# Patient Record
Sex: Male | Born: 1960
Health system: Southern US, Community
[De-identification: ages and names within clinical notes are randomized; demographics above are authoritative.]

## PROBLEM LIST (undated history)

## (undated) DIAGNOSIS — L0291 Cutaneous abscess, unspecified: Secondary | ICD-10-CM

## (undated) DIAGNOSIS — J45909 Unspecified asthma, uncomplicated: Secondary | ICD-10-CM

## (undated) DIAGNOSIS — I639 Cerebral infarction, unspecified: Secondary | ICD-10-CM

## (undated) DIAGNOSIS — E785 Hyperlipidemia, unspecified: Secondary | ICD-10-CM

## (undated) DIAGNOSIS — F419 Anxiety disorder, unspecified: Secondary | ICD-10-CM

## (undated) DIAGNOSIS — F329 Major depressive disorder, single episode, unspecified: Secondary | ICD-10-CM

## (undated) DIAGNOSIS — F32A Depression, unspecified: Secondary | ICD-10-CM

## (undated) DIAGNOSIS — J449 Chronic obstructive pulmonary disease, unspecified: Secondary | ICD-10-CM

## (undated) DIAGNOSIS — G500 Trigeminal neuralgia: Secondary | ICD-10-CM

## (undated) DIAGNOSIS — R42 Dizziness and giddiness: Secondary | ICD-10-CM

## (undated) DIAGNOSIS — M1990 Unspecified osteoarthritis, unspecified site: Secondary | ICD-10-CM

## (undated) DIAGNOSIS — G8929 Other chronic pain: Secondary | ICD-10-CM

## (undated) DIAGNOSIS — K120 Recurrent oral aphthae: Secondary | ICD-10-CM

## (undated) DIAGNOSIS — I10 Essential (primary) hypertension: Secondary | ICD-10-CM

## (undated) HISTORY — DX: Essential (primary) hypertension: I10

## (undated) HISTORY — DX: Recurrent oral aphthae: K12.0

## (undated) HISTORY — DX: Dizziness and giddiness: R42

## (undated) HISTORY — DX: Major depressive disorder, single episode, unspecified: F32.9

## (undated) HISTORY — DX: Anxiety disorder, unspecified: F41.9

## (undated) HISTORY — PX: TRIGEMINAL NERVE DECOMPRESSION: SHX2579

## (undated) HISTORY — PX: HAND RECONSTRUCTION: SHX1730

## (undated) HISTORY — DX: Cutaneous abscess, unspecified: L02.91

## (undated) HISTORY — DX: Cerebral infarction, unspecified: I63.9

## (undated) HISTORY — PX: CHOLECYSTECTOMY: SHX55

## (undated) HISTORY — DX: Depression, unspecified: F32.A

## (undated) HISTORY — DX: Other chronic pain: G89.29

## (undated) HISTORY — DX: Trigeminal neuralgia: G50.0

## (undated) HISTORY — DX: Hyperlipidemia, unspecified: E78.5

---

## 2010-07-21 ENCOUNTER — Encounter: Payer: Self-pay | Admitting: Physician Assistant

## 2011-05-08 DIAGNOSIS — J45909 Unspecified asthma, uncomplicated: Secondary | ICD-10-CM | POA: Diagnosis not present

## 2011-05-08 DIAGNOSIS — J019 Acute sinusitis, unspecified: Secondary | ICD-10-CM | POA: Diagnosis not present

## 2011-08-07 DIAGNOSIS — I10 Essential (primary) hypertension: Secondary | ICD-10-CM | POA: Diagnosis not present

## 2011-08-07 DIAGNOSIS — R5381 Other malaise: Secondary | ICD-10-CM | POA: Diagnosis not present

## 2011-08-07 DIAGNOSIS — R5383 Other fatigue: Secondary | ICD-10-CM | POA: Diagnosis not present

## 2011-09-18 DIAGNOSIS — J209 Acute bronchitis, unspecified: Secondary | ICD-10-CM | POA: Diagnosis not present

## 2011-11-03 DIAGNOSIS — Z23 Encounter for immunization: Secondary | ICD-10-CM | POA: Diagnosis not present

## 2011-11-08 DIAGNOSIS — E039 Hypothyroidism, unspecified: Secondary | ICD-10-CM | POA: Diagnosis not present

## 2011-11-08 DIAGNOSIS — E559 Vitamin D deficiency, unspecified: Secondary | ICD-10-CM | POA: Diagnosis not present

## 2011-11-08 DIAGNOSIS — M542 Cervicalgia: Secondary | ICD-10-CM | POA: Diagnosis not present

## 2011-11-08 DIAGNOSIS — R5381 Other malaise: Secondary | ICD-10-CM | POA: Diagnosis not present

## 2011-11-08 DIAGNOSIS — R5383 Other fatigue: Secondary | ICD-10-CM | POA: Diagnosis not present

## 2011-11-08 DIAGNOSIS — Z125 Encounter for screening for malignant neoplasm of prostate: Secondary | ICD-10-CM | POA: Diagnosis not present

## 2011-11-08 DIAGNOSIS — E785 Hyperlipidemia, unspecified: Secondary | ICD-10-CM | POA: Diagnosis not present

## 2011-11-08 DIAGNOSIS — I10 Essential (primary) hypertension: Secondary | ICD-10-CM | POA: Diagnosis not present

## 2011-12-26 DIAGNOSIS — R609 Edema, unspecified: Secondary | ICD-10-CM | POA: Diagnosis not present

## 2012-02-08 DIAGNOSIS — M545 Low back pain: Secondary | ICD-10-CM | POA: Diagnosis not present

## 2012-02-08 DIAGNOSIS — F411 Generalized anxiety disorder: Secondary | ICD-10-CM | POA: Diagnosis not present

## 2012-04-15 DIAGNOSIS — J019 Acute sinusitis, unspecified: Secondary | ICD-10-CM | POA: Diagnosis not present

## 2012-05-10 ENCOUNTER — Ambulatory Visit (INDEPENDENT_AMBULATORY_CARE_PROVIDER_SITE_OTHER): Payer: Medicare Other | Admitting: Physician Assistant

## 2012-05-10 ENCOUNTER — Encounter: Payer: Self-pay | Admitting: Physician Assistant

## 2012-05-10 VITALS — BP 138/85 | HR 54 | Temp 97.9°F | Ht 69.5 in | Wt 249.0 lb

## 2012-05-10 DIAGNOSIS — J45901 Unspecified asthma with (acute) exacerbation: Secondary | ICD-10-CM

## 2012-05-10 DIAGNOSIS — G500 Trigeminal neuralgia: Secondary | ICD-10-CM | POA: Diagnosis not present

## 2012-05-10 DIAGNOSIS — I10 Essential (primary) hypertension: Secondary | ICD-10-CM

## 2012-05-10 DIAGNOSIS — J309 Allergic rhinitis, unspecified: Secondary | ICD-10-CM | POA: Diagnosis not present

## 2012-05-10 DIAGNOSIS — F4323 Adjustment disorder with mixed anxiety and depressed mood: Secondary | ICD-10-CM

## 2012-05-10 DIAGNOSIS — S0431XS Injury of trigeminal nerve, right side, sequela: Secondary | ICD-10-CM

## 2012-05-10 MED ORDER — METHYLPREDNISOLONE ACETATE 80 MG/ML IJ SUSP
80.0000 mg | Freq: Once | INTRAMUSCULAR | Status: AC
  Administered 2012-05-10: 80 mg via INTRAMUSCULAR

## 2012-05-10 MED ORDER — HYDROCODONE-ACETAMINOPHEN 5-300 MG PO TABS: 5.0000 mg | ORAL_TABLET | Freq: Two times a day (BID) | ORAL | Status: DC | PRN

## 2012-05-10 MED ORDER — ALBUTEROL SULFATE HFA 108 (90 BASE) MCG/ACT IN AERS: 2.0000 | INHALATION_SPRAY | RESPIRATORY_TRACT | Status: DC | PRN

## 2012-05-10 NOTE — Progress Notes (Signed)
Tolerated well

## 2012-05-11 DIAGNOSIS — F4323 Adjustment disorder with mixed anxiety and depressed mood: Secondary | ICD-10-CM | POA: Insufficient documentation

## 2012-05-11 DIAGNOSIS — I10 Essential (primary) hypertension: Secondary | ICD-10-CM | POA: Insufficient documentation

## 2012-05-11 DIAGNOSIS — G500 Trigeminal neuralgia: Secondary | ICD-10-CM | POA: Insufficient documentation

## 2012-05-11 NOTE — Progress Notes (Signed)
  Subjective:    Patient ID: Matthew [name], male    DOB: [birth year], 52 y.o.   MRN: [id number]  HPI 3 month follow up on anxiety, trigeminal neuralgia pain,  Head congestion x3days, cough, shortness of breath    Review of Systems  Constitutional: Positive for fatigue.  HENT: Positive for ear pain, congestion, facial swelling, rhinorrhea, postnasal drip and sinus pressure. Negative for sneezing.   Eyes: Positive for itching.  Respiratory: Positive for cough and shortness of breath.   All other systems reviewed and are negative.       Objective:   Physical Exam  Vitals reviewed. Constitutional: He is oriented to person, place, and time. He appears well-developed and well-nourished.  HENT:  Head: Normocephalic and atraumatic.  Right Ear: External ear normal.  Left Ear: External ear normal.  Mouth/Throat: Oropharynx is clear and moist.  Right sided head neurological pain R>L TM retraction R>L nasal hypertrophy  Eyes: Conjunctivae are normal. Pupils are equal, round, and reactive to light.  Neck: Normal range of motion. Neck supple.  Cardiovascular: Normal rate, regular rhythm and normal heart sounds.   Pulmonary/Chest: Effort normal. No respiratory distress. He has wheezes. He has no rales.  Abdominal: Soft. Bowel sounds are normal.  Musculoskeletal: Normal range of motion.  Neurological: He is oriented to person, place, and time.  Skin: Skin is warm and dry.  Psychiatric: He has a normal mood and affect. His behavior is normal. Judgment and thought content normal.          Assessment & Plan:  Asthma with acute exacerbation - Plan: albuterol (PROAIR HFA) 108 (90 BASE) MCG/ACT inhaler  Trigeminal (5th) nerve injury, right, sequela - Plan: Hydrocodone-Acetaminophen 5-300 MG TABS  Allergic rhinitis - Plan: methylPREDNISolone acetate (DEPO-MEDROL) injection 80 mg  Adjustment disorder with mixed anxiety and depressed mood  Trigeminal neuralgia  HTN (hypertension)

## 2012-06-12 ENCOUNTER — Other Ambulatory Visit: Payer: Self-pay | Admitting: Nurse Practitioner

## 2012-06-17 ENCOUNTER — Ambulatory Visit (INDEPENDENT_AMBULATORY_CARE_PROVIDER_SITE_OTHER): Payer: Medicare Other | Admitting: General Practice

## 2012-06-17 VITALS — BP 148/84 | HR 52 | Temp 97.5°F

## 2012-06-17 DIAGNOSIS — S0431XS Injury of trigeminal nerve, right side, sequela: Secondary | ICD-10-CM

## 2012-06-17 DIAGNOSIS — S049XXS Injury of unspecified cranial nerve, sequela: Secondary | ICD-10-CM

## 2012-06-17 MED ORDER — HYDROCODONE-ACETAMINOPHEN 5-300 MG PO TABS: 5.0000 mg | ORAL_TABLET | Freq: Two times a day (BID) | ORAL | Status: DC | PRN

## 2012-06-17 NOTE — Patient Instructions (Addendum)
Trigeminal Neuralgia Trigeminal neuralgia is a nerve disorder that causes sudden attacks of severe facial pain. It is caused by damage to the trigeminal nerve, a major nerve in the face. It is more common in women and in the elderly, although it can also happen in younger patients. Attacks last from a few seconds to several minutes and can occur from a couple of times per year to several times per day. Trigeminal neuralgia can be a very distressing and disabling condition. Surgery may be needed in very severe cases if medical treatment does not give relief. HOME CARE INSTRUCTIONS   If your caregiver prescribed medication to help prevent attacks, take as directed.  To help prevent attacks:  Chew on the unaffected side of the mouth.  Avoid touching your face.  Avoid blasts of hot or cold air.  Men may wish to grow a beard to avoid having to shave. SEEK IMMEDIATE MEDICAL CARE IF:  Pain is unbearable and your medicine does not help.  You develop new, unexplained symptoms (problems).  You have problems that may be related to a medication you are taking. Document Released: 01/28/2000 Document Revised: 04/24/2011 Document Reviewed: 11/27/2008 [employer] Patient Information 2013 [employer].  

## 2012-06-17 NOTE — Progress Notes (Signed)
  Subjective:    Patient ID: Matthew [name], male    DOB: Sep 08, 1960, 52 y.o.   MRN: [id number]  HPI Reports today for medication refill. Hydrocodone/Acetaminophen 5-325 1 tablet every 12-24 hours as needed. Reports pain in legs and trigeminal neuralgia. Reports pain medications relieves pain completely. Reports taking medications as prescribed.     Review of Systems  Constitutional: Negative for fever and chills.  HENT:       Right facial pain  Respiratory: Negative for chest tightness and shortness of breath.   Cardiovascular: Negative for chest pain.  Genitourinary: Negative for difficulty urinating.  Musculoskeletal:       Bilateral leg pain  Neurological: Positive for headaches. Negative for dizziness.       From trigeminal neuralgia        Objective:   Physical Exam  Constitutional: He appears well-developed and well-nourished.  Cardiovascular: Normal rate, regular rhythm and normal heart sounds.   No murmur heard. Pulmonary/Chest: Effort normal and breath sounds normal. No respiratory distress. He exhibits no tenderness.  Neurological: He is alert.  Skin: Skin is warm and dry.  Psychiatric: He has a normal mood and affect.          Assessment & Plan:  Take medications as prescribed Schedule 3 month follow up  Continue current medications Patient verbalized understanding Raymon Mutton, FNP-C

## 2012-07-15 ENCOUNTER — Ambulatory Visit: Payer: Medicare Other | Admitting: General Practice

## 2012-07-16 ENCOUNTER — Other Ambulatory Visit: Payer: Self-pay | Admitting: Family Medicine

## 2012-07-18 NOTE — Telephone Encounter (Signed)
LAST OV 06/17/12. LAST RF 06/17/12. PLEASE PRINT CALL PT WHEN READY

## 2012-07-19 NOTE — Telephone Encounter (Signed)
Pt concerned about his pain rx  was due yesterday 6/5  [facility] on [facility]/16/12 Please advise

## 2012-07-23 NOTE — Telephone Encounter (Signed)
Done yesterday.

## 2012-08-13 ENCOUNTER — Other Ambulatory Visit: Payer: Self-pay | Admitting: Nurse Practitioner

## 2012-08-13 ENCOUNTER — Ambulatory Visit (INDEPENDENT_AMBULATORY_CARE_PROVIDER_SITE_OTHER): Payer: Medicare Other | Admitting: Family Medicine

## 2012-08-13 VITALS — BP 202/120 | HR 80 | Temp 97.4°F | Ht 69.5 in | Wt 250.0 lb

## 2012-08-13 DIAGNOSIS — I160 Hypertensive urgency: Secondary | ICD-10-CM

## 2012-08-13 DIAGNOSIS — I10 Essential (primary) hypertension: Secondary | ICD-10-CM

## 2012-08-13 DIAGNOSIS — R6889 Other general symptoms and signs: Secondary | ICD-10-CM | POA: Diagnosis not present

## 2012-08-13 DIAGNOSIS — Z79899 Other long term (current) drug therapy: Secondary | ICD-10-CM | POA: Diagnosis not present

## 2012-08-13 DIAGNOSIS — M7989 Other specified soft tissue disorders: Secondary | ICD-10-CM | POA: Diagnosis not present

## 2012-08-13 LAB — POCT URINALYSIS DIPSTICK
Blood, UA: NEGATIVE
Glucose, UA: NEGATIVE
Ketones, UA: NEGATIVE
Protein, UA: NEGATIVE
Spec Grav, UA: <=1.005
Urobilinogen, UA: NEGATIVE

## 2012-08-13 NOTE — Progress Notes (Signed)
  Subjective:    Patient ID: Matthew [name], male    DOB: [birth year], 52 y.o.   MRN: [id number]  HPI Pt presents with LE edema over past 2 weeks  Pt presents today with BP of 200/100 in bilateral arms on recheck.  NO HA, CP, SOB.  Has had some weakness Had dx of HTN many years ago.  Currently not on medication.  Has been taking NSAIDs.  Has been under stress.  No recent infections or trauma.  No ETOH abuse 1 PPD smoker.    Review of Systems  All other systems reviewed and are negative.       Objective:   Physical Exam  Constitutional: No distress.  Obese    HENT:  Head: Normocephalic and atraumatic.  Eyes: Conjunctivae are normal. Pupils are equal, round, and reactive to light.  Neck: Normal range of motion.  Cardiovascular: Normal rate.   Pulmonary/Chest: Effort normal and breath sounds normal.  Abdominal:  Obese abdomen, non tender    Musculoskeletal:  2-3 + pitting edema in LEs bilaterally    Neurological: He is alert.  Skin: Skin is warm.   EKG: RBBB, no acute TW abnormalities      Assessment & Plan:  Some concern for hypertensive emergency given BP and LE swelling (? HTN induced renal renal failure).  EKG w/ RBBB. Unclear if this is old vs. New.  Will give full dose ASA in the interim.  Will send pt to ER via EMS for further evaluation.

## 2012-08-15 ENCOUNTER — Other Ambulatory Visit: Payer: Self-pay | Admitting: *Deleted

## 2012-08-15 MED ORDER — PREGABALIN 200 MG PO CAPS: 200.0000 mg | ORAL_CAPSULE | Freq: Every day | ORAL | Status: DC

## 2012-08-15 MED ORDER — PREGABALIN 100 MG PO CAPS: 100.0000 mg | ORAL_CAPSULE | Freq: Three times a day (TID) | ORAL | Status: DC

## 2012-08-15 MED ORDER — HYDROCODONE-ACETAMINOPHEN 5-300 MG PO TABS: ORAL_TABLET | ORAL | Status: DC

## 2012-08-15 MED ORDER — TRAMADOL HCL 50 MG PO TABS: 50.0000 mg | ORAL_TABLET | Freq: Three times a day (TID) | ORAL | Status: DC | PRN

## 2012-08-15 NOTE — Telephone Encounter (Signed)
Last seen 08/13/12 by you, last filled 12/13 with 5 refills. If approved route to your nurse to call in at [facility]

## 2012-08-15 NOTE — Telephone Encounter (Addendum)
Patient last seen in office on 06-17-12. Rx for hydrocodone last filled on 6-9 for #60. Rx for lyrica 100 and 200 last filled on 07-16-12 for #90. Please advise. If approved please print and have pt pick up

## 2012-08-19 ENCOUNTER — Ambulatory Visit (INDEPENDENT_AMBULATORY_CARE_PROVIDER_SITE_OTHER): Payer: Medicare Other | Admitting: Family Medicine

## 2012-08-19 ENCOUNTER — Other Ambulatory Visit: Payer: Self-pay | Admitting: Nurse Practitioner

## 2012-08-19 ENCOUNTER — Encounter: Payer: Self-pay | Admitting: Family Medicine

## 2012-08-19 VITALS — BP 130/98 | HR 70 | Temp 97.7°F | Ht 69.5 in | Wt 254.0 lb

## 2012-08-19 DIAGNOSIS — Z79899 Other long term (current) drug therapy: Secondary | ICD-10-CM | POA: Diagnosis not present

## 2012-08-19 DIAGNOSIS — G894 Chronic pain syndrome: Secondary | ICD-10-CM | POA: Insufficient documentation

## 2012-08-19 DIAGNOSIS — J441 Chronic obstructive pulmonary disease with (acute) exacerbation: Secondary | ICD-10-CM | POA: Diagnosis not present

## 2012-08-19 DIAGNOSIS — G500 Trigeminal neuralgia: Secondary | ICD-10-CM | POA: Diagnosis not present

## 2012-08-19 DIAGNOSIS — M129 Arthropathy, unspecified: Secondary | ICD-10-CM

## 2012-08-19 DIAGNOSIS — I10 Essential (primary) hypertension: Secondary | ICD-10-CM | POA: Diagnosis not present

## 2012-08-19 DIAGNOSIS — M1990 Unspecified osteoarthritis, unspecified site: Secondary | ICD-10-CM

## 2012-08-19 LAB — POCT CBC
Granulocyte percent: 75.9 %G (ref 37–80)
HCT, POC: 47.2 % (ref 43.5–53.7)
Hemoglobin: 16.5 g/dL (ref 14.1–18.1)
Lymph, poc: 2.4 (ref 0.6–3.4)
MCH, POC: 31.1 pg (ref 27–31.2)
MCHC: 35 g/dL (ref 31.8–35.4)
MCV: 88.8 fL (ref 80–97)
MPV: 8.7 fL (ref 0–99.8)
POC Granulocyte: 8.4 — AB (ref 2–6.9)
POC LYMPH PERCENT: 21.5 %L (ref 10–50)
Platelet Count, POC: 173 10*3/uL (ref 142–424)
RBC: 5.3 M/uL (ref 4.69–6.13)
RDW, POC: 12.7 %
WBC: 11.1 10*3/uL — AB (ref 4.6–10.2)

## 2012-08-19 MED ORDER — PREGABALIN 200 MG PO CAPS: 200.0000 mg | ORAL_CAPSULE | Freq: Every day | ORAL | Status: DC

## 2012-08-19 MED ORDER — TRIAMTERENE-HCTZ 37.5-25 MG PO CAPS: 1.0000 | ORAL_CAPSULE | ORAL | Status: DC

## 2012-08-19 MED ORDER — HYDROCODONE-ACETAMINOPHEN 5-300 MG PO TABS: ORAL_TABLET | ORAL | Status: DC

## 2012-08-19 MED ORDER — CARBAMAZEPINE 200 MG PO TABS: 200.0000 mg | ORAL_TABLET | Freq: Four times a day (QID) | ORAL | Status: DC

## 2012-08-19 MED ORDER — ALBUTEROL SULFATE HFA 108 (90 BASE) MCG/ACT IN AERS: 2.0000 | INHALATION_SPRAY | RESPIRATORY_TRACT | Status: DC | PRN

## 2012-08-19 NOTE — Addendum Note (Signed)
Addended by: Prescott Gum on: 08/19/2012 04:39 PM   Modules accepted: Orders

## 2012-08-19 NOTE — Patient Instructions (Addendum)

## 2012-08-19 NOTE — Addendum Note (Signed)
Addended by: Orma Render F on: 08/19/2012 03:02 PM   Modules accepted: Orders

## 2012-08-19 NOTE — Progress Notes (Signed)
  Subjective:    Patient ID: Matthew [name], male    DOB: [birth year], 52 y.o.   MRN: [id number]  HPI  This 52 y.o. male presents for evaluation of hypertension.  He was seen last week for hypertensive emergency and he was seen in the ED at [facility] ED and he was rx'd xanax which he states didn't help and he resumed his tranxene which is helping.  He states he was having anxiety and panic attack and was not taking the tranxene.  He is needing refills on his medications. He has hx of trigeminal neuralgia has seen neurology in the past but is not currently seeing, he has hx of chronic pain, arthritis, HTN, and obesity.  Review of Systems No chest pain, SOB, HA, dizziness, vision change, N/V, diarrhea, constipation, dysuria, urinary urgency or frequency, myalgias, arthralgias or rash.     Objective:   Physical Exam Vital signs noted  Well developed well nourished male.  HEENT - Head atraumatic Normocephalic                Eyes - PERRLA, Conjuctiva - clear Sclera- Clear EOMI                Ears - EAC's Wnl TM's Wnl Gross Hearing WNL                Nose - Nares patent                 Throat - oropharanx wnl Respiratory - Lungs CTA bilateral Cardiac - RRR S1 and S2 without murmur GI - Abdomen soft Nontender and bowel sounds active x 4 Extremities - No edema. Neuro - Grossly intact.       Assessment & Plan:  Chronic pain syndrome - Plan: pregabalin (LYRICA) 200 MG capsule, Hydrocodone-Acetaminophen 5-300 MG TABS, Drug Screen, Urine.  He will follow up with clinical pharmacist for pain contract and management etc.  Explained that he will be rx'd the lortab and needs to follow up for refill.  Explained he will need to submit UDS for chronic narcotic pan medications.  He   Arthritis - Explained he can take aleve otc.  Essential hypertension, benign - Plan: triamterene-hydrochlorothiazide (DYAZIDE) 37.5-25 MG per capsule, POCT CBC, BASIC METABOLIC PANEL WITH GFR.  BP under better control. Follow  up in one month and prn.  Trigeminal neuralgia - Plan: carbamazepine (TEGRETOL) 200 MG tablet, Carbamazepine level, free, POCT CBC, BASIC METABOLIC PANEL WITH GFR  COPD exacerbation - Plan: albuterol (PROAIR HFA) 108 (90 BASE) MCG/ACT inhaler, discussed need to quit smoking.

## 2012-08-19 NOTE — Telephone Encounter (Signed)
This was refilled today at his visit.

## 2012-08-20 LAB — PRESCRIPTION ABUSE MONITORING 17P, URINE
Barbiturate Screen, Urine: NEGATIVE ng/mL
Cocaine Metabolites: NEGATIVE ng/mL
Fentanyl, Ur: NEGATIVE ng/mL
MDMA URINE: NEGATIVE ng/mL
Meperidine, Ur: NEGATIVE ng/mL
Tapentadol, urine: NEGATIVE ng/mL
Tramadol Scrn, Ur: NEGATIVE ng/mL
Zolpidem, Urine: NEGATIVE ng/mL

## 2012-08-20 LAB — BASIC METABOLIC PANEL WITH GFR
BUN: 10 mg/dL (ref 6–23)
CO2: 28 mEq/L (ref 19–32)
Calcium: 9.7 mg/dL (ref 8.4–10.5)
Chloride: 96 mEq/L (ref 96–112)
Creat: 0.78 mg/dL (ref 0.50–1.35)
GFR, Est African American: >89 mL/min
GFR, Est Non African American: >89 mL/min
Glucose, Bld: 99 mg/dL (ref 70–99)
Potassium: 4.5 mEq/L (ref 3.5–5.3)
Sodium: 132 mEq/L — ABNORMAL LOW (ref 135–145)

## 2012-08-21 ENCOUNTER — Inpatient Hospital Stay (HOSPITAL_COMMUNITY)
Admission: EM | Admit: 2012-08-21 | Discharge: 2012-08-23 | DRG: 153 | Disposition: A | Discharge status: Home / Self Care | Payer: Medicare Other | Attending: Internal Medicine | Admitting: Internal Medicine

## 2012-08-21 ENCOUNTER — Encounter (HOSPITAL_COMMUNITY): Payer: Self-pay | Admitting: *Deleted

## 2012-08-21 ENCOUNTER — Observation Stay (HOSPITAL_COMMUNITY): Payer: Medicare Other

## 2012-08-21 ENCOUNTER — Emergency Department (HOSPITAL_COMMUNITY): Payer: Medicare Other

## 2012-08-21 DIAGNOSIS — F329 Major depressive disorder, single episode, unspecified: Secondary | ICD-10-CM | POA: Diagnosis present

## 2012-08-21 DIAGNOSIS — S0990XA Unspecified injury of head, initial encounter: Secondary | ICD-10-CM | POA: Diagnosis not present

## 2012-08-21 DIAGNOSIS — K2970 Gastritis, unspecified, without bleeding: Secondary | ICD-10-CM | POA: Diagnosis not present

## 2012-08-21 DIAGNOSIS — G500 Trigeminal neuralgia: Secondary | ICD-10-CM | POA: Diagnosis not present

## 2012-08-21 DIAGNOSIS — F411 Generalized anxiety disorder: Secondary | ICD-10-CM | POA: Diagnosis present

## 2012-08-21 DIAGNOSIS — J020 Streptococcal pharyngitis: Secondary | ICD-10-CM | POA: Diagnosis not present

## 2012-08-21 DIAGNOSIS — R531 Weakness: Secondary | ICD-10-CM

## 2012-08-21 DIAGNOSIS — G40909 Epilepsy, unspecified, not intractable, without status epilepticus: Secondary | ICD-10-CM | POA: Diagnosis present

## 2012-08-21 DIAGNOSIS — F4323 Adjustment disorder with mixed anxiety and depressed mood: Secondary | ICD-10-CM

## 2012-08-21 DIAGNOSIS — R6889 Other general symptoms and signs: Secondary | ICD-10-CM | POA: Diagnosis not present

## 2012-08-21 DIAGNOSIS — D72829 Elevated white blood cell count, unspecified: Secondary | ICD-10-CM | POA: Diagnosis present

## 2012-08-21 DIAGNOSIS — E871 Hypo-osmolality and hyponatremia: Secondary | ICD-10-CM

## 2012-08-21 DIAGNOSIS — J449 Chronic obstructive pulmonary disease, unspecified: Secondary | ICD-10-CM | POA: Diagnosis present

## 2012-08-21 DIAGNOSIS — M25569 Pain in unspecified knee: Secondary | ICD-10-CM | POA: Diagnosis not present

## 2012-08-21 DIAGNOSIS — E785 Hyperlipidemia, unspecified: Secondary | ICD-10-CM | POA: Diagnosis present

## 2012-08-21 DIAGNOSIS — F17200 Nicotine dependence, unspecified, uncomplicated: Secondary | ICD-10-CM | POA: Diagnosis present

## 2012-08-21 DIAGNOSIS — W010XXA Fall on same level from slipping, tripping and stumbling without subsequent striking against object, initial encounter: Secondary | ICD-10-CM | POA: Diagnosis present

## 2012-08-21 DIAGNOSIS — R1110 Vomiting, unspecified: Secondary | ICD-10-CM | POA: Diagnosis not present

## 2012-08-21 DIAGNOSIS — J4489 Other specified chronic obstructive pulmonary disease: Secondary | ICD-10-CM | POA: Diagnosis present

## 2012-08-21 DIAGNOSIS — I10 Essential (primary) hypertension: Secondary | ICD-10-CM | POA: Diagnosis not present

## 2012-08-21 DIAGNOSIS — F3289 Other specified depressive episodes: Secondary | ICD-10-CM | POA: Diagnosis present

## 2012-08-21 DIAGNOSIS — J438 Other emphysema: Secondary | ICD-10-CM | POA: Diagnosis not present

## 2012-08-21 DIAGNOSIS — S8990XA Unspecified injury of unspecified lower leg, initial encounter: Secondary | ICD-10-CM | POA: Diagnosis not present

## 2012-08-21 DIAGNOSIS — T1490XA Injury, unspecified, initial encounter: Secondary | ICD-10-CM | POA: Diagnosis present

## 2012-08-21 DIAGNOSIS — G894 Chronic pain syndrome: Secondary | ICD-10-CM

## 2012-08-21 HISTORY — DX: Unspecified osteoarthritis, unspecified site: M19.90

## 2012-08-21 HISTORY — DX: Chronic obstructive pulmonary disease, unspecified: J44.9

## 2012-08-21 HISTORY — DX: Unspecified asthma, uncomplicated: J45.909

## 2012-08-21 LAB — CBC WITH DIFFERENTIAL/PLATELET
Basophils Absolute: 0 10*3/uL (ref 0.0–0.1)
Basophils Relative: 0 % (ref 0–1)
Eosinophils Absolute: 0 10*3/uL (ref 0.0–0.7)
Eosinophils Relative: 0 % (ref 0–5)
Lymphs Abs: 0.7 10*3/uL (ref 0.7–4.0)
MCH: 31.7 pg (ref 26.0–34.0)
MCV: 87.4 fL (ref 78.0–100.0)
Neutrophils Relative %: 90 % — ABNORMAL HIGH (ref 43–77)
Platelets: 141 10*3/uL — ABNORMAL LOW (ref 150–400)
RBC: 4.86 MIL/uL (ref 4.22–5.81)
RDW: 12.7 % (ref 11.5–15.5)

## 2012-08-21 LAB — COMPREHENSIVE METABOLIC PANEL
ALT: 20 U/L (ref 0–53)
AST: 13 U/L (ref 0–37)
Albumin: 3.5 g/dL (ref 3.5–5.2)
Alkaline Phosphatase: 111 U/L (ref 39–117)
Calcium: 8.7 mg/dL (ref 8.4–10.5)
GFR calc Af Amer: >90 mL/min (ref >90)
Potassium: 3.7 mEq/L (ref 3.5–5.1)
Sodium: 127 mEq/L — ABNORMAL LOW (ref 135–145)
Total Protein: 7.5 g/dL (ref 6.0–8.3)

## 2012-08-21 LAB — URINALYSIS, ROUTINE W REFLEX MICROSCOPIC
Hgb urine dipstick: NEGATIVE
Ketones, ur: NEGATIVE mg/dL
Specific Gravity, Urine: 1.02 (ref 1.005–1.030)
Urobilinogen, UA: 0.2 mg/dL (ref 0.0–1.0)

## 2012-08-21 LAB — RAPID URINE DRUG SCREEN, HOSP PERFORMED
Amphetamines: NOT DETECTED
Barbiturates: NOT DETECTED
Benzodiazepines: POSITIVE — AB
Cocaine: NOT DETECTED
Opiates: NOT DETECTED
Tetrahydrocannabinol: NOT DETECTED

## 2012-08-21 LAB — OPIATES/OPIOIDS (LC/MS-MS)
Codeine Urine: NEGATIVE ng/mL
Hydromorphone: NEGATIVE ng/mL
Morphine Urine: NEGATIVE ng/mL
Norhydrocodone, Ur: 393 ng/mL
Oxycodone, ur: NEGATIVE ng/mL

## 2012-08-21 LAB — BENZODIAZEPINES (GC/LC/MS), URINE
Alprazolam metabolite (GC/LC/MS), ur confirm: NEGATIVE ng/mL
Diazepam (GC/LC/MS), ur confirm: NEGATIVE ng/mL
Estazolam (GC/LC/MS), ur confirm: NEGATIVE ng/mL
Midazolam (GC/LC/MS), ur confirm: NEGATIVE ng/mL
Oxazepam (GC/LC/MS), ur confirm: 2056 ng/mL
Triazolam metabolite (GC/LC/MS), ur confirm: NEGATIVE ng/mL

## 2012-08-21 LAB — URINE MICROSCOPIC-ADD ON

## 2012-08-21 LAB — CBC
HCT: 41.7 % (ref 39.0–52.0)
MCH: 30.6 pg (ref 26.0–34.0)
MCV: 89.1 fL (ref 78.0–100.0)
Platelets: 146 10*3/uL — ABNORMAL LOW (ref 150–400)
RDW: 13 % (ref 11.5–15.5)

## 2012-08-21 MED ORDER — PREGABALIN 50 MG PO CAPS
100.0000 mg | ORAL_CAPSULE | Freq: Three times a day (TID) | ORAL | Status: DC
  Administered 2012-08-21 – 2012-08-23 (×5): 100 mg via ORAL
  Filled 2012-08-21 (×7): qty 2

## 2012-08-21 MED ORDER — PNEUMOCOCCAL VAC POLYVALENT 25 MCG/0.5ML IJ INJ
0.5000 mL | INJECTION | INTRAMUSCULAR | Status: AC
  Administered 2012-08-22: 0.5 mL via INTRAMUSCULAR
  Filled 2012-08-21: qty 0.5

## 2012-08-21 MED ORDER — ENOXAPARIN SODIUM 40 MG/0.4ML ~~LOC~~ SOLN
40.0000 mg | SUBCUTANEOUS | Status: DC
  Administered 2012-08-21 – 2012-08-22 (×2): 40 mg via SUBCUTANEOUS
  Filled 2012-08-21 (×4): qty 0.4

## 2012-08-21 MED ORDER — ALUM & MAG HYDROXIDE-SIMETH 200-200-20 MG/5ML PO SUSP
30.0000 mL | Freq: Four times a day (QID) | ORAL | Status: DC | PRN
  Administered 2012-08-21: 30 mL via ORAL
  Filled 2012-08-21: qty 30

## 2012-08-21 MED ORDER — CARBAMAZEPINE 200 MG PO TABS
200.0000 mg | ORAL_TABLET | Freq: Four times a day (QID) | ORAL | Status: DC
  Administered 2012-08-21 – 2012-08-23 (×7): 200 mg via ORAL
  Filled 2012-08-21 (×11): qty 1

## 2012-08-21 MED ORDER — ALBUTEROL SULFATE HFA 108 (90 BASE) MCG/ACT IN AERS: 2.0000 | INHALATION_SPRAY | RESPIRATORY_TRACT | Status: DC | PRN

## 2012-08-21 MED ORDER — ONDANSETRON HCL 4 MG/2ML IJ SOLN
4.0000 mg | Freq: Once | INTRAMUSCULAR | Status: AC
  Administered 2012-08-21: 4 mg via INTRAVENOUS
  Filled 2012-08-21: qty 2

## 2012-08-21 MED ORDER — AMLODIPINE BESYLATE 5 MG PO TABS
5.0000 mg | ORAL_TABLET | Freq: Every day | ORAL | Status: DC
  Administered 2012-08-21 – 2012-08-23 (×3): 5 mg via ORAL
  Filled 2012-08-21 (×3): qty 1

## 2012-08-21 MED ORDER — PREGABALIN 75 MG PO CAPS
200.0000 mg | ORAL_CAPSULE | Freq: Every day | ORAL | Status: DC
  Administered 2012-08-21 – 2012-08-22 (×2): 200 mg via ORAL
  Filled 2012-08-21: qty 4

## 2012-08-21 MED ORDER — SODIUM CHLORIDE 0.9 % IV SOLN
INTRAVENOUS | Status: DC
  Administered 2012-08-21 – 2012-08-23 (×3): via INTRAVENOUS

## 2012-08-21 MED ORDER — SODIUM CHLORIDE 0.9 % IV BOLUS (SEPSIS)
1000.0000 mL | Freq: Once | INTRAVENOUS | Status: AC
  Administered 2012-08-21: 1000 mL via INTRAVENOUS

## 2012-08-21 NOTE — ED Notes (Signed)
Per EMS- pt had a fall (denies LOC or hitting head) and 1 episode of vomiting today. Pt also took Vicodin this morning. Pt started new BP medication recently as well and reports not feeling well since taking this. cbg 185. 4mg  zofran en route.

## 2012-08-21 NOTE — H&P (Signed)
[facility] History and Physical  KEATYN JAWAD WUJ:[id number] DOB: November 10, 1960 DOA: 08/21/2012  Referring physician: Alroy Dust PCP: Rudi Heap, MD    Chief Complaint: generalized weakness, fall today.   HPI: Matthew [name] is a 52 y.o. male with long h/o trigeminal neuralgia, recently diagnosed hypertension, was started on dyazide , since then, pt reports feeling dizzy, fatigued to the point of a fall today with abrasions over the right knee. He denies chest pain or sob, or palpitations. No orthopnea or PND. No focal weakness, he reported pedal edema which has resolved witht he new anti hypertensive. On arrival to ED, he was found to be hyponatremic and dehydrated. His WBC count was 21,000. He denies any diarrhea. He reports 2 episodes of vomiting and slight dizziness. He is being admitted to medical service for further evaluation.   Review of Systems: The patient denies anorexia, fever, weight loss,, vision loss, decreased hearing, hoarseness, chest pain, syncope, dyspnea on exertion, peripheral edema, balance deficits, hemoptysis, abdominal pain, melena, hematochezia, severe indigestion/heartburn, hematuria, incontinence, genital sores, muscle weakness, suspicious skin lesions, transient blindness, difficulty walking, depression, unusual weight change, abnormal bleeding,, angioedema, and breast masses.    Past Medical History  Diagnosis Date  . Anxiety   . Chronic pain   . Abscess   . Aphthous ulcer   . Seizure disorder   . Chronic bronchitis   . Depression   . Hypertension   . Vertigo   . Trigeminal neuralgia   . Seizures   . Hyperlipidemia   . COPD (chronic obstructive pulmonary disease)    Past Surgical History  Procedure Laterality Date  . Hand surgery Right    Social History:  reports that he has been smoking Cigarettes.  He has been smoking about 1.00 pack per day. He does not have any smokeless tobacco history on file. He reports that he does not drink alcohol or use  illicit drugs. where does patient live--home,   Allergies  Allergen Reactions  . Penicillins     Family History  Problem Relation Age of Onset  . Hypertension Mother   . Diabetes Mother   . Hypertension Father   . Diabetes Father     Prior to Admission medications   Medication Sig Start Date End Date Taking? Authorizing Provider  albuterol (PROVENTIL HFA;VENTOLIN HFA) 108 (90 BASE) MCG/ACT inhaler Inhale 2 puffs into the lungs every 4 (four) hours as needed for wheezing or shortness of breath. 08/19/12  Yes Deatra Canter, FNP  carbamazepine (TEGRETOL) 200 MG tablet Take 200 mg by mouth 4 (four) times daily. 08/19/12  Yes Deatra Canter, FNP  clorazepate (TRANXENE) 7.5 MG tablet Take 7.5 mg by mouth every 6 (six) hours as needed for anxiety.    Yes Historical Provider, MD  Hydrocodone-Acetaminophen 5-300 MG TABS Take 1 tablet by mouth every 6 (six) hours as needed (Pain).    Yes Historical Provider, MD  pregabalin (LYRICA) 100 MG capsule Take 1 capsule (100 mg total) by mouth 3 (three) times daily. 08/15/12  Yes Mae Shelda Altes, FNP  pregabalin (LYRICA) 200 MG capsule Take 1 capsule (200 mg total) by mouth at bedtime. 08/19/12  Yes Deatra Canter, FNP  traMADol (ULTRAM) 50 MG tablet Take 50 mg by mouth every 8 (eight) hours as needed for pain. 08/15/12  Yes Mae Shelda Altes, FNP  triamterene-hydrochlorothiazide (DYAZIDE) 37.5-25 MG per capsule Take 1 each (1 capsule total) by mouth every morning. 08/19/12  Yes Deatra Canter, FNP  Physical Exam: Filed Vitals:   08/21/12 1506 08/21/12 1512 08/21/12 1515 08/21/12 1713  BP: 146/114  126/70 143/81  Pulse: 68 69 67 65  Temp:    97.8 F (36.6 C)  TempSrc:    Oral  Resp:  14 18 17   SpO2: 94% 98% 95% 99%    Constitutional: Vital signs reviewed.  Patient is a well-developed and well-nourished  in no acute distress and cooperative with exam. Alert and oriented x3.  Head: Normocephalic and atraumatic Mouth: no erythema or exudates,  MMM Eyes: PERRL, EOMI, conjunctivae normal, No scleral icterus.  Neck: Supple, Trachea midline normal ROM, No JVD, mass, thyromegaly, or carotid bruit present.  Cardiovascular: RRR, S1 normal, S2 normal, no MRG, pulses symmetric and intact bilaterally Pulmonary/Chest: normal respiratory effort, CTAB, no wheezes, rales, or rhonchi Abdominal: Soft. Non-tender, non-distended, bowel sounds are normal, no masses, organomegaly, or guarding present.  Musculoskeletal: skin bruising over the rightknee.  Neurological: A&O x3, Strength is normal and symmetric bilaterally, cranial nerve II-XII are grossly intact, no focal motor deficit, sensory intact to light touch bilaterally.      Labs on Admission:  Basic Metabolic Panel:  Recent Labs Lab 08/19/12 1500 08/21/12 1205  NA 132* 127*  K 4.5 3.7  CL 96 90*  CO2 28 26  GLUCOSE 99 161*  BUN 10 10  CREATININE 0.78 0.72  CALCIUM 9.7 8.7   Liver Function Tests:  Recent Labs Lab 08/21/12 1205  AST 13  ALT 20  ALKPHOS 111  BILITOT 0.5  PROT 7.5  ALBUMIN 3.5    Recent Labs Lab 08/21/12 1205  LIPASE 12   No results found for this basename: AMMONIA,  in the last 168 hours CBC:  Recent Labs Lab 08/19/12 1519 08/21/12 1205  WBC 11.1* 21.6*  NEUTROABS  --  19.5*  HGB 16.5 15.4  HCT 47.2 42.5  MCV 88.8 87.4  PLT  --  141*   Cardiac Enzymes: No results found for this basename: CKTOTAL, CKMB, CKMBINDEX, TROPONINI,  in the last 168 hours  BNP (last 3 results)  Recent Labs  08/21/12 1439  PROBNP 137.4*   CBG: No results found for this basename: GLUCAP,  in the last 168 hours  Radiological Exams on Admission: Dg Chest 2 View  08/21/2012   *RADIOLOGY REPORT*  Clinical Data: Chest pain and shortness of breath.  CHEST - 2 VIEW  Comparison: Two-view chest 10/14/2009.  Findings: The heart size is normal.  Mild pulmonary vascular congestion is evident.  Emphysematous changes are again noted. Minimal bibasilar atelectasis is  evident.  No significant airspace consolidation is present.  IMPRESSION:  1.  Mild pulmonary vascular congestion. 2.  No other acute abnormality. 3.  Stable emphysema and chronic interstitial coarsening.   Original Report Authenticated By: Marin Roberts, M.D.   Dg Knee Complete 4 Views Right  08/21/2012   *RADIOLOGY REPORT*  Clinical Data: Anterior knee pain after fall.  RIGHT KNEE - COMPLETE 4+ VIEW  Comparison: None.  Findings: No fracture or dislocation.  The minimal irregularity of the fibular head on one-view is probably related to projection rather than fracture.  Question the patient is tender here.  No findings of joint effusion.  IMPRESSION: No fracture or dislocation.  The minimal irregularity of the fibular head on one-view is probably related to projection rather than fracture.  Question the patient is tender here.  No findings of joint effusion   Original Report Authenticated By: Lacy Duverney, M.D.    EKG: RBBB, OLD  from one week ago.   Assessment/Plan Active Problems:  1.  Generalized weakness, dehydration: possibly secondary to hyponatremia from the thiazide diuretic. Will stop diazide , hydrate the patient and recheck the sodium in am.   2. Hypertension: start low dose amlodipine.   3. Leukocytosis: unclear etiology, he is afebrile, cxr, does not show pneumonia. UA does not show infection and he is asymptomatic. No diarrhea. Will check his throat.   4. Trigeminal neuralgia: resume home medications.   5. COPD; stable not wheezing currently.   Code Status: full code Family Communication: none at bedside Disposition Plan: pending.   Time spent: 65 min  Paighton Godette [facility] Pager [phone]  If 7PM-7AM, please contact night-coverage [email or web address] Password [facility] 08/21/2012, 5:37 PM

## 2012-08-21 NOTE — ED Provider Notes (Signed)
History    CSN: [id number] Arrival date & time 08/21/12  1049  First MD Initiated Contact with Patient 08/21/12 1104     Chief Complaint  Patient presents with  . Emesis   (Consider location/radiation/quality/duration/timing/severity/associated sxs/prior Treatment) Patient is a 52 y.o. male presenting with vomiting. The history is provided by the patient.  Emesis  patient here complaining of sun onset of dizziness and lightheadedness after taken Vicodin on an empty stomach. The gas and abdominal cramping with vomiting x2. Feels better at this time. Denies any other drug ingestions. No chest pain chest pressure. No shortness of breath. Denies any head or neck trauma or pain at this time. EMS was called and CBG was 95. Patient given Zofran 4 mg and transported here. Patient also recently started on a new blood pressure medication Past Medical History  Diagnosis Date  . Anxiety   . Chronic pain   . Abscess   . Aphthous ulcer   . Seizure disorder   . Chronic bronchitis   . Depression   . Hypertension   . Vertigo   . Trigeminal neuralgia   . Seizures   . Hyperlipidemia   . COPD (chronic obstructive pulmonary disease)    Past Surgical History  Procedure Laterality Date  . Hand surgery Right    Family History  Problem Relation Age of Onset  . Hypertension Mother   . Diabetes Mother   . Hypertension Father   . Diabetes Father    History  Substance Use Topics  . Smoking status: Current Every Day Smoker -- 1.00 packs/day    Types: Cigarettes  . Smokeless tobacco: Not on file  . Alcohol Use: No    Review of Systems  Gastrointestinal: Positive for vomiting.  All other systems reviewed and are negative.    Allergies  Penicillins  Home Medications   Current Outpatient Rx  Name  Route  Sig  Dispense  Refill  . Hydrocodone-Acetaminophen 5-300 MG TABS   Oral   Take 1 tablet by mouth 2 (two) times daily as needed (Pain).         Marland Kitchen albuterol (PROAIR HFA) 108 (90  BASE) MCG/ACT inhaler   Inhalation   Inhale 2 puffs into the lungs every 4 (four) hours as needed for wheezing.   8.5 each   3   . carbamazepine (TEGRETOL) 200 MG tablet   Oral   Take 1 tablet (200 mg total) by mouth 4 (four) times daily. Take two tablets by mouth   240 tablet   3   . clorazepate (TRANXENE) 7.5 MG tablet   Oral   Take 7.5 mg by mouth. Take one tablet every 6-8 hours as needed         . pregabalin (LYRICA) 100 MG capsule   Oral   Take 1 capsule (100 mg total) by mouth 3 (three) times daily.   90 capsule   0   . pregabalin (LYRICA) 200 MG capsule   Oral   Take 1 capsule (200 mg total) by mouth at bedtime.   30 capsule   3   . traMADol (ULTRAM) 50 MG tablet   Oral   Take 1 tablet (50 mg total) by mouth every 8 (eight) hours as needed.   30 tablet   0   . triamterene-hydrochlorothiazide (DYAZIDE) 37.5-25 MG per capsule   Oral   Take 1 each (1 capsule total) by mouth every morning.   30 capsule   3    BP 124/84  Pulse 65  Temp(Src) 97.7 F (36.5 C) (Oral)  Resp 14  SpO2 91% Physical Exam  Nursing note and vitals reviewed. Constitutional: He is oriented to person, place, and time. He appears well-developed and well-nourished.  Non-toxic appearance. No distress.  HENT:  Head: Normocephalic and atraumatic.  Eyes: Conjunctivae, EOM and lids are normal. Pupils are equal, round, and reactive to light.  Neck: Normal range of motion. Neck supple. No tracheal deviation present. No mass present.  Cardiovascular: Normal rate, regular rhythm and normal heart sounds.  Exam reveals no gallop.   No murmur heard. Pulmonary/Chest: Effort normal and breath sounds normal. No stridor. No respiratory distress. He has no decreased breath sounds. He has no wheezes. He has no rhonchi. He has no rales.  Abdominal: Soft. Normal appearance and bowel sounds are normal. He exhibits no distension. There is no tenderness. There is no rebound and no CVA tenderness.   Musculoskeletal: Normal range of motion. He exhibits no edema and no tenderness.  Neurological: He is alert and oriented to person, place, and time. He has normal strength. No cranial nerve deficit or sensory deficit. GCS eye subscore is 4. GCS verbal subscore is 5. GCS motor subscore is 6.  Skin: Skin is warm and dry. No abrasion and no rash noted.  Psychiatric: He has a normal mood and affect. His speech is normal and behavior is normal.    ED Course  Procedures (including critical care time) Labs Reviewed  CBC WITH DIFFERENTIAL  COMPREHENSIVE METABOLIC PANEL  LIPASE, BLOOD  ETHANOL  URINE RAPID DRUG SCREEN (HOSP PERFORMED)   No results found. No diagnosis found.  MDM   Date: 08/21/2012  Rate: 70   Rhythm: normal sinus rhythm  QRS Axis: normal  Intervals: normal  ST/T Wave abnormalities: nonspecific ST changes  Conduction Disutrbances:right bundle branch block  Narrative Interpretation:   Old EKG Reviewed: unchanged   3:47 PM Patient given IV fluids here prior to seeing his chest x-ray. Patient has no evidence of CHF at this time as his BNP is normal. Patient did have a transient episode of hypoxemia but when you speak with him his pulse oximetry is 95% on room air. His mild hyponatremia is noted. Does have a leukocytosis. He continues to of the week which is worse after he takes his medications. I will contact [facility] for admission    Toy Baker, MD 08/21/12 1549

## 2012-08-21 NOTE — ED Notes (Signed)
Pt noted to have abrasion to left knee. Pt reports this occurred after fall today.

## 2012-08-21 NOTE — ED Notes (Signed)
Attempted to collect urine again. Patient unable to urinate.

## 2012-08-22 DIAGNOSIS — J020 Streptococcal pharyngitis: Principal | ICD-10-CM

## 2012-08-22 DIAGNOSIS — I10 Essential (primary) hypertension: Secondary | ICD-10-CM | POA: Diagnosis not present

## 2012-08-22 DIAGNOSIS — E871 Hypo-osmolality and hyponatremia: Secondary | ICD-10-CM | POA: Diagnosis not present

## 2012-08-22 DIAGNOSIS — G894 Chronic pain syndrome: Secondary | ICD-10-CM | POA: Diagnosis not present

## 2012-08-22 DIAGNOSIS — R531 Weakness: Secondary | ICD-10-CM

## 2012-08-22 LAB — CBC WITH DIFFERENTIAL/PLATELET
Basophils Relative: 0 % (ref 0–1)
HCT: 42.6 % (ref 39.0–52.0)
Hemoglobin: 14.7 g/dL (ref 13.0–17.0)
Lymphs Abs: 2 10*3/uL (ref 0.7–4.0)
MCH: 30.6 pg (ref 26.0–34.0)
MCHC: 34.5 g/dL (ref 30.0–36.0)
Monocytes Absolute: 1.2 10*3/uL — ABNORMAL HIGH (ref 0.1–1.0)
Monocytes Relative: 8 % (ref 3–12)
Neutro Abs: 11.6 10*3/uL — ABNORMAL HIGH (ref 1.7–7.7)
Neutrophils Relative %: 78 % — ABNORMAL HIGH (ref 43–77)
RBC: 4.81 MIL/uL (ref 4.22–5.81)

## 2012-08-22 LAB — BASIC METABOLIC PANEL
BUN: 9 mg/dL (ref 6–23)
Chloride: 94 mEq/L — ABNORMAL LOW (ref 96–112)
GFR calc Af Amer: >90 mL/min (ref >90)
GFR calc non Af Amer: >90 mL/min (ref >90)
Glucose, Bld: 149 mg/dL — ABNORMAL HIGH (ref 70–99)
Potassium: 4.2 mEq/L (ref 3.5–5.1)
Sodium: 135 mEq/L (ref 135–145)

## 2012-08-22 LAB — CARBAMAZEPINE, FREE AND TOTAL
Carbamazepine Metabolite/: 0.4 ug/mL
Carbamazepine Metabolite: 1.4 ug/mL — ABNORMAL HIGH (ref 0.1–1.0)
Carbamazepine, Bound: 2.9 ug/mL
Carbamazepine, Total: 4.7 ug/mL (ref 4.0–12.0)

## 2012-08-22 MED ORDER — CEPHALEXIN 500 MG PO CAPS
500.0000 mg | ORAL_CAPSULE | Freq: Two times a day (BID) | ORAL | Status: DC
  Administered 2012-08-22 – 2012-08-23 (×3): 500 mg via ORAL
  Filled 2012-08-22 (×4): qty 1

## 2012-08-22 MED ORDER — CLORAZEPATE DIPOTASSIUM 3.75 MG PO TABS
7.5000 mg | ORAL_TABLET | Freq: Three times a day (TID) | ORAL | Status: DC | PRN
  Administered 2012-08-23: 7.5 mg via ORAL
  Filled 2012-08-22: qty 2

## 2012-08-22 NOTE — Progress Notes (Addendum)
[facility] PROGRESS NOTE  Matthew [name] ZOX:[id number] DOB: May 17, 1960 DOA: 08/21/2012 PCP: Rudi Heap, MD  Assessment/Plan: 1. Strep throat- keflex 500mg  BID- no sign of abscess 2. Hyponatremia- IVF, recheck labs this AM, ? Dehydration vs HCTZ 3. HTN- d/c HCTZ, norvasc started by admitting dr 4. Leukocytosis- monitor with start of abx 5. Chronic pain/trigeminal neuralgia- continue home mediations 6. anticipate d/c in AM  Code Status: full Family Communication: patient Disposition Plan: home in AM   Consultants:  none  Procedures:  Strep screen +  Antibiotics:  keflex  HPI/Subjective: Feeling better but still weak- c/o sore throat Strep screen positive  Objective: Filed Vitals:   08/21/12 1713 08/21/12 2232 08/22/12 0542 08/22/12 0900  BP: 143/81 144/91 115/67   Pulse: 65 63 60   Temp: 97.8 F (36.6 C) 98.4 F (36.9 C) 98.8 F (37.1 C)   TempSrc: Oral Oral Oral   Resp: 17 18 18    Height:    5\' 9"  (1.753 m)  Weight:    113.399 kg (250 lb)  SpO2: 99% 99% 95%     Intake/Output Summary (Last 24 hours) at 08/22/12 0951 Last data filed at 08/22/12 0646  Gross per 24 hour  Intake 2002.08 ml  Output      0 ml  Net 2002.08 ml   Filed Weights   08/22/12 0900  Weight: 113.399 kg (250 lb)    Exam:   General:  A+Ox3, NAD  Cardiovascular: rrr  Respiratory: clear  Abdomen: obese, +BS  Musculoskeletal: moves all 4 ext   Data Reviewed: Basic Metabolic Panel:  Recent Labs Lab 08/19/12 1500 08/21/12 1205 08/21/12 1746  NA 132* 127*  --   K 4.5 3.7  --   CL 96 90*  --   CO2 28 26  --   GLUCOSE 99 161*  --   BUN 10 10  --   CREATININE 0.78 0.72 0.76  CALCIUM 9.7 8.7  --    Liver Function Tests:  Recent Labs Lab 08/21/12 1205  AST 13  ALT 20  ALKPHOS 111  BILITOT 0.5  PROT 7.5  ALBUMIN 3.5    Recent Labs Lab 08/21/12 1205  LIPASE 12   No results found for this basename: AMMONIA,  in the last 168 hours CBC:  Recent  Labs Lab 08/21/12 1205 08/21/12 1746  WBC 21.6* 20.7*  NEUTROABS 19.5*  --   HGB 15.4 14.3  HCT 42.5 41.7  MCV 87.4 89.1  PLT 141* 146*   Cardiac Enzymes: No results found for this basename: CKTOTAL, CKMB, CKMBINDEX, TROPONINI,  in the last 168 hours BNP (last 3 results)  Recent Labs  08/21/12 1439  PROBNP 137.4*   CBG: No results found for this basename: GLUCAP,  in the last 168 hours  Recent Results (from the past 240 hour(s))  RAPID STREP SCREEN     Status: Abnormal   Collection Time    08/22/12 12:55 AM      Result Value Range Status   Streptococcus, Group A Screen (Direct) POSITIVE (*) NEGATIVE Final     Studies: Dg Chest 2 View  08/21/2012   *RADIOLOGY REPORT*  Clinical Data: Chest pain and shortness of breath.  CHEST - 2 VIEW  Comparison: Two-view chest 10/14/2009.  Findings: The heart size is normal.  Mild pulmonary vascular congestion is evident.  Emphysematous changes are again noted. Minimal bibasilar atelectasis is evident.  No significant airspace consolidation is present.  IMPRESSION:  1.  Mild pulmonary vascular congestion. 2.  No other acute abnormality. 3.  Stable emphysema and chronic interstitial coarsening.   Original Report Authenticated By: [place], M.D.   Ct Head Wo Contrast  08/21/2012   *RADIOLOGY REPORT*  Clinical Data: 52 year old male with fall and continued dizziness.  CT HEAD WITHOUT CONTRAST  Technique:  Contiguous axial images were obtained from the base of the skull through the vertex without contrast.  Comparison: [facility] Head CT 09/23/2008.  Findings: Visualized orbits and scalp soft tissues are within normal limits.  Visualized paranasal sinuses and mastoids are clear.  No acute osseous abnormality identified.  Chronic dolichoectatic distal right vertebral and basilar artery. Mild mass effect on the right brainstem as before (series 2 image 8).  There may also be a degree of anterior circulation dolichoectasia. No  suspicious intracranial vascular hyperdensity.  Chronic-appearing left thalamic lacunar infarct is new since 2010. Also there are mild left frontal horn periventricular white matter changes which are new or increased. No evidence of cortically based acute infarction identified.  No acute intracranial hemorrhage identified.  No intracranial mass lesion.  No ventriculomegaly.  IMPRESSION: 1. No acute intracranial abnormality. 2.  Chronic vertebrobasilar dolichoectasia. 3.  Evidence of chronic small vessel ischemia which is new or progressed since 2010.   Original Report Authenticated By: Erskine [place], M.D.   Dg Knee Complete 4 Views Right  08/21/2012   *RADIOLOGY REPORT*  Clinical Data: Anterior knee pain after fall.  RIGHT KNEE - COMPLETE 4+ VIEW  Comparison: None.  Findings: No fracture or dislocation.  The minimal irregularity of the fibular head on one-view is probably related to projection rather than fracture.  Question the patient is tender here.  No findings of joint effusion.  IMPRESSION: No fracture or dislocation.  The minimal irregularity of the fibular head on one-view is probably related to projection rather than fracture.  Question the patient is tender here.  No findings of joint effusion   Original Report Authenticated By: Lacy Duverney, M.D.    Scheduled Meds: . amLODipine  5 mg Oral Daily  . carbamazepine  200 mg Oral QID  . cephALEXin  500 mg Oral Q12H  . enoxaparin (LOVENOX) injection  40 mg Subcutaneous Q24H  . pneumococcal 23 valent vaccine  0.5 mL Intramuscular Tomorrow-1000  . pregabalin  100 mg Oral TID BM  . pregabalin  200 mg Oral QHS   Continuous Infusions: . sodium chloride 125 mL/hr at 08/21/12 1705    Active Problems:   HTN (hypertension)   Chronic pain syndrome   Streptococcal sore throat   Weakness    Time spent: 35    [facility], Matthew [name]  [facility] Pager [phone]. If 7PM-7AM, please contact night-coverage at [email or web address], password [facility] 08/22/2012,  9:51 AM  LOS: 1 day

## 2012-08-23 DIAGNOSIS — E871 Hypo-osmolality and hyponatremia: Secondary | ICD-10-CM | POA: Diagnosis not present

## 2012-08-23 DIAGNOSIS — G500 Trigeminal neuralgia: Secondary | ICD-10-CM | POA: Diagnosis not present

## 2012-08-23 DIAGNOSIS — G894 Chronic pain syndrome: Secondary | ICD-10-CM | POA: Diagnosis not present

## 2012-08-23 DIAGNOSIS — J020 Streptococcal pharyngitis: Secondary | ICD-10-CM | POA: Diagnosis not present

## 2012-08-23 MED ORDER — ACETAMINOPHEN 325 MG PO TABS
650.0000 mg | ORAL_TABLET | Freq: Four times a day (QID) | ORAL | Status: DC | PRN
  Administered 2012-08-23: 650 mg via ORAL
  Filled 2012-08-23: qty 2

## 2012-08-23 MED ORDER — CEPHALEXIN 500 MG PO CAPS: 500.0000 mg | ORAL_CAPSULE | Freq: Two times a day (BID) | ORAL | Status: DC

## 2012-08-23 MED ORDER — CLORAZEPATE DIPOTASSIUM 7.5 MG PO TABS: 7.5000 mg | ORAL_TABLET | Freq: Three times a day (TID) | ORAL | Status: DC | PRN

## 2012-08-23 MED ORDER — AMLODIPINE BESYLATE 5 MG PO TABS: 5.0000 mg | ORAL_TABLET | Freq: Every day | ORAL | Status: DC

## 2012-08-23 NOTE — Progress Notes (Signed)
Patient discharged to home with instructions, verbalized understanding. 

## 2012-08-23 NOTE — Discharge Summary (Signed)
Physician Discharge Summary  KIP CROPP ZOX:[id number] DOB: 1960-10-05 DOA: 08/21/2012  PCP: Rudi Heap, MD  Admit date: 08/21/2012 Discharge date: 08/23/2012  Time spent: 35 minutes  Recommendations for Outpatient Follow-up:  1. Cbc, bmp 1 week 2. Stop HCTZ  Discharge Diagnoses:  Active Problems:   HTN (hypertension)   Chronic pain syndrome   Streptococcal sore throat   Weakness   Discharge Condition: improved  Diet recommendation: cardiac  Filed Weights   08/22/12 0900  Weight: 113.399 kg (250 lb)    History of present illness:  Matthew [name] is a 52 y.o. male with long h/o trigeminal neuralgia, recently diagnosed hypertension, was started on dyazide , since then, pt reports feeling dizzy, fatigued to the point of a fall today with abrasions over the right knee. He denies chest pain or sob, or palpitations. No orthopnea or PND. No focal weakness, he reported pedal edema which has resolved witht he new anti hypertensive. On arrival to ED, he was found to be hyponatremic and dehydrated. His WBC count was 21,000. He denies any diarrhea. He reports 2 episodes of vomiting and slight dizziness. He is being admitted to medical service for further evaluation.    Hospital Course:   Strep throat- keflex 500mg  BID- no sign of abscess  Hyponatremia- IVF, d/c HCTZ, resolved  HTN- d/c HCTZ, norvasc started by admitting dr- continue with titration as an outpatient  Leukocytosis- decreasing  Chronic pain/trigeminal neuralgia- continue home mediations      Consultations:  none  Discharge Exam: Filed Vitals:   08/22/12 0900 08/22/12 1347 08/22/12 2155 08/23/12 0550  BP:  134/68 131/76 145/93  Pulse:  57 62 62  Temp:  98.2 F (36.8 C) 98.4 F (36.9 C) 97.7 F (36.5 C)  TempSrc:  Oral Oral Oral  Resp:  19 17 17   Height: 5\' 9"  (1.753 m)     Weight: 113.399 kg (250 lb)     SpO2:  98% 99% 98%    General: A+Ox3, NAD; feeling better Cardiovascular: rrr Respiratory: clear  anterior  Discharge Instructions      Discharge Orders   Future Appointments Provider Department Dept Phone   09/05/2012 11:50 AM [facility] Pharmacist [facility] [phone]   09/19/2012 11:00 AM Deatra Canter, FNP [facility] [phone]   Future Orders Complete By Expires     Call MD for:  persistant nausea and vomiting  As directed     Call MD for:  severe uncontrolled pain  As directed     Call MD for:  temperature >100.4  As directed     Diet - low sodium heart healthy  As directed     Discharge instructions  As directed     Comments:      Cbc, bmp in 1 week    Increase activity slowly  As directed         Medication List    STOP taking these medications       Hydrocodone-Acetaminophen 5-300 MG Tabs     triamterene-hydrochlorothiazide 37.5-25 MG per capsule  Commonly known as:  DYAZIDE      TAKE these medications       albuterol 108 (90 BASE) MCG/ACT inhaler  Commonly known as:  PROVENTIL HFA;VENTOLIN HFA  Inhale 2 puffs into the lungs every 4 (four) hours as needed for wheezing or shortness of breath.     amLODipine 5 MG tablet  Commonly known as:  NORVASC  Take 1 tablet (5 mg total)  by mouth daily.     carbamazepine 200 MG tablet  Commonly known as:  TEGRETOL  Take 200 mg by mouth 4 (four) times daily.     cephALEXin 500 MG capsule  Commonly known as:  KEFLEX  Take 1 capsule (500 mg total) by mouth every 12 (twelve) hours.     clorazepate 7.5 MG tablet  Commonly known as:  TRANXENE  Take 1 tablet (7.5 mg total) by mouth every 8 (eight) hours as needed (anxiety).     pregabalin 100 MG capsule  Commonly known as:  LYRICA  Take 1 capsule (100 mg total) by mouth 3 (three) times daily.     pregabalin 200 MG capsule  Commonly known as:  LYRICA  Take 1 capsule (200 mg total) by mouth at bedtime.     traMADol 50 MG tablet  Commonly known as:  ULTRAM  Take 50 mg by mouth every 8 (eight) hours as needed for  pain.       Allergies  Allergen Reactions  . Penicillins    Follow-up Information   Follow up with Rudi Heap, MD In 1 week.   Contact information:   [address] [place] [address] [phone]        The results of significant diagnostics from this hospitalization (including imaging, microbiology, ancillary and laboratory) are listed below for reference.    Significant Diagnostic Studies: Dg Chest 2 View  08/21/2012   *RADIOLOGY REPORT*  Clinical Data: Chest pain and shortness of breath.  CHEST - 2 VIEW  Comparison: Two-view chest 10/14/2009.  Findings: The heart size is normal.  Mild pulmonary vascular congestion is evident.  Emphysematous changes are again noted. Minimal bibasilar atelectasis is evident.  No significant airspace consolidation is present.  IMPRESSION:  1.  Mild pulmonary vascular congestion. 2.  No other acute abnormality. 3.  Stable emphysema and chronic interstitial coarsening.   Original Report Authenticated By: [place], M.D.   Ct Head Wo Contrast  08/21/2012   *RADIOLOGY REPORT*  Clinical Data: 52 year old male with fall and continued dizziness.  CT HEAD WITHOUT CONTRAST  Technique:  Contiguous axial images were obtained from the base of the skull through the vertex without contrast.  Comparison: [facility] Head CT 09/23/2008.  Findings: Visualized orbits and scalp soft tissues are within normal limits.  Visualized paranasal sinuses and mastoids are clear.  No acute osseous abnormality identified.  Chronic dolichoectatic distal right vertebral and basilar artery. Mild mass effect on the right brainstem as before (series 2 image 8).  There may also be a degree of anterior circulation dolichoectasia. No suspicious intracranial vascular hyperdensity.  Chronic-appearing left thalamic lacunar infarct is new since 2010. Also there are mild left frontal horn periventricular white matter changes which are new or increased. No evidence of  cortically based acute infarction identified.  No acute intracranial hemorrhage identified.  No intracranial mass lesion.  No ventriculomegaly.  IMPRESSION: 1. No acute intracranial abnormality. 2.  Chronic vertebrobasilar dolichoectasia. 3.  Evidence of chronic small vessel ischemia which is new or progressed since 2010.   Original Report Authenticated By: Erskine Speed, M.D.   Dg Knee Complete 4 Views Right  08/21/2012   *RADIOLOGY REPORT*  Clinical Data: Anterior knee pain after fall.  RIGHT KNEE - COMPLETE 4+ VIEW  Comparison: None.  Findings: No fracture or dislocation.  The minimal irregularity of the fibular head on one-view is probably related to projection rather than fracture.  Question the patient is tender here.  No findings of joint effusion.  IMPRESSION: No fracture or dislocation.  The minimal irregularity of the fibular head on one-view is probably related to projection rather than fracture.  Question the patient is tender here.  No findings of joint effusion   Original Report Authenticated By: Lacy Duverney, M.D.    Microbiology: Recent Results (from the past 240 hour(s))  RAPID STREP SCREEN     Status: Abnormal   Collection Time    08/22/12 12:55 AM      Result Value Range Status   Streptococcus, Group A Screen (Direct) POSITIVE (*) NEGATIVE Final     Labs: Basic Metabolic Panel:  Recent Labs Lab 08/19/12 1500 08/21/12 1205 08/21/12 1746 08/22/12 0953  NA 132* 127*  --  135  K 4.5 3.7  --  4.2  CL 96 90*  --  94*  CO2 28 26  --  28  GLUCOSE 99 161*  --  149*  BUN 10 10  --  9  CREATININE 0.78 0.72 0.76 0.73  CALCIUM 9.7 8.7  --  8.9   Liver Function Tests:  Recent Labs Lab 08/21/12 1205  AST 13  ALT 20  ALKPHOS 111  BILITOT 0.5  PROT 7.5  ALBUMIN 3.5    Recent Labs Lab 08/21/12 1205  LIPASE 12   No results found for this basename: AMMONIA,  in the last 168 hours CBC:  Recent Labs Lab 08/21/12 1205 08/21/12 1746 08/22/12 0953  WBC 21.6* 20.7*  14.9*  NEUTROABS 19.5*  --  11.6*  HGB 15.4 14.3 14.7  HCT 42.5 41.7 42.6  MCV 87.4 89.1 88.6  PLT 141* 146* 151   Cardiac Enzymes: No results found for this basename: CKTOTAL, CKMB, CKMBINDEX, TROPONINI,  in the last 168 hours BNP: BNP (last 3 results)  Recent Labs  08/21/12 1439  PROBNP 137.4*   CBG: No results found for this basename: GLUCAP,  in the last 168 hours     Signed:  Benjamine Mola, Mavrick Mcquigg  [facility] 08/23/2012, 10:57 AM

## 2012-09-02 ENCOUNTER — Ambulatory Visit (INDEPENDENT_AMBULATORY_CARE_PROVIDER_SITE_OTHER): Payer: Medicare Other | Admitting: Family Medicine

## 2012-09-02 ENCOUNTER — Encounter: Payer: Self-pay | Admitting: Family Medicine

## 2012-09-02 VITALS — BP 164/97 | HR 61 | Temp 98.7°F | Wt 255.2 lb

## 2012-09-02 DIAGNOSIS — I10 Essential (primary) hypertension: Secondary | ICD-10-CM | POA: Diagnosis not present

## 2012-09-02 NOTE — Patient Instructions (Addendum)

## 2012-09-02 NOTE — Progress Notes (Signed)
  Subjective:    Patient ID: Matthew [name], male    DOB: 09/19/1960, 52 y.o.   MRN: [id number]  HPI This 52 y.o. male presents for evaluation of elevated blood pressure.  He is taking amlodipine 5mg  po qd and he has been getting readings on his bp machine of 190's systolic and he has Been concerned.   Review of Systems No chest pain, SOB, HA, dizziness, vision change, N/V, diarrhea, constipation, dysuria, urinary urgency or frequency, myalgias, arthralgias or rash.     Objective:   Physical Exam Vital signs noted  Well developed well nourished male.  HEENT - Head atraumatic Normocephalic                Eyes - PERRLA, Conjuctiva - clear Sclera- Clear EOMI                Ears - EAC's Wnl TM's Wnl Gross Hearing WNL                Nose - Nares patent                 Throat - oropharanx wnl Respiratory - Lungs CTA bilateral Cardiac - RRR S1 and S2 without murmur GI - Abdomen soft Nontender and bowel sounds active x 4 Extremities - No edema. Neuro - Grossly intact.       Assessment & Plan:  Essential hypertension, benign Norvasc 10mg  one po qd #30w/11rf, DASH diet, follow up next scheduled appointment in August.

## 2012-09-16 ENCOUNTER — Other Ambulatory Visit: Payer: Self-pay | Admitting: Family Medicine

## 2012-09-16 ENCOUNTER — Telehealth: Payer: Self-pay | Admitting: Family Medicine

## 2012-09-16 MED ORDER — CLORAZEPATE DIPOTASSIUM 7.5 MG PO TABS: 7.5000 mg | ORAL_TABLET | Freq: Three times a day (TID) | ORAL | Status: DC | PRN

## 2012-09-16 NOTE — Telephone Encounter (Signed)
rx up front ready to pick up

## 2012-09-16 NOTE — Telephone Encounter (Signed)
Wants a refill sent in to [facility]

## 2012-09-16 NOTE — Telephone Encounter (Signed)
Come and p/u rx 

## 2012-09-19 ENCOUNTER — Ambulatory Visit: Payer: Medicare Other | Admitting: General Practice

## 2012-09-19 ENCOUNTER — Ambulatory Visit (INDEPENDENT_AMBULATORY_CARE_PROVIDER_SITE_OTHER): Payer: Medicare Other | Admitting: Family Medicine

## 2012-09-19 ENCOUNTER — Encounter: Payer: Self-pay | Admitting: Family Medicine

## 2012-09-19 VITALS — BP 146/94 | HR 55 | Temp 97.4°F | Ht 69.0 in | Wt 258.0 lb

## 2012-09-19 DIAGNOSIS — R569 Unspecified convulsions: Secondary | ICD-10-CM | POA: Diagnosis not present

## 2012-09-19 DIAGNOSIS — I10 Essential (primary) hypertension: Secondary | ICD-10-CM | POA: Diagnosis not present

## 2012-09-19 MED ORDER — CARBAMAZEPINE 200 MG PO TABS: 400.0000 mg | ORAL_TABLET | Freq: Four times a day (QID) | ORAL | Status: DC

## 2012-09-19 MED ORDER — AMLODIPINE BESYLATE 10 MG PO TABS: 10.0000 mg | ORAL_TABLET | Freq: Every day | ORAL | Status: DC

## 2012-09-19 NOTE — Progress Notes (Signed)
  Subjective:    Patient ID: Matthew [name], male    DOB: 04/19/1960, 52 y.o.   MRN: [id number]  HPI This 52 y.o. male presents for evaluation of follow up on hypertension.  He has Been having elevated blood pressure and has had his amlodipine increased to 10mg  po qd.  He has been under some stress since his son left to [place].   Review of Systems No chest pain, SOB, HA, dizziness, vision change, N/V, diarrhea, constipation, dysuria, urinary urgency or frequency, myalgias, arthralgias or rash.     Objective:   Physical Exam  Vital signs noted  Well developed well nourished male.  HEENT - Head atraumatic Normocephalic                Throat - oropharanx wnl Respiratory - Lungs CTA bilateral Cardiac - RRR S1 and S2 without murmur. Extremities - No edema. Neuro - Grossly intact.     Repeat bp is 140/90. Assessment & Plan:  Essential hypertension, benign Doing better recommend follow up in 3 months.

## 2012-09-19 NOTE — Patient Instructions (Addendum)

## 2012-10-01 ENCOUNTER — Other Ambulatory Visit: Payer: Self-pay | Admitting: *Deleted

## 2012-10-01 ENCOUNTER — Other Ambulatory Visit: Payer: Self-pay | Admitting: Family Medicine

## 2012-10-01 DIAGNOSIS — G894 Chronic pain syndrome: Secondary | ICD-10-CM

## 2012-10-01 NOTE — Telephone Encounter (Signed)
This rx is a controlled medicine and this has been discontinued.  If he is seeing pain  Management we can not rx It for him. If he wants to resume this then he needs to follow up for a visit please.

## 2012-10-01 NOTE — Telephone Encounter (Signed)
PT HAS LYRICA 200MG  LISTED IN EPIC AS WELL. PLEASE REVIEW BEFORE REFILLING. PRINT IF APPROVED AND ROUTE TO NURSING POOL A

## 2012-10-03 NOTE — Telephone Encounter (Signed)
refuse

## 2012-10-03 NOTE — Telephone Encounter (Signed)
Call in rx with 1 rf

## 2012-10-07 MED ORDER — PREGABALIN 100 MG PO CAPS: 100.0000 mg | ORAL_CAPSULE | Freq: Three times a day (TID) | ORAL | Status: DC

## 2012-10-07 NOTE — Telephone Encounter (Signed)
rx

## 2012-10-21 ENCOUNTER — Ambulatory Visit (INDEPENDENT_AMBULATORY_CARE_PROVIDER_SITE_OTHER): Payer: Medicare Other | Admitting: Family Medicine

## 2012-10-21 VITALS — BP 138/85 | HR 73 | Temp 100.6°F | Ht 69.0 in | Wt 262.0 lb

## 2012-10-21 DIAGNOSIS — J029 Acute pharyngitis, unspecified: Secondary | ICD-10-CM | POA: Diagnosis not present

## 2012-10-21 LAB — POCT RAPID STREP A (OFFICE): Rapid Strep A Screen: POSITIVE — AB

## 2012-10-21 MED ORDER — CLORAZEPATE DIPOTASSIUM 7.5 MG PO TABS: 7.5000 mg | ORAL_TABLET | Freq: Four times a day (QID) | ORAL | Status: DC | PRN

## 2012-10-21 MED ORDER — AZITHROMYCIN 250 MG PO TABS: ORAL_TABLET | ORAL | Status: DC

## 2012-10-21 NOTE — Patient Instructions (Signed)
Strep Throat  Strep throat is an infection of the throat caused by a bacteria named Streptococcus pyogenes. Your caregiver may call the infection streptococcal "tonsillitis" or "pharyngitis" depending on whether there are signs of inflammation in the tonsils or back of the throat. Strep throat is most common in children aged 53 15 years during the cold months of the year, but it can occur in people of any age during any season. This infection is spread from person to person (contagious) through coughing, sneezing, or other close contact.  SYMPTOMS   · Fever or chills.  · Painful, swollen, red tonsils or throat.  · Pain or difficulty when swallowing.  · White or yellow spots on the tonsils or throat.  · Swollen, tender lymph nodes or "glands" of the neck or under the jaw.  · Red rash all over the body (rare).  DIAGNOSIS   Many different infections can cause the same symptoms. A test must be done to confirm the diagnosis so the right treatment can be given. A "rapid strep test" can help your caregiver make the diagnosis in a few minutes. If this test is not available, a light swab of the infected area can be used for a throat culture test. If a throat culture test is done, results are usually available in a day or two.  TREATMENT   Strep throat is treated with antibiotic medicine.  HOME CARE INSTRUCTIONS   · Gargle with 1 tsp of salt in 1 cup of warm water, 3 4 times per day or as needed for comfort.  · Family members who also have a sore throat or fever should be tested for strep throat and treated with antibiotics if they have the strep infection.  · Make sure everyone in your household washes their hands well.  · Do not share food, drinking cups, or personal items that could cause the infection to spread to others.  · You may need to eat a soft food diet until your sore throat gets better.  · Drink enough water and fluids to keep your urine clear or pale yellow. This will help prevent dehydration.  · Get plenty of  rest.  · Stay home from school, daycare, or work until you have been on antibiotics for 24 hours.  · Only take over-the-counter or prescription medicines for pain, discomfort, or fever as directed by your caregiver.  · If antibiotics are prescribed, take them as directed. Finish them even if you start to feel better.  SEEK MEDICAL CARE IF:   · The glands in your neck continue to enlarge.  · You develop a rash, cough, or earache.  · You cough up green, yellow-brown, or bloody sputum.  · You have pain or discomfort not controlled by medicines.  · Your problems seem to be getting worse rather than better.  SEEK IMMEDIATE MEDICAL CARE IF:   · You develop any new symptoms such as vomiting, severe headache, stiff or painful neck, chest pain, shortness of breath, or trouble swallowing.  · You develop severe throat pain, drooling, or changes in your voice.  · You develop swelling of the neck, or the skin on the neck becomes red and tender.  · You have a fever.  · You develop signs of dehydration, such as fatigue, dry mouth, and decreased urination.  · You become increasingly sleepy, or you cannot wake up completely.  Document Released: 01/28/2000 Document Revised: 01/17/2012 Document Reviewed: 03/31/2010  [employer]® Patient Information ©2014 [employer].

## 2012-10-21 NOTE — Progress Notes (Signed)
  Subjective:    Patient ID: KEYLEN ECKENRODE, male    DOB: 1960/11/01, 52 y.o.   MRN: [id number]  HPI  This 52 y.o. male presents for evaluation of pharyngitis for 3 days. He has been having fever and feeling bad over the weekend.  Review of Systems C/o sore throat   No chest pain, SOB, HA, dizziness, vision change, N/V, diarrhea, constipation, dysuria, urinary urgency or frequency, myalgias, arthralgias or rash.  Objective:   Physical Exam  Vital signs noted  Well developed well nourished male.  HEENT - Head atraumatic Normocephalic                Eyes - PERRLA, Conjuctiva - clear Sclera- Clear EOMI                Ears - EAC's Wnl TM's Wnl Gross Hearing WNL                Nose - Nares patent                 Throat - oropharanx injected and with exudates on tonsils. Respiratory - Lungs CTA bilateral Cardiac - RRR S1 and S2 without murmur   Results for orders placed in visit on 10/21/12  POCT RAPID STREP A (OFFICE)      Result Value Range   Rapid Strep A Screen Positive (*) Negative      Assessment & Plan:  Acute pharyngitis - Plan: POCT rapid strep A, azithromycin (ZITHROMAX) 250 MG tablet 2po first day and then one po qd x 4 days.  Tylenol and motrin otc as directed.

## 2012-11-13 ENCOUNTER — Ambulatory Visit (INDEPENDENT_AMBULATORY_CARE_PROVIDER_SITE_OTHER): Payer: Medicare Other | Admitting: General Practice

## 2012-11-13 ENCOUNTER — Encounter: Payer: Self-pay | Admitting: General Practice

## 2012-11-13 VITALS — BP 157/96 | HR 68 | Temp 96.9°F | Ht 69.0 in | Wt 267.0 lb

## 2012-11-13 DIAGNOSIS — J209 Acute bronchitis, unspecified: Secondary | ICD-10-CM | POA: Diagnosis not present

## 2012-11-13 DIAGNOSIS — J069 Acute upper respiratory infection, unspecified: Secondary | ICD-10-CM

## 2012-11-13 MED ORDER — METHYLPREDNISOLONE ACETATE 80 MG/ML IJ SUSP
80.0000 mg | Freq: Once | INTRAMUSCULAR | Status: AC
  Administered 2012-11-13: 80 mg via INTRAMUSCULAR

## 2012-11-13 MED ORDER — ALBUTEROL SULFATE HFA 108 (90 BASE) MCG/ACT IN AERS: 2.0000 | INHALATION_SPRAY | Freq: Four times a day (QID) | RESPIRATORY_TRACT | Status: DC | PRN

## 2012-11-13 MED ORDER — AZITHROMYCIN 250 MG PO TABS: ORAL_TABLET | ORAL | Status: DC

## 2012-11-13 NOTE — Progress Notes (Signed)
  Subjective:    Patient ID: Matthew [name], male    DOB: 1960-07-25, 52 y.o.   MRN: [id number]  Cough This is a new problem. The current episode started in the past 7 days. The problem has been gradually worsening. The problem occurs every few hours. The cough is productive of sputum. Associated symptoms include nasal congestion, postnasal drip and shortness of breath. Pertinent negatives include no chest pain, chills, ear congestion, fever, headaches or wheezing. The symptoms are aggravated by lying down. He has tried OTC cough suppressant for the symptoms. His past medical history is significant for bronchitis.  Reports smoking 1ppd of cigarettes.     Review of Systems  Constitutional: Negative for fever and chills.  HENT: Positive for congestion, postnasal drip and sinus pressure. Negative for neck pain and neck stiffness.   Respiratory: Positive for cough and shortness of breath. Negative for chest tightness and wheezing.        History of bronchitis and smoker  Cardiovascular: Negative for chest pain.  Neurological: Negative for dizziness, weakness and headaches.       Objective:   Physical Exam  Constitutional: He is oriented to person, place, and time. He appears well-developed and well-nourished.  HENT:  Head: Normocephalic and atraumatic.  Right Ear: External ear normal.  Left Ear: External ear normal.  Nose: Right sinus exhibits maxillary sinus tenderness. Left sinus exhibits maxillary sinus tenderness.  Mouth/Throat: Posterior oropharyngeal erythema present.  Cardiovascular: Normal rate, regular rhythm and normal heart sounds.   Pulmonary/Chest: Effort normal. No respiratory distress. He has wheezes in the right upper field and the left upper field. He exhibits no tenderness.  Neurological: He is alert and oriented to person, place, and time.  Skin: Skin is warm and dry.  Psychiatric: He has a normal mood and affect.          Assessment & Plan:  1. Acute bronchitis -  methylPREDNISolone acetate (DEPO-MEDROL) injection 80 mg; Inject 1 mL (80 mg total) into the muscle once. - albuterol (PROVENTIL HFA;VENTOLIN HFA) 108 (90 BASE) MCG/ACT inhaler; Inhale 2 puffs into the lungs every 6 (six) hours as needed for wheezing.  Dispense: 1 Inhaler; Refill: 0 -discussed smoking cessation (denies being ready to stop smoking)  2. Acute upper respiratory infections of unspecified site - azithromycin (ZITHROMAX) 250 MG tablet; Take as directed  Dispense: 6 tablet; Refill: 0 -discussed increasing physical activity as tolerated -discussed acute bronchitis and upper respiratory infection and medications (patient verbalized unable to ready and write) -patient verbalized understanding of medications, how to administer and reason being prescribed -RTO if symptoms worsen or unresolved -Patient verbalized understanding -Coralie Keens, FNP-C

## 2012-11-13 NOTE — Patient Instructions (Addendum)
Upper Respiratory Infection, Adult An upper respiratory infection (URI) is also sometimes known as the common cold. The upper respiratory tract includes the nose, sinuses, throat, trachea, and bronchi. Bronchi are the airways leading to the lungs. Most people improve within 1 week, but symptoms can last up to 2 weeks. A residual cough may last even longer.  CAUSES Many different viruses can infect the tissues lining the upper respiratory tract. The tissues become irritated and inflamed and often become very moist. Mucus production is also common. A cold is contagious. You can easily spread the virus to others by oral contact. This includes kissing, sharing a glass, coughing, or sneezing. Touching your mouth or nose and then touching a surface, which is then touched by another person, can also spread the virus. SYMPTOMS  Symptoms typically develop 1 to 3 days after you come in contact with a cold virus. Symptoms vary from person to person. They may include:  Runny nose.  Sneezing.  Nasal congestion.  Sinus irritation.  Sore throat.  Loss of voice (laryngitis).  Cough.  Fatigue.  Muscle aches.  Loss of appetite.  Headache.  Low-grade fever. DIAGNOSIS  You might diagnose your own cold based on familiar symptoms, since most people get a cold 2 to 3 times a year. Your caregiver can confirm this based on your exam. Most importantly, your caregiver can check that your symptoms are not due to another disease such as strep throat, sinusitis, pneumonia, asthma, or epiglottitis. Blood tests, throat tests, and X-rays are not necessary to diagnose a common cold, but they may sometimes be helpful in excluding other more serious diseases. Your caregiver will decide if any further tests are required. RISKS AND COMPLICATIONS  You may be at risk for a more severe case of the common cold if you smoke cigarettes, have chronic heart disease (such as heart failure) or lung disease (such as asthma), or if  you have a weakened immune system. The very young and very old are also at risk for more serious infections. Bacterial sinusitis, middle ear infections, and bacterial pneumonia can complicate the common cold. The common cold can worsen asthma and chronic obstructive pulmonary disease (COPD). Sometimes, these complications can require emergency medical care and may be life-threatening. PREVENTION  The best way to protect against getting a cold is to practice good hygiene. Avoid oral or hand contact with people with cold symptoms. Wash your hands often if contact occurs. There is no clear evidence that vitamin C, vitamin E, echinacea, or exercise reduces the chance of developing a cold. However, it is always recommended to get plenty of rest and practice good nutrition. TREATMENT  Treatment is directed at relieving symptoms. There is no cure. Antibiotics are not effective, because the infection is caused by a virus, not by bacteria. Treatment may include:  Increased fluid intake. Sports drinks offer valuable electrolytes, sugars, and fluids.  Breathing heated mist or steam (vaporizer or shower).  Eating chicken soup or other clear broths, and maintaining good nutrition.  Getting plenty of rest.  Using gargles or lozenges for comfort.  Controlling fevers with ibuprofen or acetaminophen as directed by your caregiver.  Increasing usage of your inhaler if you have asthma. Zinc gel and zinc lozenges, taken in the first 24 hours of the common cold, can shorten the duration and lessen the severity of symptoms. Pain medicines may help with fever, muscle aches, and throat pain. A variety of non-prescription medicines are available to treat congestion and runny nose. Your caregiver   can make recommendations and may suggest nasal or lung inhalers for other symptoms.  HOME CARE INSTRUCTIONS   Only take over-the-counter or prescription medicines for pain, discomfort, or fever as directed by your  caregiver.  Use a warm mist humidifier or inhale steam from a shower to increase air moisture. This may keep secretions moist and make it easier to breathe.  Drink enough water and fluids to keep your urine clear or pale yellow.  Rest as needed.  Return to work when your temperature has returned to normal or as your caregiver advises. You may need to stay home longer to avoid infecting others. You can also use a face mask and careful hand washing to prevent spread of the virus. SEEK MEDICAL CARE IF:   After the first few days, you feel you are getting worse rather than better.  You need your caregiver's advice about medicines to control symptoms.  You develop chills, worsening shortness of breath, or brown or red sputum. These may be signs of pneumonia.  You develop yellow or brown nasal discharge or pain in the face, especially when you bend forward. These may be signs of sinusitis.  You develop a fever, swollen neck glands, pain with swallowing, or white areas in the back of your throat. These may be signs of strep throat. SEEK IMMEDIATE MEDICAL CARE IF:   You have a fever.  You develop severe or persistent headache, ear pain, sinus pain, or chest pain.  You develop wheezing, a prolonged cough, cough up blood, or have a change in your usual mucus (if you have chronic lung disease).  You develop sore muscles or a stiff neck. Document Released: 07/26/2000 Document Revised: 04/24/2011 Document Reviewed: 06/03/2010 [facility] Patient Information 2014 [place], [place].  Bronchitis Bronchitis is the body's way of reacting to injury and/or infection (inflammation) of the bronchi. Bronchi are the air tubes that extend from the windpipe into the lungs. If the inflammation becomes severe, it may cause shortness of breath. CAUSES  Inflammation may be caused by:  A virus.  Germs (bacteria).  Dust.  Allergens.  Pollutants and many other irritants. The cells lining the bronchial tree  are covered with tiny hairs (cilia). These constantly beat upward, away from the lungs, toward the mouth. This keeps the lungs free of pollutants. When these cells become too irritated and are unable to do their job, mucus begins to develop. This causes the characteristic cough of bronchitis. The cough clears the lungs when the cilia are unable to do their job. Without either of these protective mechanisms, the mucus would settle in the lungs. Then you would develop pneumonia. Smoking is a common cause of bronchitis and can contribute to pneumonia. Stopping this habit is the single most important thing you can do to help yourself. TREATMENT   Your caregiver may prescribe an antibiotic if the cough is caused by bacteria. Also, medicines that open up your airways make it easier to breathe. Your caregiver may also recommend or prescribe an expectorant. It will loosen the mucus to be coughed up. Only take over-the-counter or prescription medicines for pain, discomfort, or fever as directed by your caregiver.  Removing whatever causes the problem (smoking, for example) is critical to preventing the problem from getting worse.  Cough suppressants may be prescribed for relief of cough symptoms.  Inhaled medicines may be prescribed to help with symptoms now and to help prevent problems from returning.  For those with recurrent (chronic) bronchitis, there may be a need for steroid  by your caregiver.   Removing whatever causes the problem (smoking, for example) is critical to preventing the problem from getting worse.   Cough suppressants may be prescribed for relief of cough symptoms.   Inhaled medicines may be prescribed to help with symptoms now and to help prevent problems from returning.   For those with recurrent (chronic) bronchitis, there may be a need for steroid medicines.  SEEK IMMEDIATE MEDICAL CARE IF:    During treatment, you develop more pus-like mucus (purulent sputum).   You have a fever.   Your baby is older than 3 months with a rectal temperature of 102 F (38.9 C) or higher.   Your baby is 3 months old or younger with a rectal temperature of 100.4 F (38 C) or higher.   You become progressively more ill.   You have increased difficulty breathing, wheezing, or shortness of breath.  It is necessary to seek immediate medical care if you are elderly or sick from any other  disease.  MAKE SURE YOU:    Understand these instructions.   Will watch your condition.   Will get help right away if you are not doing well or get worse.  Document Released: 01/30/2005 Document Revised: 04/24/2011 Document Reviewed: 12/10/2007  [employer] Patient Information 2014 [employer].

## 2012-11-17 ENCOUNTER — Other Ambulatory Visit: Payer: Self-pay | Admitting: Family Medicine

## 2012-11-19 NOTE — Telephone Encounter (Signed)
Last seen 09/19/12  B [place]  If approved route to nurse to phone into [facility]

## 2012-11-21 ENCOUNTER — Other Ambulatory Visit: Payer: Self-pay

## 2012-11-21 ENCOUNTER — Other Ambulatory Visit: Payer: Self-pay | Admitting: Family Medicine

## 2012-11-21 DIAGNOSIS — J029 Acute pharyngitis, unspecified: Secondary | ICD-10-CM

## 2012-11-21 NOTE — Telephone Encounter (Signed)
Last seen 09/19/12  B [place] 

## 2012-11-22 NOTE — Telephone Encounter (Signed)
cALLED IN 

## 2012-11-26 MED ORDER — AZITHROMYCIN 250 MG PO TABS: ORAL_TABLET | ORAL | Status: DC

## 2012-11-26 NOTE — Telephone Encounter (Signed)
This is still pending from the 9th

## 2012-12-16 ENCOUNTER — Encounter (INDEPENDENT_AMBULATORY_CARE_PROVIDER_SITE_OTHER): Payer: Self-pay

## 2012-12-16 ENCOUNTER — Ambulatory Visit: Payer: Medicare Other | Admitting: *Deleted

## 2012-12-16 VITALS — BP 128/74 | HR 64

## 2012-12-16 DIAGNOSIS — I10 Essential (primary) hypertension: Secondary | ICD-10-CM

## 2012-12-16 NOTE — Progress Notes (Signed)
Patient ID: Matthew [name], male   DOB: [birth year], 52 y.o.   MRN: [id number] Pt in for bp recheck Elevated at last visit Today's reading was 128/74 Has appt in 12/24/2012

## 2012-12-20 ENCOUNTER — Ambulatory Visit: Payer: Medicare Other | Admitting: Family Medicine

## 2012-12-24 ENCOUNTER — Encounter (INDEPENDENT_AMBULATORY_CARE_PROVIDER_SITE_OTHER): Payer: Self-pay

## 2012-12-24 ENCOUNTER — Encounter: Payer: Medicare Other | Admitting: *Deleted

## 2012-12-25 ENCOUNTER — Ambulatory Visit (INDEPENDENT_AMBULATORY_CARE_PROVIDER_SITE_OTHER): Payer: Medicare Other | Admitting: Family Medicine

## 2012-12-25 ENCOUNTER — Encounter: Payer: Self-pay | Admitting: Family Medicine

## 2012-12-25 VITALS — BP 141/91 | HR 60 | Temp 97.7°F | Ht 69.0 in | Wt 273.0 lb

## 2012-12-25 DIAGNOSIS — J449 Chronic obstructive pulmonary disease, unspecified: Secondary | ICD-10-CM | POA: Diagnosis not present

## 2012-12-25 DIAGNOSIS — R569 Unspecified convulsions: Secondary | ICD-10-CM

## 2012-12-25 DIAGNOSIS — G500 Trigeminal neuralgia: Secondary | ICD-10-CM | POA: Diagnosis not present

## 2012-12-25 DIAGNOSIS — G8929 Other chronic pain: Secondary | ICD-10-CM | POA: Diagnosis not present

## 2012-12-25 DIAGNOSIS — I10 Essential (primary) hypertension: Secondary | ICD-10-CM

## 2012-12-25 DIAGNOSIS — R7309 Other abnormal glucose: Secondary | ICD-10-CM | POA: Diagnosis not present

## 2012-12-25 DIAGNOSIS — R739 Hyperglycemia, unspecified: Secondary | ICD-10-CM

## 2012-12-25 DIAGNOSIS — E785 Hyperlipidemia, unspecified: Secondary | ICD-10-CM | POA: Diagnosis not present

## 2012-12-25 DIAGNOSIS — G894 Chronic pain syndrome: Secondary | ICD-10-CM

## 2012-12-25 LAB — POCT GLYCOSYLATED HEMOGLOBIN (HGB A1C): Hemoglobin A1C: 5.4

## 2012-12-25 MED ORDER — PREGABALIN 100 MG PO CAPS: 100.0000 mg | ORAL_CAPSULE | Freq: Three times a day (TID) | ORAL | Status: DC

## 2012-12-25 MED ORDER — AMLODIPINE BESYLATE 10 MG PO TABS: 10.0000 mg | ORAL_TABLET | Freq: Every day | ORAL | Status: DC

## 2012-12-25 MED ORDER — CARBAMAZEPINE 200 MG PO TABS: 400.0000 mg | ORAL_TABLET | Freq: Four times a day (QID) | ORAL | Status: DC

## 2012-12-25 MED ORDER — BUDESONIDE-FORMOTEROL FUMARATE 160-4.5 MCG/ACT IN AERO: 2.0000 | INHALATION_SPRAY | Freq: Two times a day (BID) | RESPIRATORY_TRACT | Status: DC

## 2012-12-25 MED ORDER — ALBUTEROL SULFATE HFA 108 (90 BASE) MCG/ACT IN AERS: 2.0000 | INHALATION_SPRAY | Freq: Four times a day (QID) | RESPIRATORY_TRACT | Status: DC | PRN

## 2012-12-25 MED ORDER — PREGABALIN 200 MG PO CAPS: 200.0000 mg | ORAL_CAPSULE | Freq: Every day | ORAL | Status: DC

## 2012-12-25 NOTE — Addendum Note (Signed)
Addended by: Orma Render F on: 12/25/2012 05:06 PM   Modules accepted: Orders

## 2012-12-25 NOTE — Patient Instructions (Signed)

## 2012-12-25 NOTE — Progress Notes (Signed)
  Subjective:    Patient ID: AMARII BORDAS, male    DOB: 1960-05-28, 52 y.o.   MRN: [id number]  HPI This 52 y.o. male presents for evaluation of hypertension, hyperlipidemia, trigeminal neuralgia, C/o ED, chronic pain, and SOB and cough.  He has been a long time cigarette smoker since He was 52 years old and smokes a ppd and he has been noticing the short acting bronchodilator Doesn't help as much as used to and he has to use it more often .   Review of Systems C/o sob and ED. No chest pain, HA, dizziness, vision change, N/V, diarrhea, constipation, dysuria, urinary urgency or frequency, myalgias, arthralgias or rash.     Objective:   Physical Exam Vital signs noted  Well developed well nourished male.  HEENT - Head atraumatic Normocephalic                Eyes - PERRLA, Conjuctiva - clear Sclera- Clear EOMI                Ears - EAC's Wnl TM's Wnl Gross Hearing WNL                Nose - Nares patent                 Throat - oropharanx wnl Respiratory - Lungs CTA bilateral Cardiac - RRR S1 and S2 without murmur GI - Abdomen soft Nontender and bowel sounds active x 4 Extremities - No edema. Neuro - Grossly intact.       Assessment & Plan:  Chronic pain syndrome - Plan: pregabalin (LYRICA) 200 MG capsule, POCT CBC  Essential hypertension, benign - Plan: amLODipine (NORVASC) 10 MG tablet  Convulsions/seizures - Plan: carbamazepine (TEGRETOL) 200 MG tablet  Trigeminal neuralgia - Plan: pregabalin (LYRICA) 200 MG capsule  COPD (chronic obstructive pulmonary disease) - Plan: budesonide-formoterol (SYMBICORT) 160-4.5 MCG/ACT inhaler, albuterol (PROVENTIL HFA;VENTOLIN HFA) 108 (90 BASE) MCG/ACT inhaler  Chronic pain - Plan: carbamazepine (TEGRETOL) 200 MG tablet, POCT CBC  Hyperglycemia - Plan: POCT CBC, POCT glycosylated hemoglobin (Hb A1C)  Hyperlipidemia - Plan: CMP14+EGFR, Lipid panel, Thyroid Panel With TSH  Deatra Canter FNP

## 2012-12-26 LAB — CBC WITH DIFFERENTIAL
Basophils Absolute: 0.1 10*3/uL (ref 0.0–0.2)
Basos: 1 %
Eos: 2 %
Eosinophils Absolute: 0.2 10*3/uL (ref 0.0–0.4)
HCT: 43.2 % (ref 37.5–51.0)
Hemoglobin: 14.9 g/dL (ref 12.6–17.7)
Immature Grans (Abs): 0 10*3/uL (ref 0.0–0.1)
Immature Granulocytes: 0 %
Lymphocytes Absolute: 2.6 10*3/uL (ref 0.7–3.1)
Lymphs: 31 %
MCH: 30.7 pg (ref 26.6–33.0)
MCHC: 34.5 g/dL (ref 31.5–35.7)
MCV: 89 fL (ref 79–97)
Monocytes Absolute: 0.5 10*3/uL (ref 0.1–0.9)
Monocytes: 6 %
Neutrophils Absolute: 5 10*3/uL (ref 1.4–7.0)
Neutrophils Relative %: 60 %
Platelets: 252 10*3/uL (ref 150–379)
RBC: 4.86 x10E6/uL (ref 4.14–5.80)
RDW: 13.9 % (ref 12.3–15.4)
WBC: 8.4 10*3/uL (ref 3.4–10.8)

## 2012-12-26 LAB — CMP14+EGFR
ALT: 22 [IU]/L (ref 0–44)
AST: 16 [IU]/L (ref 0–40)
Albumin/Globulin Ratio: 1.4 (ref 1.1–2.5)
Albumin: 4.4 g/dL (ref 3.5–5.5)
Alkaline Phosphatase: 112 [IU]/L (ref 39–117)
BUN/Creatinine Ratio: 12 (ref 9–20)
BUN: 8 mg/dL (ref 6–24)
CO2: 26 mmol/L (ref 18–29)
Calcium: 9 mg/dL (ref 8.7–10.2)
Chloride: 99 mmol/L (ref 97–108)
Creatinine, Ser: 0.65 mg/dL — ABNORMAL LOW (ref 0.76–1.27)
GFR calc Af Amer: 128 mL/min/{1.73_m2}
GFR calc non Af Amer: 111 mL/min/{1.73_m2}
Globulin, Total: 3.1 g/dL (ref 1.5–4.5)
Glucose: 91 mg/dL (ref 65–99)
Potassium: 4.5 mmol/L (ref 3.5–5.2)
Sodium: 139 mmol/L (ref 134–144)
Total Bilirubin: <0.2 mg/dL (ref 0.0–1.2)
Total Protein: 7.5 g/dL (ref 6.0–8.5)

## 2012-12-26 LAB — THYROID PANEL WITH TSH
Free Thyroxine Index: 1.3 (ref 1.2–4.9)
T3 Uptake Ratio: 27 % (ref 24–39)
T4, Total: 4.7 ug/dL (ref 4.5–12.0)
TSH: 1.68 u[IU]/mL (ref 0.450–4.500)

## 2012-12-26 LAB — LIPID PANEL
Chol/HDL Ratio: 4.8 ratio units (ref 0.0–5.0)
Cholesterol, Total: 271 mg/dL — ABNORMAL HIGH (ref 100–199)
HDL: 57 mg/dL (ref >39)
LDL Calculated: 162 mg/dL — ABNORMAL HIGH (ref 0–99)
Triglycerides: 261 mg/dL — ABNORMAL HIGH (ref 0–149)
VLDL Cholesterol Cal: 52 mg/dL — ABNORMAL HIGH (ref 5–40)

## 2013-01-14 ENCOUNTER — Telehealth: Payer: Self-pay | Admitting: Family Medicine

## 2013-01-14 NOTE — Telephone Encounter (Signed)
Message copied by Azalee Course on Tue Jan 14, 2013 10:51 AM ------      Message from: Deatra Canter      Created: Wed Jan 01, 2013  9:27 AM       Recommend taking statin rx for hyperlipidemia ------

## 2013-01-14 NOTE — Telephone Encounter (Signed)
Phone has been shut off

## 2013-02-14 ENCOUNTER — Other Ambulatory Visit: Payer: Self-pay | Admitting: Family Medicine

## 2013-02-17 ENCOUNTER — Telehealth: Payer: Self-pay | Admitting: Family Medicine

## 2013-02-17 ENCOUNTER — Ambulatory Visit (INDEPENDENT_AMBULATORY_CARE_PROVIDER_SITE_OTHER): Payer: Medicare Other | Admitting: Family Medicine

## 2013-02-17 VITALS — BP 159/86 | HR 64 | Temp 96.3°F | Ht 69.0 in | Wt 270.0 lb

## 2013-02-17 DIAGNOSIS — J209 Acute bronchitis, unspecified: Secondary | ICD-10-CM

## 2013-02-17 DIAGNOSIS — J449 Chronic obstructive pulmonary disease, unspecified: Secondary | ICD-10-CM

## 2013-02-17 MED ORDER — BECLOMETHASONE DIPROPIONATE 80 MCG/ACT IN AERS: 2.0000 | INHALATION_SPRAY | Freq: Two times a day (BID) | RESPIRATORY_TRACT | Status: DC

## 2013-02-17 MED ORDER — AZITHROMYCIN 250 MG PO TABS: ORAL_TABLET | ORAL | Status: DC

## 2013-02-17 MED ORDER — PREDNISONE 10 MG PO TABS: ORAL_TABLET | ORAL | Status: DC

## 2013-02-17 MED ORDER — ALBUTEROL SULFATE HFA 108 (90 BASE) MCG/ACT IN AERS: 2.0000 | INHALATION_SPRAY | Freq: Four times a day (QID) | RESPIRATORY_TRACT | Status: DC | PRN

## 2013-02-17 NOTE — Progress Notes (Signed)
   Subjective:    Patient ID: Matthew [name], male    DOB: 1961-02-13, 53 y.o.   MRN: [id number]  HPI This 53 y.o. male presents for evaluation of cough, wheezing, and exacerbation of COPD. He is out of his inhalers and needs refills.   Review of Systems C/o cough and URI sx's. No chest pain, SOB, HA, dizziness, vision change, N/V, diarrhea, constipation, dysuria, urinary urgency or frequency, myalgias, arthralgias or rash.     Objective:   Physical Exam  Vital signs noted  Well developed well nourished male.  HEENT - Head atraumatic Normocephalic                Eyes - PERRLA, Conjuctiva - clear Sclera- Clear EOMI                Ears - EAC's Wnl TM's Wnl Gross Hearing WNL                Throat - oropharanx wnl Respiratory - Lungs with scattered wheezes bilateral Cardiac - RRR S1 and S2 without murmur       Assessment & Plan:  COPD (chronic obstructive pulmonary disease) - Plan: albuterol (PROVENTIL HFA;VENTOLIN HFA) 108 (90 BASE) MCG/ACT inhaler.  Acute Bronchitis - Prednisone taper, ZPak, Albutrerol MDI, and QVAR 2 puffs bid, DC symbicort. Push po fluids, rest, tylenol and motrin otc prn as directed for fever, arthralgias, and myalgias.  Follow up prn if sx's continue or persist.  Lysbeth Penner FNP

## 2013-02-17 NOTE — Patient Instructions (Signed)

## 2013-02-25 NOTE — Telephone Encounter (Signed)
Patient was seen for appointment and got his refills by Stevan Born.

## 2013-03-07 ENCOUNTER — Other Ambulatory Visit: Payer: Self-pay | Admitting: Family Medicine

## 2013-03-10 ENCOUNTER — Other Ambulatory Visit: Payer: Self-pay | Admitting: Family Medicine

## 2013-03-12 ENCOUNTER — Telehealth: Payer: Self-pay | Admitting: Family Medicine

## 2013-03-13 ENCOUNTER — Other Ambulatory Visit: Payer: Self-pay | Admitting: Family Medicine

## 2013-03-13 MED ORDER — CLORAZEPATE DIPOTASSIUM 7.5 MG PO TABS: ORAL_TABLET | ORAL | Status: DC

## 2013-03-15 ENCOUNTER — Other Ambulatory Visit: Payer: Self-pay | Admitting: Family Medicine

## 2013-03-17 ENCOUNTER — Telehealth: Payer: Self-pay | Admitting: Family Medicine

## 2013-03-17 ENCOUNTER — Other Ambulatory Visit: Payer: Self-pay | Admitting: Family Medicine

## 2013-03-17 MED ORDER — CLORAZEPATE DIPOTASSIUM 7.5 MG PO TABS: ORAL_TABLET | ORAL | Status: DC

## 2013-03-18 NOTE — Telephone Encounter (Signed)
Pt notified RX for Tranxene ready for pick up

## 2013-03-28 ENCOUNTER — Ambulatory Visit: Payer: Medicare Other | Admitting: Nurse Practitioner

## 2013-03-31 ENCOUNTER — Encounter: Payer: Self-pay | Admitting: Family Medicine

## 2013-03-31 ENCOUNTER — Ambulatory Visit (INDEPENDENT_AMBULATORY_CARE_PROVIDER_SITE_OTHER): Payer: Medicare Other | Admitting: Family Medicine

## 2013-03-31 VITALS — BP 133/82 | HR 60 | Temp 97.4°F | Wt 276.0 lb

## 2013-03-31 DIAGNOSIS — R569 Unspecified convulsions: Secondary | ICD-10-CM | POA: Diagnosis not present

## 2013-03-31 DIAGNOSIS — G500 Trigeminal neuralgia: Secondary | ICD-10-CM

## 2013-03-31 DIAGNOSIS — G894 Chronic pain syndrome: Secondary | ICD-10-CM

## 2013-03-31 DIAGNOSIS — G8929 Other chronic pain: Secondary | ICD-10-CM | POA: Diagnosis not present

## 2013-03-31 DIAGNOSIS — I10 Essential (primary) hypertension: Secondary | ICD-10-CM

## 2013-03-31 MED ORDER — PREGABALIN 200 MG PO CAPS: 200.0000 mg | ORAL_CAPSULE | Freq: Every day | ORAL | Status: DC

## 2013-03-31 MED ORDER — PREGABALIN 100 MG PO CAPS: 100.0000 mg | ORAL_CAPSULE | Freq: Three times a day (TID) | ORAL | Status: DC

## 2013-03-31 MED ORDER — AMLODIPINE BESYLATE 10 MG PO TABS: 10.0000 mg | ORAL_TABLET | Freq: Every day | ORAL | Status: DC

## 2013-03-31 MED ORDER — CLORAZEPATE DIPOTASSIUM 7.5 MG PO TABS: ORAL_TABLET | ORAL | Status: DC

## 2013-03-31 MED ORDER — CARBAMAZEPINE 200 MG PO TABS: 400.0000 mg | ORAL_TABLET | Freq: Four times a day (QID) | ORAL | Status: DC

## 2013-03-31 NOTE — Progress Notes (Signed)
   Subjective:    Patient ID: Matthew [name], male    DOB: [birth year], 53 y.o.   MRN: [id number]  HPI  This 53 y.o. male presents for evaluation of follow up visit.  He has hx of basilar artery dilochoectasia. He had this discovered when he had trigeminal neuralgia and presented to ED and had CT done.  He has been worried about this.  He has hx of hypertension, chronic pain, and sz disorder. Review of Systems No chest pain, SOB, HA, dizziness, vision change, N/V, diarrhea, constipation, dysuria, urinary urgency or frequency, myalgias, arthralgias or rash.     Objective:   Physical Exam  Vital signs noted  Well developed well nourished male.  HEENT - Head atraumatic Normocephalic                Eyes - PERRLA, Conjuctiva - clear Sclera- Clear EOMI                Ears - EAC's Wnl TM's Wnl Gross Hearing WNL                Nose - Nares patent                 Throat - oropharanx wnl Respiratory - Lungs CTA bilateral Cardiac - RRR S1 and S2 without murmur GI - Abdomen soft Nontender and bowel sounds active x 4 Extremities - No edema. Neuro - Grossly intact.      Assessment & Plan:  Essential hypertension, benign - Plan: amLODipine (NORVASC) 10 MG tablet  Convulsions/seizures - Plan: carbamazepine (TEGRETOL) 200 MG tablet  Chronic pain - Plan: carbamazepine (TEGRETOL) 200 MG tablet, clorazepate (TRANXENE) 7.5 MG tablet, pregabalin (LYRICA) 100 MG capsule, DISCONTINUED: pregabalin (LYRICA) 100 MG capsule, DISCONTINUED: pregabalin (LYRICA) 100 MG capsule  Chronic pain syndrome - Plan: pregabalin (LYRICA) 200 MG capsule  Trigeminal neuralgia - Plan: pregabalin (LYRICA) 200 MG capsule  Basilar artery dilochoectasia - Explained that with bp control he will fine and this is an anatomical variant.  Lysbeth Penner FNP

## 2013-06-30 ENCOUNTER — Ambulatory Visit (INDEPENDENT_AMBULATORY_CARE_PROVIDER_SITE_OTHER): Payer: Medicare Other | Admitting: Family Medicine

## 2013-06-30 VITALS — BP 158/92 | HR 61 | Temp 97.4°F | Ht 69.0 in | Wt 284.4 lb

## 2013-06-30 DIAGNOSIS — G8929 Other chronic pain: Secondary | ICD-10-CM | POA: Diagnosis not present

## 2013-06-30 DIAGNOSIS — I10 Essential (primary) hypertension: Secondary | ICD-10-CM

## 2013-06-30 DIAGNOSIS — G894 Chronic pain syndrome: Secondary | ICD-10-CM | POA: Diagnosis not present

## 2013-06-30 DIAGNOSIS — R569 Unspecified convulsions: Secondary | ICD-10-CM | POA: Diagnosis not present

## 2013-06-30 DIAGNOSIS — G500 Trigeminal neuralgia: Secondary | ICD-10-CM

## 2013-06-30 DIAGNOSIS — J209 Acute bronchitis, unspecified: Secondary | ICD-10-CM

## 2013-06-30 MED ORDER — PREGABALIN 200 MG PO CAPS: 200.0000 mg | ORAL_CAPSULE | Freq: Every day | ORAL | Status: DC

## 2013-06-30 MED ORDER — PREDNISONE 10 MG PO TABS: ORAL_TABLET | ORAL | Status: DC

## 2013-06-30 MED ORDER — AZITHROMYCIN 250 MG PO TABS: ORAL_TABLET | ORAL | Status: DC

## 2013-06-30 MED ORDER — PREGABALIN 100 MG PO CAPS: 100.0000 mg | ORAL_CAPSULE | Freq: Three times a day (TID) | ORAL | Status: DC

## 2013-06-30 MED ORDER — CLORAZEPATE DIPOTASSIUM 7.5 MG PO TABS: ORAL_TABLET | ORAL | Status: DC

## 2013-06-30 MED ORDER — CARBAMAZEPINE 200 MG PO TABS: 400.0000 mg | ORAL_TABLET | Freq: Four times a day (QID) | ORAL | Status: DC

## 2013-06-30 MED ORDER — AMLODIPINE BESYLATE 10 MG PO TABS: 10.0000 mg | ORAL_TABLET | Freq: Every day | ORAL | Status: DC

## 2013-06-30 NOTE — Progress Notes (Signed)
   Subjective:    Patient ID: Matthew [name], male    DOB: [birth year], 53 y.o.   MRN: [id number]  HPI  This 53 y.o. male presents for evaluation of uri sx's and cough.  He is here for routine follow up. He has hx of hypertension, trigeminal neuralgia, and chronic pain.  Review of Systems    No chest pain, SOB, HA, dizziness, vision change, N/V, diarrhea, constipation, dysuria, urinary urgency or frequency, myalgias, arthralgias or rash.  Objective:   Physical Exam Vital signs noted  Well developed well nourished male.  HEENT - Head atraumatic Normocephalic                Eyes - PERRLA, Conjuctiva - clear Sclera- Clear EOMI                Ears - EAC's Wnl TM's Wnl Gross Hearing WNL                Nose - Nares patent                 Throat - oropharanx wnl Respiratory - Lungs CTA bilateral Cardiac - RRR S1 and S2 without murmur GI - Abdomen soft Nontender and bowel sounds active x 4 Extremities - No edema. Neuro - Grossly intact.       Assessment & Plan:  Essential hypertension, benign - Plan: amLODipine (NORVASC) 10 MG tablet  Convulsions/seizures - Plan: carbamazepine (TEGRETOL) 200 MG tablet  Chronic pain - Plan: carbamazepine (TEGRETOL) 200 MG tablet, clorazepate (TRANXENE) 7.5 MG tablet, pregabalin (LYRICA) 100 MG capsule, DISCONTINUED: pregabalin (LYRICA) 100 MG capsule  Chronic pain syndrome - Plan: pregabalin (LYRICA) 200 MG capsule  Trigeminal neuralgia - Plan: pregabalin (LYRICA) 200 MG capsule  Acute bronchitis - Plan: predniSONE (DELTASONE) 10 MG tablet, azithromycin (ZITHROMAX) 250 MG tablet  Lysbeth Penner FNP

## 2013-07-17 ENCOUNTER — Other Ambulatory Visit: Payer: Self-pay | Admitting: Family Medicine

## 2013-08-21 ENCOUNTER — Telehealth: Payer: Self-pay | Admitting: Family Medicine

## 2013-09-17 ENCOUNTER — Telehealth: Payer: Self-pay | Admitting: Family Medicine

## 2013-09-19 ENCOUNTER — Other Ambulatory Visit: Payer: Self-pay | Admitting: Family Medicine

## 2013-09-19 NOTE — Telephone Encounter (Signed)
Looks like he is on both doses of lyrica and looks like both were called in 5/15.

## 2013-10-07 ENCOUNTER — Ambulatory Visit (INDEPENDENT_AMBULATORY_CARE_PROVIDER_SITE_OTHER): Payer: Medicare Other | Admitting: Family Medicine

## 2013-10-07 ENCOUNTER — Encounter: Payer: Self-pay | Admitting: Family Medicine

## 2013-10-07 VITALS — BP 142/76 | HR 57 | Temp 98.8°F | Ht 69.0 in | Wt 267.0 lb

## 2013-10-07 DIAGNOSIS — M129 Arthropathy, unspecified: Secondary | ICD-10-CM

## 2013-10-07 DIAGNOSIS — M1990 Unspecified osteoarthritis, unspecified site: Secondary | ICD-10-CM

## 2013-10-07 MED ORDER — HYDROCODONE-ACETAMINOPHEN 5-325 MG PO TABS: 1.0000 | ORAL_TABLET | Freq: Four times a day (QID) | ORAL | Status: DC | PRN

## 2013-10-07 NOTE — Progress Notes (Signed)
   Subjective:    Patient ID: Matthew [name], male    DOB: 05-06-1960, 53 y.o.   MRN: [id number]  HPI C/o arthritis throughout and pain.  He c/o difficulty with arthritis and tylenol is not working    Review of Systems No chest pain, SOB, HA, dizziness, vision change, N/V, diarrhea, constipation, dysuria, urinary urgency or frequency, myalgias, arthralgias or rash.     Objective:   Physical Exam  Vital signs noted  Well developed well nourished male.  HEENT - Head atraumatic Normocephalic                Eyes - PERRLA, Conjuctiva - clear Sclera- Clear EOMI                Ears - EAC's Wnl TM's Wnl Gross Hearing WNL                Nose - Nares patent                 Throat - oropharanx wnl Respiratory - Lungs CTA bilateral Cardiac - RRR S1 and S2 without murmur GI - Abdomen soft Nontender and bowel sounds active x 4 Extremities - No edema. Neuro - Grossly intact.      Assessment & Plan:  Arthritis - Plan: HYDROcodone-acetaminophen (NORCO) 5-325 MG per tablet Followup in 3 months  Lysbeth Penner FNP

## 2013-11-07 ENCOUNTER — Telehealth: Payer: Self-pay | Admitting: *Deleted

## 2013-11-07 ENCOUNTER — Other Ambulatory Visit: Payer: Self-pay | Admitting: Family Medicine

## 2013-11-07 DIAGNOSIS — M1990 Unspecified osteoarthritis, unspecified site: Secondary | ICD-10-CM

## 2013-11-07 MED ORDER — HYDROCODONE-ACETAMINOPHEN 5-325 MG PO TABS: 1.0000 | ORAL_TABLET | Freq: Four times a day (QID) | ORAL | Status: DC | PRN

## 2013-11-07 NOTE — Telephone Encounter (Signed)
Last seen and filled 10/07/13. Rx will print

## 2013-11-07 NOTE — Telephone Encounter (Signed)
Pt notified RX for Hydrocodone/acetaminophen 5/325mg  is ready for pick up RX to front

## 2013-11-18 ENCOUNTER — Ambulatory Visit (INDEPENDENT_AMBULATORY_CARE_PROVIDER_SITE_OTHER): Payer: Medicare Other | Admitting: Family

## 2013-11-18 ENCOUNTER — Encounter: Payer: Self-pay | Admitting: Family

## 2013-11-18 VITALS — BP 142/84 | HR 66 | Temp 97.2°F | Ht 69.0 in | Wt 256.4 lb

## 2013-11-18 DIAGNOSIS — J069 Acute upper respiratory infection, unspecified: Secondary | ICD-10-CM

## 2013-11-18 MED ORDER — AZITHROMYCIN 250 MG PO TABS: ORAL_TABLET | ORAL | Status: DC

## 2013-11-18 MED ORDER — BENZONATATE 200 MG PO CAPS: 200.0000 mg | ORAL_CAPSULE | Freq: Three times a day (TID) | ORAL | Status: DC | PRN

## 2013-11-18 MED ORDER — METHYLPREDNISOLONE ACETATE 40 MG/ML IJ SUSP
40.0000 mg | Freq: Once | INTRAMUSCULAR | Status: AC
  Administered 2013-11-18: 40 mg via INTRAMUSCULAR

## 2013-11-18 NOTE — Progress Notes (Signed)
Subjective:    Patient ID: Matthew [name], male    DOB: January 27, 1961, 53 y.o.   MRN: [id number]  Cough This is a new problem. The current episode started in the past 7 days ("three or four days ago"). The problem has been gradually worsening. The problem occurs every few minutes. The cough is productive of sputum and productive of purulent sputum. Associated symptoms include nasal congestion, postnasal drip, rhinorrhea, a sore throat, shortness of breath and wheezing. Pertinent negatives include no chills, ear congestion, ear pain, fever, headaches or hemoptysis. The symptoms are aggravated by lying down. Risk factors for lung disease include smoking/tobacco exposure. He has tried rest and OTC cough suppressant for the symptoms. The treatment provided mild relief. His past medical history is significant for asthma. There is no history of COPD.      Review of Systems  Constitutional: Negative.  Negative for fever and chills.  HENT: Positive for postnasal drip, rhinorrhea and sore throat. Negative for ear pain.   Respiratory: Positive for cough, shortness of breath and wheezing. Negative for hemoptysis.   Cardiovascular: Negative.   Gastrointestinal: Negative.   Endocrine: Negative.   Genitourinary: Negative.   Musculoskeletal: Negative.   Neurological: Negative.  Negative for headaches.  Hematological: Negative.   Psychiatric/Behavioral: Negative.   All other systems reviewed and are negative.      Objective:   Physical Exam  Vitals reviewed. Constitutional: He is oriented to person, place, and time. He appears well-developed and well-nourished. No distress.  HENT:  Head: Normocephalic.  Right Ear: External ear normal.  Left Ear: External ear normal.  Eyes: Pupils are equal, round, and reactive to light. Right eye exhibits no discharge. Left eye exhibits no discharge.  Neck: Normal range of motion. Neck supple. No thyromegaly present.  Cardiovascular: Normal rate, regular rhythm,  normal heart sounds and intact distal pulses.   No murmur heard. Pulmonary/Chest: Effort normal and breath sounds normal. No respiratory distress. He has no wheezes.  Abdominal: Soft. Bowel sounds are normal. He exhibits no distension. There is no tenderness.  Musculoskeletal: Normal range of motion. He exhibits no edema and no tenderness.  Neurological: He is alert and oriented to person, place, and time. He has normal reflexes. No cranial nerve deficit.  Skin: Skin is warm and dry. No rash noted. No erythema.  Psychiatric: He has a normal mood and affect. His behavior is normal. Judgment and thought content normal.      BP 142/84  Pulse 66  Temp(Src) 97.2 F (36.2 C) (Oral)  Ht 5\' 9"  (1.753 m)  Wt 256 lb 6.4 oz (116.302 kg)  BMI 37.85 kg/m2     Assessment & Plan:  1. URI (upper respiratory infection) -- Take meds as prescribed - Use a cool mist humidifier  -Use saline nose sprays frequently -Saline irrigations of the nose can be very helpful if done frequently.  * 4X daily for 1 week*  * Use of a nettie pot can be helpful with this. Follow directions with this* -Force fluids -For any cough or congestion  Use plain Mucinex- regular strength or max strength is fine   * Children- consult with Pharmacist for dosing -For fever or aces or pains- take tylenol or ibuprofen appropriate for age and weight.  * for fevers greater than 101 orally you may alternate ibuprofen and tylenol every  3 hours. -Throat lozenges if help - azithromycin (ZITHROMAX) 250 MG tablet; Take 500mg  today then 250 mg for 4 days  Dispense: 6 tablet;  Refill: 0 - benzonatate (TESSALON) 200 MG capsule; Take 1 capsule (200 mg total) by mouth 3 (three) times daily as needed.  Dispense: 30 capsule; Refill: 1 - methylPREDNISolone acetate (DEPO-MEDROL) injection 40 mg; Inject 1 mL (40 mg total) into the muscle once.  Evelina Dun, FNP

## 2013-11-18 NOTE — Patient Instructions (Signed)
Upper Respiratory Infection, Adult An upper respiratory infection (URI) is also known as the common cold. It is often caused by a type of germ (virus). Colds are easily spread (contagious). You can pass it to others by kissing, coughing, sneezing, or drinking out of the same glass. Usually, you get better in 1 or 2 weeks.  HOME CARE   Only take medicine as told by your doctor.  Use a warm mist humidifier or breathe in steam from a hot shower.  Drink enough water and fluids to keep your pee (urine) clear or pale yellow.  Get plenty of rest.  Return to work when your temperature is back to normal or as told by your doctor. You may use a face mask and wash your hands to stop your cold from spreading. GET HELP RIGHT AWAY IF:   After the first few days, you feel you are getting worse.  You have questions about your medicine.  You have chills, shortness of breath, or brown or red spit (mucus).  You have yellow or brown snot (nasal discharge) or pain in the face, especially when you bend forward.  You have a fever, puffy (swollen) neck, pain when you swallow, or white spots in the back of your throat.  You have a bad headache, ear pain, sinus pain, or chest pain.  You have a high-pitched whistling sound when you breathe in and out (wheezing).  You have a lasting cough or cough up blood.  You have sore muscles or a stiff neck. MAKE SURE YOU:   Understand these instructions.  Will watch your condition.  Will get help right away if you are not doing well or get worse. Document Released: 07/19/2007 Document Revised: 04/24/2011 Document Reviewed: 05/07/2013 [employer] Patient Information 2015 [employer]. This information is not intended to replace advice given to you by your health care provider. Make sure you discuss any questions you have with your health care provider.  

## 2013-11-24 ENCOUNTER — Other Ambulatory Visit: Payer: Self-pay | Admitting: Family

## 2013-11-26 NOTE — Telephone Encounter (Signed)
Patient seen on 11-18-13. Requesting refill on antibiotic. Please advise

## 2013-12-10 ENCOUNTER — Other Ambulatory Visit: Payer: Self-pay | Admitting: *Deleted

## 2013-12-10 ENCOUNTER — Telehealth: Payer: Self-pay | Admitting: Family Medicine

## 2013-12-10 DIAGNOSIS — M1990 Unspecified osteoarthritis, unspecified site: Secondary | ICD-10-CM

## 2013-12-10 MED ORDER — HYDROCODONE-ACETAMINOPHEN 5-325 MG PO TABS: 1.0000 | ORAL_TABLET | Freq: Four times a day (QID) | ORAL | Status: DC | PRN

## 2013-12-10 NOTE — Telephone Encounter (Signed)
Last ov 11/18/13. Route to nurse pool. Please print if approved. Thanks.

## 2013-12-10 NOTE — Telephone Encounter (Signed)
Sent to [facility] for review.

## 2013-12-11 NOTE — Telephone Encounter (Signed)
Pt notified RX  For Hydrocodone is ready for pick up Verbalizes understanding

## 2013-12-27 ENCOUNTER — Other Ambulatory Visit: Payer: Self-pay | Admitting: Family Medicine

## 2013-12-29 ENCOUNTER — Other Ambulatory Visit: Payer: Self-pay | Admitting: Family Medicine

## 2014-01-06 ENCOUNTER — Telehealth: Payer: Self-pay | Admitting: Family Medicine

## 2014-01-07 NOTE — Telephone Encounter (Signed)
Please review and advise.

## 2014-01-07 NOTE — Telephone Encounter (Signed)
Please advise 

## 2014-01-07 NOTE — Telephone Encounter (Signed)
Pt notified Dietrich Pates will review on Monday Verbalizes understanding

## 2014-01-12 ENCOUNTER — Other Ambulatory Visit: Payer: Self-pay | Admitting: Family Medicine

## 2014-01-12 DIAGNOSIS — M1990 Unspecified osteoarthritis, unspecified site: Secondary | ICD-10-CM

## 2014-01-12 MED ORDER — HYDROCODONE-ACETAMINOPHEN 5-325 MG PO TABS: 1.0000 | ORAL_TABLET | Freq: Four times a day (QID) | ORAL | Status: DC | PRN

## 2014-01-12 NOTE — Telephone Encounter (Signed)
Matthew [name], script for hydrocodone ready.

## 2014-01-13 ENCOUNTER — Encounter: Payer: Self-pay | Admitting: Family Medicine

## 2014-01-13 ENCOUNTER — Ambulatory Visit (INDEPENDENT_AMBULATORY_CARE_PROVIDER_SITE_OTHER): Payer: Medicare Other | Admitting: Family Medicine

## 2014-01-13 ENCOUNTER — Ambulatory Visit (INDEPENDENT_AMBULATORY_CARE_PROVIDER_SITE_OTHER): Payer: Medicare Other | Admitting: *Deleted

## 2014-01-13 VITALS — BP 166/93 | HR 60 | Temp 97.0°F | Ht 69.0 in | Wt 252.2 lb

## 2014-01-13 DIAGNOSIS — E785 Hyperlipidemia, unspecified: Secondary | ICD-10-CM

## 2014-01-13 DIAGNOSIS — I10 Essential (primary) hypertension: Secondary | ICD-10-CM | POA: Diagnosis not present

## 2014-01-13 DIAGNOSIS — G8929 Other chronic pain: Secondary | ICD-10-CM

## 2014-01-13 DIAGNOSIS — J410 Simple chronic bronchitis: Secondary | ICD-10-CM

## 2014-01-13 DIAGNOSIS — M1990 Unspecified osteoarthritis, unspecified site: Secondary | ICD-10-CM | POA: Diagnosis not present

## 2014-01-13 DIAGNOSIS — Z23 Encounter for immunization: Secondary | ICD-10-CM | POA: Diagnosis not present

## 2014-01-13 MED ORDER — PREGABALIN 100 MG PO CAPS: 100.0000 mg | ORAL_CAPSULE | Freq: Three times a day (TID) | ORAL | Status: DC

## 2014-01-13 MED ORDER — BECLOMETHASONE DIPROPIONATE 80 MCG/ACT IN AERS: 2.0000 | INHALATION_SPRAY | Freq: Two times a day (BID) | RESPIRATORY_TRACT | Status: DC

## 2014-01-13 MED ORDER — ALBUTEROL SULFATE HFA 108 (90 BASE) MCG/ACT IN AERS: 2.0000 | INHALATION_SPRAY | Freq: Four times a day (QID) | RESPIRATORY_TRACT | Status: DC | PRN

## 2014-01-13 MED ORDER — AMLODIPINE BESYLATE 10 MG PO TABS: 10.0000 mg | ORAL_TABLET | Freq: Every day | ORAL | Status: DC

## 2014-01-13 MED ORDER — HYDROCHLOROTHIAZIDE 12.5 MG PO CAPS: 12.5000 mg | ORAL_CAPSULE | Freq: Every day | ORAL | Status: DC

## 2014-01-13 MED ORDER — CLORAZEPATE DIPOTASSIUM 7.5 MG PO TABS: ORAL_TABLET | ORAL | Status: DC

## 2014-01-13 NOTE — Progress Notes (Signed)
   Subjective:    Patient ID: Matthew [name], male    DOB: 03/07/1960, 53 y.o.   MRN: [id number]  HPI Patient is here for routine visit.  Review of Systems  Constitutional: Negative for fever.  HENT: Negative for ear pain.   Eyes: Negative for discharge.  Respiratory: Negative for cough.   Cardiovascular: Negative for chest pain.  Gastrointestinal: Negative for abdominal distention.  Endocrine: Negative for polyuria.  Genitourinary: Negative for difficulty urinating.  Musculoskeletal: Negative for gait problem and neck pain.  Skin: Negative for color change and rash.  Neurological: Negative for speech difficulty and headaches.  Psychiatric/Behavioral: Negative for agitation.       Objective:    BP 166/93 mmHg  Pulse 60  Temp(Src) 97 F (36.1 C) (Oral)  Ht _0  (1.753 m)  Wt 252 lb 3.2 oz (114.397 kg)  BMI 37.23 kg/m2 Physical Exam  Constitutional: He is oriented to person, place, and time. He appears well-developed and well-nourished.  HENT:  Head: Normocephalic and atraumatic.  Mouth/Throat: Oropharynx is clear and moist.  Eyes: Pupils are equal, round, and reactive to light.  Neck: Normal range of motion. Neck supple.  Cardiovascular: Normal rate and regular rhythm.   No murmur heard. Pulmonary/Chest: Effort normal and breath sounds normal.  Abdominal: Soft. Bowel sounds are normal. There is no tenderness.  Neurological: He is alert and oriented to person, place, and time.  Skin: Skin is warm and dry.  Psychiatric: He has a normal mood and affect.          Assessment & Plan:     ICD-9-CM ICD-10-CM   1. Essential hypertension, benign 401.1 I10 hydrochlorothiazide (MICROZIDE) 12.5 MG capsule     CMP14+EGFR     PSA, total and free     amLODipine (NORVASC) 10 MG tablet  2. Hyperlipidemia 272.4 E78.5 POCT CBC     Lipid panel  3. Arthritis 716.90 M19.90   4. Chronic pain 338.29 G89.29 pregabalin (LYRICA) 100 MG capsule     clorazepate (TRANXENE) 7.5 MG tablet    5. Simple chronic bronchitis 491.0 J41.0 albuterol (PROVENTIL HFA;VENTOLIN HFA) 108 (90 BASE) MCG/ACT inhaler     beclomethasone (QVAR) 80 MCG/ACT inhaler     Return if symptoms worsen or fail to improve.  Lysbeth Penner FNP

## 2014-01-22 ENCOUNTER — Other Ambulatory Visit: Payer: Self-pay | Admitting: Family Medicine

## 2014-01-23 NOTE — Telephone Encounter (Signed)
Last carbamazepine level was 08/19/12

## 2014-02-03 ENCOUNTER — Other Ambulatory Visit: Payer: Self-pay | Admitting: Family Medicine

## 2014-02-12 ENCOUNTER — Other Ambulatory Visit: Payer: Self-pay | Admitting: Family Medicine

## 2014-02-12 DIAGNOSIS — M1990 Unspecified osteoarthritis, unspecified site: Secondary | ICD-10-CM

## 2014-02-12 MED ORDER — HYDROCODONE-ACETAMINOPHEN 5-325 MG PO TABS: 1.0000 | ORAL_TABLET | Freq: Four times a day (QID) | ORAL | Status: DC | PRN

## 2014-02-12 NOTE — Telephone Encounter (Signed)
Last seen 01/13/14, last filled 01/12/14. Rx will print

## 2014-02-14 ENCOUNTER — Other Ambulatory Visit: Payer: Self-pay | Admitting: Family Medicine

## 2014-02-24 ENCOUNTER — Other Ambulatory Visit: Payer: Self-pay | Admitting: Family Medicine

## 2014-03-09 ENCOUNTER — Other Ambulatory Visit: Payer: Self-pay | Admitting: Family Medicine

## 2014-03-13 ENCOUNTER — Other Ambulatory Visit: Payer: Self-pay | Admitting: Family

## 2014-03-13 DIAGNOSIS — M1990 Unspecified osteoarthritis, unspecified site: Secondary | ICD-10-CM

## 2014-03-13 MED ORDER — HYDROCODONE-ACETAMINOPHEN 5-325 MG PO TABS: 1.0000 | ORAL_TABLET | Freq: Four times a day (QID) | ORAL | Status: DC | PRN

## 2014-03-13 NOTE — Telephone Encounter (Signed)
Left detailed msg stating rx is ready to be picked up.

## 2014-03-21 ENCOUNTER — Other Ambulatory Visit: Payer: Self-pay | Admitting: Family Medicine

## 2014-04-14 ENCOUNTER — Other Ambulatory Visit: Payer: Self-pay | Admitting: Family Medicine

## 2014-04-14 DIAGNOSIS — M1990 Unspecified osteoarthritis, unspecified site: Secondary | ICD-10-CM

## 2014-04-14 MED ORDER — HYDROCODONE-ACETAMINOPHEN 5-325 MG PO TABS: 1.0000 | ORAL_TABLET | Freq: Four times a day (QID) | ORAL | Status: DC | PRN

## 2014-04-14 NOTE — Telephone Encounter (Signed)
Last seen 01/13/14 by Sallyanne Havers, next appt 04/17/14 with you. Last filled 03/13/14. Rx will print

## 2014-04-14 NOTE — Telephone Encounter (Signed)
Rx ready for pick up. 

## 2014-04-14 NOTE — Telephone Encounter (Signed)
No voice mail set up, unable to leave a message. Home number no longer in service.  Left message at [facility] to have patient come to [facility] for hydrocodone script.

## 2014-04-17 ENCOUNTER — Ambulatory Visit (INDEPENDENT_AMBULATORY_CARE_PROVIDER_SITE_OTHER): Payer: Medicare Other | Admitting: Family

## 2014-04-17 ENCOUNTER — Encounter: Payer: Self-pay | Admitting: Family

## 2014-04-17 VITALS — BP 150/90 | HR 59 | Temp 97.0°F | Ht 69.0 in | Wt 230.0 lb

## 2014-04-17 DIAGNOSIS — F4323 Adjustment disorder with mixed anxiety and depressed mood: Secondary | ICD-10-CM

## 2014-04-17 DIAGNOSIS — J410 Simple chronic bronchitis: Secondary | ICD-10-CM

## 2014-04-17 DIAGNOSIS — J449 Chronic obstructive pulmonary disease, unspecified: Secondary | ICD-10-CM | POA: Insufficient documentation

## 2014-04-17 DIAGNOSIS — E785 Hyperlipidemia, unspecified: Secondary | ICD-10-CM | POA: Diagnosis not present

## 2014-04-17 DIAGNOSIS — Z8639 Personal history of other endocrine, nutritional and metabolic disease: Secondary | ICD-10-CM

## 2014-04-17 DIAGNOSIS — E559 Vitamin D deficiency, unspecified: Secondary | ICD-10-CM | POA: Diagnosis not present

## 2014-04-17 DIAGNOSIS — I10 Essential (primary) hypertension: Secondary | ICD-10-CM | POA: Diagnosis not present

## 2014-04-17 DIAGNOSIS — J42 Unspecified chronic bronchitis: Secondary | ICD-10-CM

## 2014-04-17 DIAGNOSIS — G8929 Other chronic pain: Secondary | ICD-10-CM | POA: Diagnosis not present

## 2014-04-17 DIAGNOSIS — Z1321 Encounter for screening for nutritional disorder: Secondary | ICD-10-CM | POA: Diagnosis not present

## 2014-04-17 DIAGNOSIS — M1990 Unspecified osteoarthritis, unspecified site: Secondary | ICD-10-CM | POA: Diagnosis not present

## 2014-04-17 DIAGNOSIS — G500 Trigeminal neuralgia: Secondary | ICD-10-CM

## 2014-04-17 DIAGNOSIS — M129 Arthropathy, unspecified: Secondary | ICD-10-CM | POA: Diagnosis not present

## 2014-04-17 MED ORDER — HYDROCHLOROTHIAZIDE 12.5 MG PO TABS: 12.5000 mg | ORAL_TABLET | Freq: Every day | ORAL | Status: DC

## 2014-04-17 MED ORDER — PREGABALIN 100 MG PO CAPS: 100.0000 mg | ORAL_CAPSULE | Freq: Three times a day (TID) | ORAL | Status: DC

## 2014-04-17 MED ORDER — HYDROCODONE-ACETAMINOPHEN 5-325 MG PO TABS: 1.0000 | ORAL_TABLET | Freq: Four times a day (QID) | ORAL | Status: DC | PRN

## 2014-04-17 MED ORDER — BECLOMETHASONE DIPROPIONATE 80 MCG/ACT IN AERS: 2.0000 | INHALATION_SPRAY | Freq: Two times a day (BID) | RESPIRATORY_TRACT | Status: DC

## 2014-04-17 MED ORDER — CARBAMAZEPINE 200 MG PO TABS: ORAL_TABLET | ORAL | Status: DC

## 2014-04-17 MED ORDER — CLORAZEPATE DIPOTASSIUM 7.5 MG PO TABS: ORAL_TABLET | ORAL | Status: DC

## 2014-04-17 MED ORDER — AMLODIPINE BESYLATE 10 MG PO TABS: 10.0000 mg | ORAL_TABLET | Freq: Every day | ORAL | Status: DC

## 2014-04-17 NOTE — Progress Notes (Signed)
Subjective:    Patient ID: OTHA MONICAL, male    DOB: 22-Oct-1960, 54 y.o.   MRN: [id number]  Hypertension This is a chronic problem. The current episode started more than 1 year ago. The problem has been waxing and waning since onset. The problem is uncontrolled. Associated symptoms include anxiety. Pertinent negatives include no headaches, palpitations, peripheral edema or shortness of breath. Risk factors for coronary artery disease include dyslipidemia, male gender, smoking/tobacco exposure and sedentary lifestyle. Past treatments include calcium channel blockers and diuretics. The current treatment provides moderate improvement. There is no history of kidney disease, CAD/MI, CVA, heart failure or a thyroid problem. There is no history of sleep apnea.  Trigeminal neuralgia Pt currently taking lyrica and norco 5-325 mg. Pt states this is controlling the pain. COPD Pt currently taking QVar and albuterol as needed. Pt told that he needs to take the Qvar BID everyday.     Review of Systems  Constitutional: Negative.   HENT: Negative.   Respiratory: Negative.  Negative for shortness of breath.   Cardiovascular: Negative.  Negative for palpitations.  Gastrointestinal: Negative.   Endocrine: Negative.   Genitourinary: Negative.   Musculoskeletal: Negative.   Neurological: Negative.  Negative for headaches.  Hematological: Negative.   Psychiatric/Behavioral: Negative.   All other systems reviewed and are negative.      Objective:   Physical Exam  Constitutional: He is oriented to person, place, and time. He appears well-developed and well-nourished. No distress.  HENT:  Head: Normocephalic.  Right Ear: External ear normal.  Left Ear: External ear normal.  Mouth/Throat: Oropharynx is clear and moist.  Eyes: Pupils are equal, round, and reactive to light. Right eye exhibits no discharge. Left eye exhibits no discharge.  Neck: Normal range of motion. Neck supple. No thyromegaly  present.  Cardiovascular: Normal rate, regular rhythm, normal heart sounds and intact distal pulses.   No murmur heard. Pulmonary/Chest: Effort normal. No respiratory distress. He has wheezes.  Coarse breath sounds bilaterally    Abdominal: Soft. Bowel sounds are normal. He exhibits no distension. There is no tenderness.  Musculoskeletal: Normal range of motion. He exhibits no edema or tenderness.  Neurological: He is alert and oriented to person, place, and time. He has normal reflexes. No cranial nerve deficit.  Skin: Skin is warm and dry. No rash noted. No erythema.  Psychiatric: He has a normal mood and affect. His behavior is normal. Judgment and thought content normal.  Vitals reviewed.   BP 150/90 mmHg  Pulse 59  Temp(Src) 97 F (36.1 C) (Oral)  Ht _0  (1.753 m)  Wt 230 lb (104.327 kg)  BMI 33.95 kg/m2       Assessment & Plan:  1. Essential hypertension -Pt states he has been taking his norvasc but not his HTCZ- Refilled both and told pt to start taking both  CMP14+EGFR - amLODipine (NORVASC) 10 MG tablet; Take 1 tablet (10 mg total) by mouth daily.  Dispense: 90 tablet; Refill: 4 - hydrochlorothiazide (HYDRODIURIL) 12.5 MG tablet; Take 1 tablet (12.5 mg total) by mouth daily.  Dispense: 90 tablet; Refill: 3  2. Adjustment disorder with mixed anxiety and depressed mood - CMP14+EGFR  3. Trigeminal neuralgia - CMP14+EGFR  4. Essential hypertension, benign - CMP14+EGFR  5. Simple chronic bronchitis  6. Chronic pain - CMP14+EGFR - clorazepate (TRANXENE) 7.5 MG tablet; One po qid  Dispense: 120 tablet; Refill: 3 - pregabalin (LYRICA) 100 MG capsule; Take 1 capsule (100 mg total) by mouth 3 (three)  times daily.  Dispense: 270 capsule; Refill: 4  7. Arthritis - CMP14+EGFR - HYDROcodone-acetaminophen (NORCO) 5-325 MG per tablet; Take 1 tablet by mouth every 6 (six) hours as needed for moderate pain.  Dispense: 60 tablet; Refill: 0  8. Hx of hyperlipidemia - Lipid  panel  9. Encounter for vitamin deficiency screening - Vit D  25 hydroxy (rtn osteoporosis monitoring)  10. Chronic bronchitis, unspecified chronic bronchitis type - beclomethasone (QVAR) 80 MCG/ACT inhaler; Inhale 2 puffs into the lungs 2 (two) times daily.  Dispense: 3 Inhaler; Refill: 4  11. Hyperlipidemia - Lipid panel   Continue all meds Labs pending Health Maintenance reviewed Diet and exercise encouraged RTO 2 weeks for BP and COPD  Evelina Dun, FNP

## 2014-04-17 NOTE — Patient Instructions (Signed)

## 2014-04-18 ENCOUNTER — Other Ambulatory Visit: Payer: Self-pay | Admitting: Family

## 2014-04-18 DIAGNOSIS — E559 Vitamin D deficiency, unspecified: Secondary | ICD-10-CM | POA: Insufficient documentation

## 2014-04-18 DIAGNOSIS — E785 Hyperlipidemia, unspecified: Secondary | ICD-10-CM | POA: Insufficient documentation

## 2014-04-18 LAB — CMP14+EGFR
A/G RATIO: 1.6 (ref 1.1–2.5)
ALK PHOS: 104 IU/L (ref 39–117)
ALT: 30 IU/L (ref 0–44)
AST: 21 IU/L (ref 0–40)
Albumin: 4.3 g/dL (ref 3.5–5.5)
BUN/Creatinine Ratio: 9 (ref 9–20)
BUN: 6 mg/dL (ref 6–24)
Bilirubin Total: 0.3 mg/dL (ref 0.0–1.2)
CALCIUM: 8.8 mg/dL (ref 8.7–10.2)
CO2: 26 mmol/L (ref 18–29)
Chloride: 99 mmol/L (ref 97–108)
Creatinine, Ser: 0.66 mg/dL — ABNORMAL LOW (ref 0.76–1.27)
GFR calc Af Amer: 127 mL/min/{1.73_m2} (ref >59)
GFR, EST NON AFRICAN AMERICAN: 110 mL/min/{1.73_m2} (ref >59)
Globulin, Total: 2.7 g/dL (ref 1.5–4.5)
Glucose: 84 mg/dL (ref 65–99)
POTASSIUM: 3.9 mmol/L (ref 3.5–5.2)
SODIUM: 139 mmol/L (ref 134–144)
Total Protein: 7 g/dL (ref 6.0–8.5)

## 2014-04-18 LAB — LIPID PANEL
CHOLESTEROL TOTAL: 231 mg/dL — AB (ref 100–199)
Chol/HDL Ratio: 5.3 ratio units — ABNORMAL HIGH (ref 0.0–5.0)
HDL: 44 mg/dL (ref >39)
LDL Calculated: 151 mg/dL — ABNORMAL HIGH (ref 0–99)
Triglycerides: 181 mg/dL — ABNORMAL HIGH (ref 0–149)
VLDL Cholesterol Cal: 36 mg/dL (ref 5–40)

## 2014-04-18 LAB — VITAMIN D 25 HYDROXY (VIT D DEFICIENCY, FRACTURES): Vit D, 25-Hydroxy: 8.7 ng/mL — ABNORMAL LOW (ref 30.0–100.0)

## 2014-04-18 MED ORDER — VITAMIN D (ERGOCALCIFEROL) 1.25 MG (50000 UNIT) PO CAPS: 50000.0000 [IU] | ORAL_CAPSULE | ORAL | Status: DC

## 2014-04-18 MED ORDER — ATORVASTATIN CALCIUM 40 MG PO TABS: 40.0000 mg | ORAL_TABLET | Freq: Every day | ORAL | Status: DC

## 2014-05-04 ENCOUNTER — Ambulatory Visit: Payer: Medicare Other | Admitting: Family

## 2014-05-21 ENCOUNTER — Ambulatory Visit (INDEPENDENT_AMBULATORY_CARE_PROVIDER_SITE_OTHER): Payer: Medicare Other | Admitting: Family

## 2014-05-21 ENCOUNTER — Encounter: Payer: Self-pay | Admitting: Family

## 2014-05-21 VITALS — BP 136/84 | HR 63 | Temp 97.9°F | Ht 69.0 in | Wt 220.8 lb

## 2014-05-21 DIAGNOSIS — I10 Essential (primary) hypertension: Secondary | ICD-10-CM | POA: Diagnosis not present

## 2014-05-21 DIAGNOSIS — M546 Pain in thoracic spine: Secondary | ICD-10-CM

## 2014-05-21 MED ORDER — METHYLPREDNISOLONE ACETATE 80 MG/ML IJ SUSP
80.0000 mg | Freq: Once | INTRAMUSCULAR | Status: AC
  Administered 2014-05-21: 80 mg via INTRAMUSCULAR

## 2014-05-21 MED ORDER — HYDROCODONE-ACETAMINOPHEN 7.5-325 MG PO TABS: 1.0000 | ORAL_TABLET | Freq: Four times a day (QID) | ORAL | Status: DC | PRN

## 2014-05-21 NOTE — Addendum Note (Signed)
Addended by: Earlene Plater on: 05/21/2014 04:26 PM   Modules accepted: Miquel Dunn

## 2014-05-21 NOTE — Progress Notes (Signed)
Subjective:    Patient ID: Matthew [name], male    DOB: [birth year], 54 y.o.   MRN: [id number]  Pt presents to the office to recheck HTN. Pt's BP at goal today! Hypertension This is a chronic problem. The current episode started more than 1 year ago. The problem has been resolved since onset. The problem is controlled. Pertinent negatives include no anxiety, headaches, palpitations or peripheral edema. Risk factors for coronary artery disease include dyslipidemia, family history, male gender and sedentary lifestyle. Past treatments include calcium channel blockers and diuretics. The current treatment provides moderate improvement. There is no history of kidney disease, CAD/MI, CVA, heart failure or a thyroid problem. There is no history of sleep apnea.  Back Pain This is a new problem. The current episode started in the past 7 days. The problem occurs constantly. The problem has been waxing and waning since onset. The pain is present in the thoracic spine. The quality of the pain is described as aching. The pain is at a severity of 3/10. The pain is moderate. The symptoms are aggravated by twisting and position. Pertinent negatives include no bladder incontinence, bowel incontinence, fever, headaches, numbness or tingling. He has tried NSAIDs and analgesics for the symptoms. The treatment provided mild relief.      Review of Systems  Constitutional: Negative.  Negative for fever.  HENT: Negative.   Respiratory: Negative.   Cardiovascular: Negative.  Negative for palpitations.  Gastrointestinal: Negative.  Negative for bowel incontinence.  Endocrine: Negative.   Genitourinary: Negative.  Negative for bladder incontinence.  Musculoskeletal: Positive for back pain.  Neurological: Negative.  Negative for tingling, numbness and headaches.  Hematological: Negative.   Psychiatric/Behavioral: Negative.   All other systems reviewed and are negative.      Objective:   Physical Exam    Constitutional: He is oriented to person, place, and time. He appears well-developed and well-nourished. No distress.  HENT:  Head: Normocephalic.  Right Ear: External ear normal.  Left Ear: External ear normal.  Nose: Nose normal.  Mouth/Throat: Oropharynx is clear and moist.  Eyes: Pupils are equal, round, and reactive to light. Right eye exhibits no discharge. Left eye exhibits no discharge.  Neck: Normal range of motion. Neck supple. No thyromegaly present.  Cardiovascular: Normal rate, regular rhythm, normal heart sounds and intact distal pulses.   No murmur heard. Pulmonary/Chest: Effort normal and breath sounds normal. No respiratory distress. He has no wheezes.  Abdominal: Soft. Bowel sounds are normal. He exhibits no distension. There is no tenderness.  Musculoskeletal: Normal range of motion. He exhibits no edema or tenderness.  Neurological: He is alert and oriented to person, place, and time. He has normal reflexes. No cranial nerve deficit.  Skin: Skin is warm and dry. No rash noted. No erythema.  Psychiatric: He has a normal mood and affect. His behavior is normal. Judgment and thought content normal.  Vitals reviewed.     BP 136/84 mmHg  Pulse 63  Temp(Src) 97.9 F (36.6 C) (Oral)  Ht $R'5\' 9"'Jq$  (1.753 m)  Wt 220 lb 12.8 oz (100.154 kg)  BMI 32.59 kg/m2     Assessment & Plan:  1. Essential hypertension -Dash diet information given -Exercise encouraged - Stress Management  -Continue current meds -RTO in 3 months - BMP8+EGFR  2. Midline thoracic back pain -Rest -Pain medication increased to Norco increased to 7.5/$RemoveBeforeDE'325mg'foQlzZAfNIIBMrs$  from 5/$Remov'325mg'lOuwZh$  - HYDROcodone-acetaminophen (NORCO) 7.5-325 MG per tablet; Take 1 tablet by mouth every 6 (six) hours as needed for  moderate pain.  Dispense: 45 tablet; Refill: 0 - methylPREDNISolone acetate (DEPO-MEDROL) injection 80 mg; Inject 1 mL (80 mg total) into the muscle once.  Evelina Dun, FNP

## 2014-05-21 NOTE — Patient Instructions (Signed)

## 2014-05-22 ENCOUNTER — Telehealth: Payer: Self-pay | Admitting: *Deleted

## 2014-05-22 LAB — BMP8+EGFR
BUN / CREAT RATIO: 12 (ref 9–20)
BUN: 8 mg/dL (ref 6–24)
CO2: 29 mmol/L (ref 18–29)
CREATININE: 0.67 mg/dL — AB (ref 0.76–1.27)
Calcium: 9 mg/dL (ref 8.7–10.2)
Chloride: 91 mmol/L — ABNORMAL LOW (ref 97–108)
GFR calc Af Amer: 126 mL/min/{1.73_m2} (ref >59)
GFR calc non Af Amer: 109 mL/min/{1.73_m2} (ref >59)
Glucose: 132 mg/dL — ABNORMAL HIGH (ref 65–99)
Potassium: 3.7 mmol/L (ref 3.5–5.2)
SODIUM: 136 mmol/L (ref 134–144)

## 2014-05-22 NOTE — Telephone Encounter (Signed)
-----   Message from [name], [place] sent at 05/22/2014  8:57 AM EDT ----- Kidney  function stable

## 2014-05-22 NOTE — Progress Notes (Signed)
Na/ww 4/8

## 2014-05-25 ENCOUNTER — Encounter: Payer: Self-pay | Admitting: Family

## 2014-06-19 ENCOUNTER — Telehealth: Payer: Self-pay | Admitting: Family

## 2014-06-19 DIAGNOSIS — M546 Pain in thoracic spine: Secondary | ICD-10-CM

## 2014-06-19 MED ORDER — HYDROCODONE-ACETAMINOPHEN 7.5-325 MG PO TABS: 1.0000 | ORAL_TABLET | Freq: Four times a day (QID) | ORAL | Status: DC | PRN

## 2014-06-19 NOTE — Telephone Encounter (Signed)
Patient aware rx is ready to be picked up 

## 2014-06-19 NOTE — Telephone Encounter (Signed)
RX ready for pick up 

## 2014-07-16 ENCOUNTER — Telehealth: Payer: Self-pay | Admitting: Family

## 2014-07-16 DIAGNOSIS — M546 Pain in thoracic spine: Secondary | ICD-10-CM

## 2014-07-16 MED ORDER — HYDROCODONE-ACETAMINOPHEN 7.5-325 MG PO TABS: 1.0000 | ORAL_TABLET | Freq: Four times a day (QID) | ORAL | Status: DC | PRN

## 2014-07-16 NOTE — Telephone Encounter (Signed)
Patient aware that rx is ready to be picked up.  

## 2014-07-16 NOTE — Telephone Encounter (Signed)
RX ready for pick up 

## 2014-08-12 ENCOUNTER — Other Ambulatory Visit: Payer: Self-pay | Admitting: Family

## 2014-08-12 DIAGNOSIS — M546 Pain in thoracic spine: Secondary | ICD-10-CM

## 2014-08-12 MED ORDER — HYDROCODONE-ACETAMINOPHEN 7.5-325 MG PO TABS: 1.0000 | ORAL_TABLET | Freq: Four times a day (QID) | ORAL | Status: DC | PRN

## 2014-08-12 NOTE — Telephone Encounter (Signed)
RX ready for pick up 

## 2014-08-12 NOTE — Telephone Encounter (Signed)
Pt notified Rx is ready for pick up Rx to front for pt pick up

## 2014-08-24 ENCOUNTER — Ambulatory Visit (INDEPENDENT_AMBULATORY_CARE_PROVIDER_SITE_OTHER): Payer: Medicare Other | Admitting: Family

## 2014-08-24 ENCOUNTER — Encounter: Payer: Self-pay | Admitting: Family

## 2014-08-24 VITALS — BP 140/83 | HR 53 | Temp 97.4°F | Ht 69.0 in | Wt 229.2 lb

## 2014-08-24 DIAGNOSIS — N529 Male erectile dysfunction, unspecified: Secondary | ICD-10-CM | POA: Diagnosis not present

## 2014-08-24 DIAGNOSIS — G8929 Other chronic pain: Secondary | ICD-10-CM

## 2014-08-24 DIAGNOSIS — F4323 Adjustment disorder with mixed anxiety and depressed mood: Secondary | ICD-10-CM

## 2014-08-24 DIAGNOSIS — G500 Trigeminal neuralgia: Secondary | ICD-10-CM

## 2014-08-24 DIAGNOSIS — E785 Hyperlipidemia, unspecified: Secondary | ICD-10-CM | POA: Diagnosis not present

## 2014-08-24 DIAGNOSIS — J42 Unspecified chronic bronchitis: Secondary | ICD-10-CM | POA: Diagnosis not present

## 2014-08-24 DIAGNOSIS — I10 Essential (primary) hypertension: Secondary | ICD-10-CM | POA: Diagnosis not present

## 2014-08-24 DIAGNOSIS — E559 Vitamin D deficiency, unspecified: Secondary | ICD-10-CM

## 2014-08-24 DIAGNOSIS — M546 Pain in thoracic spine: Secondary | ICD-10-CM | POA: Diagnosis not present

## 2014-08-24 MED ORDER — VITAMIN D (ERGOCALCIFEROL) 1.25 MG (50000 UNIT) PO CAPS: 50000.0000 [IU] | ORAL_CAPSULE | ORAL | Status: DC

## 2014-08-24 MED ORDER — HYDROCODONE-ACETAMINOPHEN 7.5-325 MG PO TABS: 1.0000 | ORAL_TABLET | Freq: Four times a day (QID) | ORAL | Status: DC | PRN

## 2014-08-24 MED ORDER — CLORAZEPATE DIPOTASSIUM 7.5 MG PO TABS: ORAL_TABLET | ORAL | Status: DC

## 2014-08-24 MED ORDER — BECLOMETHASONE DIPROPIONATE 80 MCG/ACT IN AERS: 2.0000 | INHALATION_SPRAY | Freq: Two times a day (BID) | RESPIRATORY_TRACT | Status: DC

## 2014-08-24 MED ORDER — HYDROCHLOROTHIAZIDE 12.5 MG PO TABS: 12.5000 mg | ORAL_TABLET | Freq: Every day | ORAL | Status: DC

## 2014-08-24 MED ORDER — PREGABALIN 100 MG PO CAPS: 100.0000 mg | ORAL_CAPSULE | Freq: Three times a day (TID) | ORAL | Status: DC

## 2014-08-24 MED ORDER — AMLODIPINE BESYLATE 10 MG PO TABS: 10.0000 mg | ORAL_TABLET | Freq: Every day | ORAL | Status: DC

## 2014-08-24 MED ORDER — TADALAFIL 5 MG PO TABS: 5.0000 mg | ORAL_TABLET | Freq: Every day | ORAL | Status: DC | PRN

## 2014-08-24 MED ORDER — CARBAMAZEPINE 200 MG PO TABS: ORAL_TABLET | ORAL | Status: DC

## 2014-08-24 MED ORDER — ATORVASTATIN CALCIUM 40 MG PO TABS: 40.0000 mg | ORAL_TABLET | Freq: Every day | ORAL | Status: DC

## 2014-08-24 NOTE — Progress Notes (Signed)
Subjective:    Patient ID: Matthew [name], male    DOB: 1960/03/10, 54 y.o.   MRN: [id number] Pt presents to the office today for chronic follow up. Hyperlipidemia This is a chronic problem. The current episode started more than 1 year ago. The problem is uncontrolled. Recent lipid tests were reviewed and are high. He has no history of diabetes. Factors aggravating his hyperlipidemia include smoking. Pertinent negatives include no leg pain. Current antihyperlipidemic treatment includes statins. Risk factors for coronary artery disease include dyslipidemia, hypertension, male sex and a sedentary lifestyle.  Hypertension This is a chronic problem. The current episode started more than 1 year ago. The problem has been resolved since onset. The problem is controlled. Pertinent negatives include no anxiety, headaches, palpitations or peripheral edema. Risk factors for coronary artery disease include dyslipidemia, family history, male gender and sedentary lifestyle. Past treatments include calcium channel blockers and diuretics. The current treatment provides moderate improvement. There is no history of kidney disease, CAD/MI, CVA, heart failure or a thyroid problem. There is no history of sleep apnea.  Back Pain This is a new problem. The current episode started in the past 7 days. The problem occurs constantly. The problem has been waxing and waning since onset. The pain is present in the thoracic spine. The quality of the pain is described as aching. The pain is at a severity of 3/10. The pain is moderate. The symptoms are aggravated by twisting and position. Pertinent negatives include no bladder incontinence, bowel incontinence, fever, headaches, leg pain, numbness or tingling. He has tried NSAIDs and analgesics for the symptoms. The treatment provided mild relief.  Anxiety Presents for follow-up visit. Symptoms include nervous/anxious behavior. Patient reports no decreased concentration, excessive worry,  insomnia or palpitations. Symptoms occur occasionally. The severity of symptoms is mild.   His past medical history is significant for anxiety/panic attacks and depression. The treatment provided mild relief. Compliance with prior treatments has been good.  COPD PT states he takes his QVAR BID. Pt states it helps. Pt states he is smoking a pack to pack and half a day. Pt states "if I didn't smoke I would go crazy". Pt is not ready to quit.    Review of Systems  Constitutional: Negative.  Negative for fever.  HENT: Negative.   Respiratory: Negative.   Cardiovascular: Negative.  Negative for palpitations.  Gastrointestinal: Negative.  Negative for bowel incontinence.  Endocrine: Negative.   Genitourinary: Negative.  Negative for bladder incontinence.  Musculoskeletal: Positive for back pain.  Neurological: Negative.  Negative for tingling, numbness and headaches.  Hematological: Negative.   Psychiatric/Behavioral: Negative for decreased concentration. The patient is nervous/anxious. The patient does not have insomnia.   All other systems reviewed and are negative.      Objective:   Physical Exam  Constitutional: He is oriented to person, place, and time. He appears well-developed and well-nourished. No distress.  HENT:  Head: Normocephalic.  Right Ear: External ear normal.  Left Ear: External ear normal.  Nose: Nose normal.  Mouth/Throat: Oropharynx is clear and moist.  Eyes: Pupils are equal, round, and reactive to light. Right eye exhibits no discharge. Left eye exhibits no discharge.  Neck: Normal range of motion. Neck supple. No thyromegaly present.  Cardiovascular: Normal rate, regular rhythm, normal heart sounds and intact distal pulses.   No murmur heard. Pulmonary/Chest: Effort normal. No respiratory distress. He has wheezes.  Abdominal: Soft. Bowel sounds are normal. He exhibits no distension. There is no tenderness.  Musculoskeletal: Normal range of motion. He exhibits  no edema or tenderness.  Neurological: He is alert and oriented to person, place, and time. He has normal reflexes. No cranial nerve deficit.  Skin: Skin is warm and dry. No rash noted. No erythema.  Psychiatric: He has a normal mood and affect. His behavior is normal. Judgment and thought content normal.  Vitals reviewed.    BP 144/85 mmHg  Pulse 52  Temp(Src) 97.4 F (36.3 C) (Oral)  Ht $R'5\' 9"'sb$  (1.753 m)  Wt 229 lb 3.2 oz (103.964 kg)  BMI 33.83 kg/m2      Assessment & Plan:  1. Essential hypertension - CMP14+EGFR - amLODipine (NORVASC) 10 MG tablet; Take 1 tablet (10 mg total) by mouth daily.  Dispense: 90 tablet; Refill: 4 - hydrochlorothiazide (HYDRODIURIL) 12.5 MG tablet; Take 1 tablet (12.5 mg total) by mouth daily.  Dispense: 90 tablet; Refill: 3  2. Chronic bronchitis, unspecified chronic bronchitis type - CMP14+EGFR - beclomethasone (QVAR) 80 MCG/ACT inhaler; Inhale 2 puffs into the lungs 2 (two) times daily.  Dispense: 3 Inhaler; Refill: 4  3. Adjustment disorder with mixed anxiety and depressed mood - CMP14+EGFR  4. Hyperlipidemia - CMP14+EGFR - Lipid panel - atorvastatin (LIPITOR) 40 MG tablet; Take 1 tablet (40 mg total) by mouth daily.  Dispense: 90 tablet; Refill: 3  5. Vitamin D deficiency - CMP14+EGFR - Vit D  25 hydroxy (rtn osteoporosis monitoring) - Vitamin D, Ergocalciferol, (DRISDOL) 50000 UNITS CAPS capsule; Take 1 capsule (50,000 Units total) by mouth every 7 (seven) days.  Dispense: 12 capsule; Refill: 3  6. Chronic pain - CMP14+EGFR - clorazepate (TRANXENE) 7.5 MG tablet; One po qid  Dispense: 120 tablet; Refill: 3 - pregabalin (LYRICA) 100 MG capsule; Take 1 capsule (100 mg total) by mouth 3 (three) times daily.  Dispense: 270 capsule; Refill: 4  7. Midline thoracic back pain  - CMP14+EGFR - HYDROcodone-acetaminophen (NORCO) 7.5-325 MG per tablet; Take 1 tablet by mouth every 6 (six) hours as needed for moderate pain.  Dispense: 60 tablet;  Refill: 0  8. Trigeminal neuralgia - CMP14+EGFR - carbamazepine (TEGRETOL) 200 MG tablet; TAKE 2 TABLETS BY MOUTH 4 TIMES A DAY  Dispense: 240 tablet; Refill: 4 - pregabalin (LYRICA) 100 MG capsule; Take 1 capsule (100 mg total) by mouth 3 (three) times daily.  Dispense: 270 capsule; Refill: 4  9. Erectile dysfunction, unspecified erectile dysfunction type - tadalafil (CIALIS) 5 MG tablet; Take 1 tablet (5 mg total) by mouth daily as needed for erectile dysfunction.  Dispense: 10 tablet; Refill: 0   Continue all meds Labs pending Health Maintenance reviewed Diet and exercise encouraged RTO 3 months  Evelina Dun, FNP

## 2014-08-24 NOTE — Patient Instructions (Signed)

## 2014-08-25 ENCOUNTER — Other Ambulatory Visit: Payer: Self-pay | Admitting: Family

## 2014-08-25 DIAGNOSIS — E559 Vitamin D deficiency, unspecified: Secondary | ICD-10-CM

## 2014-08-25 LAB — CMP14+EGFR
A/G RATIO: 1.6 (ref 1.1–2.5)
ALBUMIN: 4 g/dL (ref 3.5–5.5)
ALT: 15 IU/L (ref 0–44)
AST: 13 IU/L (ref 0–40)
Alkaline Phosphatase: 106 IU/L (ref 39–117)
BUN/Creatinine Ratio: 10 (ref 9–20)
BUN: 7 mg/dL (ref 6–24)
CHLORIDE: 99 mmol/L (ref 97–108)
CO2: 25 mmol/L (ref 18–29)
Calcium: 8.6 mg/dL — ABNORMAL LOW (ref 8.7–10.2)
Creatinine, Ser: 0.7 mg/dL — ABNORMAL LOW (ref 0.76–1.27)
GFR, EST AFRICAN AMERICAN: 123 mL/min/{1.73_m2} (ref >59)
GFR, EST NON AFRICAN AMERICAN: 106 mL/min/{1.73_m2} (ref >59)
Globulin, Total: 2.5 g/dL (ref 1.5–4.5)
Glucose: 76 mg/dL (ref 65–99)
Potassium: 4.3 mmol/L (ref 3.5–5.2)
SODIUM: 138 mmol/L (ref 134–144)
Total Protein: 6.5 g/dL (ref 6.0–8.5)

## 2014-08-25 LAB — LIPID PANEL
CHOLESTEROL TOTAL: 172 mg/dL (ref 100–199)
Chol/HDL Ratio: 2.8 ratio units (ref 0.0–5.0)
HDL: 62 mg/dL (ref >39)
LDL CALC: 86 mg/dL (ref 0–99)
Triglycerides: 118 mg/dL (ref 0–149)
VLDL Cholesterol Cal: 24 mg/dL (ref 5–40)

## 2014-08-25 LAB — VITAMIN D 25 HYDROXY (VIT D DEFICIENCY, FRACTURES): Vit D, 25-Hydroxy: 16.1 ng/mL — ABNORMAL LOW (ref 30.0–100.0)

## 2014-08-25 MED ORDER — VITAMIN D (ERGOCALCIFEROL) 1.25 MG (50000 UNIT) PO CAPS: 50000.0000 [IU] | ORAL_CAPSULE | ORAL | Status: DC

## 2014-10-02 ENCOUNTER — Other Ambulatory Visit: Payer: Self-pay | Admitting: Family

## 2014-10-02 DIAGNOSIS — M546 Pain in thoracic spine: Secondary | ICD-10-CM

## 2014-10-02 MED ORDER — HYDROCODONE-ACETAMINOPHEN 7.5-325 MG PO TABS: 1.0000 | ORAL_TABLET | Freq: Four times a day (QID) | ORAL | Status: DC | PRN

## 2014-10-02 NOTE — Telephone Encounter (Signed)
Last seen and filled 08/24/14. Rx will print

## 2014-10-02 NOTE — Telephone Encounter (Signed)
Pt aware written Rx is at front desk ready for pickup  

## 2014-10-02 NOTE — Telephone Encounter (Signed)
RX ready for pick up 

## 2014-11-02 ENCOUNTER — Other Ambulatory Visit: Payer: Self-pay | Admitting: Family

## 2014-11-02 DIAGNOSIS — M546 Pain in thoracic spine: Secondary | ICD-10-CM

## 2014-11-02 MED ORDER — HYDROCODONE-ACETAMINOPHEN 7.5-325 MG PO TABS: 1.0000 | ORAL_TABLET | Freq: Four times a day (QID) | ORAL | Status: DC | PRN

## 2014-11-02 NOTE — Telephone Encounter (Signed)
Aware,script for pain medication ready. 

## 2014-11-02 NOTE — Telephone Encounter (Signed)
RX ready for pick up 

## 2014-11-02 NOTE — Telephone Encounter (Signed)
Last filled 10/02/14, last seen 08/24/14. Rx will print

## 2014-11-24 ENCOUNTER — Encounter: Payer: Self-pay | Admitting: Family

## 2014-11-24 ENCOUNTER — Ambulatory Visit (INDEPENDENT_AMBULATORY_CARE_PROVIDER_SITE_OTHER): Payer: Medicare Other | Admitting: Family

## 2014-11-24 VITALS — BP 138/81 | HR 54 | Temp 97.2°F | Ht 69.0 in | Wt 246.6 lb

## 2014-11-24 DIAGNOSIS — J42 Unspecified chronic bronchitis: Secondary | ICD-10-CM

## 2014-11-24 DIAGNOSIS — M1990 Unspecified osteoarthritis, unspecified site: Secondary | ICD-10-CM | POA: Diagnosis not present

## 2014-11-24 DIAGNOSIS — F4323 Adjustment disorder with mixed anxiety and depressed mood: Secondary | ICD-10-CM

## 2014-11-24 DIAGNOSIS — I10 Essential (primary) hypertension: Secondary | ICD-10-CM | POA: Diagnosis not present

## 2014-11-24 DIAGNOSIS — E785 Hyperlipidemia, unspecified: Secondary | ICD-10-CM | POA: Diagnosis not present

## 2014-11-24 DIAGNOSIS — N529 Male erectile dysfunction, unspecified: Secondary | ICD-10-CM | POA: Diagnosis not present

## 2014-11-24 DIAGNOSIS — E559 Vitamin D deficiency, unspecified: Secondary | ICD-10-CM

## 2014-11-24 DIAGNOSIS — G500 Trigeminal neuralgia: Secondary | ICD-10-CM | POA: Diagnosis not present

## 2014-11-24 DIAGNOSIS — Z23 Encounter for immunization: Secondary | ICD-10-CM

## 2014-11-24 DIAGNOSIS — M546 Pain in thoracic spine: Secondary | ICD-10-CM

## 2014-11-24 DIAGNOSIS — R7989 Other specified abnormal findings of blood chemistry: Secondary | ICD-10-CM | POA: Diagnosis not present

## 2014-11-24 DIAGNOSIS — G894 Chronic pain syndrome: Secondary | ICD-10-CM

## 2014-11-24 DIAGNOSIS — Z1159 Encounter for screening for other viral diseases: Secondary | ICD-10-CM

## 2014-11-24 DIAGNOSIS — R531 Weakness: Secondary | ICD-10-CM | POA: Diagnosis not present

## 2014-11-24 MED ORDER — HYDROCODONE-ACETAMINOPHEN 7.5-325 MG PO TABS: 1.0000 | ORAL_TABLET | Freq: Four times a day (QID) | ORAL | Status: DC | PRN

## 2014-11-24 NOTE — Progress Notes (Signed)
Subjective:    Patient ID: Matthew [name], male    DOB: [birth year], 54 y.o.   MRN: [id number]  Pt presents to the office today for chronic follow up.  Hypertension This is a chronic problem. The current episode started more than 1 year ago. The problem has been resolved since onset. The problem is controlled. Associated symptoms include anxiety. Pertinent negatives include no headaches, palpitations or peripheral edema. Risk factors for coronary artery disease include dyslipidemia, family history, male gender and sedentary lifestyle. Past treatments include calcium channel blockers and diuretics. The current treatment provides moderate improvement. There is no history of kidney disease, CAD/MI, CVA, heart failure or a thyroid problem. There is no history of sleep apnea.  Hyperlipidemia This is a chronic problem. The current episode started more than 1 year ago. The problem is controlled. Recent lipid tests were reviewed and are normal. He has no history of diabetes. Factors aggravating his hyperlipidemia include smoking. Pertinent negatives include no leg pain. Current antihyperlipidemic treatment includes statins. Risk factors for coronary artery disease include dyslipidemia, hypertension, male sex, a sedentary lifestyle and family history.  Back Pain This is a new problem. The current episode started more than 1 year ago. The problem occurs constantly. The problem has been waxing and waning since onset. The pain is present in the thoracic spine. The quality of the pain is described as aching. The pain is at a severity of 7/10. The pain is moderate. The symptoms are aggravated by twisting and position. Pertinent negatives include no bladder incontinence, bowel incontinence, fever, headaches, leg pain, numbness or tingling. He has tried NSAIDs and analgesics for the symptoms. The treatment provided mild relief.  Anxiety Presents for follow-up visit. Onset was 1 to 5 years ago. The problem has been waxing  and waning. Symptoms include excessive worry and nervous/anxious behavior. Patient reports no decreased concentration, insomnia or palpitations. Symptoms occur occasionally. The severity of symptoms is mild.   His past medical history is significant for anxiety/panic attacks and depression. The treatment provided mild relief. Compliance with prior treatments has been good.  Arthritis Presents for follow-up visit. The disease course has been worsening. He complains of pain and joint warmth. He reports no joint swelling. Affected locations include the right hip, left hip and right knee. His pain is at a severity of 7/10. Associated symptoms include pain while resting. Pertinent negatives include no fever. His past medical history is significant for chronic back pain and osteoarthritis. Past treatments include activity, NSAIDs and rest. The treatment provided moderate relief.      Review of Systems  Constitutional: Negative.  Negative for fever.  HENT: Negative.   Respiratory: Negative.   Cardiovascular: Negative.  Negative for palpitations.  Gastrointestinal: Negative.  Negative for bowel incontinence.  Endocrine: Negative.   Genitourinary: Negative.  Negative for bladder incontinence.  Musculoskeletal: Positive for back pain and arthritis. Negative for joint swelling.  Neurological: Negative.  Negative for tingling, numbness and headaches.  Hematological: Negative.   Psychiatric/Behavioral: Negative for decreased concentration. The patient is nervous/anxious. The patient does not have insomnia.   All other systems reviewed and are negative.      Objective:   Physical Exam  Constitutional: He is oriented to person, place, and time. He appears well-developed and well-nourished. No distress.  HENT:  Head: Normocephalic.  Right Ear: External ear normal.  Left Ear: External ear normal.  Nose: Nose normal.  Mouth/Throat: Oropharynx is clear and moist.  Eyes: Pupils are equal, round, and  reactive to light. Right eye exhibits no discharge. Left eye exhibits no discharge.  Neck: Normal range of motion. Neck supple. No thyromegaly present.  Cardiovascular: Normal rate, regular rhythm, normal heart sounds and intact distal pulses.   No murmur heard. Pulmonary/Chest: Effort normal and breath sounds normal. No respiratory distress. He has no wheezes.  Abdominal: Soft. Bowel sounds are normal. He exhibits no distension. There is no tenderness.  Musculoskeletal: Normal range of motion. He exhibits no edema or tenderness.  Neurological: He is alert and oriented to person, place, and time. He has normal reflexes. No cranial nerve deficit.  Skin: Skin is warm and dry. No rash noted. No erythema.  Psychiatric: He has a normal mood and affect. His behavior is normal. Judgment and thought content normal.  Vitals reviewed.     BP 138/81 mmHg  Pulse 54  Temp(Src) 97.2 F (36.2 C) (Oral)  Ht 5' 9" (1.753 m)  Wt 246 lb 9.6 oz (111.857 kg)  BMI 36.40 kg/m2     Assessment & Plan:  1. Essential hypertension - CMP14+EGFR  2. Chronic bronchitis, unspecified chronic bronchitis type ([place]) - CMP14+EGFR  3. Trigeminal neuralgia - CMP14+EGFR  4. Erectile dysfunction, unspecified erectile dysfunction type - CMP14+EGFR  5. Adjustment disorder with mixed anxiety and depressed mood - CMP14+EGFR  6. Chronic pain syndrome Pain medication increased to 3 times a day as needed - CMP14+EGFR - HYDROcodone-acetaminophen (NORCO) 7.5-325 MG tablet; Take 1 tablet by mouth every 6 (six) hours as needed for moderate pain.  Dispense: 90 tablet; Refill: 0  7. Weakness - CMP14+EGFR  8. Vitamin D deficiency - CMP14+EGFR  9. Hyperlipidemia - CMP14+EGFR  10. Low serum calcium - CMP14+EGFR - PTH, Intact and Calcium  11. Midline thoracic back pain - HYDROcodone-acetaminophen (NORCO) 7.5-325 MG tablet; Take 1 tablet by mouth every 6 (six) hours as needed for moderate pain.  Dispense: 90 tablet;  Refill: 0  12. Arthritis - HYDROcodone-acetaminophen (NORCO) 7.5-325 MG tablet; Take 1 tablet by mouth every 6 (six) hours as needed for moderate pain.  Dispense: 90 tablet; Refill: 0  13. Need for hepatitis C screening test - Hepatitis C antibody   Continue all meds Labs pending Health Maintenance reviewed Diet and exercise encouraged RTO 3 months  Evelina Dun, FNP

## 2014-11-24 NOTE — Patient Instructions (Signed)

## 2014-11-25 LAB — CMP14+EGFR
A/G RATIO: 1.5 (ref 1.1–2.5)
ALK PHOS: 115 IU/L (ref 39–117)
ALT: 28 IU/L (ref 0–44)
AST: 19 IU/L (ref 0–40)
Albumin: 4 g/dL (ref 3.5–5.5)
BUN / CREAT RATIO: 8 — AB (ref 9–20)
BUN: 6 mg/dL (ref 6–24)
CO2: 24 mmol/L (ref 18–29)
Calcium: 8.9 mg/dL (ref 8.7–10.2)
Chloride: 96 mmol/L — ABNORMAL LOW (ref 97–108)
Creatinine, Ser: 0.73 mg/dL — ABNORMAL LOW (ref 0.76–1.27)
GFR calc Af Amer: 121 mL/min/{1.73_m2} (ref >59)
GFR calc non Af Amer: 104 mL/min/{1.73_m2} (ref >59)
GLOBULIN, TOTAL: 2.7 g/dL (ref 1.5–4.5)
Glucose: 122 mg/dL — ABNORMAL HIGH (ref 65–99)
POTASSIUM: 4.3 mmol/L (ref 3.5–5.2)
SODIUM: 137 mmol/L (ref 134–144)
Total Protein: 6.7 g/dL (ref 6.0–8.5)

## 2014-11-25 LAB — PTH, INTACT AND CALCIUM: PTH: 24 pg/mL (ref 15–65)

## 2014-11-25 LAB — HEPATITIS C ANTIBODY: Hep C Virus Ab: <0.1 s/co ratio (ref 0.0–0.9)

## 2014-11-26 ENCOUNTER — Telehealth: Payer: Self-pay | Admitting: Family

## 2014-11-26 NOTE — Telephone Encounter (Signed)
No VM set up.

## 2014-11-26 NOTE — Telephone Encounter (Signed)
Pt was wanting to know if he needed to continue taking calcium supplement. Both lab values that Alyse Low drew for this were within normal limits. Since he was instructed to continue these before labs came back normal, instructed to hold off and see what his levels are at his next visit.

## 2014-11-26 NOTE — Telephone Encounter (Signed)
Stp and advised his calcium labs were wnl this time but Alyse Low still had to review them and we would call with any recommendations or suggestions.

## 2014-11-30 ENCOUNTER — Encounter: Payer: Self-pay | Admitting: Family

## 2014-11-30 ENCOUNTER — Ambulatory Visit (INDEPENDENT_AMBULATORY_CARE_PROVIDER_SITE_OTHER): Payer: Medicare Other | Admitting: Family

## 2014-11-30 VITALS — BP 131/80 | HR 56 | Temp 97.2°F | Ht 69.0 in | Wt 246.0 lb

## 2014-11-30 DIAGNOSIS — M1990 Unspecified osteoarthritis, unspecified site: Secondary | ICD-10-CM

## 2014-11-30 DIAGNOSIS — M7022 Olecranon bursitis, left elbow: Secondary | ICD-10-CM | POA: Diagnosis not present

## 2014-11-30 MED ORDER — METHYLPREDNISOLONE ACETATE 40 MG/ML IJ SUSP
40.0000 mg | Freq: Once | INTRAMUSCULAR | Status: AC
  Administered 2014-11-30: 40 mg via INTRA_ARTICULAR

## 2014-11-30 MED ORDER — BUPIVACAINE HCL 0.25 % IJ SOLN
1.0000 mL | Freq: Once | INTRAMUSCULAR | Status: AC
  Administered 2014-11-30: 1 mL via INTRA_ARTICULAR

## 2014-11-30 MED ORDER — HYDROCODONE-ACETAMINOPHEN 7.5-325 MG PO TABS: 1.0000 | ORAL_TABLET | Freq: Four times a day (QID) | ORAL | Status: DC | PRN

## 2014-11-30 NOTE — Progress Notes (Signed)
   Subjective:    Patient ID: Matthew [name], male    DOB: 1960/11/27, 54 y.o.   MRN: [id number]  HPI Pt presents to the office today for fluid on left elbow. Pt states he noticed it about a week ago. PT denies any trauma to the area. Pt states that at times he has a "warm feeling", but denies any pain, fever, or redness.    Review of Systems  Constitutional: Negative.   HENT: Negative.   Respiratory: Negative.   Cardiovascular: Negative.   Gastrointestinal: Negative.   Endocrine: Negative.   Genitourinary: Negative.   Musculoskeletal: Negative.   Neurological: Negative.   Hematological: Negative.   Psychiatric/Behavioral: Negative.   All other systems reviewed and are negative.      Objective:   Physical Exam  Constitutional: He is oriented to person, place, and time. He appears well-developed and well-nourished. No distress.  HENT:  Head: Normocephalic.  Eyes: Pupils are equal, round, and reactive to light. Right eye exhibits no discharge. Left eye exhibits no discharge.  Neck: Normal range of motion. Neck supple. No thyromegaly present.  Cardiovascular: Normal rate, regular rhythm, normal heart sounds and intact distal pulses.   No murmur heard. Pulmonary/Chest: Effort normal and breath sounds normal. No respiratory distress. He has no wheezes.  Abdominal: Soft. Bowel sounds are normal. He exhibits no distension. There is no tenderness.  Musculoskeletal: Normal range of motion. He exhibits edema. He exhibits no tenderness.  Neurological: He is alert and oriented to person, place, and time. He has normal reflexes. No cranial nerve deficit.  Skin: Skin is warm and dry. No rash noted. No erythema.  Psychiatric: He has a normal mood and affect. His behavior is normal. Judgment and thought content normal.  Vitals reviewed.   BP 131/80 mmHg  Pulse 56  Temp(Src) 97.2 F (36.2 C) (Oral)  Ht 5\' 9"  (1.753 m)  Wt 246 lb (111.585 kg)  BMI 36.31 kg/m2  Procedure Note: Area  cleaned with Chlorhexidine 9 ml of serous-sanguinous fluid aspirated from left elbow Marcaine and Depo-Medrol injected into elbow Area cleaned and band-aid applied Pt tolerated well with no concerns or complications      Assessment & Plan:  1. Bursitis of elbow, left -Rest -Ice as needed -S/S of infection discussed -RTO prn - bupivacaine (MARCAINE) 0.25 % (with pres) injection 1 mL; Inject 1 mL into the articular space once. - methylPREDNISolone acetate (DEPO-MEDROL) injection 40 mg; Inject 1 mL (40 mg total) into the articular space once.   *Pt medication refilled today and wrote that RX could not be filled until 12/25/14  Evelina Dun, FNP

## 2014-11-30 NOTE — Patient Instructions (Signed)
Elbow Bursitis  Elbow bursitis is inflammation of the fluid-filled sac (bursa) between the tip of your elbow bone (olecranon) and your skin. Elbow bursitis may also be called olecranon bursitis.  Normally, the olecranon bursa has only a small amount of fluid in it to cushion and protect your elbow bone. Elbow bursitis causes fluid to build up inside the bursa. Over time, this swelling and inflammation can cause pain when you bend or lean on your elbow.   CAUSES  Elbow bursitis may be caused by:    Elbow injury (acute trauma).   Leaning on hard surfaces for long periods of time.   Infection from an injury that breaks the skin near your elbow.   A bone growth (spur) that forms at the tip of your elbow.   A medical condition that causes inflammation in your body, such as gout or rheumatoid arthritis.   The cause may also be unknown.   SIGNS AND SYMPTOMS   The first sign of elbow bursitis is usually swelling over the tip of your elbow. This can grow to be the size of a golf ball. This may start suddenly or develop gradually. You may also have:   Pain when bending or leaning on your elbow.   Restricted movement of your elbow.   If your bursitis is caused by an infection, symptoms may also include:   Redness, warmth, and tenderness of the elbow.   Drainage of pus from the swollen area over your elbow, if the skin breaks open.  DIAGNOSIS   Your health care provider may be able to diagnose elbow bursitis based on your signs and symptoms, especially if you have recently been injured. Your health care provider will also do a physical exam. This may include:   X-rays to look for a bone spur or a bone fracture.   Draining fluid from the bursa to test it for infection.   Blood tests to rule out gout or rheumatoid arthritis.  TREATMENT   Treatment for elbow bursitis depends on the cause. Treatment may include:   Medicines. These may include:    Over-the-counter medicines to relieve pain and inflammation.     Antibiotic medicines to fight infection.    Injections of anti-inflammatory medicines (steroids).   Wrapping your elbow with a bandage.   Draining fluid from the bursa.   Wearing elbow pads.   If your bursitis does not get better with treatment, surgery may be needed to remove the bursa.   HOME CARE INSTRUCTIONS    Take medicines only as directed by your health care provider.   If you were prescribed an antibiotic medicine, finish all of it even if you start to feel better.   If your bursitis is caused by an injury, rest your elbow and wear your bandage as directed by your health care provider. You may alsoapply ice to the injured area as directed by your health care provider:   Put ice in a plastic bag.   Place a towel between your skin and the bag.   Leave the ice on for 20 minutes, 2-3 times per day.   Avoid any activities that cause elbow pain.   Use elbow pads or elbow wraps to cushion your elbow.  SEEK MEDICAL CARE IF:   You have a fever.    Your symptoms do not get better with treatment.   Your pain or swelling gets worse.   Your elbow pain or swelling goes away and then returns.   You have   drainage of pus from the swollen area over your elbow.     This information is not intended to replace advice given to you by your health care provider. Make sure you discuss any questions you have with your health care provider.     Document Released: 03/01/2006 Document Revised: 02/20/2014 Document Reviewed: 10/08/2013  [employer] Interactive Patient Education 2016 [employer]

## 2014-12-31 ENCOUNTER — Ambulatory Visit (INDEPENDENT_AMBULATORY_CARE_PROVIDER_SITE_OTHER): Payer: Medicare Other | Admitting: Family

## 2014-12-31 ENCOUNTER — Encounter: Payer: Self-pay | Admitting: Family

## 2014-12-31 VITALS — BP 127/76 | HR 55 | Temp 97.4°F | Ht 69.0 in | Wt 256.0 lb

## 2014-12-31 DIAGNOSIS — K5900 Constipation, unspecified: Secondary | ICD-10-CM

## 2014-12-31 DIAGNOSIS — M7022 Olecranon bursitis, left elbow: Secondary | ICD-10-CM

## 2014-12-31 MED ORDER — METHYLPREDNISOLONE ACETATE 40 MG/ML IJ SUSP
40.0000 mg | Freq: Once | INTRAMUSCULAR | Status: AC
  Administered 2014-12-31: 40 mg via INTRA_ARTICULAR

## 2014-12-31 MED ORDER — LUBIPROSTONE 24 MCG PO CAPS: 24.0000 ug | ORAL_CAPSULE | Freq: Two times a day (BID) | ORAL | Status: DC

## 2014-12-31 MED ORDER — BUPIVACAINE HCL 0.25 % IJ SOLN
1.0000 mL | Freq: Once | INTRAMUSCULAR | Status: AC
  Administered 2014-12-31: 1 mL via INTRA_ARTICULAR

## 2014-12-31 NOTE — Patient Instructions (Addendum)
Elbow Bursitis Elbow bursitis is inflammation of the fluid-filled sac (bursa) between the tip of your elbow bone (olecranon) and your skin. Elbow bursitis may also be called olecranon bursitis. Normally, the olecranon bursa has only a small amount of fluid in it to cushion and protect your elbow bone. Elbow bursitis causes fluid to build up inside the bursa. Over time, this swelling and inflammation can cause pain when you bend or lean on your elbow.  CAUSES Elbow bursitis may be caused by:   Elbow injury (acute trauma).  Leaning on hard surfaces for long periods of time.  Infection from an injury that breaks the skin near your elbow.  A bone growth (spur) that forms at the tip of your elbow.  A medical condition that causes inflammation in your body, such as gout or rheumatoid arthritis.  The cause may also be unknown.  SIGNS AND SYMPTOMS  The first sign of elbow bursitis is usually swelling over the tip of your elbow. This can grow to be the size of a golf ball. This may start suddenly or develop gradually. You may also have:  Pain when bending or leaning on your elbow.  Restricted movement of your elbow.  If your bursitis is caused by an infection, symptoms may also include:  Redness, warmth, and tenderness of the elbow.  Drainage of pus from the swollen area over your elbow, if the skin breaks open. DIAGNOSIS  Your health care provider may be able to diagnose elbow bursitis based on your signs and symptoms, especially if you have recently been injured. Your health care provider will also do a physical exam. This may include:  X-rays to look for a bone spur or a bone fracture.  Draining fluid from the bursa to test it for infection.  Blood tests to rule out gout or rheumatoid arthritis. TREATMENT  Treatment for elbow bursitis depends on the cause. Treatment may include:  Medicines. These may include:  Over-the-counter medicines to relieve pain and  inflammation.  Antibiotic medicines to fight infection.  Injections of anti-inflammatory medicines (steroids).  Wrapping your elbow with a bandage.  Draining fluid from the bursa.  Wearing elbow pads.  If your bursitis does not get better with treatment, surgery may be needed to remove the bursa.  HOME CARE INSTRUCTIONS   Take medicines only as directed by your health care provider.  If you were prescribed an antibiotic medicine, finish all of it even if you start to feel better.  If your bursitis is caused by an injury, rest your elbow and wear your bandage as directed by your health care provider. You may alsoapply ice to the injured area as directed by your health care provider:  Put ice in a plastic bag.  Place a towel between your skin and the bag.  Leave the ice on for 20 minutes, 2-3 times per day.  Avoid any activities that cause elbow pain.  Use elbow pads or elbow wraps to cushion your elbow. SEEK MEDICAL CARE IF:  You have a fever.   Your symptoms do not get better with treatment.  Your pain or swelling gets worse.  Your elbow pain or swelling goes away and then returns.  You have drainage of pus from the swollen area over your elbow.   This information is not intended to replace advice given to you by your health care provider. Make sure you discuss any questions you have with your health care provider.   Document Released: 03/01/2006 Document Revised: 02/20/2014 Document  Reviewed: 10/08/2013 Elsevier Interactive Patient Education 2016 [employer]. Constipation, Adult Constipation is when a person has fewer than three bowel movements a week, has difficulty having a bowel movement, or has stools that are dry, hard, or larger than normal. As people grow older, constipation is more common. A low-fiber diet, not taking in enough fluids, and taking certain medicines may make constipation worse.  CAUSES   Certain medicines, such as antidepressants, pain  medicine, iron supplements, antacids, and water pills.   Certain diseases, such as diabetes, irritable bowel syndrome (IBS), thyroid disease, or depression.   Not drinking enough water.   Not eating enough fiber-rich foods.   Stress or travel.   Lack of physical activity or exercise.   Ignoring the urge to have a bowel movement.   Using laxatives too much.  SIGNS AND SYMPTOMS   Having fewer than three bowel movements a week.   Straining to have a bowel movement.   Having stools that are hard, dry, or larger than normal.   Feeling full or bloated.   Pain in the lower abdomen.   Not feeling relief after having a bowel movement.  DIAGNOSIS  Your health care provider will take a medical history and perform a physical exam. Further testing may be done for severe constipation. Some tests may include:  A barium enema X-ray to examine your rectum, colon, and, sometimes, your small intestine.   A sigmoidoscopy to examine your lower colon.   A colonoscopy to examine your entire colon. TREATMENT  Treatment will depend on the severity of your constipation and what is causing it. Some dietary treatments include drinking more fluids and eating more fiber-rich foods. Lifestyle treatments may include regular exercise. If these diet and lifestyle recommendations do not help, your health care provider may recommend taking over-the-counter laxative medicines to help you have bowel movements. Prescription medicines may be prescribed if over-the-counter medicines do not work.  HOME CARE INSTRUCTIONS   Eat foods that have a lot of fiber, such as fruits, vegetables, whole grains, and beans.  Limit foods high in fat and processed sugars, such as french fries, hamburgers, cookies, candies, and soda.   A fiber supplement may be added to your diet if you cannot get enough fiber from foods.   Drink enough fluids to keep your urine clear or pale yellow.   Exercise regularly or  as directed by your health care provider.   Go to the restroom when you have the urge to go. Do not hold it.   Only take over-the-counter or prescription medicines as directed by your health care provider. Do not take other medicines for constipation without talking to your health care provider first.  [place] IF:   You have bright red blood in your stool.   Your constipation lasts for more than 4 days or gets worse.   You have abdominal or rectal pain.   You have thin, pencil-like stools.   You have unexplained weight loss. MAKE SURE YOU:   Understand these instructions.  Will watch your condition.  Will get help right away if you are not doing well or get worse.   This information is not intended to replace advice given to you by your health care provider. Make sure you discuss any questions you have with your health care provider.   Document Released: 10/29/2003 Document Revised: 02/20/2014 Document Reviewed: 11/11/2012 Elsevier Interactive Patient Education [employer].

## 2014-12-31 NOTE — Progress Notes (Signed)
   Subjective:    Patient ID: Matthew [name], male    DOB: 12/08/1960, 54 y.o.   MRN: [id number]   HPI Pt presents to the office today for recurrent fluid on left elbow. PT was seen in the office on 11/30/15 and had the fluid removed and pt states he noticed the fluid started "returning about a week ago". Pt denies any trauma to the area or pain.  PT is also complaining of chronic constipation. Pt states he is having to take a laxative daily with mild relief. Pt states he would like to take medication that would "keep him regular".     Review of Systems  Constitutional: Negative.   HENT: Negative.   Respiratory: Negative.   Cardiovascular: Negative.   Gastrointestinal: Negative.   Endocrine: Negative.   Genitourinary: Negative.   Musculoskeletal: Negative.   Neurological: Negative.   Hematological: Negative.   Psychiatric/Behavioral: Negative.   All other systems reviewed and are negative.      Objective:   Physical Exam  Constitutional: He is oriented to person, place, and time. He appears well-developed and well-nourished. No distress.  HENT:  Head: Normocephalic.  Eyes: Pupils are equal, round, and reactive to light. Right eye exhibits no discharge. Left eye exhibits no discharge.  Neck: Normal range of motion. Neck supple. No thyromegaly present.  Cardiovascular: Normal rate, regular rhythm, normal heart sounds and intact distal pulses.   No murmur heard. Pulmonary/Chest: Effort normal and breath sounds normal. No respiratory distress. He has no wheezes.  Abdominal: Soft. Bowel sounds are normal. He exhibits no distension. There is no tenderness.  Musculoskeletal: Normal range of motion. He exhibits no edema or tenderness.  Neurological: He is alert and oriented to person, place, and time. He has normal reflexes. No cranial nerve deficit.  Skin: Skin is warm and dry. No rash noted. No erythema.  Psychiatric: He has a normal mood and affect. His behavior is normal. Judgment and  thought content normal.  Vitals reviewed.  Procedure Note: Area cleaned with Chlorhexidine 20 ml of sanguinous fluid aspirated from left elbow Marcaine and Depo-Medrol injected into elbow Area cleaned and band-aid applied Pt tolerated well with no concerns or complications    BP [id number] mmHg  Pulse 55  Temp(Src) 97.4 F (36.3 C) (Oral)  Ht 5\' 9"  (1.753 m)  Wt 256 lb (116.121 kg)  BMI 37.79 kg/m2     Assessment & Plan:  1. Bursitis of elbow, left -S/S of infection discussed -Report any redness warmth or new swelling RTO prn - bupivacaine (MARCAINE) 0.25 % (with pres) injection 1 mL; Inject 1 mL into the articular space once. - methylPREDNISolone acetate (DEPO-MEDROL) injection 40 mg; Inject 1 mL (40 mg total) into the articular space once.  2. Constipation, unspecified constipation type -Force fluids -High fiber diet discussed -RTO prn and keep chronic follow up - lubiprostone (AMITIZA) 24 MCG capsule; Take 1 capsule (24 mcg total) by mouth 2 (two) times daily with a meal.  Dispense: 90 capsule; Refill: [place], FNP

## 2015-01-20 ENCOUNTER — Telehealth: Payer: Self-pay | Admitting: Family

## 2015-01-20 DIAGNOSIS — M1990 Unspecified osteoarthritis, unspecified site: Secondary | ICD-10-CM

## 2015-01-21 MED ORDER — HYDROCODONE-ACETAMINOPHEN 7.5-325 MG PO TABS: 1.0000 | ORAL_TABLET | Freq: Four times a day (QID) | ORAL | Status: DC | PRN

## 2015-01-21 NOTE — Telephone Encounter (Signed)
[facility] patient. Last filled 12/25/14, last seen 12/31/14.

## 2015-01-21 NOTE — Telephone Encounter (Signed)
Pt aware rx is ready for pickup.  

## 2015-01-21 NOTE — Telephone Encounter (Signed)
Go ahead and fill for 1 month, will need to follow up with [facility] for all further refills

## 2015-01-27 ENCOUNTER — Telehealth: Payer: Self-pay | Admitting: Family

## 2015-01-27 NOTE — Telephone Encounter (Signed)
Matthew [name] and when was he diagnosed with this, he needs to be seen if he has an infection. Caryl Pina, MD [facility] 01/27/2015, 2:38 PM

## 2015-01-28 NOTE — Telephone Encounter (Signed)
Pt aware needs appt. 

## 2015-02-02 ENCOUNTER — Encounter: Payer: Self-pay | Admitting: Family

## 2015-02-02 ENCOUNTER — Ambulatory Visit (INDEPENDENT_AMBULATORY_CARE_PROVIDER_SITE_OTHER): Payer: Medicare Other | Admitting: Family

## 2015-02-02 ENCOUNTER — Telehealth: Payer: Self-pay | Admitting: Family

## 2015-02-02 VITALS — BP 139/91 | HR 73 | Temp 97.0°F | Ht 69.0 in | Wt 266.0 lb

## 2015-02-02 DIAGNOSIS — Z720 Tobacco use: Secondary | ICD-10-CM

## 2015-02-02 DIAGNOSIS — G500 Trigeminal neuralgia: Secondary | ICD-10-CM

## 2015-02-02 DIAGNOSIS — J42 Unspecified chronic bronchitis: Secondary | ICD-10-CM

## 2015-02-02 DIAGNOSIS — J209 Acute bronchitis, unspecified: Secondary | ICD-10-CM

## 2015-02-02 DIAGNOSIS — M1990 Unspecified osteoarthritis, unspecified site: Secondary | ICD-10-CM

## 2015-02-02 DIAGNOSIS — F17200 Nicotine dependence, unspecified, uncomplicated: Secondary | ICD-10-CM

## 2015-02-02 MED ORDER — FLUTICASONE FUROATE-VILANTEROL 100-25 MCG/INH IN AEPB: 1.0000 | INHALATION_SPRAY | Freq: Every day | RESPIRATORY_TRACT | Status: DC

## 2015-02-02 MED ORDER — BENZONATATE 200 MG PO CAPS: 200.0000 mg | ORAL_CAPSULE | Freq: Three times a day (TID) | ORAL | Status: DC | PRN

## 2015-02-02 MED ORDER — HYDROCODONE-ACETAMINOPHEN 7.5-325 MG PO TABS: 1.0000 | ORAL_TABLET | Freq: Four times a day (QID) | ORAL | Status: DC | PRN

## 2015-02-02 MED ORDER — KETOROLAC TROMETHAMINE 60 MG/2ML IM SOLN
60.0000 mg | Freq: Once | INTRAMUSCULAR | Status: AC
Start: 2015-02-02 — End: 2015-02-02
  Administered 2015-02-02: 60 mg via INTRAMUSCULAR

## 2015-02-02 MED ORDER — AZITHROMYCIN 250 MG PO TABS
ORAL_TABLET | ORAL | Status: DC
Start: 2015-02-02 — End: 2015-02-11

## 2015-02-02 NOTE — Telephone Encounter (Signed)
Pt notified of recommendation Verbalizes understanding 

## 2015-02-02 NOTE — Addendum Note (Signed)
Addended by: Evelina Dun A on: 02/02/2015 12:40 PM   Modules accepted: Orders

## 2015-02-02 NOTE — Patient Instructions (Signed)
Chronic Obstructive Pulmonary Disease Chronic obstructive pulmonary disease (COPD) is a common lung condition in which airflow from the lungs is limited. COPD is a general term that can be used to describe many different lung problems that limit airflow, including both chronic bronchitis and emphysema. If you have COPD, your lung function will probably never return to normal, but there are measures you can take to improve lung function and make yourself feel better. CAUSES   Smoking (common).  Exposure to secondhand smoke.  Genetic problems.  Chronic inflammatory lung diseases or recurrent infections. SYMPTOMS  Shortness of breath, especially with physical activity.  Deep, persistent (chronic) cough with a large amount of thick mucus.  Wheezing.  Rapid breaths (tachypnea).  Gray or bluish discoloration (cyanosis) of the skin, especially in your fingers, toes, or lips.  Fatigue.  Weight loss.  Frequent infections or episodes when breathing symptoms become much worse (exacerbations).  Chest tightness. DIAGNOSIS Your health care provider will take a medical history and perform a physical examination to diagnose COPD. Additional tests for COPD may include:  Lung (pulmonary) function tests.  Chest X-ray.  CT scan.  Blood tests. TREATMENT  Treatment for COPD may include:  Inhaler and nebulizer medicines. These help manage the symptoms of COPD and make your breathing more comfortable.  Supplemental oxygen. Supplemental oxygen is only helpful if you have a low oxygen level in your blood.  Exercise and physical activity. These are beneficial for nearly all people with COPD.  Lung surgery or transplant.  Nutrition therapy to gain weight, if you are underweight.  Pulmonary rehabilitation. This may involve working with a team of health care providers and specialists, such as respiratory, occupational, and physical therapists. HOME CARE INSTRUCTIONS  Take all medicines  (inhaled or pills) as directed by your health care provider.  Avoid over-the-counter medicines or cough syrups that dry up your airway (such as antihistamines) and slow down the elimination of secretions unless instructed otherwise by your health care provider.  If you are a smoker, the most important thing that you can do is stop smoking. Continuing to smoke will cause further lung damage and breathing trouble. Ask your health care provider for help with quitting smoking. He or she can direct you to community resources or hospitals that provide support.  Avoid exposure to irritants such as smoke, chemicals, and fumes that aggravate your breathing.  Use oxygen therapy and pulmonary rehabilitation if directed by your health care provider. If you require home oxygen therapy, ask your health care provider whether you should purchase a pulse oximeter to measure your oxygen level at home.  Avoid contact with individuals who have a contagious illness.  Avoid extreme temperature and humidity changes.  Eat healthy foods. Eating smaller, more frequent meals and resting before meals may help you maintain your strength.  Stay active, but balance activity with periods of rest. Exercise and physical activity will help you maintain your ability to do things you want to do.  Preventing infection and hospitalization is very important when you have COPD. Make sure to receive all the vaccines your health care provider recommends, especially the pneumococcal and influenza vaccines. Ask your health care provider whether you need a pneumonia vaccine.  Learn and use relaxation techniques to manage stress.  Learn and use controlled breathing techniques as directed by your health care provider. Controlled breathing techniques include:  Pursed lip breathing. Start by breathing in (inhaling) through your nose for 1 second. Then, purse your lips as if you were   going to whistle and breathe out (exhale) through the  pursed lips for 2 seconds.  Diaphragmatic breathing. Start by putting one hand on your abdomen just above your waist. Inhale slowly through your nose. The hand on your abdomen should move out. Then purse your lips and exhale slowly. You should be able to feel the hand on your abdomen moving in as you exhale.  Learn and use controlled coughing to clear mucus from your lungs. Controlled coughing is a series of short, progressive coughs. The steps of controlled coughing are: 1. Lean your head slightly forward. 2. Breathe in deeply using diaphragmatic breathing. 3. Try to hold your breath for 3 seconds. 4. Keep your mouth slightly open while coughing twice. 5. Spit any mucus out into a tissue. 6. Rest and repeat the steps once or twice as needed. SEEK MEDICAL CARE IF:  You are coughing up more mucus than usual.  There is a change in the color or thickness of your mucus.  Your breathing is more labored than usual.  Your breathing is faster than usual. SEEK IMMEDIATE MEDICAL CARE IF:  You have shortness of breath while you are resting.  You have shortness of breath that prevents you from:  Being able to talk.  Performing your usual physical activities.  You have chest pain lasting longer than 5 minutes.  Your skin color is more cyanotic than usual.  You measure low oxygen saturations for longer than 5 minutes with a pulse oximeter. MAKE SURE YOU:  Understand these instructions.  Will watch your condition.  Will get help right away if you are not doing well or get worse.   This information is not intended to replace advice given to you by your health care provider. Make sure you discuss any questions you have with your health care provider.   Document Released: 11/09/2004 Document Revised: 02/20/2014 Document Reviewed: 09/26/2012 Elsevier Interactive Patient Education 2016 Elsevier Inc[employer]  

## 2015-02-02 NOTE — Progress Notes (Signed)
   Subjective:    Patient ID: Matthew [name], male    DOB: [birth year], 54 y.o.   MRN: [id number]  HPI Pt presents to the office today with right jaw pain. Pt states he has trigeminal neuralgia and states he needs "meds to help with inflammation every now and then". Pt states he is coughing up intermittent yellow sputum. Pt states he has not taken anything and denies any relief.    Review of Systems  Constitutional: Negative.   HENT: Negative.   Respiratory: Negative.   Cardiovascular: Negative.   Gastrointestinal: Negative.   Endocrine: Negative.   Genitourinary: Negative.   Musculoskeletal: Negative.   Neurological: Negative.   Hematological: Negative.   Psychiatric/Behavioral: Negative.   All other systems reviewed and are negative.      Objective:   Physical Exam  Constitutional: He is oriented to person, place, and time. He appears well-developed and well-nourished. No distress.  HENT:  Head: Normocephalic.  Right Ear: External ear normal.  Left Ear: External ear normal.  Oropharynx erythemas Nasal passage erythemas with mild swelling    Eyes: Pupils are equal, round, and reactive to light. Right eye exhibits no discharge. Left eye exhibits no discharge.  Neck: Normal range of motion. Neck supple. No thyromegaly present.  Cardiovascular: Normal rate, regular rhythm, normal heart sounds and intact distal pulses.   No murmur heard. Pulmonary/Chest: Effort normal. No respiratory distress. He has wheezes.  Coarse non-productive cough   Abdominal: Soft. Bowel sounds are normal. He exhibits no distension. There is no tenderness.  Musculoskeletal: Normal range of motion. He exhibits no edema or tenderness.  Neurological: He is alert and oriented to person, place, and time. He has normal reflexes. No cranial nerve deficit.  Skin: Skin is warm and dry. No rash noted. No erythema.  Psychiatric: He has a normal mood and affect. His behavior is normal. Judgment and thought content  normal.  Vitals reviewed.   BP 139/91 mmHg  Pulse 73  Temp(Src) 97 F (36.1 C) (Oral)  Ht 5\' 9"  (1.753 m)  Wt 266 lb (120.657 kg)  BMI 39.26 kg/m2       Assessment & Plan:  1. Trigeminal neuralgia -Continue with Lyric andTegretol  2. Chronic bronchitis, unspecified chronic bronchitis type ([place]) -Smoking cessation discussed -Pt to stop QVar and Breo Prescription sent to pharmacy  -RTO 2 weeks - Fluticasone Furoate-Vilanterol (BREO ELLIPTA) 100-25 MCG/INH AEPB; Inhale 1 puff into the lungs daily.  Dispense: 28 each; Refill: 3  3. Smoker -Smoking cessation discussed  4. Acute bronchitis, unspecified organism -- Take meds as prescribed - Use a cool mist humidifier  -Use saline nose sprays frequently -Saline irrigations of the nose can be very helpful if done frequently.  * 4X daily for 1 week*  * Use of a nettie pot can be helpful with this. Follow directions with this* -Force fluids -For any cough or congestion  Use plain Mucinex- regular strength or max strength is fine   * Children- consult with Pharmacist for dosing -For fever or aces or pains- take tylenol or ibuprofen appropriate for age and weight.  * for fevers greater than 101 orally you may alternate ibuprofen and tylenol every  3 hours. -Throat lozenges if help - benzonatate (TESSALON) 200 MG capsule; Take 1 capsule (200 mg total) by mouth 3 (three) times daily as needed.  Dispense: 30 capsule; Refill: [place], FNP

## 2015-02-11 ENCOUNTER — Encounter: Payer: Self-pay | Admitting: Family

## 2015-02-11 ENCOUNTER — Ambulatory Visit (INDEPENDENT_AMBULATORY_CARE_PROVIDER_SITE_OTHER): Payer: Medicare Other | Admitting: Family

## 2015-02-11 ENCOUNTER — Ambulatory Visit: Payer: Medicare Other | Admitting: Family

## 2015-02-11 ENCOUNTER — Telehealth: Payer: Self-pay | Admitting: Family

## 2015-02-11 VITALS — BP 141/87 | HR 54 | Temp 96.7°F | Ht 69.0 in | Wt 272.0 lb

## 2015-02-11 DIAGNOSIS — F17200 Nicotine dependence, unspecified, uncomplicated: Secondary | ICD-10-CM

## 2015-02-11 DIAGNOSIS — G500 Trigeminal neuralgia: Secondary | ICD-10-CM

## 2015-02-11 DIAGNOSIS — Z720 Tobacco use: Secondary | ICD-10-CM | POA: Diagnosis not present

## 2015-02-11 DIAGNOSIS — J42 Unspecified chronic bronchitis: Secondary | ICD-10-CM | POA: Diagnosis not present

## 2015-02-11 MED ORDER — KETOROLAC TROMETHAMINE 60 MG/2ML IM SOLN
60.0000 mg | Freq: Once | INTRAMUSCULAR | Status: AC
  Administered 2015-02-11: 60 mg via INTRAMUSCULAR

## 2015-02-11 MED ORDER — PREDNISONE 10 MG (21) PO TBPK: 10.0000 mg | ORAL_TABLET | Freq: Every day | ORAL | Status: DC

## 2015-02-11 MED ORDER — METHYLPREDNISOLONE ACETATE 80 MG/ML IJ SUSP
80.0000 mg | Freq: Once | INTRAMUSCULAR | Status: AC
  Administered 2015-02-11: 80 mg via INTRAMUSCULAR

## 2015-02-11 NOTE — Telephone Encounter (Signed)
Spoke with patients wife and given directions on how he should take the prednisone.

## 2015-02-11 NOTE — Progress Notes (Signed)
   Subjective:    Patient ID: Matthew [name], male    DOB: 1960-10-18, 54 y.o.   MRN: [id number]  HPI Pt presents to the office today with right facial pain. Pt states he has a history of trigeminal neuralgia and states "the nerves get inflamed at times". Pt takes Lyric 100 mg TID and Tegretol 400 mg four times a day. Pt states he is having a intermittent sharp pain in his right cheek into his jaw. Pt states he has seen a neurologists in the past, but not recently. PT states he can not afford the gas money to go out of town to see specialists. PT is also complaining of "ringing of his ears". Pt states this has been going on for years, and is unchanged. Pt admits he has been around drums and loud noise with no ear protection.    Review of Systems  Constitutional: Negative.   HENT: Negative.   Respiratory: Negative.   Cardiovascular: Negative.   Gastrointestinal: Negative.   Endocrine: Negative.   Genitourinary: Negative.   Musculoskeletal: Negative.   Neurological: Negative.   Hematological: Negative.   Psychiatric/Behavioral: Negative.   All other systems reviewed and are negative.      Objective:   Physical Exam  Constitutional: He is oriented to person, place, and time. He appears well-developed and well-nourished. No distress.  HENT:  Head: Normocephalic.  Right Ear: External ear normal.  Left Ear: External ear normal.  Nose: Nose normal.  Mouth/Throat: Oropharynx is clear and moist.  Eyes: Pupils are equal, round, and reactive to light. Right eye exhibits no discharge. Left eye exhibits no discharge.  Neck: Normal range of motion. Neck supple. No thyromegaly present.  Cardiovascular: Normal rate, regular rhythm, normal heart sounds and intact distal pulses.   No murmur heard. Pulmonary/Chest: Effort normal and breath sounds normal. No respiratory distress. He has no wheezes.  Abdominal: Soft. Bowel sounds are normal. He exhibits no distension. There is no tenderness.    Musculoskeletal: Normal range of motion. He exhibits no edema or tenderness.  Neurological: He is alert and oriented to person, place, and time. He has normal reflexes. No cranial nerve deficit.  Skin: Skin is warm and dry. No rash noted. No erythema.  Psychiatric: He has a normal mood and affect. His behavior is normal. Judgment and thought content normal.  Vitals reviewed.   BP 141/87 mmHg  Pulse 54  Temp(Src) 96.7 F (35.9 C) (Oral)  Ht 5\' 9"  (1.753 m)  Wt 272 lb (123.378 kg)  BMI 40.15 kg/m2       Assessment & Plan:  1. Chronic bronchitis, unspecified chronic bronchitis type ([place]) Smoking cessation discussed Continue Breo  2. Smoker -Smoking cessation discussed  3. Trigeminal neuralgia -Referral to neurologists sent -Take pain medications as ordered- Pt requesting refill of pain medication today, but told pt it is too early - methylPREDNISolone acetate (DEPO-MEDROL) injection 80 mg; Inject 1 mL (80 mg total) into the muscle once. - ketorolac (TORADOL) injection 60 mg; Inject 2 mLs (60 mg total) into the muscle once. - Ambulatory referral to Neurology   Evelina Dun, FNP

## 2015-02-11 NOTE — Patient Instructions (Signed)
Trigeminal Neuralgia °Trigeminal neuralgia is a nerve disorder that causes attacks of severe facial pain. The attacks last from a few seconds to several minutes. They can happen for days, weeks, or months and then go away for months or years. Trigeminal neuralgia is also called tic douloureux. °CAUSES °This condition is caused by damage to a nerve in the face that is called the trigeminal nerve. An attack can be triggered by: °· Talking. °· Chewing. °· Putting on makeup. °· Washing your face. °· Shaving your face. °· Brushing your teeth. °· Touching your face. °RISK FACTORS °This condition is more likely to develop in: °· Women. °· People who are 50 years of age or older. °SYMPTOMS °The main symptom of this condition is pain in the jaw, lips, eyes, nose, scalp, forehead, and face. The pain may be intense, stabbing, electric, or shock-like. °DIAGNOSIS °This condition is diagnosed with a physical exam. A CT scan or MRI may be done to rule out other conditions that can cause facial pain. °TREATMENT °This condition may be treated with: °· Avoiding the things that trigger your attacks. °· Pain medicine. °· Surgery. This may be done in severe cases if other medical treatment does not provide relief. °HOME CARE INSTRUCTIONS °· Take over-the-counter and prescription medicines only as told by your health care provider. °· If you wish to get pregnant, talk with your health care provider before you start trying to get pregnant. °· Avoid the things that trigger your attacks. It may help to: °¨ Chew on the unaffected side of your mouth. °¨ Avoid touching your face. °¨ Avoid blasts of hot or cold air. °SEEK MEDICAL CARE IF: °· Your pain medicine is not helping. °· You develop new, unexplained symptoms, such as: °¨ Double vision. °¨ Facial weakness. °¨ Changes in hearing or balance. °· You become pregnant. °SEEK IMMEDIATE MEDICAL CARE IF: °· Your pain is unbearable, and your pain medicine does not help. °  °This information is not  intended to replace advice given to you by your health care provider. Make sure you discuss any questions you have with your health care provider. °  °Document Released: 01/28/2000 Document Revised: 10/21/2014 Document Reviewed: 05/25/2014 °Elsevier Interactive Patient Education ©2016 [employer] Inc. ° °

## 2015-02-16 ENCOUNTER — Other Ambulatory Visit: Payer: Self-pay

## 2015-02-16 ENCOUNTER — Telehealth: Payer: Self-pay | Admitting: Family

## 2015-02-16 DIAGNOSIS — J410 Simple chronic bronchitis: Secondary | ICD-10-CM

## 2015-02-16 MED ORDER — ALBUTEROL SULFATE HFA 108 (90 BASE) MCG/ACT IN AERS: 2.0000 | INHALATION_SPRAY | Freq: Four times a day (QID) | RESPIRATORY_TRACT | Status: DC | PRN

## 2015-02-16 NOTE — Telephone Encounter (Signed)
Patient aware, prednisone should continue to work even after he finishes the last dose.  Drink more fluids,  Rest and use OTC medications for minor symptoms.  Must follow up with provider if gets worse.

## 2015-03-01 ENCOUNTER — Encounter: Payer: Self-pay | Admitting: Family Medicine

## 2015-03-01 ENCOUNTER — Ambulatory Visit: Payer: Medicare Other | Admitting: Family

## 2015-03-01 ENCOUNTER — Ambulatory Visit (INDEPENDENT_AMBULATORY_CARE_PROVIDER_SITE_OTHER): Payer: Medicare Other | Admitting: Family Medicine

## 2015-03-01 VITALS — BP 135/77 | HR 61 | Temp 97.1°F | Ht 69.0 in | Wt 276.0 lb

## 2015-03-01 DIAGNOSIS — J441 Chronic obstructive pulmonary disease with (acute) exacerbation: Secondary | ICD-10-CM

## 2015-03-01 MED ORDER — DOXYCYCLINE HYCLATE 100 MG PO TABS: 100.0000 mg | ORAL_TABLET | Freq: Two times a day (BID) | ORAL | Status: DC

## 2015-03-01 MED ORDER — PREDNISONE 20 MG PO TABS: ORAL_TABLET | ORAL | Status: DC

## 2015-03-01 NOTE — Progress Notes (Signed)
BP 135/77 mmHg  Pulse 61  Temp(Src) 97.1 F (36.2 C) (Oral)  Ht '5\' 9"'$  (1.753 m)  Wt 276 lb (125.193 kg)  BMI 40.74 kg/m2   Subjective:    Patient ID: Matthew [name], male    DOB: Feb 08, 1961, 55 y.o.   MRN: [id number]  HPI: DAVINDER HAFF is a 55 y.o. male presenting on 03/01/2015 for Cough, chest congestion and Sinusitis   HPI Chest congestion and sinus congestion and wheezing Patient has been having recurrent issues with bronchitis the past few years. Initially he was seen here on 02/12/2015 and placed on an antibiotic and steroid injection. He improved after that point but then over the past week he has started having the same symptoms again. He does have inhalers such as Breo Ellipta and rescue inhaler Proventil. He continues to smoke without letting up. He denies any fevers or chills. He does complain of some shortness of breath that is increasing upon ambulation. He also complains of some wheezing and has been using his rescue inhaler 3-5 times daily. He normally uses it once to twice daily. This is been worsening over the past week.  Relevant past medical, surgical, family and social history reviewed and updated as indicated. Interim medical history since our last visit reviewed. Allergies and medications reviewed and updated.  Review of Systems  Constitutional: Negative for fever and chills.  HENT: Positive for congestion, postnasal drip, rhinorrhea, sinus pressure, sneezing and sore throat. Negative for ear discharge, ear pain and voice change.   Eyes: Negative for pain, discharge, redness and visual disturbance.  Respiratory: Positive for cough, chest tightness, shortness of breath and wheezing.   Cardiovascular: Negative for chest pain, palpitations and leg swelling.  Gastrointestinal: Negative for abdominal pain, diarrhea and constipation.  Genitourinary: Negative for difficulty urinating.  Musculoskeletal: Negative for back pain and gait problem.  Skin: Negative for rash.    Neurological: Negative for syncope, light-headedness and headaches.  All other systems reviewed and are negative.   Per HPI unless specifically indicated above     Medication List       This list is accurate as of: 03/01/15 11:59 AM.  Always use your most recent med list.               albuterol 108 (90 Base) MCG/ACT inhaler  Commonly known as:  PROVENTIL HFA;VENTOLIN HFA  Inhale 2 puffs into the lungs every 6 (six) hours as needed for wheezing or shortness of breath.     amLODipine 10 MG tablet  Commonly known as:  NORVASC  Take 1 tablet (10 mg total) by mouth daily.     atorvastatin 40 MG tablet  Commonly known as:  LIPITOR  Take 1 tablet (40 mg total) by mouth daily.     carbamazepine 200 MG tablet  Commonly known as:  TEGRETOL  TAKE 2 TABLETS BY MOUTH 4 TIMES A DAY     clorazepate 7.5 MG tablet  Commonly known as:  TRANXENE  One po qid     doxycycline 100 MG tablet  Commonly known as:  VIBRA-TABS  Take 1 tablet (100 mg total) by mouth 2 (two) times daily. 1 po bid     Fluticasone Furoate-Vilanterol 100-25 MCG/INH Aepb  Commonly known as:  BREO ELLIPTA  Inhale 1 puff into the lungs daily.     hydrochlorothiazide 12.5 MG tablet  Commonly known as:  HYDRODIURIL  Take 1 tablet (12.5 mg total) by mouth daily.     HYDROcodone-acetaminophen 7.5-325 MG  tablet  Commonly known as:  NORCO  Take 1 tablet by mouth every 6 (six) hours as needed for moderate pain.     predniSONE 20 MG tablet  Commonly known as:  DELTASONE  Take 3 tabs daily for 1 week, then 2 tabs daily for week 2, then 1 tab daily for week 3.     pregabalin 100 MG capsule  Commonly known as:  LYRICA  Take 1 capsule (100 mg total) by mouth 3 (three) times daily.     tadalafil 5 MG tablet  Commonly known as:  CIALIS  Take 1 tablet (5 mg total) by mouth daily as needed for erectile dysfunction.     Vitamin D (Ergocalciferol) 50000 units Caps capsule  Commonly known as:  DRISDOL  Take 1 capsule  (50,000 Units total) by mouth every 7 (seven) days.           Objective:    BP 135/77 mmHg  Pulse 61  Temp(Src) 97.1 F (36.2 C) (Oral)  Ht '5\' 9"'$  (1.753 m)  Wt 276 lb (125.193 kg)  BMI 40.74 kg/m2  Wt Readings from Last 3 Encounters:  03/01/15 276 lb (125.193 kg)  02/11/15 272 lb (123.378 kg)  02/02/15 266 lb (120.657 kg)    Physical Exam  Constitutional: He is oriented to person, place, and time. He appears well-developed and well-nourished. No distress.  HENT:  Right Ear: Tympanic membrane, external ear and ear canal normal.  Left Ear: Tympanic membrane, external ear and ear canal normal.  Nose: Mucosal edema and rhinorrhea present. No sinus tenderness. No epistaxis. Right sinus exhibits maxillary sinus tenderness. Right sinus exhibits no frontal sinus tenderness. Left sinus exhibits maxillary sinus tenderness. Left sinus exhibits no frontal sinus tenderness.  Mouth/Throat: Uvula is midline and mucous membranes are normal. Posterior oropharyngeal edema and posterior oropharyngeal erythema present. No oropharyngeal exudate or tonsillar abscesses.  Eyes: Conjunctivae and EOM are normal. Pupils are equal, round, and reactive to light. Right eye exhibits no discharge. No scleral icterus.  Neck: Neck supple. No thyromegaly present.  Cardiovascular: Normal rate, regular rhythm, normal heart sounds and intact distal pulses.   No murmur heard. Pulmonary/Chest: Effort normal. No respiratory distress. He has wheezes. He has no rales. He exhibits no tenderness.  Musculoskeletal: Normal range of motion. He exhibits no edema.  Lymphadenopathy:    He has no cervical adenopathy.  Neurological: He is alert and oriented to person, place, and time. Coordination normal.  Skin: Skin is warm and dry. No rash noted. He is not diaphoretic.  Psychiatric: He has a normal mood and affect. His behavior is normal.  Nursing note and vitals reviewed.   Results for orders placed or performed in visit on  11/24/14  CMP14+EGFR  Result Value Ref Range   Glucose 122 (H) 65 - 99 mg/dL   BUN 6 6 - 24 mg/dL   Creatinine, Ser 0.73 (L) 0.76 - 1.27 mg/dL   GFR calc non Af Amer 104 >59 mL/min/1.73   GFR calc Af Amer 121 >59 mL/min/1.73   BUN/Creatinine Ratio 8 (L) 9 - 20   Sodium 137 134 - 144 mmol/L   Potassium 4.3 3.5 - 5.2 mmol/L   Chloride 96 (L) 97 - 108 mmol/L   CO2 24 18 - 29 mmol/L   Calcium 8.9 8.7 - 10.2 mg/dL   Total Protein 6.7 6.0 - 8.5 g/dL   Albumin 4.0 3.5 - 5.5 g/dL   Globulin, Total 2.7 1.5 - 4.5 g/dL   Albumin/Globulin Ratio 1.5  1.1 - 2.5   Bilirubin Total <0.2 0.0 - 1.2 mg/dL   Alkaline Phosphatase 115 39 - 117 IU/L   AST 19 0 - 40 IU/L   ALT 28 0 - 44 IU/L  PTH, Intact and Calcium  Result Value Ref Range   PTH 24 15 - 65 pg/mL   PTH Comment   Hepatitis C antibody  Result Value Ref Range   Hep C Virus Ab <0.1 0.0 - 0.9 s/co ratio      Assessment & Plan:   Problem List Items Addressed This Visit    None    Visit Diagnoses    COPD exacerbation ([place])    -  Primary    Likely will need additional maintenance medications in the future. Return in one week for checkup    Relevant Medications    predniSONE (DELTASONE) 20 MG tablet    doxycycline (VIBRA-TABS) 100 MG tablet        Follow up plan: Return in about 1 week (around 03/08/2015), or if symptoms worsen or fail to improve, for Follow-up COPD with [facility].  Counseling provided for all of the vaccine components No orders of the defined types were placed in this encounter.    Caryl Pina, MD [facility] 03/01/2015, 11:59 AM

## 2015-03-02 ENCOUNTER — Ambulatory Visit: Payer: Medicare Other | Admitting: Family Medicine

## 2015-03-04 ENCOUNTER — Other Ambulatory Visit: Payer: Self-pay | Admitting: Family

## 2015-03-04 ENCOUNTER — Ambulatory Visit: Payer: Medicare Other | Admitting: Family Medicine

## 2015-03-09 ENCOUNTER — Ambulatory Visit: Payer: Medicare Other | Admitting: Family

## 2015-03-09 ENCOUNTER — Ambulatory Visit (INDEPENDENT_AMBULATORY_CARE_PROVIDER_SITE_OTHER): Payer: Medicare Other | Admitting: Family

## 2015-03-09 ENCOUNTER — Encounter: Payer: Self-pay | Admitting: Family

## 2015-03-09 VITALS — BP 135/80 | HR 56 | Temp 97.0°F | Ht 69.0 in | Wt 272.0 lb

## 2015-03-09 DIAGNOSIS — E663 Overweight: Secondary | ICD-10-CM | POA: Insufficient documentation

## 2015-03-09 DIAGNOSIS — G894 Chronic pain syndrome: Secondary | ICD-10-CM

## 2015-03-09 DIAGNOSIS — J42 Unspecified chronic bronchitis: Secondary | ICD-10-CM | POA: Diagnosis not present

## 2015-03-09 DIAGNOSIS — G500 Trigeminal neuralgia: Secondary | ICD-10-CM

## 2015-03-09 DIAGNOSIS — Z720 Tobacco use: Secondary | ICD-10-CM

## 2015-03-09 DIAGNOSIS — I10 Essential (primary) hypertension: Secondary | ICD-10-CM

## 2015-03-09 DIAGNOSIS — E559 Vitamin D deficiency, unspecified: Secondary | ICD-10-CM

## 2015-03-09 DIAGNOSIS — F17200 Nicotine dependence, unspecified, uncomplicated: Secondary | ICD-10-CM

## 2015-03-09 DIAGNOSIS — Z125 Encounter for screening for malignant neoplasm of prostate: Secondary | ICD-10-CM | POA: Diagnosis not present

## 2015-03-09 DIAGNOSIS — J441 Chronic obstructive pulmonary disease with (acute) exacerbation: Secondary | ICD-10-CM

## 2015-03-09 DIAGNOSIS — Z6841 Body Mass Index (BMI) 40.0 and over, adult: Secondary | ICD-10-CM

## 2015-03-09 DIAGNOSIS — N529 Male erectile dysfunction, unspecified: Secondary | ICD-10-CM | POA: Diagnosis not present

## 2015-03-09 DIAGNOSIS — E785 Hyperlipidemia, unspecified: Secondary | ICD-10-CM | POA: Diagnosis not present

## 2015-03-09 DIAGNOSIS — M1990 Unspecified osteoarthritis, unspecified site: Secondary | ICD-10-CM

## 2015-03-09 DIAGNOSIS — E871 Hypo-osmolality and hyponatremia: Secondary | ICD-10-CM

## 2015-03-09 DIAGNOSIS — F4323 Adjustment disorder with mixed anxiety and depressed mood: Secondary | ICD-10-CM | POA: Diagnosis not present

## 2015-03-09 DIAGNOSIS — E669 Obesity, unspecified: Secondary | ICD-10-CM | POA: Insufficient documentation

## 2015-03-09 DIAGNOSIS — Z1211 Encounter for screening for malignant neoplasm of colon: Secondary | ICD-10-CM

## 2015-03-09 MED ORDER — HYDROCODONE-ACETAMINOPHEN 7.5-325 MG PO TABS
1.0000 | ORAL_TABLET | Freq: Four times a day (QID) | ORAL | Status: DC | PRN
Start: 2015-03-09 — End: 2015-04-01

## 2015-03-09 NOTE — Patient Instructions (Signed)
Chronic Obstructive Pulmonary Disease Chronic obstructive pulmonary disease (COPD) is a common lung condition in which airflow from the lungs is limited. COPD is a general term that can be used to describe many different lung problems that limit airflow, including both chronic bronchitis and emphysema. If you have COPD, your lung function will probably never return to normal, but there are measures you can take to improve lung function and make yourself feel better. CAUSES   Smoking (common).  Exposure to secondhand smoke.  Genetic problems.  Chronic inflammatory lung diseases or recurrent infections. SYMPTOMS  Shortness of breath, especially with physical activity.  Deep, persistent (chronic) cough with a large amount of thick mucus.  Wheezing.  Rapid breaths (tachypnea).  Gray or bluish discoloration (cyanosis) of the skin, especially in your fingers, toes, or lips.  Fatigue.  Weight loss.  Frequent infections or episodes when breathing symptoms become much worse (exacerbations).  Chest tightness. DIAGNOSIS Your health care provider will take a medical history and perform a physical examination to diagnose COPD. Additional tests for COPD may include:  Lung (pulmonary) function tests.  Chest X-ray.  CT scan.  Blood tests. TREATMENT  Treatment for COPD may include:  Inhaler and nebulizer medicines. These help manage the symptoms of COPD and make your breathing more comfortable.  Supplemental oxygen. Supplemental oxygen is only helpful if you have a low oxygen level in your blood.  Exercise and physical activity. These are beneficial for nearly all people with COPD.  Lung surgery or transplant.  Nutrition therapy to gain weight, if you are underweight.  Pulmonary rehabilitation. This may involve working with a team of health care providers and specialists, such as respiratory, occupational, and physical therapists. HOME CARE INSTRUCTIONS  Take all medicines  (inhaled or pills) as directed by your health care provider.  Avoid over-the-counter medicines or cough syrups that dry up your airway (such as antihistamines) and slow down the elimination of secretions unless instructed otherwise by your health care provider.  If you are a smoker, the most important thing that you can do is stop smoking. Continuing to smoke will cause further lung damage and breathing trouble. Ask your health care provider for help with quitting smoking. He or she can direct you to community resources or hospitals that provide support.  Avoid exposure to irritants such as smoke, chemicals, and fumes that aggravate your breathing.  Use oxygen therapy and pulmonary rehabilitation if directed by your health care provider. If you require home oxygen therapy, ask your health care provider whether you should purchase a pulse oximeter to measure your oxygen level at home.  Avoid contact with individuals who have a contagious illness.  Avoid extreme temperature and humidity changes.  Eat healthy foods. Eating smaller, more frequent meals and resting before meals may help you maintain your strength.  Stay active, but balance activity with periods of rest. Exercise and physical activity will help you maintain your ability to do things you want to do.  Preventing infection and hospitalization is very important when you have COPD. Make sure to receive all the vaccines your health care provider recommends, especially the pneumococcal and influenza vaccines. Ask your health care provider whether you need a pneumonia vaccine.  Learn and use relaxation techniques to manage stress.  Learn and use controlled breathing techniques as directed by your health care provider. Controlled breathing techniques include:  Pursed lip breathing. Start by breathing in (inhaling) through your nose for 1 second. Then, purse your lips as if you were   going to whistle and breathe out (exhale) through the  pursed lips for 2 seconds.  Diaphragmatic breathing. Start by putting one hand on your abdomen just above your waist. Inhale slowly through your nose. The hand on your abdomen should move out. Then purse your lips and exhale slowly. You should be able to feel the hand on your abdomen moving in as you exhale.  Learn and use controlled coughing to clear mucus from your lungs. Controlled coughing is a series of short, progressive coughs. The steps of controlled coughing are: 1. Lean your head slightly forward. 2. Breathe in deeply using diaphragmatic breathing. 3. Try to hold your breath for 3 seconds. 4. Keep your mouth slightly open while coughing twice. 5. Spit any mucus out into a tissue. 6. Rest and repeat the steps once or twice as needed. SEEK MEDICAL CARE IF:  You are coughing up more mucus than usual.  There is a change in the color or thickness of your mucus.  Your breathing is more labored than usual.  Your breathing is faster than usual. SEEK IMMEDIATE MEDICAL CARE IF:  You have shortness of breath while you are resting.  You have shortness of breath that prevents you from:  Being able to talk.  Performing your usual physical activities.  You have chest pain lasting longer than 5 minutes.  Your skin color is more cyanotic than usual.  You measure low oxygen saturations for longer than 5 minutes with a pulse oximeter. MAKE SURE YOU:  Understand these instructions.  Will watch your condition.  Will get help right away if you are not doing well or get worse.   This information is not intended to replace advice given to you by your health care provider. Make sure you discuss any questions you have with your health care provider.   Document Released: 11/09/2004 Document Revised: 02/20/2014 Document Reviewed: 09/26/2012 Elsevier Interactive Patient Education 2016 Elsevier Inc[employer]  

## 2015-03-09 NOTE — Progress Notes (Signed)
Subjective:    Patient ID: Matthew [name], male    DOB: [birth year], 55 y.o.   MRN: [id number]  PT presents to the office today for chronic follow up.  Hypertension This is a chronic problem. The current episode started more than 1 year ago. The problem has been resolved since onset. The problem is controlled. Associated symptoms include anxiety. Pertinent negatives include no headaches, palpitations or peripheral edema. Risk factors for coronary artery disease include dyslipidemia, family history, male gender and sedentary lifestyle. Past treatments include calcium channel blockers and diuretics. The current treatment provides moderate improvement. There is no history of kidney disease, CAD/MI, CVA, heart failure or a thyroid problem. There is no history of sleep apnea.  Hyperlipidemia This is a chronic problem. The current episode started more than 1 year ago. The problem is controlled. Recent lipid tests were reviewed and are normal. He has no history of diabetes. Factors aggravating his hyperlipidemia include smoking. Pertinent negatives include no leg pain. Current antihyperlipidemic treatment includes statins. Risk factors for coronary artery disease include dyslipidemia, hypertension, male sex, a sedentary lifestyle and family history.  Back Pain This is a chronic problem. The current episode started more than 1 month ago. The problem occurs constantly. The problem has been waxing and waning since onset. The pain is present in the thoracic spine. The quality of the pain is described as aching. The pain is at a severity of 7/10. The pain is moderate. The symptoms are aggravated by twisting and position. Pertinent negatives include no bladder incontinence, bowel incontinence, fever, headaches, leg pain, numbness or tingling. He has tried NSAIDs and analgesics for the symptoms. The treatment provided mild relief.  Anxiety Presents for follow-up visit. Onset was 1 to 5 years ago. The problem has been  waxing and waning. Symptoms include excessive worry and nervous/anxious behavior. Patient reports no decreased concentration, depressed mood, insomnia or palpitations. Symptoms occur occasionally. The severity of symptoms is mild.   His past medical history is significant for anxiety/panic attacks and depression. The treatment provided mild relief. Compliance with prior treatments has been good.  Arthritis Presents for follow-up visit. The disease course has been worsening. He complains of pain and joint warmth. He reports no joint swelling. Affected locations include the right hip, left hip and right knee. His pain is at a severity of 7/10. Associated symptoms include pain while resting. Pertinent negatives include no fever. His past medical history is significant for chronic back pain and osteoarthritis. Past treatments include activity, NSAIDs and rest. The treatment provided moderate relief.      Review of Systems  Constitutional: Negative.  Negative for fever.  HENT: Negative.   Respiratory: Negative.   Cardiovascular: Negative.  Negative for palpitations.  Gastrointestinal: Negative.  Negative for bowel incontinence.  Endocrine: Negative.   Genitourinary: Negative.  Negative for bladder incontinence.  Musculoskeletal: Positive for back pain and arthritis. Negative for joint swelling.  Neurological: Negative.  Negative for tingling, numbness and headaches.  Hematological: Negative.   Psychiatric/Behavioral: Negative for decreased concentration. The patient is nervous/anxious. The patient does not have insomnia.   All other systems reviewed and are negative.      Objective:   Physical Exam  Constitutional: He is oriented to person, place, and time. He appears well-developed and well-nourished. No distress.  HENT:  Head: Normocephalic.  Right Ear: External ear normal.  Left Ear: External ear normal.  Nose: Nose normal.  Mouth/Throat: Oropharynx is clear and moist.  Eyes: Pupils  are equal,  round, and reactive to light. Right eye exhibits no discharge. Left eye exhibits no discharge.  Neck: Normal range of motion. Neck supple. No thyromegaly present.  Cardiovascular: Normal rate, regular rhythm, normal heart sounds and intact distal pulses.   No murmur heard. Pulmonary/Chest: Effort normal and breath sounds normal. No respiratory distress. He has no wheezes.  Abdominal: Soft. Bowel sounds are normal. He exhibits no distension. There is no tenderness.  Musculoskeletal: Normal range of motion. He exhibits no edema or tenderness.  Neurological: He is alert and oriented to person, place, and time. He has normal reflexes. No cranial nerve deficit.  Skin: Skin is warm and dry. No rash noted. No erythema.  Psychiatric: He has a normal mood and affect. His behavior is normal. Judgment and thought content normal.  Vitals reviewed.     BP 135/80 mmHg  Pulse 56  Temp(Src) 97 F (36.1 C) (Oral)  Ht '5\' 9"'$  (1.753 m)  Wt 272 lb (123.378 kg)  BMI 40.15 kg/m2     Assessment & Plan:  1. Essential hypertension - CMP14+EGFR  2. Chronic bronchitis, unspecified chronic bronchitis type ([place]) - CMP14+EGFR  3. Trigeminal neuralgia - CMP14+EGFR  4. Erectile dysfunction, unspecified erectile dysfunction type - CMP14+EGFR  5. Adjustment disorder with mixed anxiety and depressed mood - CMP14+EGFR  6. Chronic pain syndrome - CMP14+EGFR  7. Hyperlipidemia - CMP14+EGFR - Lipid panel  8. Smoker - CMP14+EGFR  9. Vitamin D deficiency - CMP14+EGFR - VITAMIN D 25 Hydroxy (Vit-D Deficiency, Fractures)  10. Prostate cancer screening - CMP14+EGFR - PSA, total and free  11. Arthritis - HYDROcodone-acetaminophen (NORCO) 7.5-325 MG tablet; Take 1 tablet by mouth every 6 (six) hours as needed for moderate pain.  Dispense: 90 tablet; Refill: 0  12. COPD exacerbation ([place])  13. Colon cancer screening - Fecal occult blood, imunochemical; Future  14. Morbid obesity with BMI  of 40.0-44.9, adult ([place])   Continue all meds Labs pending Health Maintenance reviewed Diet and exercise encouraged RTO 3 months  Evelina Dun, FNP

## 2015-03-10 LAB — CMP14+EGFR
A/G RATIO: 1.7 (ref 1.1–2.5)
ALBUMIN: 4.2 g/dL (ref 3.5–5.5)
ALK PHOS: 101 IU/L (ref 39–117)
ALT: 26 IU/L (ref 0–44)
AST: 23 IU/L (ref 0–40)
BILIRUBIN TOTAL: 0.4 mg/dL (ref 0.0–1.2)
BUN / CREAT RATIO: 9 (ref 9–20)
BUN: 6 mg/dL (ref 6–24)
CHLORIDE: 88 mmol/L — AB (ref 96–106)
CO2: 21 mmol/L (ref 18–29)
Calcium: 8.6 mg/dL — ABNORMAL LOW (ref 8.7–10.2)
Creatinine, Ser: 0.68 mg/dL — ABNORMAL LOW (ref 0.76–1.27)
GFR calc Af Amer: 124 mL/min/{1.73_m2} (ref >59)
GFR calc non Af Amer: 108 mL/min/{1.73_m2} (ref >59)
GLUCOSE: 107 mg/dL — AB (ref 65–99)
Globulin, Total: 2.5 g/dL (ref 1.5–4.5)
POTASSIUM: 3.9 mmol/L (ref 3.5–5.2)
Sodium: 128 mmol/L — ABNORMAL LOW (ref 134–144)
Total Protein: 6.7 g/dL (ref 6.0–8.5)

## 2015-03-10 LAB — LIPID PANEL
CHOLESTEROL TOTAL: 145 mg/dL (ref 100–199)
Chol/HDL Ratio: 2.5 ratio units (ref 0.0–5.0)
HDL: 58 mg/dL (ref >39)
LDL Calculated: 60 mg/dL (ref 0–99)
Triglycerides: 133 mg/dL (ref 0–149)
VLDL CHOLESTEROL CAL: 27 mg/dL (ref 5–40)

## 2015-03-10 LAB — PSA, TOTAL AND FREE
PROSTATE SPECIFIC AG, SERUM: 1 ng/mL (ref 0.0–4.0)
PSA FREE PCT: 22 %
PSA FREE: 0.22 ng/mL

## 2015-03-10 LAB — VITAMIN D 25 HYDROXY (VIT D DEFICIENCY, FRACTURES): Vit D, 25-Hydroxy: 9.9 ng/mL — ABNORMAL LOW (ref 30.0–100.0)

## 2015-03-11 ENCOUNTER — Other Ambulatory Visit: Payer: Self-pay | Admitting: Family

## 2015-03-11 NOTE — Telephone Encounter (Signed)
Chlorazepate last filled 02/06/15, lyrica last filled 01/26/15. Last seen 03/09/15. Route to pool, both to be called in at [facility]

## 2015-03-11 NOTE — Addendum Note (Signed)
Addended by: Thana Ates on: 03/11/2015 11:22 AM   Modules accepted: Orders

## 2015-03-12 NOTE — Telephone Encounter (Signed)
Scripts called to [facility].

## 2015-03-15 ENCOUNTER — Other Ambulatory Visit: Payer: Medicare Other

## 2015-03-15 DIAGNOSIS — Z1211 Encounter for screening for malignant neoplasm of colon: Secondary | ICD-10-CM

## 2015-03-17 LAB — FECAL OCCULT BLOOD, IMMUNOCHEMICAL: FECAL OCCULT BLD: NEGATIVE

## 2015-03-22 ENCOUNTER — Telehealth: Payer: Self-pay | Admitting: Family

## 2015-03-22 ENCOUNTER — Ambulatory Visit (INDEPENDENT_AMBULATORY_CARE_PROVIDER_SITE_OTHER): Payer: Medicare Other | Admitting: Family

## 2015-03-22 ENCOUNTER — Encounter: Payer: Self-pay | Admitting: Family

## 2015-03-22 VITALS — BP 130/82 | HR 59 | Temp 97.9°F | Ht 69.0 in | Wt 267.0 lb

## 2015-03-22 DIAGNOSIS — K029 Dental caries, unspecified: Secondary | ICD-10-CM | POA: Diagnosis not present

## 2015-03-22 DIAGNOSIS — K047 Periapical abscess without sinus: Secondary | ICD-10-CM

## 2015-03-22 MED ORDER — CLINDAMYCIN HCL 300 MG PO CAPS: 300.0000 mg | ORAL_CAPSULE | Freq: Four times a day (QID) | ORAL | Status: DC

## 2015-03-22 NOTE — Telephone Encounter (Signed)
Question answered. 

## 2015-03-22 NOTE — Progress Notes (Signed)
   Subjective:    Patient ID: Matthew [name], male    DOB: Jun 09, 1960, 55 y.o.   MRN: [id number]  HPI PT presents to the office with dental pain. Pt states this pain in his lower jaw started over the weekend and he can not stand the pain. Pt states he is his having constant aching 10 out 10. Pt states he has a dentist appt on Friday. PT states he is having chills and having swelling of his gums. PT states "all my teeth are bad and need them pulled".   Review of Systems  Constitutional: Negative.   HENT: Negative.   Respiratory: Negative.   Cardiovascular: Negative.   Gastrointestinal: Negative.   Endocrine: Negative.   Genitourinary: Negative.   Musculoskeletal: Negative.   Neurological: Negative.   Hematological: Negative.   Psychiatric/Behavioral: Negative.   All other systems reviewed and are negative.      Objective:   Physical Exam  Constitutional: He is oriented to person, place, and time. He appears well-developed and well-nourished. No distress.  HENT:  Head: Normocephalic.  Mouth/Throat: Abnormal dentition. Dental abscesses and dental caries present.  Eyes: Pupils are equal, round, and reactive to light. Right eye exhibits no discharge. Left eye exhibits no discharge.  Neck: Normal range of motion. Neck supple. No thyromegaly present.  Cardiovascular: Normal rate, regular rhythm, normal heart sounds and intact distal pulses.   No murmur heard. Pulmonary/Chest: Effort normal and breath sounds normal. No respiratory distress. He has no wheezes.  Abdominal: Soft. Bowel sounds are normal. He exhibits no distension. There is no tenderness.  Musculoskeletal: Normal range of motion. He exhibits no edema or tenderness.  Neurological: He is alert and oriented to person, place, and time. He has normal reflexes. No cranial nerve deficit.  Skin: Skin is warm and dry. No rash noted. No erythema.  Psychiatric: He has a normal mood and affect. His behavior is normal. Judgment and thought  content normal.  Vitals reviewed.     BP 130/82 mmHg  Pulse 59  Temp(Src) 97.9 F (36.6 C) (Oral)  Ht 5\' 9"  (1.753 m)  Wt 267 lb (121.11 kg)  BMI 39.41 kg/m2     Assessment & Plan:  1. Abscessed tooth - clindamycin (CLEOCIN) 300 MG capsule; Take 1 capsule (300 mg total) by mouth 4 (four) times daily.  Dispense: 28 capsule; Refill: 0  2. Dental caries  Keep Dental appt Wash mouth with salt and warm water after meals Take medication as prescribed RTO prn   Evelina Dun, FNP

## 2015-03-22 NOTE — Patient Instructions (Signed)

## 2015-03-26 ENCOUNTER — Other Ambulatory Visit: Payer: Self-pay | Admitting: Family Medicine

## 2015-03-26 NOTE — Telephone Encounter (Signed)
Last seen 03/22/15  [facility]

## 2015-03-29 ENCOUNTER — Ambulatory Visit: Payer: Medicare Other | Admitting: Neurology

## 2015-04-01 ENCOUNTER — Telehealth: Payer: Self-pay | Admitting: Family

## 2015-04-01 DIAGNOSIS — M1990 Unspecified osteoarthritis, unspecified site: Secondary | ICD-10-CM

## 2015-04-01 MED ORDER — HYDROCODONE-ACETAMINOPHEN 7.5-325 MG PO TABS
1.0000 | ORAL_TABLET | Freq: Four times a day (QID) | ORAL | Status: DC | PRN
Start: 2015-04-01 — End: 2015-05-03

## 2015-04-01 NOTE — Telephone Encounter (Signed)
RX ready for pick up 

## 2015-04-02 NOTE — Telephone Encounter (Signed)
Aware ,script ready. 

## 2015-04-15 ENCOUNTER — Other Ambulatory Visit: Payer: Self-pay

## 2015-04-15 DIAGNOSIS — J42 Unspecified chronic bronchitis: Secondary | ICD-10-CM

## 2015-04-15 DIAGNOSIS — G500 Trigeminal neuralgia: Secondary | ICD-10-CM

## 2015-04-15 MED ORDER — CARBAMAZEPINE 200 MG PO TABS: ORAL_TABLET | ORAL | Status: DC

## 2015-04-15 MED ORDER — FLUTICASONE FUROATE-VILANTEROL 100-25 MCG/INH IN AEPB: 1.0000 | INHALATION_SPRAY | Freq: Every day | RESPIRATORY_TRACT | Status: DC

## 2015-04-19 ENCOUNTER — Ambulatory Visit: Payer: Medicare Other | Admitting: Neurology

## 2015-04-20 ENCOUNTER — Other Ambulatory Visit: Payer: Self-pay | Admitting: *Deleted

## 2015-04-20 DIAGNOSIS — G500 Trigeminal neuralgia: Secondary | ICD-10-CM

## 2015-05-03 ENCOUNTER — Ambulatory Visit (INDEPENDENT_AMBULATORY_CARE_PROVIDER_SITE_OTHER): Payer: Medicare Other | Admitting: Family

## 2015-05-03 ENCOUNTER — Encounter: Payer: Self-pay | Admitting: Family

## 2015-05-03 VITALS — BP 148/85 | HR 58 | Temp 97.0°F | Ht 69.0 in | Wt 268.0 lb

## 2015-05-03 DIAGNOSIS — M7022 Olecranon bursitis, left elbow: Secondary | ICD-10-CM | POA: Diagnosis not present

## 2015-05-03 DIAGNOSIS — L0291 Cutaneous abscess, unspecified: Secondary | ICD-10-CM | POA: Diagnosis not present

## 2015-05-03 DIAGNOSIS — M1990 Unspecified osteoarthritis, unspecified site: Secondary | ICD-10-CM

## 2015-05-03 MED ORDER — METHYLPREDNISOLONE ACETATE 40 MG/ML IJ SUSP
40.0000 mg | Freq: Once | INTRAMUSCULAR | Status: AC
  Administered 2015-05-03: 40 mg via INTRA_ARTICULAR

## 2015-05-03 MED ORDER — BUPIVACAINE HCL 0.25 % IJ SOLN
1.0000 mL | Freq: Once | INTRAMUSCULAR | Status: AC
  Administered 2015-05-03: 1 mL via INTRA_ARTICULAR

## 2015-05-03 MED ORDER — SULFAMETHOXAZOLE-TRIMETHOPRIM 800-160 MG PO TABS: 1.0000 | ORAL_TABLET | Freq: Two times a day (BID) | ORAL | Status: DC

## 2015-05-03 MED ORDER — HYDROCODONE-ACETAMINOPHEN 7.5-325 MG PO TABS: 1.0000 | ORAL_TABLET | Freq: Four times a day (QID) | ORAL | Status: DC | PRN

## 2015-05-03 NOTE — Patient Instructions (Signed)

## 2015-05-03 NOTE — Progress Notes (Signed)
   Subjective:    Patient ID: Matthew [name], male    DOB: Nov 19, 1960, 55 y.o.   MRN: [id number]  HPI Pt presents to the office today with left elbow swelling. Pt has been diagnosed with bursitis in the past and has had fluid removed from his elbow in the past. PT states he also had an abscess in his left armpit. Pt states he noticed the abscess about a week ago, but "busted about a week ago". Pt reports a yellow drainage. Pt states the pain is better now that is it is healing".    Review of Systems  Constitutional: Negative.   HENT: Negative.   Respiratory: Negative.   Cardiovascular: Negative.   Gastrointestinal: Negative.   Endocrine: Negative.   Genitourinary: Negative.   Musculoskeletal: Negative.   Neurological: Negative.   Hematological: Negative.   Psychiatric/Behavioral: Negative.   All other systems reviewed and are negative.      Objective:   Physical Exam  Constitutional: He is oriented to person, place, and time. He appears well-developed and well-nourished. No distress.  HENT:  Head: Normocephalic.  Eyes: Pupils are equal, round, and reactive to light. Right eye exhibits no discharge. Left eye exhibits no discharge.  Neck: Normal range of motion. Neck supple. No thyromegaly present.  Cardiovascular: Normal rate, regular rhythm, normal heart sounds and intact distal pulses.   No murmur heard. Pulmonary/Chest: Effort normal and breath sounds normal. No respiratory distress. He has no wheezes.  Abdominal: Soft. Bowel sounds are normal. He exhibits no distension. There is no tenderness.  Musculoskeletal: Normal range of motion. He exhibits no tenderness. Edema: 3+ in left elebow.  Small abscess under left arm with purulent drainage.  Neurological: He is alert and oriented to person, place, and time.  Skin: Skin is warm and dry. No rash noted. No erythema.  Psychiatric: He has a normal mood and affect. His behavior is normal. Judgment and thought content normal.  Vitals  reviewed.  leftelbow prepped with betadine. Aspirated 11 ml of sanguinous fluid removed from left elbow. Injected with Marcaine .5% plain and methylprednisolone with 22 guage needle. Patient tolerated well.   BP 148/85 mmHg  Pulse 58  Temp(Src) 97 F (36.1 C) (Oral)  Ht 5\' 9"  (1.753 m)  Wt 268 lb (121.564 kg)  BMI 39.56 kg/m2       Assessment & Plan:  1. Bursitis of elbow, left -Rest -Ice as needed - bupivacaine (MARCAINE) 0.25 % (with pres) injection 1 mL; Inject 1 mL into the articular space once. - methylPREDNISolone acetate (DEPO-MEDROL) injection 40 mg; Inject 1 mL (40 mg total) into the articular space once.  2. Arthritis - HYDROcodone-acetaminophen (NORCO) 7.5-325 MG tablet; Take 1 tablet by mouth every 6 (six) hours as needed for moderate pain.  Dispense: 90 tablet; Refill: 0  3. Abscess -Warm compresses  -Do not squeeze  -RTO prn  - sulfamethoxazole-trimethoprim (BACTRIM DS) 800-160 MG tablet; Take 1 tablet by mouth 2 (two) times daily.  Dispense: 14 tablet; Refill: 0  Evelina Dun, FNP

## 2015-05-04 ENCOUNTER — Telehealth: Payer: Self-pay

## 2015-05-04 ENCOUNTER — Other Ambulatory Visit: Payer: Self-pay | Admitting: Family

## 2015-05-04 NOTE — Telephone Encounter (Signed)
Tranxene script ready.

## 2015-05-04 NOTE — Telephone Encounter (Signed)
Insurance prior authorized Clorazdipot through 02/13/16

## 2015-05-04 NOTE — Telephone Encounter (Signed)
RX ready for pick up 

## 2015-05-04 NOTE — Telephone Encounter (Signed)
Last seen 05/03/15  [facility]

## 2015-05-23 DIAGNOSIS — M279 Disease of jaws, unspecified: Secondary | ICD-10-CM | POA: Diagnosis not present

## 2015-05-23 DIAGNOSIS — K047 Periapical abscess without sinus: Secondary | ICD-10-CM | POA: Diagnosis not present

## 2015-05-23 DIAGNOSIS — J449 Chronic obstructive pulmonary disease, unspecified: Secondary | ICD-10-CM | POA: Diagnosis not present

## 2015-05-23 DIAGNOSIS — I10 Essential (primary) hypertension: Secondary | ICD-10-CM | POA: Diagnosis not present

## 2015-05-23 DIAGNOSIS — R6884 Jaw pain: Secondary | ICD-10-CM | POA: Diagnosis not present

## 2015-05-23 DIAGNOSIS — R030 Elevated blood-pressure reading, without diagnosis of hypertension: Secondary | ICD-10-CM | POA: Diagnosis not present

## 2015-05-23 DIAGNOSIS — Z79899 Other long term (current) drug therapy: Secondary | ICD-10-CM | POA: Diagnosis not present

## 2015-05-23 DIAGNOSIS — K0889 Other specified disorders of teeth and supporting structures: Secondary | ICD-10-CM | POA: Diagnosis not present

## 2015-05-23 DIAGNOSIS — F17200 Nicotine dependence, unspecified, uncomplicated: Secondary | ICD-10-CM | POA: Diagnosis not present

## 2015-05-24 ENCOUNTER — Telehealth: Payer: Self-pay | Admitting: Family

## 2015-05-24 NOTE — Telephone Encounter (Signed)
Patient's wife advised to continue encouraging fluids and make sure all the antibiotic is taken.

## 2015-05-31 ENCOUNTER — Telehealth: Payer: Self-pay | Admitting: Family

## 2015-05-31 ENCOUNTER — Other Ambulatory Visit: Payer: Self-pay

## 2015-05-31 DIAGNOSIS — M1990 Unspecified osteoarthritis, unspecified site: Secondary | ICD-10-CM

## 2015-05-31 MED ORDER — HYDROCODONE-ACETAMINOPHEN 7.5-325 MG PO TABS: 1.0000 | ORAL_TABLET | Freq: Four times a day (QID) | ORAL | Status: DC | PRN

## 2015-05-31 NOTE — Telephone Encounter (Signed)
Set up sent to [facility] not Dr Livia Snellen

## 2015-05-31 NOTE — Telephone Encounter (Signed)
Set up and sent to Dr Livia Snellen

## 2015-05-31 NOTE — Telephone Encounter (Signed)
Detailed message left for patient that rx is ready to be picked up 

## 2015-05-31 NOTE — Telephone Encounter (Signed)
RX ready for pick up 

## 2015-05-31 NOTE — Telephone Encounter (Signed)
Last seen 05/03/15  Matthew [name]  If approved print

## 2015-06-05 ENCOUNTER — Telehealth: Payer: Self-pay | Admitting: Family

## 2015-06-05 MED ORDER — CLINDAMYCIN HCL 300 MG PO CAPS: 300.0000 mg | ORAL_CAPSULE | Freq: Four times a day (QID) | ORAL | Status: DC

## 2015-06-05 NOTE — Telephone Encounter (Signed)
Patient had 5 teeth pulled and wants to know if we can please send in an antibiotic?

## 2015-06-05 NOTE — Telephone Encounter (Signed)
Prescription sent to pharmacy.

## 2015-06-08 ENCOUNTER — Encounter: Payer: Self-pay | Admitting: Family

## 2015-06-08 ENCOUNTER — Ambulatory Visit (INDEPENDENT_AMBULATORY_CARE_PROVIDER_SITE_OTHER): Payer: Medicare Other | Admitting: Family

## 2015-06-08 ENCOUNTER — Encounter (INDEPENDENT_AMBULATORY_CARE_PROVIDER_SITE_OTHER): Payer: Self-pay

## 2015-06-08 VITALS — BP 138/88 | HR 79 | Temp 97.6°F | Ht 69.0 in | Wt 257.0 lb

## 2015-06-08 DIAGNOSIS — G500 Trigeminal neuralgia: Secondary | ICD-10-CM

## 2015-06-08 DIAGNOSIS — E559 Vitamin D deficiency, unspecified: Secondary | ICD-10-CM | POA: Diagnosis not present

## 2015-06-08 DIAGNOSIS — Z720 Tobacco use: Secondary | ICD-10-CM | POA: Diagnosis not present

## 2015-06-08 DIAGNOSIS — N529 Male erectile dysfunction, unspecified: Secondary | ICD-10-CM

## 2015-06-08 DIAGNOSIS — I10 Essential (primary) hypertension: Secondary | ICD-10-CM

## 2015-06-08 DIAGNOSIS — J42 Unspecified chronic bronchitis: Secondary | ICD-10-CM

## 2015-06-08 DIAGNOSIS — G894 Chronic pain syndrome: Secondary | ICD-10-CM | POA: Diagnosis not present

## 2015-06-08 DIAGNOSIS — F4323 Adjustment disorder with mixed anxiety and depressed mood: Secondary | ICD-10-CM

## 2015-06-08 DIAGNOSIS — E785 Hyperlipidemia, unspecified: Secondary | ICD-10-CM | POA: Diagnosis not present

## 2015-06-08 DIAGNOSIS — Z6841 Body Mass Index (BMI) 40.0 and over, adult: Secondary | ICD-10-CM

## 2015-06-08 DIAGNOSIS — F17200 Nicotine dependence, unspecified, uncomplicated: Secondary | ICD-10-CM

## 2015-06-08 LAB — CMP14+EGFR
ALK PHOS: 110 IU/L (ref 39–117)
ALT: 35 IU/L (ref 0–44)
AST: 23 IU/L (ref 0–40)
Albumin/Globulin Ratio: 1.7 (ref 1.2–2.2)
Albumin: 4.3 g/dL (ref 3.5–5.5)
BILIRUBIN TOTAL: 0.3 mg/dL (ref 0.0–1.2)
BUN / CREAT RATIO: 9 (ref 9–20)
BUN: 6 mg/dL (ref 6–24)
CHLORIDE: 91 mmol/L — AB (ref 96–106)
CO2: 24 mmol/L (ref 18–29)
Calcium: 9 mg/dL (ref 8.7–10.2)
Creatinine, Ser: 0.7 mg/dL — ABNORMAL LOW (ref 0.76–1.27)
GFR calc Af Amer: 123 mL/min/{1.73_m2} (ref >59)
GFR calc non Af Amer: 106 mL/min/{1.73_m2} (ref >59)
GLOBULIN, TOTAL: 2.5 g/dL (ref 1.5–4.5)
GLUCOSE: 104 mg/dL — AB (ref 65–99)
POTASSIUM: 4.2 mmol/L (ref 3.5–5.2)
SODIUM: 134 mmol/L (ref 134–144)
Total Protein: 6.8 g/dL (ref 6.0–8.5)

## 2015-06-08 LAB — LIPID PANEL
CHOL/HDL RATIO: 3 ratio (ref 0.0–5.0)
CHOLESTEROL TOTAL: 171 mg/dL (ref 100–199)
HDL: 57 mg/dL (ref >39)
LDL Calculated: 87 mg/dL (ref 0–99)
Triglycerides: 134 mg/dL (ref 0–149)
VLDL Cholesterol Cal: 27 mg/dL (ref 5–40)

## 2015-06-08 NOTE — Progress Notes (Signed)
Subjective:    Patient ID: Matthew [name], male    DOB: 1960/04/06, 55 y.o.   MRN: [id number]  PT presents to the office today for chronic follow up. He had a recent dental extraction with 5 teeth pulled on the lower jaw 3 days ago.   Hypertension This is a chronic problem. The current episode started more than 1 year ago. The problem has been resolved since onset. The problem is controlled. Pertinent negatives include no anxiety, blurred vision, headaches, palpitations, peripheral edema or shortness of breath. Risk factors for coronary artery disease include dyslipidemia, family history, male gender, sedentary lifestyle, obesity and smoking/tobacco exposure. Past treatments include calcium channel blockers and diuretics. The current treatment provides moderate improvement. There is no history of angina, kidney disease, CAD/MI, CVA, heart failure or a thyroid problem. There is no history of sleep apnea.  Hyperlipidemia This is a chronic problem. The current episode started more than 1 year ago. The problem is controlled. Recent lipid tests were reviewed and are normal. He has no history of diabetes. Factors aggravating his hyperlipidemia include smoking. Pertinent negatives include no shortness of breath. Current antihyperlipidemic treatment includes statins. The current treatment provides moderate improvement of lipids. Risk factors for coronary artery disease include dyslipidemia, hypertension, male sex, a sedentary lifestyle and family history.  Anxiety Presents for follow-up visit. Onset was 1 to 5 years ago. The problem has been waxing and waning. Symptoms include impotence. Patient reports no decreased concentration, depressed mood, excessive worry, insomnia, nervous/anxious behavior, palpitations, restlessness or shortness of breath. Symptoms occur occasionally. The severity of symptoms is mild. The quality of sleep is good. Nighttime awakenings: none.   There is no history of anxiety/panic attacks  or depression. Past treatments include non-benzodiazephine anxiolytics (Tranxene). The treatment provided significant relief. Compliance with prior treatments has been good. Compliance with medications is 76-100%.  Arthritis Presents for follow-up visit. The disease course has been worsening. He complains of pain and joint warmth. He reports no joint swelling. Affected locations include the right hip, left hip and right knee. His pain is at a severity of 7/10. Associated symptoms include pain while resting. His past medical history is significant for chronic back pain and osteoarthritis. Past treatments include activity, NSAIDs and rest. The treatment provided moderate relief.  COPD PT prescribed Breo daily. PT states since his teeth were removed he has not used his inhalers. Pt reports smoking at least a pack of day.    Review of Systems  Constitutional: Negative.   HENT: Positive for dental problem. Negative for ear pain and tinnitus.   Eyes: Negative.  Negative for blurred vision.  Respiratory: Positive for wheezing. Negative for cough, chest tightness and shortness of breath.   Cardiovascular: Negative.  Negative for palpitations.  Gastrointestinal: Negative.   Endocrine: Negative.   Genitourinary: Positive for impotence.  Musculoskeletal: Positive for arthritis. Negative for joint swelling.  Skin: Negative.   Allergic/Immunologic: Negative.   Neurological: Negative.  Negative for weakness and headaches.  Hematological: Negative.   Psychiatric/Behavioral: Negative for decreased concentration. The patient is not nervous/anxious and does not have insomnia.   All other systems reviewed and are negative.      Objective:   Physical Exam  Constitutional: He is oriented to person, place, and time. Vital signs are normal. He appears well-developed and well-nourished. No distress.  HENT:  Head: Normocephalic.  Right Ear: Tympanic membrane and external ear normal.  Left Ear: Tympanic  membrane and external ear normal.  Nose: Nose normal.  Mouth/Throat: Oropharynx is clear and moist.    Eyes: Pupils are equal, round, and reactive to light. Right eye exhibits no discharge. Left eye exhibits no discharge.  Neck: Normal range of motion. Neck supple. Carotid bruit is not present. No thyroid mass and no thyromegaly present.  Cardiovascular: Normal rate, regular rhythm, normal heart sounds, intact distal pulses and normal pulses.  PMI is not displaced.   No murmur heard. Pulmonary/Chest: Effort normal. No respiratory distress. He has wheezes in the right upper field, the right middle field, the right lower field, the left upper field, the left middle field and the left lower field.  Abdominal: Soft. Normal appearance and bowel sounds are normal. He exhibits no distension. There is no tenderness.  Musculoskeletal: Normal range of motion. He exhibits no edema or tenderness.  Lymphadenopathy:       Head (right side): No submental and no tonsillar adenopathy present.       Head (left side): No submental and no tonsillar adenopathy present.    He has no cervical adenopathy.  Neurological: He is alert and oriented to person, place, and time. He has normal strength and normal reflexes. No cranial nerve deficit or sensory deficit.  Skin: Skin is warm, dry and intact. No rash noted. No erythema.  Psychiatric: He has a normal mood and affect. His speech is normal and behavior is normal. Judgment and thought content normal. His mood appears not anxious. Cognition and memory are normal.  Vitals reviewed.     BP 138/88 mmHg  Pulse 79  Temp(Src) 97.6 F (36.4 C) (Oral)  Ht 5' 9" (1.753 m)  Wt 257 lb (116.574 kg)  BMI 37.93 kg/m2     Assessment & Plan:  1. Essential hypertension - CMP14+EGFR  2. Chronic bronchitis, unspecified chronic bronchitis type (HCC) - CMP14+EGFR  3. Trigeminal neuralgia - CMP14+EGFR  4. Erectile dysfunction, unspecified erectile dysfunction type -  CMP14+EGFR  5. Adjustment disorder with mixed anxiety and depressed mood - CMP14+EGFR  6. Chronic pain syndrome - CMP14+EGFR  7. Vitamin D deficiency - CMP14+EGFR  8. Hyperlipidemia - CMP14+EGFR - Lipid panel  9. Smoker - CMP14+EGFR  10. Morbid obesity with BMI of 40.0-44.9, adult (HCC) - CMP14+EGFR   Continue all meds Labs pending Health Maintenance reviewed Diet and exercise encouraged RTO 4 months  Christy Hawks, FNP     

## 2015-06-08 NOTE — Patient Instructions (Signed)

## 2015-06-11 NOTE — Addendum Note (Signed)
Addended by: Shelbie Ammons on: 06/11/2015 12:12 PM   Modules accepted: Miquel Dunn

## 2015-06-22 ENCOUNTER — Other Ambulatory Visit: Payer: Self-pay | Admitting: Family

## 2015-06-28 ENCOUNTER — Other Ambulatory Visit: Payer: Self-pay | Admitting: Family

## 2015-06-28 DIAGNOSIS — M1990 Unspecified osteoarthritis, unspecified site: Secondary | ICD-10-CM

## 2015-06-29 MED ORDER — HYDROCODONE-ACETAMINOPHEN 7.5-325 MG PO TABS: 1.0000 | ORAL_TABLET | Freq: Three times a day (TID) | ORAL | Status: DC | PRN

## 2015-06-29 NOTE — Telephone Encounter (Signed)
Last filled 05/31/15. Last seen 06/08/15

## 2015-06-29 NOTE — Telephone Encounter (Signed)
Pain medication refilled. This will be last time until patient has a pain contract appt

## 2015-06-29 NOTE — Telephone Encounter (Signed)
Pt notified of RX and that he needs to schedule appt for pain medicine agreement Verbalizes understanding

## 2015-08-03 ENCOUNTER — Telehealth: Payer: Self-pay

## 2015-08-03 ENCOUNTER — Ambulatory Visit (INDEPENDENT_AMBULATORY_CARE_PROVIDER_SITE_OTHER): Payer: Medicare Other | Admitting: Family

## 2015-08-03 ENCOUNTER — Encounter: Payer: Self-pay | Admitting: Family

## 2015-08-03 VITALS — BP 128/77 | HR 61 | Temp 97.3°F | Ht 69.0 in | Wt 247.8 lb

## 2015-08-03 DIAGNOSIS — G8929 Other chronic pain: Secondary | ICD-10-CM | POA: Insufficient documentation

## 2015-08-03 DIAGNOSIS — M1990 Unspecified osteoarthritis, unspecified site: Secondary | ICD-10-CM

## 2015-08-03 DIAGNOSIS — Z79899 Other long term (current) drug therapy: Secondary | ICD-10-CM

## 2015-08-03 DIAGNOSIS — M549 Dorsalgia, unspecified: Secondary | ICD-10-CM

## 2015-08-03 DIAGNOSIS — G500 Trigeminal neuralgia: Secondary | ICD-10-CM | POA: Diagnosis not present

## 2015-08-03 DIAGNOSIS — Z0289 Encounter for other administrative examinations: Secondary | ICD-10-CM | POA: Insufficient documentation

## 2015-08-03 DIAGNOSIS — G894 Chronic pain syndrome: Secondary | ICD-10-CM | POA: Diagnosis not present

## 2015-08-03 DIAGNOSIS — F1120 Opioid dependence, uncomplicated: Secondary | ICD-10-CM | POA: Diagnosis not present

## 2015-08-03 MED ORDER — HYDROCODONE-ACETAMINOPHEN 7.5-325 MG PO TABS: 1.0000 | ORAL_TABLET | Freq: Three times a day (TID) | ORAL | Status: DC | PRN

## 2015-08-03 MED ORDER — SILDENAFIL CITRATE 100 MG PO TABS: 50.0000 mg | ORAL_TABLET | Freq: Every day | ORAL | Status: DC | PRN

## 2015-08-03 MED ORDER — TADALAFIL 20 MG PO TABS: 10.0000 mg | ORAL_TABLET | ORAL | Status: DC | PRN

## 2015-08-03 NOTE — Progress Notes (Signed)
Subjective:    Patient ID: Matthew [name], male    DOB: [birth year], 55 y.o.   MRN: [id number]  HPI [facility] Controlled Substance Abuse database reviewed- Yes  Depression screen [facility] 2/9 08/03/2015 06/08/2015 03/01/2015 02/11/2015 11/24/2014  Decreased Interest 0 0 0 0 0  Down, Depressed, Hopeless 0 0 0 0 0  PHQ - 2 Score 0 0 0 0 0    GAD 7 : Generalized Anxiety Score 08/03/2015  Nervous, Anxious, on Edge 0  Control/stop worrying 0  Worry too much - different things 0  Trouble relaxing 0  Restless 0  Easily annoyed or irritable 0  Afraid - awful might happen 0  Total GAD 7 Score 0       [facility] drug screen performed- Yes  SOAPP  0= never  1= seldom  2=sometimes  3= often  4= very often  How often do you have mood swings? 0 How often do you smoke a cigarette within an hour after waling up? 4 How often have you taken medication other than the way that it was prescribed?0 How often have you used illegal drugs in the past 5 years? 0 How often, in your lifetime, have you had legal problems or been arrested? 0  Score 0  Alcohol Audit - How often during the last year have found that you: 0-Never   1- Less than monthly   2- Monthly     3-Weekly     4-daily or almost daily  - found that you were not able to stop drinking once you started- 0 -failed to do what was normally expected of you because of drinking- 0 -needed a first drink in the morning- 0 -had a feeling of guilt or remorse after drinking- 0 -are/were unable to remember what happened the night before because of your drinking- 0  0- NO   2- yes but not in last year  4- yes during last year -Have you or someone else been injured because of your drinking- 0 - Has anyone been concerned about your drinking or suggested you cut down- 0        TOTAL- 0   ( 0-7- alcohol education, 8-15- simple advice, 16-19 simple advice plus counseling, 20-40 referral for evaluation and treatment 0   Designated Pharm[facility]- [facility]  [place], [place]  Pain assessment: Cause of pain- Chronic back pain, trigeminal neuralgia Pain location- back, face, and joints Pain on scale of 1-10- 8  Frequency- constant What increases pain-walking, and standing What makes pain Better-pain medication and rest  Pain management agreement reviewed and signed- Yes     Review of Systems  Constitutional: Negative.   HENT: Negative.   Respiratory: Negative.   Cardiovascular: Negative.   Gastrointestinal: Negative.   Endocrine: Negative.   Genitourinary: Negative.   Musculoskeletal: Positive for myalgias, back pain and arthralgias.  Neurological: Negative.   Hematological: Negative.   Psychiatric/Behavioral: Negative.   All other systems reviewed and are negative.      Objective:   Physical Exam  Constitutional: He is oriented to person, place, and time. He appears well-developed and well-nourished. No distress.  HENT:  Head: Normocephalic.  Eyes: Pupils are equal, round, and reactive to light. Right eye exhibits no discharge. Left eye exhibits no discharge.  Neck: Normal range of motion. Neck supple. No thyromegaly present.  Cardiovascular: Normal rate, regular rhythm, normal heart sounds and intact distal pulses.   No murmur heard. Pulmonary/Chest: Effort normal and breath sounds normal. No respiratory distress.  He has no wheezes.  Abdominal: Soft. Bowel sounds are normal. He exhibits no distension. There is no tenderness.  Musculoskeletal: Normal range of motion. He exhibits no edema or tenderness.  Neurological: He is alert and oriented to person, place, and time.  Skin: Skin is warm and dry. No rash noted. No erythema.  Psychiatric: He has a normal mood and affect. His behavior is normal. Judgment and thought content normal.  Vitals reviewed.   BP 128/77 mmHg  Pulse 61  Temp(Src) 97.3 F (36.3 C) (Oral)  Ht 5\' 9"  (1.753 m)  Wt 247 lb 12.8 oz (112.401 kg)  BMI 36.58 kg/m2       Assessment & Plan:  1. Chronic  pain syndrome - ToxASSURE Select 13 (MW), Urine - HYDROcodone-acetaminophen (NORCO) 7.5-325 MG tablet; Take 1 tablet by mouth 3 (three) times daily as needed for moderate pain.  Dispense: 90 tablet; Refill: 0 - HYDROcodone-acetaminophen (NORCO) 7.5-325 MG tablet; Take 1 tablet by mouth every 8 (eight) hours as needed for moderate pain.  Dispense: 90 tablet; Refill: 0 - HYDROcodone-acetaminophen (NORCO) 7.5-325 MG tablet; Take 1 tablet by mouth every 8 (eight) hours as needed for moderate pain.  Dispense: 90 tablet; Refill: 0  2. Trigeminal neuralgia - ToxASSURE Select 13 (MW), Urine - HYDROcodone-acetaminophen (NORCO) 7.5-325 MG tablet; Take 1 tablet by mouth 3 (three) times daily as needed for moderate pain.  Dispense: 90 tablet; Refill: 0 - HYDROcodone-acetaminophen (NORCO) 7.5-325 MG tablet; Take 1 tablet by mouth every 8 (eight) hours as needed for moderate pain.  Dispense: 90 tablet; Refill: 0 - HYDROcodone-acetaminophen (NORCO) 7.5-325 MG tablet; Take 1 tablet by mouth every 8 (eight) hours as needed for moderate pain.  Dispense: 90 tablet; Refill: 0  3. Pain medication agreement signed - ToxASSURE Select 13 (MW), Urine - HYDROcodone-acetaminophen (NORCO) 7.5-325 MG tablet; Take 1 tablet by mouth 3 (three) times daily as needed for moderate pain.  Dispense: 90 tablet; Refill: 0 - HYDROcodone-acetaminophen (NORCO) 7.5-325 MG tablet; Take 1 tablet by mouth every 8 (eight) hours as needed for moderate pain.  Dispense: 90 tablet; Refill: 0 - HYDROcodone-acetaminophen (NORCO) 7.5-325 MG tablet; Take 1 tablet by mouth every 8 (eight) hours as needed for moderate pain.  Dispense: 90 tablet; Refill: 0  4. Chronic back pain - ToxASSURE Select 13 (MW), Urine - HYDROcodone-acetaminophen (NORCO) 7.5-325 MG tablet; Take 1 tablet by mouth 3 (three) times daily as needed for moderate pain.  Dispense: 90 tablet; Refill: 0 - HYDROcodone-acetaminophen (NORCO) 7.5-325 MG tablet; Take 1 tablet by mouth every  8 (eight) hours as needed for moderate pain.  Dispense: 90 tablet; Refill: 0 - HYDROcodone-acetaminophen (NORCO) 7.5-325 MG tablet; Take 1 tablet by mouth every 8 (eight) hours as needed for moderate pain.  Dispense: 90 tablet; Refill: 0  5. Uncomplicated opioid dependence ([place]) - ToxASSURE Select 13 (MW), Urine - HYDROcodone-acetaminophen (NORCO) 7.5-325 MG tablet; Take 1 tablet by mouth 3 (three) times daily as needed for moderate pain.  Dispense: 90 tablet; Refill: 0 - HYDROcodone-acetaminophen (NORCO) 7.5-325 MG tablet; Take 1 tablet by mouth every 8 (eight) hours as needed for moderate pain.  Dispense: 90 tablet; Refill: 0 - HYDROcodone-acetaminophen (NORCO) 7.5-325 MG tablet; Take 1 tablet by mouth every 8 (eight) hours as needed for moderate pain.  Dispense: 90 tablet; Refill: 0  6. Arthritis - ToxASSURE Select 13 (MW), Urine - HYDROcodone-acetaminophen (NORCO) 7.5-325 MG tablet; Take 1 tablet by mouth 3 (three) times daily as needed for moderate pain.  Dispense: 90 tablet; Refill:  0 - HYDROcodone-acetaminophen (NORCO) 7.5-325 MG tablet; Take 1 tablet by mouth every 8 (eight) hours as needed for moderate pain.  Dispense: 90 tablet; Refill: 0 - HYDROcodone-acetaminophen (NORCO) 7.5-325 MG tablet; Take 1 tablet by mouth every 8 (eight) hours as needed for moderate pain.  Dispense: 90 tablet; Refill: 0   Continue all meds Labs pending Health Maintenance reviewed Diet and exercise encouraged RTO 3 month  Evelina Dun, FNP

## 2015-08-03 NOTE — Telephone Encounter (Signed)
Medicaid non preferred  Cialis  Can you change to Brand name Viagra?

## 2015-08-03 NOTE — Patient Instructions (Signed)

## 2015-08-09 ENCOUNTER — Encounter: Payer: Self-pay | Admitting: Family

## 2015-08-09 ENCOUNTER — Ambulatory Visit (INDEPENDENT_AMBULATORY_CARE_PROVIDER_SITE_OTHER): Payer: Medicare Other | Admitting: Family

## 2015-08-09 VITALS — BP 138/81 | HR 62 | Temp 96.7°F | Ht 69.0 in | Wt 248.6 lb

## 2015-08-09 DIAGNOSIS — B370 Candidal stomatitis: Secondary | ICD-10-CM

## 2015-08-09 MED ORDER — MAGIC MOUTHWASH W/LIDOCAINE: 5.0000 mL | Freq: Four times a day (QID) | ORAL | Status: DC | PRN

## 2015-08-09 MED ORDER — NYSTATIN 100000 UNIT/ML MT SUSP
5.0000 mL | Freq: Four times a day (QID) | OROMUCOSAL | Status: DC
Start: 2015-08-09 — End: 2016-02-24

## 2015-08-09 NOTE — Patient Instructions (Signed)
Thrush, Adult  Thrush, also called oral candidiasis, is a fungal infection that develops in the mouth and throat and on the tongue. It causes white patches to form on the mouth and tongue. Thrush is most common in older adults, but it can occur at any age.   Many cases of thrush are mild, but this infection can also be more serious. Thrush can be a recurring problem for people who have chronic illnesses or who take medicines that limit the body's ability to fight infection. Because these people have difficulty fighting infections, the fungus that causes thrush can spread throughout the body. This can cause life-threatening blood or organ infections.  CAUSES   Thrush is usually caused by a yeast called Candida albicans. This fungus is normally present in small amounts in the mouth and on other mucous membranes. It usually causes no harm. However, when conditions are present that allow the fungus to grow uncontrolled, it invades surrounding tissues and becomes an infection. Less often, other Candida species can also lead to thrush.   RISK FACTORS  Thrush is more likely to develop in the following people:  · People with an impaired ability to fight infection (weakened immune system).    · Older adults.    · People with HIV.    · People with diabetes.    · People with dry mouth (xerostomia).    · Pregnant women.    · People with poor dental care, especially those who have false teeth.    · People who use antibiotic medicines.    SIGNS AND SYMPTOMS   Thrush can be a mild infection that causes no symptoms. If symptoms develop, they may include:   · A burning feeling in the mouth and throat. This can occur at the start of a thrush infection.    · White patches that adhere to the mouth and tongue. The tissue around the patches may be red, raw, and painful. If rubbed (during tooth brushing, for example), the patches and the tissue of the mouth may bleed easily.    · A bad taste in the mouth or difficulty tasting foods.     · Cottony feeling in the mouth.    · Pain during eating and swallowing.  DIAGNOSIS   Your health care provider can usually diagnose thrush by looking in your mouth and asking you questions about your health.   TREATMENT   Medicines that help prevent the growth of fungi (antifungals) are the standard treatment for thrush. These medicines are either applied directly to the affected area (topical) or swallowed (oral). The treatment will depend on the severity of the condition.   Mild Thrush  Mild cases of thrush may clear up with the use of an antifungal mouth rinse or lozenges. Treatment usually lasts about 14 days.   Moderate to Severe Thrush  · More severe thrush infections that have spread to the esophagus are treated with an oral antifungal medicine. A topical antifungal medicine may also be used.    · For some severe infections, a treatment period longer than 14 days may be needed.    · Oral antifungal medicines are almost never used during pregnancy because the fetus may be harmed. However, if a pregnant woman has a rare, severe thrush infection that has spread to her blood, oral antifungal medicines may be used. In this case, the risk of harm to the mother and fetus from the severe thrush infection may be greater than the risk posed by the use of antifungal medicines.    Persistent or Recurrent Thrush  For cases of   thrush that do not go away or keep coming back, treatment may involve the following:   · Treatment may be needed twice as long as the symptoms last.    · Treatment will include both oral and topical antifungal medicines.    · People with weakened immune systems can take an antifungal medicine on a continuous basis to prevent thrush infections.    It is important to treat conditions that make you more likely to get thrush, such as diabetes or HIV.   HOME CARE INSTRUCTIONS   · Only take over-the-counter or prescription medicine as directed by your health care provider. Talk to your health care  provider about an over-the-counter medicine called gentian violet, which kills bacteria and fungi.    · Eat plain, unflavored yogurt as directed by your health care provider. Check the label to make sure the yogurt contains live cultures. This yogurt can help healthy bacteria grow in the mouth that can stop the growth of the fungus that causes thrush.    · Try these measures to help reduce the discomfort of thrush:      Drink cold liquids such as water or iced tea.      Try flavored ice treats or frozen juices.      Eat foods that are easy to swallow, such as gelatin, ice cream, or custard.      If the patches in your mouth are painful, try drinking from a straw.    · Rinse your mouth several times a day with a warm saltwater rinse. You can make the saltwater mixture with 1 tsp (6 g) of salt in 8 fl oz (0.2 L) of warm water.    · If you wear dentures, remove the dentures before going to bed, brush them vigorously, and soak them in a cleaning solution as directed by your health care provider.    · Women who are breastfeeding should clean their nipples with an antifungal medicine as directed by their health care provider. Dry the nipples after breastfeeding. Applying lanolin-containing body lotion may help relieve nipple soreness.    SEEK MEDICAL CARE IF:  · Your symptoms are getting worse or are not improving within 7 days of starting treatment.    · You have symptoms of spreading infection, such as white patches on the skin outside of the mouth.    · You are nursing and you have redness, burning, or pain in the nipples that is not relieved with treatment.    MAKE SURE YOU:  · Understand these instructions.  · Will watch your condition.  · Will get help right away if you are not doing well or get worse.     This information is not intended to replace advice given to you by your health care provider. Make sure you discuss any questions you have with your health care provider.     Document Released: 10/26/2003 Document  Revised: 02/20/2014 Document Reviewed: 09/02/2012  Elsevier Interactive Patient Education ©2016 [employer]

## 2015-08-09 NOTE — Progress Notes (Signed)
   Subjective:    Patient ID: Matthew [name], male    DOB: 23-Nov-1960, 55 y.o.   MRN: [id number]  HPI PT presents to the office today with gum pain. PT states he noticed the pain several weeks ago, but has become worse. PT states he had all his teeth removed a few months ago. Pt states he is having constant pain 8 out 10 and the pain is worse when he eats. Pt states he has noticed "white spots' on his gums.   Review of Systems  Constitutional: Negative.   HENT: Positive for facial swelling and mouth sores.   Respiratory: Negative.   Cardiovascular: Negative.   Gastrointestinal: Negative.   Endocrine: Negative.   Genitourinary: Negative.   Musculoskeletal: Negative.   Neurological: Negative.   Hematological: Negative.   Psychiatric/Behavioral: Negative.   All other systems reviewed and are negative.      Objective:   Physical Exam  Constitutional: He is oriented to person, place, and time. He appears well-developed and well-nourished. No distress.  HENT:  Head: Normocephalic.  Right Ear: External ear normal.  Left Ear: External ear normal.  Mouth/Throat: Oropharynx is clear and moist.  Tongue coated with a white coating   Eyes: Pupils are equal, round, and reactive to light. Right eye exhibits no discharge. Left eye exhibits no discharge.  Neck: Normal range of motion. Neck supple. No thyromegaly present.  Cardiovascular: Normal rate, regular rhythm, normal heart sounds and intact distal pulses.   No murmur heard. Pulmonary/Chest: Effort normal and breath sounds normal. No respiratory distress. He has no wheezes.  Abdominal: Soft. Bowel sounds are normal. He exhibits no distension. There is no tenderness.  Musculoskeletal: Normal range of motion. He exhibits no edema or tenderness.  Neurological: He is alert and oriented to person, place, and time. He has normal reflexes. No cranial nerve deficit.  Skin: Skin is warm and dry. No rash noted. No erythema.  Psychiatric: He has a  normal mood and affect. His behavior is normal. Judgment and thought content normal.  Vitals reviewed.    BP 138/81 mmHg  Pulse 62  Temp(Src) 96.7 F (35.9 C) (Oral)  Ht 5\' 9"  (1.753 m)  Wt 248 lb 9.6 oz (112.764 kg)  BMI 36.69 kg/m2      Assessment & Plan:  1. Oral thrush -Daily probiotic -Salt water gargles -Smoking cessation discussed -RTo prn - magic mouthwash w/lidocaine SOLN; Take 5 mLs by mouth 4 (four) times daily as needed for mouth pain.  Dispense: 120 mL; Refill: 0 - nystatin (MYCOSTATIN) 100000 UNIT/ML suspension; Take 5 mLs (500,000 Units total) by mouth 4 (four) times daily.  Dispense: 473 mL; Refill: 0  Evelina Dun, FNP

## 2015-08-10 LAB — TOXASSURE SELECT 13 (MW), URINE: PDF: 0

## 2015-08-22 DIAGNOSIS — F17200 Nicotine dependence, unspecified, uncomplicated: Secondary | ICD-10-CM | POA: Diagnosis not present

## 2015-08-22 DIAGNOSIS — I10 Essential (primary) hypertension: Secondary | ICD-10-CM | POA: Diagnosis not present

## 2015-08-22 DIAGNOSIS — J449 Chronic obstructive pulmonary disease, unspecified: Secondary | ICD-10-CM | POA: Diagnosis not present

## 2015-08-22 DIAGNOSIS — Z79899 Other long term (current) drug therapy: Secondary | ICD-10-CM | POA: Diagnosis not present

## 2015-08-22 DIAGNOSIS — W57XXXA Bitten or stung by nonvenomous insect and other nonvenomous arthropods, initial encounter: Secondary | ICD-10-CM | POA: Diagnosis not present

## 2015-08-22 DIAGNOSIS — S30860A Insect bite (nonvenomous) of lower back and pelvis, initial encounter: Secondary | ICD-10-CM | POA: Diagnosis not present

## 2015-09-02 ENCOUNTER — Other Ambulatory Visit: Payer: Self-pay | Admitting: Family

## 2015-09-03 NOTE — Telephone Encounter (Signed)
Tranxene refill called to [facility] voice mail.

## 2015-09-03 NOTE — Telephone Encounter (Signed)
[facility] 08/03/15 -please address

## 2015-10-02 ENCOUNTER — Other Ambulatory Visit: Payer: Self-pay | Admitting: Family

## 2015-10-02 DIAGNOSIS — E785 Hyperlipidemia, unspecified: Secondary | ICD-10-CM

## 2015-10-08 ENCOUNTER — Encounter: Payer: Self-pay | Admitting: Family

## 2015-10-08 ENCOUNTER — Ambulatory Visit (INDEPENDENT_AMBULATORY_CARE_PROVIDER_SITE_OTHER): Payer: Medicare Other | Admitting: Family

## 2015-10-08 VITALS — BP 131/73 | HR 54 | Temp 97.7°F | Ht 68.0 in | Wt 237.4 lb

## 2015-10-08 DIAGNOSIS — F1120 Opioid dependence, uncomplicated: Secondary | ICD-10-CM | POA: Diagnosis not present

## 2015-10-08 DIAGNOSIS — F17200 Nicotine dependence, unspecified, uncomplicated: Secondary | ICD-10-CM

## 2015-10-08 DIAGNOSIS — I10 Essential (primary) hypertension: Secondary | ICD-10-CM | POA: Diagnosis not present

## 2015-10-08 DIAGNOSIS — G500 Trigeminal neuralgia: Secondary | ICD-10-CM | POA: Diagnosis not present

## 2015-10-08 DIAGNOSIS — Z79899 Other long term (current) drug therapy: Secondary | ICD-10-CM | POA: Diagnosis not present

## 2015-10-08 DIAGNOSIS — G894 Chronic pain syndrome: Secondary | ICD-10-CM | POA: Diagnosis not present

## 2015-10-08 DIAGNOSIS — Z720 Tobacco use: Secondary | ICD-10-CM

## 2015-10-08 DIAGNOSIS — E559 Vitamin D deficiency, unspecified: Secondary | ICD-10-CM

## 2015-10-08 DIAGNOSIS — F4323 Adjustment disorder with mixed anxiety and depressed mood: Secondary | ICD-10-CM | POA: Diagnosis not present

## 2015-10-08 DIAGNOSIS — M549 Dorsalgia, unspecified: Secondary | ICD-10-CM | POA: Diagnosis not present

## 2015-10-08 DIAGNOSIS — E785 Hyperlipidemia, unspecified: Secondary | ICD-10-CM | POA: Diagnosis not present

## 2015-10-08 DIAGNOSIS — G8929 Other chronic pain: Secondary | ICD-10-CM

## 2015-10-08 DIAGNOSIS — Z6841 Body Mass Index (BMI) 40.0 and over, adult: Secondary | ICD-10-CM

## 2015-10-08 DIAGNOSIS — J42 Unspecified chronic bronchitis: Secondary | ICD-10-CM | POA: Diagnosis not present

## 2015-10-08 DIAGNOSIS — M1990 Unspecified osteoarthritis, unspecified site: Secondary | ICD-10-CM

## 2015-10-08 DIAGNOSIS — Z0289 Encounter for other administrative examinations: Secondary | ICD-10-CM

## 2015-10-08 MED ORDER — SULFAMETHOXAZOLE-TRIMETHOPRIM 800-160 MG PO TABS: 1.0000 | ORAL_TABLET | Freq: Two times a day (BID) | ORAL | 0 refills | Status: DC

## 2015-10-08 MED ORDER — HYDROCODONE-ACETAMINOPHEN 7.5-325 MG PO TABS: 1.0000 | ORAL_TABLET | Freq: Three times a day (TID) | ORAL | 0 refills | Status: DC | PRN

## 2015-10-08 MED ORDER — FLUTICASONE FUROATE-VILANTEROL 100-25 MCG/INH IN AEPB: 1.0000 | INHALATION_SPRAY | Freq: Every day | RESPIRATORY_TRACT | 0 refills | Status: DC

## 2015-10-08 MED ORDER — CARBAMAZEPINE 200 MG PO TABS: ORAL_TABLET | ORAL | 3 refills | Status: DC

## 2015-10-08 NOTE — Progress Notes (Signed)
Subjective:    Patient ID: Matthew [name], male    DOB: 1960-09-13, 55 y.o.   MRN: [id number]  PT presents to the office today for chronic follow up. He had a recent dental extraction with 5 teeth pulled on the lower jaw 3 days ago.   Hypertension  This is a chronic problem. The current episode started more than 1 year ago. The problem has been resolved since onset. The problem is controlled. Pertinent negatives include no anxiety, blurred vision, headaches, palpitations, peripheral edema or shortness of breath. Risk factors for coronary artery disease include dyslipidemia, family history, male gender, sedentary lifestyle, obesity and smoking/tobacco exposure. Past treatments include calcium channel blockers and diuretics. The current treatment provides moderate improvement. There is no history of angina, kidney disease, CAD/MI, CVA, heart failure or a thyroid problem. There is no history of sleep apnea.  Hyperlipidemia  This is a chronic problem. The current episode started more than 1 year ago. The problem is controlled. Recent lipid tests were reviewed and are normal. Exacerbating diseases include obesity. He has no history of diabetes. Factors aggravating his hyperlipidemia include smoking. Pertinent negatives include no shortness of breath. Current antihyperlipidemic treatment includes statins. The current treatment provides moderate improvement of lipids. Risk factors for coronary artery disease include dyslipidemia, hypertension, male sex, a sedentary lifestyle and family history.  Medication Refill  Pertinent negatives include no coughing, headaches, joint swelling or weakness.  Anxiety  Presents for follow-up visit. Onset was 1 to 5 years ago. The problem has been waxing and waning. Symptoms include impotence. Patient reports no decreased concentration, depressed mood, excessive worry, insomnia, nervous/anxious behavior, palpitations, restlessness or shortness of breath. Symptoms occur  occasionally. The severity of symptoms is mild. The quality of sleep is good. Nighttime awakenings: none.   There is no history of anxiety/panic attacks or depression. Past treatments include non-benzodiazephine anxiolytics (Tranxene). The treatment provided significant relief. Compliance with prior treatments has been good. Compliance with medications is 76-100%.  Arthritis  Presents for follow-up visit. The disease course has been worsening. He complains of pain and joint warmth. He reports no joint swelling. Affected locations include the right hip, left hip and right knee. His pain is at a severity of 7/10. Associated symptoms include pain while resting. His past medical history is significant for chronic back pain and osteoarthritis. Past treatments include activity, NSAIDs and rest. The treatment provided moderate relief.  COPD PT prescribed Breo a"every blue moon". Discussed importance of using.  Pt reports smoking at least a pack of day.    Review of Systems  Constitutional: Negative.   HENT: Positive for dental problem. Negative for ear pain and tinnitus.   Eyes: Negative.  Negative for blurred vision.  Respiratory: Positive for wheezing. Negative for cough, chest tightness and shortness of breath.   Cardiovascular: Negative.  Negative for palpitations.  Gastrointestinal: Negative.   Endocrine: Negative.   Genitourinary: Positive for impotence.  Musculoskeletal: Positive for arthritis. Negative for joint swelling.  Skin: Negative.   Allergic/Immunologic: Negative.   Neurological: Negative.  Negative for weakness and headaches.  Hematological: Negative.   Psychiatric/Behavioral: Negative for decreased concentration. The patient is not nervous/anxious and does not have insomnia.   All other systems reviewed and are negative.      Objective:   Physical Exam  Constitutional: He is oriented to person, place, and time. Vital signs are normal. He appears well-developed and  well-nourished. No distress.  HENT:  Head: Normocephalic.  Right Ear: Tympanic membrane and  external ear normal.  Left Ear: Tympanic membrane and external ear normal.  Nose: Nose normal.  Mouth/Throat: Oropharynx is clear and moist.    Eyes: Pupils are equal, round, and reactive to light. Right eye exhibits no discharge. Left eye exhibits no discharge.  Neck: Normal range of motion. Neck supple. Carotid bruit is not present. No thyroid mass and no thyromegaly present.  Cardiovascular: Normal rate, regular rhythm, normal heart sounds, intact distal pulses and normal pulses.  PMI is not displaced.   No murmur heard. Pulmonary/Chest: Effort normal. No respiratory distress. He has wheezes in the right lower field and the left lower field.  Abdominal: Soft. Normal appearance and bowel sounds are normal. He exhibits no distension. There is no tenderness.  Musculoskeletal: Normal range of motion. He exhibits no edema or tenderness.  Lymphadenopathy:       Head (right side): No submental and no tonsillar adenopathy present.       Head (left side): No submental and no tonsillar adenopathy present.    He has no cervical adenopathy.  Neurological: He is alert and oriented to person, place, and time. He has normal strength and normal reflexes. No cranial nerve deficit or sensory deficit.  Skin: Skin is warm, dry and intact. No rash noted. No erythema.  Psychiatric: He has a normal mood and affect. His speech is normal and behavior is normal. Judgment and thought content normal. His mood appears not anxious. Cognition and memory are normal.  Vitals reviewed.     BP 131/73   Pulse (!) 54   Temp 97.7 F (36.5 C) (Oral)   Ht 5' 8" (1.727 m)   Wt 237 lb 6.4 oz (107.7 kg)   BMI 36.10 kg/m      Assessment & Plan:  1. Essential hypertension - CMP14+EGFR  2. Chronic bronchitis, unspecified chronic bronchitis type (HCC) - fluticasone furoate-vilanterol (BREO ELLIPTA) 100-25 MCG/INH AEPB; Inhale  1 puff into the lungs daily.  Dispense: 180 each; Refill: 0 - CMP14+EGFR  3. Trigeminal neuralgia - carbamazepine (TEGRETOL) 200 MG tablet; TAKE 2 TABLETS BY MOUTH 4 TIMES A DAY  Dispense: 720 tablet; Refill: 3 - HYDROcodone-acetaminophen (NORCO) 7.5-325 MG tablet; Take 1 tablet by mouth 3 (three) times daily as needed for moderate pain.  Dispense: 90 tablet; Refill: 0 - CMP14+EGFR  4. Adjustment disorder with mixed anxiety and depressed mood - CMP14+EGFR  5. Chronic back pain - HYDROcodone-acetaminophen (NORCO) 7.5-325 MG tablet; Take 1 tablet by mouth 3 (three) times daily as needed for moderate pain.  Dispense: 90 tablet; Refill: 0 - CMP14+EGFR  6. Chronic pain syndrome - HYDROcodone-acetaminophen (NORCO) 7.5-325 MG tablet; Take 1 tablet by mouth 3 (three) times daily as needed for moderate pain.  Dispense: 90 tablet; Refill: 0 - CMP14+EGFR  7. Vitamin D deficiency - CMP14+EGFR  8. Smoker - CMP14+EGFR  9. Pain medication agreement signed - HYDROcodone-acetaminophen (NORCO) 7.5-325 MG tablet; Take 1 tablet by mouth 3 (three) times daily as needed for moderate pain.  Dispense: 90 tablet; Refill: 0 - CMP14+EGFR - ToxASSURE Select 13 (MW), Urine  10. Uncomplicated opioid dependence ([place]) - HYDROcodone-acetaminophen (NORCO) 7.5-325 MG tablet; Take 1 tablet by mouth 3 (three) times daily as needed for moderate pain.  Dispense: 90 tablet; Refill: 0 - CMP14+EGFR - ToxASSURE Select 13 (MW), Urine  11. Morbid obesity with BMI of 40.0-44.9, adult ([place]) - CMP14+EGFR  12. Hyperlipidemia - CMP14+EGFR - Lipid panel  13. Arthritis - HYDROcodone-acetaminophen (NORCO) 7.5-325 MG tablet; Take 1 tablet by mouth 3 (  three) times daily as needed for moderate pain.  Dispense: 90 tablet; Refill: 0 - CMP14+EGFR  [place] database reviewed- Only pain medication given by me. Last drug screen abnormal. Only one month given today and repeat drug screen today.  Continue all meds Labs pending Health  Maintenance reviewed Diet and exercise encouraged RTO 3 months  Evelina Dun, FNP

## 2015-10-08 NOTE — Patient Instructions (Signed)

## 2015-10-09 LAB — LIPID PANEL
CHOL/HDL RATIO: 3.3 ratio (ref 0.0–5.0)
Cholesterol, Total: 163 mg/dL (ref 100–199)
HDL: 49 mg/dL (ref >39)
LDL Calculated: 86 mg/dL (ref 0–99)
Triglycerides: 139 mg/dL (ref 0–149)
VLDL Cholesterol Cal: 28 mg/dL (ref 5–40)

## 2015-10-09 LAB — CMP14+EGFR
A/G RATIO: 1.6 (ref 1.2–2.2)
ALT: 17 IU/L (ref 0–44)
AST: 14 IU/L (ref 0–40)
Albumin: 4.1 g/dL (ref 3.5–5.5)
Alkaline Phosphatase: 111 IU/L (ref 39–117)
BILIRUBIN TOTAL: 0.3 mg/dL (ref 0.0–1.2)
BUN/Creatinine Ratio: 10 (ref 9–20)
BUN: 6 mg/dL (ref 6–24)
CALCIUM: 9 mg/dL (ref 8.7–10.2)
CHLORIDE: 91 mmol/L — AB (ref 96–106)
CO2: 26 mmol/L (ref 18–29)
Creatinine, Ser: 0.6 mg/dL — ABNORMAL LOW (ref 0.76–1.27)
GFR, EST AFRICAN AMERICAN: 130 mL/min/{1.73_m2} (ref >59)
GFR, EST NON AFRICAN AMERICAN: 112 mL/min/{1.73_m2} (ref >59)
GLOBULIN, TOTAL: 2.6 g/dL (ref 1.5–4.5)
Glucose: 104 mg/dL — ABNORMAL HIGH (ref 65–99)
POTASSIUM: 4.3 mmol/L (ref 3.5–5.2)
SODIUM: 134 mmol/L (ref 134–144)
TOTAL PROTEIN: 6.7 g/dL (ref 6.0–8.5)

## 2015-10-17 ENCOUNTER — Other Ambulatory Visit: Payer: Self-pay | Admitting: Family

## 2015-10-18 LAB — TOXASSURE SELECT 13 (MW), URINE: PDF: 0

## 2015-10-21 ENCOUNTER — Encounter: Payer: Self-pay | Admitting: *Deleted

## 2015-10-21 ENCOUNTER — Encounter: Payer: Self-pay | Admitting: Family

## 2015-10-21 ENCOUNTER — Ambulatory Visit (INDEPENDENT_AMBULATORY_CARE_PROVIDER_SITE_OTHER): Payer: Medicare Other | Admitting: Family

## 2015-10-21 VITALS — BP 134/78 | HR 54 | Temp 96.8°F | Ht 68.0 in | Wt 231.0 lb

## 2015-10-21 DIAGNOSIS — J309 Allergic rhinitis, unspecified: Secondary | ICD-10-CM

## 2015-10-21 DIAGNOSIS — H9313 Tinnitus, bilateral: Secondary | ICD-10-CM | POA: Diagnosis not present

## 2015-10-21 MED ORDER — FLUTICASONE PROPIONATE 50 MCG/ACT NA SUSP: 2.0000 | Freq: Every day | NASAL | 6 refills | Status: DC

## 2015-10-21 NOTE — Progress Notes (Signed)
   Subjective:    Patient ID: Matthew [name], male    DOB: February 20, 1960, 55 y.o.   MRN: [id number]  HPI PT presents to the office today  With recurrent bilateral "ringing of ears". PT states this started a few "weeks ago". Pt states this happened a few years ago and he put "ear drops in that helped". Pt states the "ringing" is constant.    Review of Systems  Constitutional: Negative.   HENT: Positive for tinnitus.   Respiratory: Negative.   Cardiovascular: Negative.   Gastrointestinal: Negative.   Endocrine: Negative.   Genitourinary: Negative.   Musculoskeletal: Negative.   Neurological: Negative.   Hematological: Negative.   Psychiatric/Behavioral: Negative.   All other systems reviewed and are negative.      Objective:   Physical Exam  Constitutional: He is oriented to person, place, and time. He appears well-developed and well-nourished. No distress.  HENT:  Head: Normocephalic.  Right Ear: External ear normal.  Left Ear: External ear normal.  Nose: Mucosal edema and rhinorrhea present.  Mouth/Throat: Oropharynx is clear and moist.  Eyes: Pupils are equal, round, and reactive to light. Right eye exhibits no discharge. Left eye exhibits no discharge.  Neck: Normal range of motion. Neck supple. No thyromegaly present.  Cardiovascular: Normal rate, regular rhythm, normal heart sounds and intact distal pulses.   No murmur heard. Pulmonary/Chest: Effort normal and breath sounds normal. No respiratory distress. He has no wheezes.  Abdominal: Soft. Bowel sounds are normal. He exhibits no distension. There is no tenderness.  Musculoskeletal: Normal range of motion. He exhibits no edema or tenderness.  Neurological: He is alert and oriented to person, place, and time. He has normal reflexes. No cranial nerve deficit.  Skin: Skin is warm and dry. No rash noted. No erythema.  Psychiatric: He has a normal mood and affect. His behavior is normal. Judgment and thought content normal.    Vitals reviewed.   BP 134/78   Pulse (!) 54   Temp (!) 96.8 F (36 C) (Oral)   Ht 5\' 8"  (1.727 m)   Wt 231 lb (104.8 kg)   BMI 35.12 kg/m        Assessment & Plan:  1. Tinnitus, bilateral -Avoid alcohol and caffeine -Avoid loud noise -RTO prn - fluticasone (FLONASE) 50 MCG/ACT nasal spray; Place 2 sprays into both nostrils daily.  Dispense: 16 g; Refill: 6  2. Allergic rhinitis, unspecified allergic rhinitis type -Avoid allergens when possible  - fluticasone (FLONASE) 50 MCG/ACT nasal spray; Place 2 sprays into both nostrils daily.  Dispense: 16 g; Refill: [place], FNP

## 2015-10-21 NOTE — Patient Instructions (Signed)
Tinnitus  Tinnitus refers to hearing a sound when there is no actual source for that sound. This is often described as ringing in the ears. However, people with this condition may hear a variety of noises. A person may hear the sound in one ear or in both ears.   The sounds of tinnitus can be soft, loud, or somewhere in between. Tinnitus can last for a few seconds or can be constant for days. It may go away without treatment and come back at various times. When tinnitus is constant or happens often, it can lead to other problems, such as trouble sleeping and trouble concentrating.  Almost everyone experiences tinnitus at some point. Tinnitus that is long-lasting (chronic) or comes back often is a problem that may require medical attention.   CAUSES   The cause of tinnitus is often not known. In some cases, it can result from other problems or conditions, including:   · Exposure to loud noises from machinery, music, or other sources.  · Hearing loss.  · Ear or sinus infections.  · Earwax buildup.  · A foreign object in the ear.  · Use of certain medicines.  · Use of alcohol and caffeine.  · High blood pressure.  · Heart diseases.  · Anemia.  · Allergies.  · Meniere disease.  · Thyroid problems.  · Tumors.  · An enlarged part of a weakened blood vessel (aneurysm).  SYMPTOMS  The main symptom of tinnitus is hearing a sound when there is no source for that sound. It may sound like:   · Buzzing.  · Roaring.  · Ringing.  · Blowing air, similar to the sound heard when you listen to a seashell.  · Hissing.  · Whistling.  · Sizzling.  · Humming.  · Running water.  · A sustained musical note.  DIAGNOSIS   Tinnitus is diagnosed based on your symptoms. Your health care provider will do a physical exam. A comprehensive hearing exam (audiologic exam) will be done if your tinnitus:   · Affects only one ear (unilateral).  · Causes hearing difficulties.  · Lasts 6 months or longer.  You may also need to see a health care provider  who specializes in hearing disorders (audiologist). You may be asked to complete a questionnaire to determine the severity of your tinnitus. Tests may be done to help determine the cause and to rule out other conditions. These can include:  · Imaging studies of your head and brain, such as:    A CT scan.    An MRI.  · An imaging study of your blood vessels (angiogram).  TREATMENT   Treating an underlying medical condition can sometimes make tinnitus go away. If your tinnitus continues, other treatments may include:  · Medicines, such as certain antidepressants or sleeping aids.  · Sound generators to mask the tinnitus. These include:  ¨ Tabletop sound machines that play relaxing sounds to help you fall asleep.  ¨ Wearable devices that fit in your ear and play sounds or music.  ¨ A small device that uses headphones to deliver a signal embedded in music (acoustic neural stimulation). In time, this may change the pathways of your brain and make you less sensitive to tinnitus. This device is used for very severe cases when no other treatment is working.  · Therapy and counseling to help you manage the stress of living with tinnitus.  · Using hearing aids or cochlear implants, if your tinnitus is related to hearing   loss.  HOME CARE INSTRUCTIONS  · When possible, avoid being in loud places and being exposed to loud sounds.  · Wear hearing protection, such as earplugs, when you are exposed to loud noises.  · Do not take stimulants, such as nicotine, alcohol, or caffeine.  · Practice techniques for reducing stress, such as meditation, yoga, or deep breathing.  · Use a white noise machine, a humidifier, or other devices to mask the sound of tinnitus.  · Sleep with your head slightly raised. This may reduce the impact of tinnitus.  · Try to get plenty of rest each night.  SEEK MEDICAL CARE IF:  · You have tinnitus in just one ear.  · Your tinnitus continues for 3 weeks or longer without stopping.  · Home care measures are not  helping.  · You have tinnitus after a head injury.  · You have tinnitus along with any of the following:    Dizziness.    Loss of balance.    Nausea and vomiting.     This information is not intended to replace advice given to you by your health care provider. Make sure you discuss any questions you have with your health care provider.     Document Released: 01/30/2005 Document Revised: 02/20/2014 Document Reviewed: 07/02/2013  Elsevier Interactive Patient Education ©2016 [employer]

## 2015-10-30 ENCOUNTER — Other Ambulatory Visit: Payer: Self-pay | Admitting: *Deleted

## 2015-10-30 ENCOUNTER — Other Ambulatory Visit: Payer: Self-pay | Admitting: Pediatrics

## 2015-10-30 DIAGNOSIS — G894 Chronic pain syndrome: Secondary | ICD-10-CM

## 2015-10-30 DIAGNOSIS — G500 Trigeminal neuralgia: Secondary | ICD-10-CM

## 2015-10-30 DIAGNOSIS — M549 Dorsalgia, unspecified: Secondary | ICD-10-CM

## 2015-10-30 DIAGNOSIS — Z0289 Encounter for other administrative examinations: Secondary | ICD-10-CM

## 2015-10-30 DIAGNOSIS — F1120 Opioid dependence, uncomplicated: Secondary | ICD-10-CM

## 2015-10-30 DIAGNOSIS — M1990 Unspecified osteoarthritis, unspecified site: Secondary | ICD-10-CM

## 2015-10-30 DIAGNOSIS — G8929 Other chronic pain: Secondary | ICD-10-CM

## 2015-10-30 MED ORDER — HYDROCODONE-ACETAMINOPHEN 7.5-325 MG PO TABS: 1.0000 | ORAL_TABLET | Freq: Three times a day (TID) | ORAL | 0 refills | Status: DC | PRN

## 2015-10-30 NOTE — Telephone Encounter (Signed)
Patient is due for refill on Norco. His last Rx was filled on 10/02/15. He was given a prescription on 10/08/15 and remembers taking it to the pharmacy to hold until today. They do not have it on file and they checked the [place] Registry and he hasn't filled it anywhere else. Pharmacist reports that they normally do not have any problems with Mr Matthew [name].   Discussed with Dr Evette Doffing and she was willing to give him 9 tablets to last until his record can be reviewed by Evelina Dun, FNP on Monday. Script was printed but not picked up. Pt stated that he would be ok to wait until Monday. I will forward to [facility] for review and see if she is willing to write another prescription.

## 2015-10-30 NOTE — Progress Notes (Signed)
Pt called to say he dropped Rx off at the pharmacy on 8/25 when it was givne to him. Pharmacy says they do not have it Not sure if it was dropped off or not Was not due for refill until 10/31/2015 so Rx not filled at that time of drop off Pharmacy and pt calling because he will run out tomorrow. He has signed pain management agreement with this office. Wrote Rx for #9 tabs to cover until PCP is back in office. Pt aware that even then per pain medication agreement that he has signed may not get additional Rx for lost scripts. Controlled substance database reviewed and was appropriate.

## 2015-11-01 MED ORDER — HYDROCODONE-ACETAMINOPHEN 7.5-325 MG PO TABS: 1.0000 | ORAL_TABLET | Freq: Three times a day (TID) | ORAL | 0 refills | Status: DC | PRN

## 2015-11-01 NOTE — Telephone Encounter (Signed)
Patient aware that Rx is ready for pick up 

## 2015-11-01 NOTE — Telephone Encounter (Signed)
RX ready for pick up 

## 2015-11-30 ENCOUNTER — Telehealth: Payer: Self-pay | Admitting: Family

## 2015-11-30 NOTE — Telephone Encounter (Signed)
Pt needs appt

## 2015-11-30 NOTE — Telephone Encounter (Signed)
Patient aware that he will need to be seen for rx for pain meds

## 2015-12-02 ENCOUNTER — Ambulatory Visit (INDEPENDENT_AMBULATORY_CARE_PROVIDER_SITE_OTHER): Payer: Medicare Other | Admitting: Family

## 2015-12-02 ENCOUNTER — Encounter: Payer: Self-pay | Admitting: Family

## 2015-12-02 VITALS — BP 114/73 | HR 80 | Temp 97.4°F | Ht 68.0 in | Wt 232.2 lb

## 2015-12-02 DIAGNOSIS — Z6841 Body Mass Index (BMI) 40.0 and over, adult: Secondary | ICD-10-CM

## 2015-12-02 DIAGNOSIS — Z0289 Encounter for other administrative examinations: Secondary | ICD-10-CM

## 2015-12-02 DIAGNOSIS — Z23 Encounter for immunization: Secondary | ICD-10-CM

## 2015-12-02 DIAGNOSIS — E782 Mixed hyperlipidemia: Secondary | ICD-10-CM

## 2015-12-02 DIAGNOSIS — R8290 Unspecified abnormal findings in urine: Secondary | ICD-10-CM

## 2015-12-02 DIAGNOSIS — G894 Chronic pain syndrome: Secondary | ICD-10-CM

## 2015-12-02 DIAGNOSIS — M1990 Unspecified osteoarthritis, unspecified site: Secondary | ICD-10-CM | POA: Diagnosis not present

## 2015-12-02 DIAGNOSIS — E559 Vitamin D deficiency, unspecified: Secondary | ICD-10-CM

## 2015-12-02 DIAGNOSIS — M545 Low back pain, unspecified: Secondary | ICD-10-CM

## 2015-12-02 DIAGNOSIS — F1120 Opioid dependence, uncomplicated: Secondary | ICD-10-CM

## 2015-12-02 DIAGNOSIS — I10 Essential (primary) hypertension: Secondary | ICD-10-CM | POA: Diagnosis not present

## 2015-12-02 DIAGNOSIS — F4323 Adjustment disorder with mixed anxiety and depressed mood: Secondary | ICD-10-CM

## 2015-12-02 DIAGNOSIS — G500 Trigeminal neuralgia: Secondary | ICD-10-CM | POA: Diagnosis not present

## 2015-12-02 DIAGNOSIS — F17200 Nicotine dependence, unspecified, uncomplicated: Secondary | ICD-10-CM | POA: Diagnosis not present

## 2015-12-02 DIAGNOSIS — J42 Unspecified chronic bronchitis: Secondary | ICD-10-CM | POA: Diagnosis not present

## 2015-12-02 DIAGNOSIS — G8929 Other chronic pain: Secondary | ICD-10-CM

## 2015-12-02 LAB — URINALYSIS, COMPLETE
Bilirubin, UA: NEGATIVE
GLUCOSE, UA: NEGATIVE
KETONES UA: NEGATIVE
Leukocytes, UA: NEGATIVE
NITRITE UA: NEGATIVE
Protein, UA: NEGATIVE
RBC, UA: NEGATIVE
SPEC GRAV UA: 1.015 (ref 1.005–1.030)
UUROB: 0.2 mg/dL (ref 0.2–1.0)
pH, UA: 7 (ref 5.0–7.5)

## 2015-12-02 LAB — MICROSCOPIC EXAMINATION
Bacteria, UA: NONE SEEN
RBC MICROSCOPIC, UA: NONE SEEN /HPF (ref 0–?)

## 2015-12-02 MED ORDER — HYDROCODONE-ACETAMINOPHEN 7.5-325 MG PO TABS: 1.0000 | ORAL_TABLET | Freq: Three times a day (TID) | ORAL | 0 refills | Status: DC | PRN

## 2015-12-02 MED ORDER — CLORAZEPATE DIPOTASSIUM 7.5 MG PO TABS: 7.5000 mg | ORAL_TABLET | Freq: Three times a day (TID) | ORAL | 2 refills | Status: DC | PRN

## 2015-12-02 NOTE — Patient Instructions (Signed)

## 2015-12-02 NOTE — Progress Notes (Addendum)
Subjective:    Patient ID: Matthew [name], male    DOB: June 08, 1960, 55 y.o.   MRN: [id number]  PT presents to the office today for chronic follow up. PT complaining of urinary frequency and a foul smell. Pt states this has been going on for a few weeks and is unchanged.  Medication Refill  Pertinent negatives include no coughing, headaches, joint swelling or weakness.  Hypertension  This is a chronic problem. The current episode started more than 1 year ago. The problem has been resolved since onset. The problem is controlled. Pertinent negatives include no anxiety, blurred vision, headaches, palpitations, peripheral edema or shortness of breath. Risk factors for coronary artery disease include dyslipidemia, family history, male gender, sedentary lifestyle, obesity and smoking/tobacco exposure. Past treatments include calcium channel blockers and diuretics. The current treatment provides moderate improvement. There is no history of angina, kidney disease, CAD/MI, CVA, heart failure or a thyroid problem. There is no history of sleep apnea.  Hyperlipidemia  This is a chronic problem. The current episode started more than 1 year ago. The problem is controlled. Recent lipid tests were reviewed and are normal. Exacerbating diseases include obesity. He has no history of diabetes. Factors aggravating his hyperlipidemia include smoking. Pertinent negatives include no shortness of breath. Current antihyperlipidemic treatment includes statins. The current treatment provides moderate improvement of lipids. Risk factors for coronary artery disease include dyslipidemia, hypertension, male sex, a sedentary lifestyle and family history.  Anxiety  Presents for follow-up visit. Onset was 1 to 5 years ago. The problem has been waxing and waning. Symptoms include impotence. Patient reports no decreased concentration, depressed mood, excessive worry, insomnia, nervous/anxious behavior, palpitations, restlessness or shortness  of breath. Symptoms occur occasionally. The severity of symptoms is mild. The quality of sleep is good. Nighttime awakenings: none.   There is no history of anxiety/panic attacks or depression. Past treatments include non-benzodiazephine anxiolytics (Tranxene). The treatment provided significant relief. Compliance with prior treatments has been good. Compliance with medications is 76-100%.  Arthritis  Presents for follow-up visit. The disease course has been worsening. He complains of pain and joint warmth. He reports no joint swelling. Affected locations include the right hip, left hip and right knee. His pain is at a severity of 7/10. Associated symptoms include pain while resting. His past medical history is significant for chronic back pain and osteoarthritis. Past treatments include activity, NSAIDs and rest. The treatment provided moderate relief.  Back Pain  This is a chronic problem. The current episode started more than 1 year ago. The problem occurs constantly. The problem is unchanged. The pain is present in the lumbar spine. The quality of the pain is described as aching. The symptoms are aggravated by bending. Pertinent negatives include no headaches or weakness.  COPD PT prescribed Breo daily. Pt reports smoking at least a pack of day.    Review of Systems  Constitutional: Negative.   HENT: Positive for dental problem. Negative for ear pain and tinnitus.   Eyes: Negative.  Negative for blurred vision.  Respiratory: Positive for wheezing. Negative for cough, chest tightness and shortness of breath.   Cardiovascular: Negative.  Negative for palpitations.  Gastrointestinal: Negative.   Endocrine: Negative.   Genitourinary: Positive for impotence.  Musculoskeletal: Positive for arthritis and back pain. Negative for joint swelling.  Skin: Negative.   Allergic/Immunologic: Negative.   Neurological: Negative.  Negative for weakness and headaches.  Hematological: Negative.     Psychiatric/Behavioral: Negative for decreased concentration. The patient is  not nervous/anxious and does not have insomnia.   All other systems reviewed and are negative.      Objective:   Physical Exam  Constitutional: He is oriented to person, place, and time. Vital signs are normal. He appears well-developed and well-nourished. No distress.  HENT:  Head: Normocephalic.  Right Ear: Tympanic membrane and external ear normal.  Left Ear: Tympanic membrane and external ear normal.  Nose: Nose normal.  Mouth/Throat: Oropharynx is clear and moist.    Eyes: Pupils are equal, round, and reactive to light. Right eye exhibits no discharge. Left eye exhibits no discharge.  Neck: Normal range of motion. Neck supple. Carotid bruit is not present. No thyroid mass and no thyromegaly present.  Cardiovascular: Normal rate, regular rhythm, normal heart sounds, intact distal pulses and normal pulses.  PMI is not displaced.   No murmur heard. Pulmonary/Chest: Effort normal. No respiratory distress. He has wheezes in the right lower field and the left lower field.  Abdominal: Soft. Normal appearance and bowel sounds are normal. He exhibits no distension. There is no tenderness.  Musculoskeletal: Normal range of motion. He exhibits no edema or tenderness.  Lymphadenopathy:       Head (right side): No submental and no tonsillar adenopathy present.       Head (left side): No submental and no tonsillar adenopathy present.    He has no cervical adenopathy.  Neurological: He is alert and oriented to person, place, and time. He has normal strength and normal reflexes. No cranial nerve deficit or sensory deficit.  Skin: Skin is warm, dry and intact. No rash noted. No erythema.  Psychiatric: He has a normal mood and affect. His speech is normal and behavior is normal. Judgment and thought content normal. His mood appears not anxious. Cognition and memory are normal.  Vitals reviewed.     BP 114/73   Pulse  80   Temp 97.4 F (36.3 C) (Oral)   Ht '5\' 8"'$  (1.727 m)   Wt 232 lb 3.2 oz (105.3 kg)   BMI 35.31 kg/m      Assessment & Plan:  1. Essential hypertension - CMP14+EGFR  2. Chronic bronchitis, unspecified chronic bronchitis type (HCC) - CMP14+EGFR  3. Vitamin D deficiency - CMP14+EGFR  4. Smoker - CMP14+EGFR  5. Pain medication agreement signed - CMP14+EGFR - ToxASSURE Select 13 (MW), Urine - HYDROcodone-acetaminophen (NORCO) 7.5-325 MG tablet; Take 1 tablet by mouth every 8 (eight) hours as needed for moderate pain.  Dispense: 90 tablet; Refill: 0 - HYDROcodone-acetaminophen (NORCO) 7.5-325 MG tablet; Take 1 tablet by mouth every 8 (eight) hours as needed for moderate pain.  Dispense: 90 tablet; Refill: 0 - HYDROcodone-acetaminophen (NORCO) 7.5-325 MG tablet; Take 1 tablet by mouth 3 (three) times daily as needed for moderate pain.  Dispense: 90 tablet; Refill: 0  6. Uncomplicated opioid dependence ([place]) - CMP14+EGFR - ToxASSURE Select 13 (MW), Urine - HYDROcodone-acetaminophen (NORCO) 7.5-325 MG tablet; Take 1 tablet by mouth every 8 (eight) hours as needed for moderate pain.  Dispense: 90 tablet; Refill: 0 - HYDROcodone-acetaminophen (NORCO) 7.5-325 MG tablet; Take 1 tablet by mouth every 8 (eight) hours as needed for moderate pain.  Dispense: 90 tablet; Refill: 0 - HYDROcodone-acetaminophen (NORCO) 7.5-325 MG tablet; Take 1 tablet by mouth 3 (three) times daily as needed for moderate pain.  Dispense: 90 tablet; Refill: 0  7. Morbid obesity with BMI of 40.0-44.9, adult (HCC) - CMP14+EGFR  8. Mixed hyperlipidemia - CMP14+EGFR  9. Chronic pain syndrome - CMP14+EGFR -  ToxASSURE Select 13 (MW), Urine - HYDROcodone-acetaminophen (NORCO) 7.5-325 MG tablet; Take 1 tablet by mouth every 8 (eight) hours as needed for moderate pain.  Dispense: 90 tablet; Refill: 0 - HYDROcodone-acetaminophen (NORCO) 7.5-325 MG tablet; Take 1 tablet by mouth every 8 (eight) hours as needed for  moderate pain.  Dispense: 90 tablet; Refill: 0 - HYDROcodone-acetaminophen (NORCO) 7.5-325 MG tablet; Take 1 tablet by mouth 3 (three) times daily as needed for moderate pain.  Dispense: 90 tablet; Refill: 0  10. Adjustment disorder with mixed anxiety and depressed mood - CMP14+EGFR - clorazepate (TRANXENE) 7.5 MG tablet; Take 1 tablet (7.5 mg total) by mouth 3 (three) times daily as needed.  Dispense: 90 tablet; Refill: 2  11. Chronic bilateral low back pain without sciatica - CMP14+EGFR - ToxASSURE Select 13 (MW), Urine  12. Foul smelling urine - CMP14+EGFR - Urinalysis, Complete  13. Trigeminal neuralgia - HYDROcodone-acetaminophen (NORCO) 7.5-325 MG tablet; Take 1 tablet by mouth every 8 (eight) hours as needed for moderate pain.  Dispense: 90 tablet; Refill: 0 - HYDROcodone-acetaminophen (NORCO) 7.5-325 MG tablet; Take 1 tablet by mouth every 8 (eight) hours as needed for moderate pain.  Dispense: 90 tablet; Refill: 0 - HYDROcodone-acetaminophen (NORCO) 7.5-325 MG tablet; Take 1 tablet by mouth 3 (three) times daily as needed for moderate pain.  Dispense: 90 tablet; Refill: 0  14. Chronic bilateral low back pain, with sciatica presence unspecified - HYDROcodone-acetaminophen (NORCO) 7.5-325 MG tablet; Take 1 tablet by mouth every 8 (eight) hours as needed for moderate pain.  Dispense: 90 tablet; Refill: 0 - HYDROcodone-acetaminophen (NORCO) 7.5-325 MG tablet; Take 1 tablet by mouth every 8 (eight) hours as needed for moderate pain.  Dispense: 90 tablet; Refill: 0 - HYDROcodone-acetaminophen (NORCO) 7.5-325 MG tablet; Take 1 tablet by mouth 3 (three) times daily as needed for moderate pain.  Dispense: 90 tablet; Refill: 0  15. Arthritis - HYDROcodone-acetaminophen (NORCO) 7.5-325 MG tablet; Take 1 tablet by mouth every 8 (eight) hours as needed for moderate pain.  Dispense: 90 tablet; Refill: 0 - HYDROcodone-acetaminophen (NORCO) 7.5-325 MG tablet; Take 1 tablet by mouth every 8  (eight) hours as needed for moderate pain.  Dispense: 90 tablet; Refill: 0 - HYDROcodone-acetaminophen (NORCO) 7.5-325 MG tablet; Take 1 tablet by mouth 3 (three) times daily as needed for moderate pain.  Dispense: 90 tablet; Refill: 0   Continue all meds Labs pending Health Maintenance reviewed, flu vaccine given today Diet and exercise encouraged RTO 3 months  Evelina Dun, FNP

## 2015-12-03 LAB — CMP14+EGFR
ALT: 22 IU/L (ref 0–44)
AST: 15 IU/L (ref 0–40)
Albumin/Globulin Ratio: 1.7 (ref 1.2–2.2)
Albumin: 4.4 g/dL (ref 3.5–5.5)
Alkaline Phosphatase: 127 IU/L — ABNORMAL HIGH (ref 39–117)
BUN/Creatinine Ratio: 9 (ref 9–20)
BUN: 5 mg/dL — ABNORMAL LOW (ref 6–24)
Bilirubin Total: 0.3 mg/dL (ref 0.0–1.2)
CO2: 28 mmol/L (ref 18–29)
Calcium: 9.1 mg/dL (ref 8.7–10.2)
Chloride: 88 mmol/L — ABNORMAL LOW (ref 96–106)
Creatinine, Ser: 0.54 mg/dL — ABNORMAL LOW (ref 0.76–1.27)
GFR calc Af Amer: 136 mL/min/{1.73_m2} (ref >59)
GFR calc non Af Amer: 117 mL/min/{1.73_m2} (ref >59)
Globulin, Total: 2.6 g/dL (ref 1.5–4.5)
Glucose: 114 mg/dL — ABNORMAL HIGH (ref 65–99)
Potassium: 3.3 mmol/L — ABNORMAL LOW (ref 3.5–5.2)
Sodium: 130 mmol/L — ABNORMAL LOW (ref 134–144)
Total Protein: 7 g/dL (ref 6.0–8.5)

## 2015-12-05 DIAGNOSIS — Z79899 Other long term (current) drug therapy: Secondary | ICD-10-CM | POA: Diagnosis not present

## 2015-12-05 DIAGNOSIS — S61219A Laceration without foreign body of unspecified finger without damage to nail, initial encounter: Secondary | ICD-10-CM | POA: Diagnosis not present

## 2015-12-05 DIAGNOSIS — F17200 Nicotine dependence, unspecified, uncomplicated: Secondary | ICD-10-CM | POA: Diagnosis not present

## 2015-12-05 DIAGNOSIS — E7800 Pure hypercholesterolemia, unspecified: Secondary | ICD-10-CM | POA: Diagnosis not present

## 2015-12-05 DIAGNOSIS — J449 Chronic obstructive pulmonary disease, unspecified: Secondary | ICD-10-CM | POA: Diagnosis not present

## 2015-12-05 DIAGNOSIS — Z23 Encounter for immunization: Secondary | ICD-10-CM | POA: Diagnosis not present

## 2015-12-05 DIAGNOSIS — I10 Essential (primary) hypertension: Secondary | ICD-10-CM | POA: Diagnosis not present

## 2015-12-05 DIAGNOSIS — X58XXXA Exposure to other specified factors, initial encounter: Secondary | ICD-10-CM | POA: Diagnosis not present

## 2015-12-05 DIAGNOSIS — M79644 Pain in right finger(s): Secondary | ICD-10-CM | POA: Diagnosis not present

## 2015-12-05 DIAGNOSIS — S68122A Partial traumatic metacarpophalangeal amputation of right middle finger, initial encounter: Secondary | ICD-10-CM | POA: Diagnosis not present

## 2015-12-05 DIAGNOSIS — S6991XA Unspecified injury of right wrist, hand and finger(s), initial encounter: Secondary | ICD-10-CM | POA: Diagnosis not present

## 2015-12-05 DIAGNOSIS — S68622A Partial traumatic transphalangeal amputation of right middle finger, initial encounter: Secondary | ICD-10-CM | POA: Diagnosis not present

## 2015-12-07 ENCOUNTER — Other Ambulatory Visit: Payer: Self-pay | Admitting: Family

## 2015-12-07 DIAGNOSIS — S61212A Laceration without foreign body of right middle finger without damage to nail, initial encounter: Secondary | ICD-10-CM | POA: Diagnosis not present

## 2015-12-07 DIAGNOSIS — I10 Essential (primary) hypertension: Secondary | ICD-10-CM

## 2015-12-09 LAB — TOXASSURE SELECT 13 (MW), URINE

## 2015-12-10 ENCOUNTER — Other Ambulatory Visit: Payer: Self-pay | Admitting: Family

## 2015-12-10 ENCOUNTER — Telehealth: Payer: Self-pay | Admitting: Family

## 2015-12-10 MED ORDER — ALPRAZOLAM 0.5 MG PO TABS: 0.5000 mg | ORAL_TABLET | Freq: Three times a day (TID) | ORAL | 1 refills | Status: DC | PRN

## 2015-12-10 NOTE — Telephone Encounter (Signed)
Script for xanax refill called to [facility] [place] [facility].

## 2015-12-10 NOTE — Telephone Encounter (Signed)
Please phone in xanax 0.5 mg TID prn rx.

## 2015-12-16 ENCOUNTER — Ambulatory Visit (INDEPENDENT_AMBULATORY_CARE_PROVIDER_SITE_OTHER): Payer: Medicare Other | Admitting: Family

## 2015-12-16 ENCOUNTER — Encounter: Payer: Self-pay | Admitting: Family

## 2015-12-16 ENCOUNTER — Ambulatory Visit (INDEPENDENT_AMBULATORY_CARE_PROVIDER_SITE_OTHER): Payer: Medicare Other

## 2015-12-16 ENCOUNTER — Telehealth: Payer: Self-pay | Admitting: Family

## 2015-12-16 ENCOUNTER — Other Ambulatory Visit: Payer: Self-pay | Admitting: Family

## 2015-12-16 VITALS — BP 128/82 | HR 58 | Temp 97.2°F | Ht 68.0 in | Wt 236.0 lb

## 2015-12-16 DIAGNOSIS — R0602 Shortness of breath: Secondary | ICD-10-CM | POA: Diagnosis not present

## 2015-12-16 DIAGNOSIS — R062 Wheezing: Secondary | ICD-10-CM

## 2015-12-16 DIAGNOSIS — Z0289 Encounter for other administrative examinations: Secondary | ICD-10-CM

## 2015-12-16 DIAGNOSIS — J42 Unspecified chronic bronchitis: Secondary | ICD-10-CM | POA: Diagnosis not present

## 2015-12-16 DIAGNOSIS — J209 Acute bronchitis, unspecified: Secondary | ICD-10-CM

## 2015-12-16 DIAGNOSIS — F17200 Nicotine dependence, unspecified, uncomplicated: Secondary | ICD-10-CM | POA: Diagnosis not present

## 2015-12-16 DIAGNOSIS — F1120 Opioid dependence, uncomplicated: Secondary | ICD-10-CM

## 2015-12-16 DIAGNOSIS — R911 Solitary pulmonary nodule: Secondary | ICD-10-CM

## 2015-12-16 MED ORDER — ALBUTEROL SULFATE HFA 108 (90 BASE) MCG/ACT IN AERS: 2.0000 | INHALATION_SPRAY | Freq: Four times a day (QID) | RESPIRATORY_TRACT | 2 refills | Status: DC | PRN

## 2015-12-16 MED ORDER — DOXYCYCLINE HYCLATE 100 MG PO TABS: 100.0000 mg | ORAL_TABLET | Freq: Two times a day (BID) | ORAL | 0 refills | Status: DC

## 2015-12-16 MED ORDER — PREDNISONE 10 MG (21) PO TBPK: ORAL_TABLET | ORAL | 0 refills | Status: DC

## 2015-12-16 NOTE — Progress Notes (Signed)
Subjective:    Patient ID: Matthew [name], male    DOB: 1961-01-07, 55 y.o.   MRN: [id number]  Shortness of Breath  Associated symptoms include swollen glands and wheezing. Pertinent negatives include no ear pain, rhinorrhea or sore throat.  URI   This is a new problem. The current episode started in the past 7 days. The problem has been gradually worsening. There has been no fever. Associated symptoms include congestion, coughing, swollen glands and wheezing. Pertinent negatives include no ear pain, nausea, rhinorrhea, sinus pain, sneezing or sore throat. He has tried decongestant and acetaminophen for the symptoms. The treatment provided mild relief.   *Last time patient was seen, drug screen was abnormal. Will repeat today.   Review of Systems  HENT: Positive for congestion. Negative for ear pain, rhinorrhea, sneezing and sore throat.   Respiratory: Positive for cough, shortness of breath and wheezing.   Gastrointestinal: Negative for nausea.  All other systems reviewed and are negative.      Objective:   Physical Exam  Constitutional: He is oriented to person, place, and time. He appears well-developed and well-nourished. No distress.  HENT:  Head: Normocephalic.  Right Ear: External ear normal.  Left Ear: External ear normal.  Nasal passage erythemas with mild swelling Oropharynx erythemas  Eyes: Pupils are equal, round, and reactive to light. Right eye exhibits no discharge. Left eye exhibits no discharge.  Neck: Normal range of motion. Neck supple. No thyromegaly present.  Cardiovascular: Normal rate, regular rhythm, normal heart sounds and intact distal pulses.   No murmur heard. Pulmonary/Chest: Effort normal. No respiratory distress. He has wheezes in the right upper field, the right middle field, the right lower field, the left upper field, the left middle field and the left lower field.  Coarse nonproductive cough   Abdominal: Soft. Bowel sounds are normal. He exhibits  no distension. There is no tenderness.  Musculoskeletal: Normal range of motion. He exhibits no edema or tenderness.  Neurological: He is alert and oriented to person, place, and time.  Skin: Skin is warm and dry. No rash noted. No erythema.  Psychiatric: He has a normal mood and affect. His behavior is normal. Judgment and thought content normal.  Vitals reviewed.   Chest X-Ray- COPD Preliminary reading by Evelina Dun, FNP [facility]   BP 128/82   Pulse (!) 58   Temp 97.2 F (36.2 C) (Oral)   Ht 5\' 8"  (1.727 m)   Wt 236 lb (107 kg)   SpO2 98%   BMI 35.88 kg/m      Assessment & Plan:  1. Wheezing - DG Chest 2 View; Future - albuterol (PROVENTIL HFA;VENTOLIN HFA) 108 (90 Base) MCG/ACT inhaler; Inhale 2 puffs into the lungs every 6 (six) hours as needed for wheezing or shortness of breath.  Dispense: 1 Inhaler; Refill: 2  2. SOB (shortness of breath) - DG Chest 2 View; Future - albuterol (PROVENTIL HFA;VENTOLIN HFA) 108 (90 Base) MCG/ACT inhaler; Inhale 2 puffs into the lungs every 6 (six) hours as needed for wheezing or shortness of breath.  Dispense: 1 Inhaler; Refill: 2  3. Chronic bronchitis, unspecified chronic bronchitis type (HCC) - albuterol (PROVENTIL HFA;VENTOLIN HFA) 108 (90 Base) MCG/ACT inhaler; Inhale 2 puffs into the lungs every 6 (six) hours as needed for wheezing or shortness of breath.  Dispense: 1 Inhaler; Refill: 2 - predniSONE (STERAPRED UNI-PAK 21 TAB) 10 MG (21) TBPK tablet; Use as directed  Dispense: 21 tablet; Refill: 0 - doxycycline (VIBRA-TABS) 100 MG  tablet; Take 1 tablet (100 mg total) by mouth 2 (two) times daily.  Dispense: 20 tablet; Refill: 0  4. Pain medication agreement signed - ToxASSURE Select 13 (MW), Urine  5. Uncomplicated opioid dependence ([place]) - ToxASSURE Select 13 (MW), Urine  6. Smoker  7. Acute bronchitis, unspecified organism -- Take meds as prescribed - Use a cool mist humidifier  -Use saline nose sprays frequently -Saline  irrigations of the nose can be very helpful if done frequently.  * 4X daily for 1 week*  * Use of a nettie pot can be helpful with this. Follow directions with this* -Force fluids -For any cough or congestion  Use plain Mucinex- regular strength or max strength is fine   * Children- consult with Pharmacist for dosing -For fever or aces or pains- take tylenol or ibuprofen appropriate for age and weight.  * for fevers greater than 101 orally you may alternate ibuprofen and tylenol every  3 hours. -Throat lozenges if help - predniSONE (STERAPRED UNI-PAK 21 TAB) 10 MG (21) TBPK tablet; Use as directed  Dispense: 21 tablet; Refill: 0 - doxycycline (VIBRA-TABS) 100 MG tablet; Take 1 tablet (100 mg total) by mouth 2 (two) times daily.  Dispense: 20 tablet; Refill: 0  Smoking cessation discussed Drug screen repeated Albuterol refilled Doxycycline started today RTO as needed and keep chronic follow up. IF SOB worsens return  Evelina Dun, [place]

## 2015-12-16 NOTE — Patient Instructions (Signed)
Chronic Obstructive Pulmonary Disease Chronic obstructive pulmonary disease (COPD) is a common lung condition in which airflow from the lungs is limited. COPD is a general term that can be used to describe many different lung problems that limit airflow, including both chronic bronchitis and emphysema. If you have COPD, your lung function will probably never return to normal, but there are measures you can take to improve lung function and make yourself feel better. CAUSES   Smoking (common).  Exposure to secondhand smoke.  Genetic problems.  Chronic inflammatory lung diseases or recurrent infections. SYMPTOMS  Shortness of breath, especially with physical activity.  Deep, persistent (chronic) cough with a large amount of thick mucus.  Wheezing.  Rapid breaths (tachypnea).  Gray or bluish discoloration (cyanosis) of the skin, especially in your fingers, toes, or lips.  Fatigue.  Weight loss.  Frequent infections or episodes when breathing symptoms become much worse (exacerbations).  Chest tightness. DIAGNOSIS Your health care provider will take a medical history and perform a physical examination to diagnose COPD. Additional tests for COPD may include:  Lung (pulmonary) function tests.  Chest X-ray.  CT scan.  Blood tests. TREATMENT  Treatment for COPD may include:  Inhaler and nebulizer medicines. These help manage the symptoms of COPD and make your breathing more comfortable.  Supplemental oxygen. Supplemental oxygen is only helpful if you have a low oxygen level in your blood.  Exercise and physical activity. These are beneficial for nearly all people with COPD.  Lung surgery or transplant.  Nutrition therapy to gain weight, if you are underweight.  Pulmonary rehabilitation. This may involve working with a team of health care providers and specialists, such as respiratory, occupational, and physical therapists. HOME CARE INSTRUCTIONS  Take all medicines  (inhaled or pills) as directed by your health care provider.  Avoid over-the-counter medicines or cough syrups that dry up your airway (such as antihistamines) and slow down the elimination of secretions unless instructed otherwise by your health care provider.  If you are a smoker, the most important thing that you can do is stop smoking. Continuing to smoke will cause further lung damage and breathing trouble. Ask your health care provider for help with quitting smoking. He or she can direct you to community resources or hospitals that provide support.  Avoid exposure to irritants such as smoke, chemicals, and fumes that aggravate your breathing.  Use oxygen therapy and pulmonary rehabilitation if directed by your health care provider. If you require home oxygen therapy, ask your health care provider whether you should purchase a pulse oximeter to measure your oxygen level at home.  Avoid contact with individuals who have a contagious illness.  Avoid extreme temperature and humidity changes.  Eat healthy foods. Eating smaller, more frequent meals and resting before meals may help you maintain your strength.  Stay active, but balance activity with periods of rest. Exercise and physical activity will help you maintain your ability to do things you want to do.  Preventing infection and hospitalization is very important when you have COPD. Make sure to receive all the vaccines your health care provider recommends, especially the pneumococcal and influenza vaccines. Ask your health care provider whether you need a pneumonia vaccine.  Learn and use relaxation techniques to manage stress.  Learn and use controlled breathing techniques as directed by your health care provider. Controlled breathing techniques include:  Pursed lip breathing. Start by breathing in (inhaling) through your nose for 1 second. Then, purse your lips as if you were   going to whistle and breathe out (exhale) through the  pursed lips for 2 seconds.  Diaphragmatic breathing. Start by putting one hand on your abdomen just above your waist. Inhale slowly through your nose. The hand on your abdomen should move out. Then purse your lips and exhale slowly. You should be able to feel the hand on your abdomen moving in as you exhale.  Learn and use controlled coughing to clear mucus from your lungs. Controlled coughing is a series of short, progressive coughs. The steps of controlled coughing are: 1. Lean your head slightly forward. 2. Breathe in deeply using diaphragmatic breathing. 3. Try to hold your breath for 3 seconds. 4. Keep your mouth slightly open while coughing twice. 5. Spit any mucus out into a tissue. 6. Rest and repeat the steps once or twice as needed. SEEK MEDICAL CARE IF:  You are coughing up more mucus than usual.  There is a change in the color or thickness of your mucus.  Your breathing is more labored than usual.  Your breathing is faster than usual. SEEK IMMEDIATE MEDICAL CARE IF:  You have shortness of breath while you are resting.  You have shortness of breath that prevents you from:  Being able to talk.  Performing your usual physical activities.  You have chest pain lasting longer than 5 minutes.  Your skin color is more cyanotic than usual.  You measure low oxygen saturations for longer than 5 minutes with a pulse oximeter. MAKE SURE YOU:  Understand these instructions.  Will watch your condition.  Will get help right away if you are not doing well or get worse.   This information is not intended to replace advice given to you by your health care provider. Make sure you discuss any questions you have with your health care provider.   Document Released: 11/09/2004 Document Revised: 02/20/2014 Document Reviewed: 09/26/2012 Elsevier Interactive Patient Education 2016 Elsevier Inc[employer]  

## 2015-12-17 ENCOUNTER — Telehealth: Payer: Self-pay | Admitting: *Deleted

## 2015-12-17 NOTE — Telephone Encounter (Signed)
This medication was changed to xanax 0.5 mg.

## 2015-12-17 NOTE — Telephone Encounter (Signed)
Number for patient does not accept calls.  Patient needs to call us and give more details.

## 2015-12-20 ENCOUNTER — Encounter: Payer: Self-pay | Admitting: Family

## 2015-12-20 ENCOUNTER — Ambulatory Visit (INDEPENDENT_AMBULATORY_CARE_PROVIDER_SITE_OTHER): Payer: Medicare Other | Admitting: Family

## 2015-12-20 VITALS — BP 135/76 | HR 58 | Temp 97.6°F | Ht 68.0 in | Wt 236.8 lb

## 2015-12-20 DIAGNOSIS — F17200 Nicotine dependence, unspecified, uncomplicated: Secondary | ICD-10-CM | POA: Diagnosis not present

## 2015-12-20 DIAGNOSIS — R062 Wheezing: Secondary | ICD-10-CM

## 2015-12-20 DIAGNOSIS — J209 Acute bronchitis, unspecified: Secondary | ICD-10-CM

## 2015-12-20 DIAGNOSIS — R938 Abnormal findings on diagnostic imaging of other specified body structures: Secondary | ICD-10-CM

## 2015-12-20 DIAGNOSIS — R9389 Abnormal findings on diagnostic imaging of other specified body structures: Secondary | ICD-10-CM

## 2015-12-20 NOTE — Progress Notes (Signed)
   Subjective:    Patient ID: Matthew [name], male    DOB: [birth year], 55 y.o.   MRN: [id number]  HPI PT presents to the office today recheck breathing. PT states his breathing is much better today. PT was seen in 12/16/15 and diagnosed with Acute bronchitis and was given prednisone an doxycycline. PT states he continues to cough, but has greatly improved. He states he continues to try to "cut back" on smoking.  PT's x-ray showed : 1. Nodular opacity projecting over the right upper lung. While this is partially superimposed over the costochondral junction of the right first rib and may simply represent asymmetric degenerative change, a pulmonary nodule cannot be excluded in this patient with a history of smoking. Recommend further evaluation with chest CT without contrast.   We have ordered a CT scan, but pt states it is not set up yet and he is unsure if he can get it this month. He states he is leaving to [place] to visit his son.  Review of Systems  Respiratory: Positive for cough. Negative for shortness of breath.   All other systems reviewed and are negative.      Objective:   Physical Exam  Constitutional: He is oriented to person, place, and time. He appears well-developed and well-nourished. No distress.  HENT:  Head: Normocephalic.  Right Ear: External ear normal.  Left Ear: External ear normal.  Nose: Mucosal edema and rhinorrhea present.  Eyes: Pupils are equal, round, and reactive to light. Right eye exhibits no discharge. Left eye exhibits no discharge.  Neck: Normal range of motion. Neck supple. No thyromegaly present.  Cardiovascular: Normal rate, regular rhythm, normal heart sounds and intact distal pulses.   No murmur heard. Pulmonary/Chest: Effort normal. No respiratory distress. He has wheezes in the right middle field, the right lower field, the left middle field and the left lower field.  Abdominal: Soft. Bowel sounds are normal. He exhibits no distension. There is no  tenderness.  Musculoskeletal: Normal range of motion. He exhibits no edema or tenderness.  Neurological: He is alert and oriented to person, place, and time. He has normal reflexes. No cranial nerve deficit.  Skin: Skin is warm and dry. No rash noted. No erythema.  Psychiatric: He has a normal mood and affect. His behavior is normal. Judgment and thought content normal.  Vitals reviewed.     BP 135/76   Pulse (!) 58   Temp 97.6 F (36.4 C) (Oral)   Ht 5\' 8"  (1.727 m)   Wt 236 lb 12.8 oz (107.4 kg)   BMI 36.01 kg/m      Assessment & Plan:  1. Acute bronchitis, unspecified organism 2. Abnormal chest x-ray 3. Wheezing 4. Current smoker  Smoking cessation discussed Continue doxycycline  CT scan scheduled today and pt given appt time RTO prn   Evelina Dun, FNP

## 2015-12-20 NOTE — Patient Instructions (Signed)

## 2015-12-20 NOTE — Telephone Encounter (Signed)
Patient was seen by provider today.

## 2015-12-21 DIAGNOSIS — S61212A Laceration without foreign body of right middle finger without damage to nail, initial encounter: Secondary | ICD-10-CM | POA: Diagnosis not present

## 2015-12-23 LAB — TOXASSURE SELECT 13 (MW), URINE

## 2015-12-27 ENCOUNTER — Ambulatory Visit (HOSPITAL_COMMUNITY): Payer: Medicare Other

## 2016-01-17 ENCOUNTER — Ambulatory Visit: Payer: Medicare Other | Admitting: Family

## 2016-01-18 ENCOUNTER — Other Ambulatory Visit: Payer: Self-pay | Admitting: Family

## 2016-01-18 ENCOUNTER — Ambulatory Visit (HOSPITAL_COMMUNITY)
Admission: RE | Admit: 2016-01-18 | Discharge: 2016-01-18 | Disposition: A | Discharge status: Home / Self Care | Payer: Medicare Other | Source: Ambulatory Visit | Attending: Family | Admitting: Family

## 2016-01-18 DIAGNOSIS — R911 Solitary pulmonary nodule: Secondary | ICD-10-CM

## 2016-01-18 DIAGNOSIS — I2510 Atherosclerotic heart disease of native coronary artery without angina pectoris: Secondary | ICD-10-CM | POA: Diagnosis not present

## 2016-01-18 DIAGNOSIS — I700 Atherosclerosis of aorta: Secondary | ICD-10-CM | POA: Insufficient documentation

## 2016-01-18 DIAGNOSIS — I2584 Coronary atherosclerosis due to calcified coronary lesion: Principal | ICD-10-CM

## 2016-01-20 ENCOUNTER — Ambulatory Visit: Payer: Medicare Other | Admitting: Family

## 2016-02-02 ENCOUNTER — Other Ambulatory Visit: Payer: Self-pay | Admitting: Family

## 2016-02-02 NOTE — Telephone Encounter (Signed)
Last filled 01/08/16, last seen 12/16/15. Route to pool A for call in

## 2016-02-02 NOTE — Telephone Encounter (Signed)
[facility] 

## 2016-02-24 ENCOUNTER — Encounter: Payer: Self-pay | Admitting: Family

## 2016-02-24 ENCOUNTER — Ambulatory Visit (INDEPENDENT_AMBULATORY_CARE_PROVIDER_SITE_OTHER): Payer: Medicare Other | Admitting: Family

## 2016-02-24 VITALS — BP 135/80 | HR 63 | Temp 97.0°F | Ht 68.0 in | Wt 240.8 lb

## 2016-02-24 DIAGNOSIS — N529 Male erectile dysfunction, unspecified: Secondary | ICD-10-CM

## 2016-02-24 DIAGNOSIS — M1990 Unspecified osteoarthritis, unspecified site: Secondary | ICD-10-CM

## 2016-02-24 DIAGNOSIS — G8929 Other chronic pain: Secondary | ICD-10-CM | POA: Diagnosis not present

## 2016-02-24 DIAGNOSIS — I2510 Atherosclerotic heart disease of native coronary artery without angina pectoris: Secondary | ICD-10-CM

## 2016-02-24 DIAGNOSIS — I700 Atherosclerosis of aorta: Secondary | ICD-10-CM | POA: Diagnosis not present

## 2016-02-24 DIAGNOSIS — F411 Generalized anxiety disorder: Secondary | ICD-10-CM | POA: Diagnosis not present

## 2016-02-24 DIAGNOSIS — Z0289 Encounter for other administrative examinations: Secondary | ICD-10-CM | POA: Diagnosis not present

## 2016-02-24 DIAGNOSIS — J42 Unspecified chronic bronchitis: Secondary | ICD-10-CM

## 2016-02-24 DIAGNOSIS — Z6841 Body Mass Index (BMI) 40.0 and over, adult: Secondary | ICD-10-CM

## 2016-02-24 DIAGNOSIS — G500 Trigeminal neuralgia: Secondary | ICD-10-CM | POA: Diagnosis not present

## 2016-02-24 DIAGNOSIS — I10 Essential (primary) hypertension: Secondary | ICD-10-CM

## 2016-02-24 DIAGNOSIS — F17200 Nicotine dependence, unspecified, uncomplicated: Secondary | ICD-10-CM

## 2016-02-24 DIAGNOSIS — G894 Chronic pain syndrome: Secondary | ICD-10-CM

## 2016-02-24 DIAGNOSIS — F1120 Opioid dependence, uncomplicated: Secondary | ICD-10-CM

## 2016-02-24 DIAGNOSIS — E782 Mixed hyperlipidemia: Secondary | ICD-10-CM

## 2016-02-24 DIAGNOSIS — I2584 Coronary atherosclerosis due to calcified coronary lesion: Secondary | ICD-10-CM

## 2016-02-24 DIAGNOSIS — M545 Low back pain: Secondary | ICD-10-CM | POA: Diagnosis not present

## 2016-02-24 DIAGNOSIS — E559 Vitamin D deficiency, unspecified: Secondary | ICD-10-CM

## 2016-02-24 MED ORDER — HYDROCODONE-ACETAMINOPHEN 7.5-325 MG PO TABS: 1.0000 | ORAL_TABLET | Freq: Three times a day (TID) | ORAL | 0 refills | Status: DC | PRN

## 2016-02-24 MED ORDER — ESCITALOPRAM OXALATE 10 MG PO TABS: 10.0000 mg | ORAL_TABLET | Freq: Every day | ORAL | 3 refills | Status: DC

## 2016-02-24 MED ORDER — ALPRAZOLAM 0.5 MG PO TABS: 0.5000 mg | ORAL_TABLET | Freq: Two times a day (BID) | ORAL | 3 refills | Status: DC | PRN

## 2016-02-24 NOTE — Patient Instructions (Signed)
Chronic Back Pain When back pain lasts longer than 3 months, it is called chronic back pain.The cause of your back pain may not be known. Some common causes include:  Wear and tear (degenerative disease) of the bones, ligaments, or disks in your back.  Inflammation and stiffness in your back (arthritis). People who have chronic back pain often go through certain periods in which the pain is more intense (flare-ups). Many people can learn to manage the pain with home care. Follow these instructions at home: Pay attention to any changes in your symptoms. Take these actions to help with your pain: Activity   Avoid bending and activities that make the problem worse.  Do not sit or stand in one place for long periods of time.  Take brief periods of rest throughout the day. This will reduce your pain. Resting in a lying or standing position is usually better than sitting to rest.  When you are resting for longer periods, mix in some mild activity or stretching between periods of rest. This will help to prevent stiffness and pain.  Get regular exercise. Ask your health care provider what activities are safe for you.  Do not lift anything that is heavier than 10 lb (4.5 kg). Always use proper lifting technique, which includes:  Bending your knees.  Keeping the load close to your body.  Avoiding twisting. Managing pain   If directed, apply ice to the painful area. Your health care provider may recommend applying ice during the first 24-48 hours after a flare-up begins.  Put ice in a plastic bag.  Place a towel between your skin and the bag.  Leave the ice on for 20 minutes, 2-3 times per day.  After icing, apply heat to the affected area as often as told by your health care provider. Use the heat source that your health care provider recommends, such as a moist heat pack or a heating pad.  Place a towel between your skin and the heat source.  Leave the heat on for 20-30  minutes.  Remove the heat if your skin turns bright red. This is especially important if you are unable to feel pain, heat, or cold. You may have a greater risk of getting burned.  Try soaking in a warm tub.  Take over-the-counter and prescription medicines only as told by your health care provider.  Keep all follow-up visits as told by your health care provider. This is important. Contact a health care provider if:  You have pain that is not relieved with rest or medicine. Get help right away if:  You have weakness or numbness in one or both of your legs or feet.  You have trouble controlling your bladder or your bowels.  You have nausea or vomiting.  You have pain in your abdomen.  You have shortness of breath or you faint. This information is not intended to replace advice given to you by your health care provider. Make sure you discuss any questions you have with your health care provider. Document Released: 03/09/2004 Document Revised: 06/10/2015 Document Reviewed: 07/20/2014 Elsevier Interactive Patient Education  2017 [employer]  

## 2016-02-24 NOTE — Progress Notes (Signed)
Subjective:    Patient ID: Matthew [name], male    DOB: 11-Jan-1961, 56 y.o.   MRN: [id number]  PT presents to the office today for chronic follow up and pain medication refill.  Medication Refill  Pertinent negatives include no coughing, headaches, joint swelling or weakness.  Hypertension  This is a chronic problem. The current episode started more than 1 year ago. The problem has been resolved since onset. The problem is controlled. Pertinent negatives include no anxiety, blurred vision, headaches, palpitations, peripheral edema or shortness of breath. Risk factors for coronary artery disease include dyslipidemia, family history, male gender, sedentary lifestyle, obesity and smoking/tobacco exposure. Past treatments include calcium channel blockers and diuretics. The current treatment provides moderate improvement. There is no history of angina, kidney disease, CAD/MI, CVA, heart failure or a thyroid problem. There is no history of sleep apnea.  Hyperlipidemia  This is a chronic problem. The current episode started more than 1 year ago. The problem is controlled. Recent lipid tests were reviewed and are normal. Exacerbating diseases include obesity. He has no history of diabetes. Factors aggravating his hyperlipidemia include smoking. Pertinent negatives include no shortness of breath. Current antihyperlipidemic treatment includes statins. The current treatment provides moderate improvement of lipids. Risk factors for coronary artery disease include dyslipidemia, hypertension, male sex, a sedentary lifestyle and family history.  Anxiety  Presents for follow-up visit. Onset was 1 to 5 years ago. The problem has been waxing and waning. Symptoms include impotence. Patient reports no decreased concentration, depressed mood, excessive worry, insomnia, nervous/anxious behavior, palpitations, restlessness or shortness of breath. Symptoms occur occasionally. The severity of symptoms is mild. The quality of  sleep is good. Nighttime awakenings: none.   There is no history of anxiety/panic attacks or depression. Past treatments include non-benzodiazephine anxiolytics (Tranxene). The treatment provided significant relief. Compliance with prior treatments has been good. Compliance with medications is 76-100%.  Arthritis  Presents for follow-up visit. The disease course has been worsening. He complains of pain and joint warmth. He reports no joint swelling. Affected locations include the right hip, left hip and right knee. His pain is at a severity of 7/10. Associated symptoms include pain while resting. His past medical history is significant for chronic back pain and osteoarthritis. Past treatments include activity, NSAIDs and rest. The treatment provided moderate relief.  Back Pain  This is a chronic problem. The current episode started more than 1 year ago. The problem occurs constantly. The problem is unchanged. The pain is present in the lumbar spine. The quality of the pain is described as aching. The pain is at a severity of 7/10. The pain is moderate. The symptoms are aggravated by bending. Pertinent negatives include no headaches or weakness.  COPD PT prescribed Breo daily. Pt reports smoking at least a pack of day. ED Pt states he could not afford Cialis or Viagra. Has taken samples of viagra and works well.   Pain assessment: Cause of pain- Arthritis and chronic back pain Pain location- Low back pain Pain on scale of 1-10- 7 Frequency- Intermittent What increases pain-Standing What makes pain Better-Pain medication  Current medications- Norco 7.5-'325mg'$  TID prn, #90 Effectiveness of current meds-stable   Pill count performed-No Urine drug screen- Yes Was the [place] reviewed- Yes   If yes were their any concerning findings? - Only received controlled medication for me.    Review of Systems  Constitutional: Negative.   HENT: Positive for dental problem. Negative for ear pain and  tinnitus.  Eyes: Negative.  Negative for blurred vision.  Respiratory: Positive for wheezing. Negative for cough, chest tightness and shortness of breath.   Cardiovascular: Negative.  Negative for palpitations.  Gastrointestinal: Negative.   Endocrine: Negative.   Genitourinary: Positive for impotence.  Musculoskeletal: Positive for arthritis and back pain. Negative for joint swelling.  Skin: Negative.   Allergic/Immunologic: Negative.   Neurological: Negative.  Negative for weakness and headaches.  Hematological: Negative.   Psychiatric/Behavioral: Negative for decreased concentration. The patient is not nervous/anxious and does not have insomnia.   All other systems reviewed and are negative.      Objective:   Physical Exam  Constitutional: He is oriented to person, place, and time. Vital signs are normal. He appears well-developed and well-nourished. No distress.  HENT:  Head: Normocephalic.  Right Ear: Tympanic membrane and external ear normal.  Left Ear: Tympanic membrane and external ear normal.  Nose: Nose normal.  Mouth/Throat: Oropharynx is clear and moist.    Eyes: Pupils are equal, round, and reactive to light. Right eye exhibits no discharge. Left eye exhibits no discharge.  Neck: Normal range of motion. Neck supple. Carotid bruit is not present. No thyroid mass and no thyromegaly present.  Cardiovascular: Normal rate, regular rhythm, normal heart sounds, intact distal pulses and normal pulses.  PMI is not displaced.   No murmur heard. Pulmonary/Chest: Effort normal. No respiratory distress. He has wheezes in the right lower field and the left lower field.  Abdominal: Soft. Normal appearance and bowel sounds are normal. He exhibits no distension. There is no tenderness.  Musculoskeletal: Normal range of motion. He exhibits no edema or tenderness.  Lymphadenopathy:       Head (right side): No submental and no tonsillar adenopathy present.       Head (left side): No  submental and no tonsillar adenopathy present.    He has no cervical adenopathy.  Neurological: He is alert and oriented to person, place, and time. He has normal strength. No sensory deficit.  Skin: Skin is warm, dry and intact. No rash noted. No erythema.  Psychiatric: He has a normal mood and affect. His speech is normal and behavior is normal. Judgment and thought content normal. His mood appears not anxious. Cognition and memory are normal.  Vitals reviewed.     BP 135/80   Pulse 63   Temp 97 F (36.1 C) (Oral)   Ht '5\' 8"'$  (1.727 m)   Wt 240 lb 12.8 oz (109.2 kg)   BMI 36.61 kg/m      Assessment & Plan:  1. Chronic pain syndrome - HYDROcodone-acetaminophen (NORCO) 7.5-325 MG tablet; Take 1 tablet by mouth every 8 (eight) hours as needed for moderate pain.  Dispense: 90 tablet; Refill: 0 - HYDROcodone-acetaminophen (NORCO) 7.5-325 MG tablet; Take 1 tablet by mouth every 8 (eight) hours as needed for moderate pain.  Dispense: 90 tablet; Refill: 0 - HYDROcodone-acetaminophen (NORCO) 7.5-325 MG tablet; Take 1 tablet by mouth 3 (three) times daily as needed for moderate pain.  Dispense: 90 tablet; Refill: 0 - CMP14+EGFR  2. Trigeminal neuralgia - HYDROcodone-acetaminophen (NORCO) 7.5-325 MG tablet; Take 1 tablet by mouth every 8 (eight) hours as needed for moderate pain.  Dispense: 90 tablet; Refill: 0 - HYDROcodone-acetaminophen (NORCO) 7.5-325 MG tablet; Take 1 tablet by mouth every 8 (eight) hours as needed for moderate pain.  Dispense: 90 tablet; Refill: 0 - HYDROcodone-acetaminophen (NORCO) 7.5-325 MG tablet; Take 1 tablet by mouth 3 (three) times daily as needed for moderate pain.  Dispense: 90 tablet; Refill: 0 - CMP14+EGFR  3. Pain medication agreement signed - HYDROcodone-acetaminophen (NORCO) 7.5-325 MG tablet; Take 1 tablet by mouth every 8 (eight) hours as needed for moderate pain.  Dispense: 90 tablet; Refill: 0 - HYDROcodone-acetaminophen (NORCO) 7.5-325 MG tablet;  Take 1 tablet by mouth every 8 (eight) hours as needed for moderate pain.  Dispense: 90 tablet; Refill: 0 - HYDROcodone-acetaminophen (NORCO) 7.5-325 MG tablet; Take 1 tablet by mouth 3 (three) times daily as needed for moderate pain.  Dispense: 90 tablet; Refill: 0 - CMP14+EGFR - ToxASSURE Select 13 (MW), Urine  4. Chronic bilateral low back pain, with sciatica presence unspecified - HYDROcodone-acetaminophen (NORCO) 7.5-325 MG tablet; Take 1 tablet by mouth every 8 (eight) hours as needed for moderate pain.  Dispense: 90 tablet; Refill: 0 - HYDROcodone-acetaminophen (NORCO) 7.5-325 MG tablet; Take 1 tablet by mouth every 8 (eight) hours as needed for moderate pain.  Dispense: 90 tablet; Refill: 0 - HYDROcodone-acetaminophen (NORCO) 7.5-325 MG tablet; Take 1 tablet by mouth 3 (three) times daily as needed for moderate pain.  Dispense: 90 tablet; Refill: 0 - CMP14+EGFR - ToxASSURE Select 13 (MW), Urine  5. Uncomplicated opioid dependence ([place]) - HYDROcodone-acetaminophen (NORCO) 7.5-325 MG tablet; Take 1 tablet by mouth every 8 (eight) hours as needed for moderate pain.  Dispense: 90 tablet; Refill: 0 - HYDROcodone-acetaminophen (NORCO) 7.5-325 MG tablet; Take 1 tablet by mouth every 8 (eight) hours as needed for moderate pain.  Dispense: 90 tablet; Refill: 0 - HYDROcodone-acetaminophen (NORCO) 7.5-325 MG tablet; Take 1 tablet by mouth 3 (three) times daily as needed for moderate pain.  Dispense: 90 tablet; Refill: 0 - CMP14+EGFR  6. Arthritis - HYDROcodone-acetaminophen (NORCO) 7.5-325 MG tablet; Take 1 tablet by mouth every 8 (eight) hours as needed for moderate pain.  Dispense: 90 tablet; Refill: 0 - HYDROcodone-acetaminophen (NORCO) 7.5-325 MG tablet; Take 1 tablet by mouth every 8 (eight) hours as needed for moderate pain.  Dispense: 90 tablet; Refill: 0 - HYDROcodone-acetaminophen (NORCO) 7.5-325 MG tablet; Take 1 tablet by mouth 3 (three) times daily as needed for moderate pain.   Dispense: 90 tablet; Refill: 0 - CMP14+EGFR - ToxASSURE Select 13 (MW), Urine  7. Smoker - CMP14+EGFR  8. Vitamin D deficiency - CMP14+EGFR  9. Chronic bronchitis, unspecified chronic bronchitis type ([name]) - CMP14+EGFR  10. Coronary artery calcification - CMP14+EGFR  11. Essential hypertension - CMP14+EGFR  12. Thoracic aortic atherosclerosis (HCC) - CMP14+EGFR  13. Erectile dysfunction, unspecified erectile dysfunction type - CMP14+EGFR  14. Mixed hyperlipidemia - CMP14+EGFR - Lipid panel  15. Morbid obesity with BMI of 40.0-44.9, adult ([place]) - CMP14+EGFR  16. GAD (generalized anxiety disorder) -Pt started on lexapro 10 mg today -Xanax decreased to BID from TID, #60 - CMP14+EGFR - ToxASSURE Select 13 (MW), Urine - escitalopram (LEXAPRO) 10 MG tablet; Take 1 tablet (10 mg total) by mouth daily.  Dispense: 90 tablet; Refill: 3 - ALPRAZolam (XANAX) 0.5 MG tablet; Take 1 tablet (0.5 mg total) by mouth 2 (two) times daily as needed for anxiety.  Dispense: 60 tablet; Refill: 3  Continue all meds Labs pending Health Maintenance reviewed Diet and exercise encouraged RTO 3 months  Evelina Dun, FNP

## 2016-02-25 LAB — CMP14+EGFR
A/G RATIO: 1.9 (ref 1.2–2.2)
ALBUMIN: 4.5 g/dL (ref 3.5–5.5)
ALT: 31 IU/L (ref 0–44)
AST: 21 IU/L (ref 0–40)
Alkaline Phosphatase: 109 IU/L (ref 39–117)
BUN / CREAT RATIO: 15 (ref 9–20)
BUN: 9 mg/dL (ref 6–24)
CALCIUM: 8.8 mg/dL (ref 8.7–10.2)
CHLORIDE: 97 mmol/L (ref 96–106)
CO2: 24 mmol/L (ref 18–29)
Creatinine, Ser: 0.61 mg/dL — ABNORMAL LOW (ref 0.76–1.27)
GFR, EST AFRICAN AMERICAN: 129 mL/min/{1.73_m2} (ref >59)
GFR, EST NON AFRICAN AMERICAN: 112 mL/min/{1.73_m2} (ref >59)
GLOBULIN, TOTAL: 2.4 g/dL (ref 1.5–4.5)
Glucose: 74 mg/dL (ref 65–99)
POTASSIUM: 4.4 mmol/L (ref 3.5–5.2)
SODIUM: 138 mmol/L (ref 134–144)
TOTAL PROTEIN: 6.9 g/dL (ref 6.0–8.5)

## 2016-02-25 LAB — LIPID PANEL
Chol/HDL Ratio: 2.9 ratio units (ref 0.0–5.0)
Cholesterol, Total: 175 mg/dL (ref 100–199)
HDL: 60 mg/dL (ref >39)
LDL Calculated: 74 mg/dL (ref 0–99)
Triglycerides: 206 mg/dL — ABNORMAL HIGH (ref 0–149)
VLDL Cholesterol Cal: 41 mg/dL — ABNORMAL HIGH (ref 5–40)

## 2016-02-28 ENCOUNTER — Telehealth: Payer: Self-pay | Admitting: Family

## 2016-02-28 NOTE — Telephone Encounter (Signed)
Pt notified of results Verbalizes understanding 

## 2016-03-01 LAB — TOXASSURE SELECT 13 (MW), URINE

## 2016-03-03 ENCOUNTER — Ambulatory Visit: Payer: Medicare Other | Admitting: Family

## 2016-03-03 ENCOUNTER — Other Ambulatory Visit: Payer: Self-pay | Admitting: Family

## 2016-03-09 ENCOUNTER — Other Ambulatory Visit: Payer: Self-pay | Admitting: Family

## 2016-03-09 DIAGNOSIS — G500 Trigeminal neuralgia: Secondary | ICD-10-CM

## 2016-03-09 DIAGNOSIS — M545 Low back pain: Secondary | ICD-10-CM

## 2016-03-09 DIAGNOSIS — G894 Chronic pain syndrome: Secondary | ICD-10-CM

## 2016-03-09 DIAGNOSIS — Z9114 Patient's other noncompliance with medication regimen: Secondary | ICD-10-CM

## 2016-03-09 DIAGNOSIS — G8929 Other chronic pain: Secondary | ICD-10-CM

## 2016-03-17 ENCOUNTER — Other Ambulatory Visit: Payer: Self-pay | Admitting: Family

## 2016-03-17 DIAGNOSIS — J42 Unspecified chronic bronchitis: Secondary | ICD-10-CM

## 2016-03-30 ENCOUNTER — Other Ambulatory Visit: Payer: Self-pay | Admitting: Family

## 2016-03-30 DIAGNOSIS — E785 Hyperlipidemia, unspecified: Secondary | ICD-10-CM

## 2016-03-31 ENCOUNTER — Other Ambulatory Visit: Payer: Self-pay | Admitting: Family

## 2016-03-31 DIAGNOSIS — R062 Wheezing: Secondary | ICD-10-CM

## 2016-03-31 DIAGNOSIS — J42 Unspecified chronic bronchitis: Secondary | ICD-10-CM

## 2016-03-31 DIAGNOSIS — R0602 Shortness of breath: Secondary | ICD-10-CM

## 2016-04-26 ENCOUNTER — Telehealth: Payer: Self-pay | Admitting: Family

## 2016-04-26 MED ORDER — MAGIC MOUTHWASH W/LIDOCAINE: 5.0000 mL | Freq: Three times a day (TID) | ORAL | 1 refills | Status: DC | PRN

## 2016-04-26 NOTE — Telephone Encounter (Signed)
Please call rx in to pharmacy. RX won't send electronically.

## 2016-04-26 NOTE — Telephone Encounter (Signed)
What is the name of the medication? MMM W/ Lidocaine  Have you contacted your pharmacy to request a refill? Yes [facility]   Which pharmacy would you like this sent to? [facility]   Patient notified that their request is being sent to the clinical staff for review and that they should receive a call once it is complete. If they do not receive a call within 24 hours they can check with their pharmacy or our office.

## 2016-04-27 NOTE — Telephone Encounter (Signed)
[facility], tried to call pt but no answer and voicemail not set up.

## 2016-05-22 ENCOUNTER — Other Ambulatory Visit: Payer: Self-pay | Admitting: Family

## 2016-05-22 DIAGNOSIS — I10 Essential (primary) hypertension: Secondary | ICD-10-CM

## 2016-05-24 ENCOUNTER — Ambulatory Visit: Payer: Medicare Other | Admitting: Family

## 2016-05-25 ENCOUNTER — Encounter: Payer: Self-pay | Admitting: Family

## 2016-05-25 ENCOUNTER — Telehealth: Payer: Self-pay | Admitting: Family

## 2016-05-25 NOTE — Telephone Encounter (Signed)
Patient has no voice mail to leave a message on and has not answered calls.

## 2016-06-01 ENCOUNTER — Ambulatory Visit (INDEPENDENT_AMBULATORY_CARE_PROVIDER_SITE_OTHER): Payer: Medicare Other | Admitting: Family

## 2016-06-01 ENCOUNTER — Encounter: Payer: Self-pay | Admitting: Family

## 2016-06-01 VITALS — BP 157/88 | HR 62 | Temp 97.0°F | Ht 68.0 in | Wt 245.2 lb

## 2016-06-01 DIAGNOSIS — Z1211 Encounter for screening for malignant neoplasm of colon: Secondary | ICD-10-CM

## 2016-06-01 DIAGNOSIS — I700 Atherosclerosis of aorta: Secondary | ICD-10-CM

## 2016-06-01 DIAGNOSIS — J301 Allergic rhinitis due to pollen: Secondary | ICD-10-CM

## 2016-06-01 DIAGNOSIS — F411 Generalized anxiety disorder: Secondary | ICD-10-CM

## 2016-06-01 DIAGNOSIS — I10 Essential (primary) hypertension: Secondary | ICD-10-CM | POA: Diagnosis not present

## 2016-06-01 DIAGNOSIS — N529 Male erectile dysfunction, unspecified: Secondary | ICD-10-CM | POA: Diagnosis not present

## 2016-06-01 DIAGNOSIS — J42 Unspecified chronic bronchitis: Secondary | ICD-10-CM

## 2016-06-01 DIAGNOSIS — M545 Low back pain: Secondary | ICD-10-CM | POA: Diagnosis not present

## 2016-06-01 DIAGNOSIS — F4323 Adjustment disorder with mixed anxiety and depressed mood: Secondary | ICD-10-CM

## 2016-06-01 DIAGNOSIS — F17200 Nicotine dependence, unspecified, uncomplicated: Secondary | ICD-10-CM | POA: Diagnosis not present

## 2016-06-01 DIAGNOSIS — I2510 Atherosclerotic heart disease of native coronary artery without angina pectoris: Secondary | ICD-10-CM | POA: Diagnosis not present

## 2016-06-01 DIAGNOSIS — G8929 Other chronic pain: Secondary | ICD-10-CM

## 2016-06-01 DIAGNOSIS — Z6841 Body Mass Index (BMI) 40.0 and over, adult: Secondary | ICD-10-CM

## 2016-06-01 DIAGNOSIS — E782 Mixed hyperlipidemia: Secondary | ICD-10-CM | POA: Diagnosis not present

## 2016-06-01 DIAGNOSIS — I2584 Coronary atherosclerosis due to calcified coronary lesion: Secondary | ICD-10-CM | POA: Diagnosis not present

## 2016-06-01 MED ORDER — CETIRIZINE HCL 10 MG PO TABS: 10.0000 mg | ORAL_TABLET | Freq: Every day | ORAL | 11 refills | Status: DC

## 2016-06-01 MED ORDER — ALPRAZOLAM 0.5 MG PO TABS: 0.5000 mg | ORAL_TABLET | Freq: Two times a day (BID) | ORAL | 3 refills | Status: DC | PRN

## 2016-06-01 NOTE — Patient Instructions (Signed)
Allergic Rhinitis Allergic rhinitis is when the mucous membranes in the nose respond to allergens. Allergens are particles in the air that cause your body to have an allergic reaction. This causes you to release allergic antibodies. Through a chain of events, these eventually cause you to release histamine into the blood stream. Although meant to protect the body, it is this release of histamine that causes your discomfort, such as frequent sneezing, congestion, and an itchy, runny nose. What are the causes? Seasonal allergic rhinitis (hay fever) is caused by pollen allergens that may come from grasses, trees, and weeds. Year-round allergic rhinitis (perennial allergic rhinitis) is caused by allergens such as house dust mites, pet dander, and mold spores. What are the signs or symptoms?  Nasal stuffiness (congestion).  Itchy, runny nose with sneezing and tearing of the eyes. How is this diagnosed? Your health care provider can help you determine the allergen or allergens that trigger your symptoms. If you and your health care provider are unable to determine the allergen, skin or blood testing may be used. Your health care provider will diagnose your condition after taking your health history and performing a physical exam. Your health care provider may assess you for other related conditions, such as asthma, pink eye, or an ear infection. How is this treated? Allergic rhinitis does not have a cure, but it can be controlled by:  Medicines that block allergy symptoms. These may include allergy shots, nasal sprays, and oral antihistamines.  Avoiding the allergen. Hay fever may often be treated with antihistamines in pill or nasal spray forms. Antihistamines block the effects of histamine. There are over-the-counter medicines that may help with nasal congestion and swelling around the eyes. Check with your health care provider before taking or giving this medicine. If avoiding the allergen or the  medicine prescribed do not work, there are many new medicines your health care provider can prescribe. Stronger medicine may be used if initial measures are ineffective. Desensitizing injections can be used if medicine and avoidance does not work. Desensitization is when a patient is given ongoing shots until the body becomes less sensitive to the allergen. Make sure you follow up with your health care provider if problems continue. Follow these instructions at home: It is not possible to completely avoid allergens, but you can reduce your symptoms by taking steps to limit your exposure to them. It helps to know exactly what you are allergic to so that you can avoid your specific triggers. Contact a health care provider if:  You have a fever.  You develop a cough that does not stop easily (persistent).  You have shortness of breath.  You start wheezing.  Symptoms interfere with normal daily activities. This information is not intended to replace advice given to you by your health care provider. Make sure you discuss any questions you have with your health care provider. Document Released: 10/25/2000 Document Revised: 10/01/2015 Document Reviewed: 10/07/2012 Elsevier Interactive Patient Education  2017 [employer]vier Inc.  

## 2016-06-01 NOTE — Progress Notes (Signed)
Subjective:    Patient ID: Matthew [name], male    DOB: [birth year], 56 y.o.   MRN: [id number]  PT presents to the office today for chronic follow up. Hyperlipidemia  This is a chronic problem. The current episode started more than 1 year ago. The problem is controlled. Recent lipid tests were reviewed and are normal. Exacerbating diseases include obesity. He has no history of diabetes. Factors aggravating his hyperlipidemia include smoking. Pertinent negatives include no shortness of breath. Current antihyperlipidemic treatment includes statins. The current treatment provides moderate improvement of lipids. Risk factors for coronary artery disease include dyslipidemia, hypertension, male sex, a sedentary lifestyle and family history.  Hypertension  This is a chronic problem. The current episode started more than 1 year ago. The problem has been waxing and waning since onset. The problem is controlled. Pertinent negatives include no anxiety, blurred vision, palpitations, peripheral edema or shortness of breath. Risk factors for coronary artery disease include dyslipidemia, family history, male gender, sedentary lifestyle, obesity and smoking/tobacco exposure. Past treatments include calcium channel blockers and diuretics. The current treatment provides moderate improvement. There is no history of angina, kidney disease, CAD/MI, CVA or heart failure. There is no history of sleep apnea or a thyroid problem.  Anxiety  Presents for follow-up visit. Onset was 1 to 5 years ago. The problem has been waxing and waning. Symptoms include excessive worry, impotence and nervous/anxious behavior. Patient reports no decreased concentration, depressed mood, insomnia, palpitations, restlessness or shortness of breath. Symptoms occur occasionally. The severity of symptoms is mild. The quality of sleep is good. Nighttime awakenings: none.   There is no history of anxiety/panic attacks or depression. Past treatments include  non-benzodiazephine anxiolytics (Tranxene). The treatment provided significant relief. Compliance with prior treatments has been good. Compliance with medications is 76-100%.  Arthritis  Presents for follow-up visit. The disease course has been worsening. He complains of pain and joint warmth. Affected locations include the right hip, left hip and right knee. His pain is at a severity of 7/10. Associated symptoms include pain while resting. His past medical history is significant for chronic back pain and osteoarthritis. Past treatments include activity, NSAIDs and rest. The treatment provided moderate relief.  Back Pain  This is a chronic problem. The current episode started more than 1 year ago. The problem occurs constantly. The problem is unchanged. The pain is present in the lumbar spine. The quality of the pain is described as aching. The pain is at a severity of 7/10. The pain is moderate. The symptoms are aggravated by bending.  COPD PT prescribed Breo daily. Pt reports smoking at least a pack of day. ED Pt states he could not afford Cialis or Viagra. Has taken samples of viagra and works well.    Review of Systems  Constitutional: Negative.   HENT: Positive for dental problem. Negative for ear pain and tinnitus.   Eyes: Negative.  Negative for blurred vision.  Respiratory: Positive for wheezing. Negative for chest tightness and shortness of breath.   Cardiovascular: Negative.  Negative for palpitations.  Gastrointestinal: Negative.   Endocrine: Negative.   Genitourinary: Positive for impotence.  Musculoskeletal: Positive for arthritis and back pain.  Skin: Negative.   Allergic/Immunologic: Negative.   Neurological: Negative.   Hematological: Negative.   Psychiatric/Behavioral: Negative for decreased concentration. The patient is nervous/anxious. The patient does not have insomnia.   All other systems reviewed and are negative.      Objective:   Physical Exam  Constitutional:  He is oriented to person, place, and time. Vital signs are normal. He appears well-developed and well-nourished. No distress.  HENT:  Head: Normocephalic.  Right Ear: Tympanic membrane and external ear normal.  Left Ear: Tympanic membrane and external ear normal.  Nose: Mucosal edema and rhinorrhea present.  Mouth/Throat: Posterior oropharyngeal erythema present.    Eyes: Pupils are equal, round, and reactive to light. Right eye exhibits no discharge. Left eye exhibits no discharge.  Neck: Normal range of motion. Neck supple. Carotid bruit is not present. No thyroid mass and no thyromegaly present.  Cardiovascular: Normal rate, regular rhythm, normal heart sounds, intact distal pulses and normal pulses.  PMI is not displaced.   No murmur heard. Pulmonary/Chest: Effort normal. No respiratory distress. He has decreased breath sounds. He has wheezes in the right lower field and the left lower field.  Abdominal: Soft. Normal appearance and bowel sounds are normal. He exhibits no distension. There is no tenderness.  Musculoskeletal: Normal range of motion. He exhibits no edema or tenderness.  Lymphadenopathy:       Head (right side): No submental and no tonsillar adenopathy present.       Head (left side): No submental and no tonsillar adenopathy present.    He has no cervical adenopathy.  Neurological: He is alert and oriented to person, place, and time. He has normal strength. No sensory deficit.  Skin: Skin is warm, dry and intact. No rash noted. No erythema.  Psychiatric: He has a normal mood and affect. His speech is normal and behavior is normal. Judgment and thought content normal. His mood appears not anxious. Cognition and memory are normal.  Vitals reviewed.     BP (!) 157/88   Pulse 62   Temp 97 F (36.1 C) (Oral)   Ht _0  (1.727 m)   Wt 245 lb 3.2 oz (111.2 kg)   BMI 37.28 kg/m      Assessment & Plan:  1. Essential hypertension - CMP14+EGFR  2. Thoracic aortic  atherosclerosis (HCC) - CMP14+EGFR - Lipid panel  3. Coronary artery calcification - CMP14+EGFR - Lipid panel  4. Chronic bronchitis, unspecified chronic bronchitis type ([place]) - CMP14+EGFR  5. Erectile dysfunction, unspecified erectile dysfunction type - CMP14+EGFR  6. Smoker - CMP14+EGFR  7. Morbid obesity with BMI of 40.0-44.9, adult (HCC) - CMP14+EGFR  8. Mixed hyperlipidemia - CMP14+EGFR  9. Adjustment disorder with mixed anxiety and depressed mood - CMP14+EGFR - ALPRAZolam (XANAX) 0.5 MG tablet; Take 1 tablet (0.5 mg total) by mouth 2 (two) times daily as needed for anxiety.  Dispense: 60 tablet; Refill: 3  10. Chronic bilateral low back pain without sciatica  - CMP14+EGFR  11. Colon cancer screening - CMP14+EGFR - Fecal occult blood, imunochemical; Future  12. Allergic rhinitis due to pollen, unspecified seasonality  - cetirizine (ZYRTEC) 10 MG tablet; Take 1 tablet (10 mg total) by mouth daily.  Dispense: 30 tablet; Refill: 11  13. GAD (generalized anxiety disorder) - ALPRAZolam (XANAX) 0.5 MG tablet; Take 1 tablet (0.5 mg total) by mouth 2 (two) times daily as needed for anxiety.  Dispense: 60 tablet; Refill: 3   Continue all meds Labs pending Health Maintenance reviewed Diet and exercise encouraged RTO 3 months   Evelina Dun, FNP

## 2016-06-02 LAB — CMP14+EGFR
ALBUMIN: 4.3 g/dL (ref 3.5–5.5)
ALK PHOS: 112 IU/L (ref 39–117)
ALT: 28 IU/L (ref 0–44)
AST: 17 IU/L (ref 0–40)
Albumin/Globulin Ratio: 1.8 (ref 1.2–2.2)
BILIRUBIN TOTAL: 0.3 mg/dL (ref 0.0–1.2)
BUN / CREAT RATIO: 11 (ref 9–20)
BUN: 8 mg/dL (ref 6–24)
CO2: 23 mmol/L (ref 18–29)
Calcium: 9 mg/dL (ref 8.7–10.2)
Chloride: 96 mmol/L (ref 96–106)
Creatinine, Ser: 0.71 mg/dL — ABNORMAL LOW (ref 0.76–1.27)
GFR calc Af Amer: 121 mL/min/{1.73_m2} (ref >59)
GFR calc non Af Amer: 105 mL/min/{1.73_m2} (ref >59)
GLUCOSE: 118 mg/dL — AB (ref 65–99)
Globulin, Total: 2.4 g/dL (ref 1.5–4.5)
Potassium: 4.4 mmol/L (ref 3.5–5.2)
Sodium: 136 mmol/L (ref 134–144)
Total Protein: 6.7 g/dL (ref 6.0–8.5)

## 2016-06-02 LAB — LIPID PANEL
CHOLESTEROL TOTAL: 171 mg/dL (ref 100–199)
Chol/HDL Ratio: 2.6 ratio (ref 0.0–5.0)
HDL: 67 mg/dL (ref >39)
LDL Calculated: 88 mg/dL (ref 0–99)
TRIGLYCERIDES: 81 mg/dL (ref 0–149)
VLDL CHOLESTEROL CAL: 16 mg/dL (ref 5–40)

## 2016-06-08 ENCOUNTER — Other Ambulatory Visit: Payer: Self-pay | Admitting: Family

## 2016-06-08 MED ORDER — GABAPENTIN 300 MG PO CAPS: 300.0000 mg | ORAL_CAPSULE | Freq: Three times a day (TID) | ORAL | 3 refills | Status: DC

## 2016-06-08 NOTE — Progress Notes (Signed)
Pt complaining of increased pain in right face and jaw today. States the pain is constant and is increased. Will send in gabapentin 300 mg TID.

## 2016-06-09 ENCOUNTER — Encounter: Payer: Self-pay | Admitting: Family

## 2016-06-09 ENCOUNTER — Ambulatory Visit (INDEPENDENT_AMBULATORY_CARE_PROVIDER_SITE_OTHER): Payer: Medicare Other | Admitting: Family

## 2016-06-09 VITALS — BP 133/85 | HR 58 | Temp 97.3°F | Ht 68.0 in | Wt 242.0 lb

## 2016-06-09 DIAGNOSIS — I2510 Atherosclerotic heart disease of native coronary artery without angina pectoris: Secondary | ICD-10-CM | POA: Diagnosis not present

## 2016-06-09 DIAGNOSIS — I2584 Coronary atherosclerosis due to calcified coronary lesion: Secondary | ICD-10-CM

## 2016-06-09 DIAGNOSIS — G500 Trigeminal neuralgia: Secondary | ICD-10-CM | POA: Diagnosis not present

## 2016-06-09 NOTE — Patient Instructions (Signed)
Trigeminal Neuralgia Trigeminal neuralgia is a nerve disorder that causes attacks of severe facial pain. The attacks last from a few seconds to several minutes. They can happen for days, weeks, or months and then go away for months or years. Trigeminal neuralgia is also called tic douloureux. What are the causes? This condition is caused by damage to a nerve in the face that is called the trigeminal nerve. An attack can be triggered by:  Talking.  Chewing.  Putting on makeup.  Washing your face.  Shaving your face.  Brushing your teeth.  Touching your face. What increases the risk? This condition is more likely to develop in:  Women.  People who are 84 years of age or older. What are the signs or symptoms? The main symptom of this condition is pain in the jaw, lips, eyes, nose, scalp, forehead, and face. The pain may be intense, stabbing, electric, or shock-like. How is this diagnosed? This condition is diagnosed with a physical exam. A CT scan or MRI may be done to rule out other conditions that can cause facial pain. How is this treated? This condition may be treated with:  Avoiding the things that trigger your attacks.  Pain medicine.  Surgery. This may be done in severe cases if other medical treatment does not provide relief. Follow these instructions at home:  Take over-the-counter and prescription medicines only as told by your health care provider.  If you wish to get pregnant, talk with your health care provider before you start trying to get pregnant.  Avoid the things that trigger your attacks. It may help to:  Chew on the unaffected side of your mouth.  Avoid touching your face.  Avoid blasts of hot or cold air. Contact a health care provider if:  Your pain medicine is not helping.  You develop new, unexplained symptoms, such as:  Double vision.  Facial weakness.  Changes in hearing or balance.  You become pregnant. Get help right away  if:  Your pain is unbearable, and your pain medicine does not help. This information is not intended to replace advice given to you by your health care provider. Make sure you discuss any questions you have with your health care provider. Document Released: 01/28/2000 Document Revised: 10/03/2015 Document Reviewed: 05/25/2014 Elsevier Interactive Patient Education  2017 [employer].

## 2016-06-09 NOTE — Progress Notes (Signed)
   Subjective:    Patient ID: Matthew [name], male    DOB: 04/11/1960, 56 y.o.   MRN: [id number]  HPI PT presents to the office today for recurrent right jaw pain. Pt states he has trigeminal neuralgia. Pt currently taking  Tegretol 400 mg QID and was started on gabapentin 300 mg TID yesterday. Pt states the gabapentin has helped slightly. He reports having intermittent shooting nerve pain of 7-8 out 10. PT states currently he is not having any pain. States eating makes the pain worse, but the aleve and gabapentin has greatly helped.    Review of Systems  Neurological:       Nerve pain in right jaw   All other systems reviewed and are negative.      Objective:   Physical Exam  Constitutional: He is oriented to person, place, and time. He appears well-developed and well-nourished. No distress.  HENT:  Head: Normocephalic.  Right Ear: External ear normal.  Left Ear: External ear normal.  Mouth/Throat: Abnormal dentition (no teeth present).  Eyes: Pupils are equal, round, and reactive to light. Right eye exhibits no discharge. Left eye exhibits no discharge.  Neck: Normal range of motion. Neck supple. No thyromegaly present.  Cardiovascular: Normal rate, regular rhythm, normal heart sounds and intact distal pulses.   No murmur heard. Pulmonary/Chest: Effort normal and breath sounds normal. No respiratory distress. He has no wheezes.  Abdominal: Soft. Bowel sounds are normal. He exhibits no distension. There is no tenderness.  Musculoskeletal: Normal range of motion. He exhibits no edema or tenderness.  Neurological: He is alert and oriented to person, place, and time.  Skin: Skin is warm and dry. No rash noted. No erythema.  Psychiatric: He has a normal mood and affect. His behavior is normal. Judgment and thought content normal.  Vitals reviewed.    BP 133/85   Pulse (!) 58   Temp 97.3 F (36.3 C) (Oral)   Ht 5\' 8"  (1.727 m)   Wt 242 lb (109.8 kg)   BMI 36.80 kg/m         Assessment & Plan:  1. Trigeminal neuralgia Continue gabapentin, tegretol, and aleve as needed Avoid chewing on that side Avoid extreme hots or cold temps RTO prn and keep chronic follow up  Evelina Dun, FNP

## 2016-06-22 ENCOUNTER — Ambulatory Visit (INDEPENDENT_AMBULATORY_CARE_PROVIDER_SITE_OTHER): Payer: Medicare Other | Admitting: Family

## 2016-06-22 ENCOUNTER — Encounter: Payer: Self-pay | Admitting: Family

## 2016-06-22 VITALS — BP 133/81 | HR 59 | Temp 97.6°F | Ht 68.0 in | Wt 247.0 lb

## 2016-06-22 DIAGNOSIS — J440 Chronic obstructive pulmonary disease with acute lower respiratory infection: Secondary | ICD-10-CM | POA: Diagnosis not present

## 2016-06-22 DIAGNOSIS — I2584 Coronary atherosclerosis due to calcified coronary lesion: Secondary | ICD-10-CM | POA: Diagnosis not present

## 2016-06-22 DIAGNOSIS — I2510 Atherosclerotic heart disease of native coronary artery without angina pectoris: Secondary | ICD-10-CM | POA: Diagnosis not present

## 2016-06-22 DIAGNOSIS — J209 Acute bronchitis, unspecified: Secondary | ICD-10-CM | POA: Diagnosis not present

## 2016-06-22 DIAGNOSIS — F17200 Nicotine dependence, unspecified, uncomplicated: Secondary | ICD-10-CM | POA: Diagnosis not present

## 2016-06-22 MED ORDER — PREDNISONE 20 MG PO TABS: ORAL_TABLET | ORAL | 0 refills | Status: DC

## 2016-06-22 NOTE — Progress Notes (Signed)
   Subjective:    Patient ID: Matthew [name], male    DOB: Jul 28, 1960, 56 y.o.   MRN: [id number]  Cough  This is a recurrent problem. The current episode started 1 to 4 weeks ago. The problem has been waxing and waning. The problem occurs every few minutes. The cough is productive of purulent sputum. Associated symptoms include nasal congestion, postnasal drip, shortness of breath and wheezing. Pertinent negatives include no chills, ear congestion, ear pain, fever, headaches, myalgias or sore throat. The symptoms are aggravated by lying down. Risk factors for lung disease include smoking/tobacco exposure. He has tried rest for the symptoms. The treatment provided no relief. His past medical history is significant for COPD.      Review of Systems  Constitutional: Negative for chills and fever.  HENT: Positive for postnasal drip. Negative for ear pain and sore throat.   Respiratory: Positive for cough, shortness of breath and wheezing.   Musculoskeletal: Negative for myalgias.  Neurological: Negative for headaches.  All other systems reviewed and are negative.      Objective:   Physical Exam  Constitutional: He is oriented to person, place, and time. He appears well-developed and well-nourished. No distress.  HENT:  Head: Normocephalic.  Right Ear: External ear normal.  Left Ear: External ear normal.  Nose: Mucosal edema and rhinorrhea present.  Mouth/Throat: Posterior oropharyngeal erythema present.  Eyes: Pupils are equal, round, and reactive to light. Right eye exhibits no discharge. Left eye exhibits no discharge.  Neck: Normal range of motion. Neck supple. No thyromegaly present.  Cardiovascular: Normal rate, regular rhythm, normal heart sounds and intact distal pulses.   No murmur heard. Pulmonary/Chest: Effort normal. No respiratory distress. He has decreased breath sounds. He has wheezes.  Abdominal: Soft. Bowel sounds are normal. He exhibits no distension. There is no tenderness.   Musculoskeletal: Normal range of motion. He exhibits no edema or tenderness.  Neurological: He is alert and oriented to person, place, and time. He has normal reflexes. No cranial nerve deficit.  Skin: Skin is warm and dry. No rash noted. No erythema.  Psychiatric: He has a normal mood and affect. His behavior is normal. Judgment and thought content normal.  Vitals reviewed.     BP 133/81   Pulse (!) 59   Temp 97.6 F (36.4 C) (Oral)   Ht 5\' 8"  (1.727 m)   Wt 247 lb (112 kg)   BMI 37.56 kg/m      Assessment & Plan:  1. Acute bronchitis with COPD ([place]) - Take meds as prescribed - Use a cool mist humidifier  -Use saline nose sprays frequently -Saline irrigations of the nose can be very helpful if done frequently.  * 4X daily for 1 week*  * Use of a nettie pot can be helpful with this. Follow directions with this* -Force fluids -For any cough or congestion  Use plain Mucinex- regular strength or max strength is fine -Throat lozenges if help - predniSONE (DELTASONE) 20 MG tablet; Take 3 tabs daily for 1 week, then 2 tabs for 1 week, then 1 tab for one week  Dispense: 42 tablet; Refill: 0  2. Current smoker -Smoking cessation discussed  Gave pt sample of Trelegy to use instead of Breo to see if this helps with SOB    Evelina Dun, FNP

## 2016-06-22 NOTE — Patient Instructions (Signed)

## 2016-06-23 ENCOUNTER — Telehealth: Payer: Self-pay | Admitting: Family

## 2016-06-23 MED ORDER — AZITHROMYCIN 250 MG PO TABS: ORAL_TABLET | ORAL | 0 refills | Status: DC

## 2016-06-23 NOTE — Telephone Encounter (Signed)
Zpak Prescription sent to pharmacy   

## 2016-06-23 NOTE — Telephone Encounter (Signed)
Patient aware.

## 2016-06-23 NOTE — Telephone Encounter (Signed)
What symptoms do you have? Coughing up yellow/green stuff. Wants a zpack  How long have you been sick? Two days  Have you been seen for this problem? yesterday  If your provider decides to give you a prescription, which pharmacy would you like for it to be sent to? [facility] in [place].   Patient informed that this information will be sent to the clinical staff for review and that they should receive a follow up call.

## 2016-06-23 NOTE — Telephone Encounter (Signed)
Please review and advise.

## 2016-07-15 ENCOUNTER — Other Ambulatory Visit: Payer: Self-pay | Admitting: Family

## 2016-07-15 DIAGNOSIS — H9313 Tinnitus, bilateral: Secondary | ICD-10-CM

## 2016-07-15 DIAGNOSIS — J309 Allergic rhinitis, unspecified: Secondary | ICD-10-CM

## 2016-07-18 ENCOUNTER — Telehealth: Payer: Self-pay | Admitting: Family

## 2016-07-20 ENCOUNTER — Encounter: Payer: Self-pay | Admitting: Family

## 2016-07-20 ENCOUNTER — Other Ambulatory Visit: Payer: Self-pay | Admitting: Family

## 2016-07-20 ENCOUNTER — Ambulatory Visit (INDEPENDENT_AMBULATORY_CARE_PROVIDER_SITE_OTHER): Payer: Medicare Other | Admitting: Family

## 2016-07-20 VITALS — BP 148/79 | HR 57 | Temp 97.9°F | Ht 68.0 in | Wt 242.0 lb

## 2016-07-20 DIAGNOSIS — B370 Candidal stomatitis: Secondary | ICD-10-CM

## 2016-07-20 DIAGNOSIS — I2510 Atherosclerotic heart disease of native coronary artery without angina pectoris: Secondary | ICD-10-CM | POA: Diagnosis not present

## 2016-07-20 DIAGNOSIS — J42 Unspecified chronic bronchitis: Secondary | ICD-10-CM

## 2016-07-20 DIAGNOSIS — I2584 Coronary atherosclerosis due to calcified coronary lesion: Secondary | ICD-10-CM | POA: Diagnosis not present

## 2016-07-20 MED ORDER — CLOTRIMAZOLE 10 MG MT TROC: 10.0000 mg | Freq: Every day | OROMUCOSAL | 0 refills | Status: DC

## 2016-07-20 MED ORDER — MAGIC MOUTHWASH W/LIDOCAINE: 5.0000 mL | Freq: Three times a day (TID) | ORAL | 1 refills | Status: DC | PRN

## 2016-07-20 MED ORDER — FLUTICASONE FUROATE-VILANTEROL 100-25 MCG/INH IN AEPB
1.0000 | INHALATION_SPRAY | Freq: Every day | RESPIRATORY_TRACT | 0 refills | Status: DC
Start: 2016-07-20 — End: 2017-03-16

## 2016-07-20 NOTE — Progress Notes (Signed)
   Subjective:    Patient ID: Matthew [name], male    DOB: 1960/07/31, 56 y.o.   MRN: [id number]  HPI Pt presents to the office today with oral lesions and thrush. PT has had this before and used magic mouth wash that helps. Pt is currently taking Breo.    Review of Systems  HENT:       Oral lesions   Respiratory: Positive for cough.   All other systems reviewed and are negative.      Objective:   Physical Exam  Constitutional: He is oriented to person, place, and time. He appears well-developed and well-nourished. No distress.  HENT:  Head: Normocephalic.  Right Ear: External ear normal.  Left Ear: External ear normal.  Mouth/Throat: He does not have dentures. Oral lesions (on gums and bilateral cheeks) present. Abnormal dentition (no teeth present). No dental caries.  Eyes: Pupils are equal, round, and reactive to light. Right eye exhibits no discharge. Left eye exhibits no discharge.  Neck: Normal range of motion. Neck supple. No thyromegaly present.  Cardiovascular: Normal rate, regular rhythm, normal heart sounds and intact distal pulses.   No murmur heard. Pulmonary/Chest: Effort normal. No respiratory distress. He has decreased breath sounds. He has rhonchi.  Abdominal: Soft. Bowel sounds are normal. He exhibits no distension. There is no tenderness.  Musculoskeletal: Normal range of motion. He exhibits no edema or tenderness.  Neurological: He is alert and oriented to person, place, and time.  Skin: Skin is warm and dry. No rash noted. No erythema.  Psychiatric: He has a normal mood and affect. His behavior is normal. Judgment and thought content normal.  Vitals reviewed.   BP (!) 148/79   Pulse (!) 57   Temp 97.9 F (36.6 C) (Oral)   Ht 5\' 8"  (1.727 m)   Wt 242 lb (109.8 kg)   BMI 36.80 kg/m        Assessment & Plan:  1. Chronic bronchitis, unspecified chronic bronchitis type (HCC) - fluticasone furoate-vilanterol (BREO ELLIPTA) 100-25 MCG/INH AEPB; Inhale 1  puff into the lungs daily.  Dispense: 180 each; Refill: 0  2. Oral thrush Swish and spit after Breo Probiotic Keep clean and dry - magic mouthwash w/lidocaine SOLN; Take 5 mLs by mouth 3 (three) times daily as needed for mouth pain.  Dispense: 250 mL; Refill: 1 - clotrimazole (MYCELEX) 10 MG troche; Take 1 tablet (10 mg total) by mouth 5 (five) times daily.  Dispense: 75 tablet; Refill: 0   Evelina Dun, FNP

## 2016-07-20 NOTE — Patient Instructions (Signed)
Oral Thrush, Adult Oral thrush, also called oral candidiasis, is a fungal infection that develops in the mouth and throat and on the tongue. It causes white patches to form on the mouth and tongue. Thrush is most common in older adults, but it can occur at any age. Many cases of thrush are mild, but this infection can also be serious. Thrush can be a repeated (recurrent) problem for certain people who have a weak body defense system (immune system). The weakness can be caused by chronic illnesses, or by taking medicines that limit the body's ability to fight infection. If a person has difficulty fighting infection, the fungus that causes thrush can spread through the body. This can cause life-threatening blood or organ infections. What are the causes? This condition is caused by a fungus (yeast) called Candida albicans.  This fungus is normally present in small amounts in the mouth and on other mucous membranes. It usually causes no harm.  If conditions are present that allow the fungus to grow without control, it invades surrounding tissues and becomes an infection.  Other Candida species can also lead to thrush (rare).  What increases the risk? This condition is more likely to develop in:  People with a weakened immune system.  Older adults.  People with HIV (human immunodeficiency virus).  People with diabetes.  People with dry mouth (xerostomia).  Pregnant women.  People with poor dental care, especially people who have false teeth.  People who use antibiotic medicines.  What are the signs or symptoms? Symptoms of this condition can vary from mild and moderate to severe and persistent. Symptoms may include:  A burning feeling in the mouth and throat. This can occur at the start of a thrush infection.  White patches that stick to the mouth and tongue. The tissue around the patches may be red, raw, and painful. If rubbed (during tooth brushing, for example), the patches and the  tissue of the mouth may bleed easily.  A bad taste in the mouth or difficulty tasting foods.  A cottony feeling in the mouth.  Pain during eating and swallowing.  Poor appetite.  Cracking at the corners of the mouth.  How is this diagnosed? This condition is diagnosed based on:  Physical exam. Your health care provider will look in your mouth.  Health history. Your health care provider will ask you questions about your health.  How is this treated? This condition is treated with medicines called antifungals, which prevent the growth of fungi. These medicines are either applied directly to the affected area (topical) or swallowed (oral). The treatment will depend on the severity of the condition. Mild thrush Mild cases of thrush may clear up with the use of an antifungal mouth rinse or lozenges. Treatment usually lasts about 14 days. Moderate to severe thrush  More severe thrush infections that have spread to the esophagus are treated with an oral antifungal medicine. A topical antifungal medicine may also be used.  For some severe infections, treatment may need to continue for more than 14 days.  Oral antifungal medicines are rarely used during pregnancy because they may be harmful to the unborn child. If you are pregnant, talk with your health care provider about options for treatment. Persistent or recurrent thrush For cases of thrush that do not go away or keep coming back:  Treatment may be needed twice as long as the symptoms last.  Treatment will include both oral and topical antifungal medicines.  People with a weakened immune   system can take an antifungal medicine on a continuous basis to prevent thrush infections.  It is important to treat conditions that make a person more likely to get thrush, such as diabetes or HIV. Follow these instructions at home: Medicines  Take over-the-counter and prescription medicines only as told by your health care provider.  Talk  with your health care provider about an over-the-counter medicine called gentian violet, which kills bacteria and fungi. Relieving soreness and discomfort To help reduce the discomfort of thrush:  Drink cold liquids such as water or iced tea.  Try flavored ice treats or frozen juices.  Eat foods that are easy to swallow, such as gelatin, ice cream, or custard.  Try drinking from a straw if the patches in your mouth are painful.  General instructions  Eat plain, unflavored yogurt as directed by your health care provider. Check the label to make sure the yogurt contains live cultures. This yogurt can help healthy bacteria to grow in the mouth and can stop the growth of the fungus that causes thrush.  If you wear dentures, remove the dentures before going to bed, brush them vigorously, and soak them in a cleaning solution as directed by your health care provider.  Rinse your mouth with a warm salt-water mixture several times a day. To make a salt-water mixture, completely dissolve 1/2-1 tsp of salt in 1 cup of warm water. Contact a health care provider if:  Your symptoms are getting worse or are not improving within 7 days of starting treatment.  You have symptoms of a spreading infection, such as white patches on the skin outside of the mouth. This information is not intended to replace advice given to you by your health care provider. Make sure you discuss any questions you have with your health care provider. Document Released: 10/26/2003 Document Revised: 10/25/2015 Document Reviewed: 10/25/2015 Elsevier Interactive Patient Education  2017 [employer]  

## 2016-07-31 ENCOUNTER — Other Ambulatory Visit: Payer: Self-pay | Admitting: Family

## 2016-07-31 DIAGNOSIS — F411 Generalized anxiety disorder: Secondary | ICD-10-CM

## 2016-07-31 DIAGNOSIS — F4323 Adjustment disorder with mixed anxiety and depressed mood: Secondary | ICD-10-CM

## 2016-08-01 NOTE — Telephone Encounter (Signed)
[facility] 

## 2016-08-01 NOTE — Telephone Encounter (Signed)
Last filled 06/27/16, last seen 06/01/16. Route to pool for call in

## 2016-08-31 ENCOUNTER — Ambulatory Visit: Payer: Medicare Other | Admitting: Family

## 2016-09-22 ENCOUNTER — Other Ambulatory Visit: Payer: Self-pay | Admitting: Family

## 2016-09-22 DIAGNOSIS — E785 Hyperlipidemia, unspecified: Secondary | ICD-10-CM

## 2016-10-05 ENCOUNTER — Encounter: Payer: Self-pay | Admitting: *Deleted

## 2016-10-11 ENCOUNTER — Other Ambulatory Visit: Payer: Self-pay | Admitting: Family

## 2016-10-16 ENCOUNTER — Other Ambulatory Visit: Payer: Self-pay | Admitting: Family

## 2016-10-16 DIAGNOSIS — J42 Unspecified chronic bronchitis: Secondary | ICD-10-CM

## 2016-10-16 DIAGNOSIS — R0602 Shortness of breath: Secondary | ICD-10-CM

## 2016-10-16 DIAGNOSIS — R062 Wheezing: Secondary | ICD-10-CM

## 2016-10-19 ENCOUNTER — Ambulatory Visit: Payer: Medicare Other | Admitting: Family

## 2016-10-20 ENCOUNTER — Ambulatory Visit: Payer: Medicare Other | Admitting: Family

## 2016-10-21 ENCOUNTER — Encounter: Payer: Self-pay | Admitting: Family

## 2016-10-26 ENCOUNTER — Ambulatory Visit (INDEPENDENT_AMBULATORY_CARE_PROVIDER_SITE_OTHER): Payer: Medicare Other | Admitting: Family

## 2016-10-26 ENCOUNTER — Encounter: Payer: Self-pay | Admitting: Family

## 2016-10-26 ENCOUNTER — Encounter: Payer: Self-pay | Admitting: Neurology

## 2016-10-26 VITALS — BP 146/80 | HR 56 | Temp 97.6°F | Ht 68.0 in | Wt 236.0 lb

## 2016-10-26 DIAGNOSIS — F4323 Adjustment disorder with mixed anxiety and depressed mood: Secondary | ICD-10-CM

## 2016-10-26 DIAGNOSIS — I700 Atherosclerosis of aorta: Secondary | ICD-10-CM

## 2016-10-26 DIAGNOSIS — E782 Mixed hyperlipidemia: Secondary | ICD-10-CM | POA: Diagnosis not present

## 2016-10-26 DIAGNOSIS — I10 Essential (primary) hypertension: Secondary | ICD-10-CM

## 2016-10-26 DIAGNOSIS — G500 Trigeminal neuralgia: Secondary | ICD-10-CM

## 2016-10-26 DIAGNOSIS — I2584 Coronary atherosclerosis due to calcified coronary lesion: Secondary | ICD-10-CM | POA: Diagnosis not present

## 2016-10-26 DIAGNOSIS — I2510 Atherosclerotic heart disease of native coronary artery without angina pectoris: Secondary | ICD-10-CM | POA: Diagnosis not present

## 2016-10-26 DIAGNOSIS — F411 Generalized anxiety disorder: Secondary | ICD-10-CM | POA: Diagnosis not present

## 2016-10-26 DIAGNOSIS — F17200 Nicotine dependence, unspecified, uncomplicated: Secondary | ICD-10-CM

## 2016-10-26 DIAGNOSIS — Z6841 Body Mass Index (BMI) 40.0 and over, adult: Secondary | ICD-10-CM | POA: Diagnosis not present

## 2016-10-26 DIAGNOSIS — J42 Unspecified chronic bronchitis: Secondary | ICD-10-CM

## 2016-10-26 DIAGNOSIS — Z1211 Encounter for screening for malignant neoplasm of colon: Secondary | ICD-10-CM | POA: Diagnosis not present

## 2016-10-26 DIAGNOSIS — Z79891 Long term (current) use of opiate analgesic: Secondary | ICD-10-CM | POA: Diagnosis not present

## 2016-10-26 LAB — LIPID PANEL
CHOL/HDL RATIO: 4.4 ratio (ref 0.0–5.0)
Cholesterol, Total: 189 mg/dL (ref 100–199)
HDL: 43 mg/dL (ref >39)
LDL Calculated: 104 mg/dL — ABNORMAL HIGH (ref 0–99)
TRIGLYCERIDES: 209 mg/dL — AB (ref 0–149)
VLDL Cholesterol Cal: 42 mg/dL — ABNORMAL HIGH (ref 5–40)

## 2016-10-26 LAB — CMP14+EGFR
A/G RATIO: 1.9 (ref 1.2–2.2)
ALBUMIN: 4.2 g/dL (ref 3.5–5.5)
ALK PHOS: 93 IU/L (ref 39–117)
ALT: 18 IU/L (ref 0–44)
AST: 17 IU/L (ref 0–40)
BUN / CREAT RATIO: 15 (ref 9–20)
BUN: 8 mg/dL (ref 6–24)
Bilirubin Total: <0.2 mg/dL (ref 0.0–1.2)
CALCIUM: 8.8 mg/dL (ref 8.7–10.2)
CO2: 23 mmol/L (ref 20–29)
Chloride: 97 mmol/L (ref 96–106)
Creatinine, Ser: 0.54 mg/dL — ABNORMAL LOW (ref 0.76–1.27)
GFR calc Af Amer: 135 mL/min/{1.73_m2} (ref >59)
GFR, EST NON AFRICAN AMERICAN: 117 mL/min/{1.73_m2} (ref >59)
Globulin, Total: 2.2 g/dL (ref 1.5–4.5)
Glucose: 124 mg/dL — ABNORMAL HIGH (ref 65–99)
POTASSIUM: 3.6 mmol/L (ref 3.5–5.2)
Sodium: 134 mmol/L (ref 134–144)
Total Protein: 6.4 g/dL (ref 6.0–8.5)

## 2016-10-26 MED ORDER — GABAPENTIN 600 MG PO TABS: 600.0000 mg | ORAL_TABLET | Freq: Three times a day (TID) | ORAL | 1 refills | Status: DC

## 2016-10-26 MED ORDER — ALPRAZOLAM 0.5 MG PO TABS: 0.5000 mg | ORAL_TABLET | Freq: Two times a day (BID) | ORAL | 3 refills | Status: DC | PRN

## 2016-10-26 NOTE — Progress Notes (Signed)
Subjective:    Patient ID: Matthew [name], male    DOB: [birth year], 56 y.o.   MRN: [id number]  Pt presents to the office today with chronic follow up. PT states he has trigeminal neuralgia and has seen neurologists in the past and was told he would need surgery. PT states he has intermittent pain on his right face that radiates down his jaw of 10 out 10. Pt taking Tegretol and gabapentin TID.  Hypertension  This is a chronic problem. The current episode started more than 1 year ago. The problem has been waxing and waning since onset. The problem is uncontrolled. Associated symptoms include anxiety. Pertinent negatives include no peripheral edema or shortness of breath. Risk factors for coronary artery disease include obesity and dyslipidemia. The current treatment provides moderate improvement. Hypertensive end-organ damage includes CAD/MI.  Hyperlipidemia  This is a chronic problem. The current episode started more than 1 year ago. The problem is controlled. Exacerbating diseases include obesity. Pertinent negatives include no shortness of breath. Current antihyperlipidemic treatment includes statins. The current treatment provides moderate improvement of lipids. Risk factors for coronary artery disease include dyslipidemia, obesity, male sex and a sedentary lifestyle (Pt has CAD and thoracic aortic atherosclerosis, pt states he is taking Lipitor).  Anxiety  Presents for follow-up visit. Symptoms include excessive worry, irritability and nervous/anxious behavior. Patient reports no shortness of breath. Symptoms occur occasionally.    COPD PT currently smoking a pack a day. Pt uses Breo daily.      Review of Systems  Constitutional: Positive for irritability.  Respiratory: Negative for shortness of breath.   Musculoskeletal: Positive for arthralgias.  Psychiatric/Behavioral: The patient is nervous/anxious.   All other systems reviewed and are negative.      Objective:   Physical Exam    Constitutional: He is oriented to person, place, and time. He appears well-developed and well-nourished. No distress.  HENT:  Head: Normocephalic.  Right Ear: External ear normal.  Left Ear: External ear normal.  Mouth/Throat: Oropharynx is clear and moist. Abnormal dentition.  Eyes: Pupils are equal, round, and reactive to light. Right eye exhibits no discharge. Left eye exhibits no discharge.  Neck: Normal range of motion. Neck supple. No thyromegaly present.  Cardiovascular: Normal rate, regular rhythm, normal heart sounds and intact distal pulses.   No murmur heard. Pulmonary/Chest: Effort normal. No respiratory distress. He has wheezes. He has rhonchi.  Abdominal: Soft. Bowel sounds are normal. He exhibits no distension. There is no tenderness.  Musculoskeletal: Normal range of motion. He exhibits no edema or tenderness.  Neurological: He is alert and oriented to person, place, and time. He has normal reflexes. No cranial nerve deficit.  Skin: Skin is warm and dry. No rash noted. No erythema.  Psychiatric: He has a normal mood and affect. His behavior is normal. Judgment and thought content normal.  Vitals reviewed.    BP (!) 146/80   Pulse (!) 56   Temp 97.6 F (36.4 C) (Oral)   Ht _0  (1.727 m)   Wt 236 lb (107 kg)   BMI 35.88 kg/m      Assessment & Plan:  1. Essential hypertension - CMP14+EGFR  2. Chronic bronchitis, unspecified chronic bronchitis type ([place]) - CMP14+EGFR  3. Trigeminal neuralgia Will increase gabapentin to 600 mg TID from 300 gm TID Referral placed - CMP14+EGFR - gabapentin (NEURONTIN) 600 MG tablet; Take 1 tablet (600 mg total) by mouth 3 (three) times daily.  Dispense: 90 tablet; Refill: 1 - Ambulatory  referral to Neurology  4. Smoker Smoking cessation discussed - CMP14+EGFR  5. Morbid obesity with BMI of 40.0-44.9, adult (HCC) - CMP14+EGFR  6. Mixed hyperlipidemia - CMP14+EGFR - Lipid panel  7. Adjustment disorder with mixed  anxiety and depressed mood - CMP14+EGFR - ToxASSURE Select 13 (MW), Urine - ALPRAZolam (XANAX) 0.5 MG tablet; Take 1 tablet (0.5 mg total) by mouth 2 (two) times daily as needed.  Dispense: 60 tablet; Refill: 3  8. Coronary artery calcification - CMP14+EGFR - Lipid panel  9. Thoracic aortic atherosclerosis (HCC) - CMP14+EGFR - Lipid panel  10. Colon cancer screening  - CMP14+EGFR - Fecal occult blood, imunochemical; Future  11. GAD (generalized anxiety disorder) - ToxASSURE Select 13 (MW), Urine - ALPRAZolam (XANAX) 0.5 MG tablet; Take 1 tablet (0.5 mg total) by mouth 2 (two) times daily as needed.  Dispense: 60 tablet; Refill: 3  Pt reviewed in [place] controlled database. Pt has only received controlled medication from me.   Continue all meds Labs pending Health Maintenance reviewed Diet and exercise encouraged RTO 3 months   Evelina Dun, FNP

## 2016-10-26 NOTE — Patient Instructions (Signed)
Trigeminal Neuralgia Trigeminal neuralgia is a nerve disorder that causes attacks of severe facial pain. The attacks last from a few seconds to several minutes. They can happen for days, weeks, or months and then go away for months or years. Trigeminal neuralgia is also called tic douloureux. What are the causes? This condition is caused by damage to a nerve in the face that is called the trigeminal nerve. An attack can be triggered by:  Talking.  Chewing.  Putting on makeup.  Washing your face.  Shaving your face.  Brushing your teeth.  Touching your face.  What increases the risk? This condition is more likely to develop in:  Women.  People who are 50 years of age or older.  What are the signs or symptoms? The main symptom of this condition is pain in the jaw, lips, eyes, nose, scalp, forehead, and face. The pain may be intense, stabbing, electric, or shock-like. How is this diagnosed? This condition is diagnosed with a physical exam. A CT scan or MRI may be done to rule out other conditions that can cause facial pain. How is this treated? This condition may be treated with:  Avoiding the things that trigger your attacks.  Pain medicine.  Surgery. This may be done in severe cases if other medical treatment does not provide relief.  Follow these instructions at home:  Take over-the-counter and prescription medicines only as told by your health care provider.  If you wish to get pregnant, talk with your health care provider before you start trying to get pregnant.  Avoid the things that trigger your attacks. It may help to: ? Chew on the unaffected side of your mouth. ? Avoid touching your face. ? Avoid blasts of hot or cold air. Contact a health care provider if:  Your pain medicine is not helping.  You develop new, unexplained symptoms, such as: ? Double vision. ? Facial weakness. ? Changes in hearing or balance.  You become pregnant. Get help right away  if:  Your pain is unbearable, and your pain medicine does not help. This information is not intended to replace advice given to you by your health care provider. Make sure you discuss any questions you have with your health care provider. Document Released: 01/28/2000 Document Revised: 10/03/2015 Document Reviewed: 05/25/2014 Elsevier Interactive Patient Education  2018 [employer]  

## 2016-11-01 LAB — TOXASSURE SELECT 13 (MW), URINE

## 2016-11-02 ENCOUNTER — Other Ambulatory Visit: Payer: Self-pay | Admitting: Family

## 2016-11-02 ENCOUNTER — Telehealth: Payer: Self-pay | Admitting: Family

## 2016-11-02 DIAGNOSIS — R892 Abnormal level of other drugs, medicaments and biological substances in specimens from other organs, systems and tissues: Secondary | ICD-10-CM | POA: Insufficient documentation

## 2016-11-02 NOTE — Telephone Encounter (Signed)
Spoke to pt and advised he had unexpected Benzo's in his urine drug screen which means he was taking controlled medications that we aren't prescribing so we can't continue to prescribe his xanax. Advised pt we could still see him for chronic follow up but not xanax. Pt voiced understanding.

## 2016-11-07 ENCOUNTER — Other Ambulatory Visit: Payer: Self-pay | Admitting: Family

## 2016-11-07 DIAGNOSIS — I10 Essential (primary) hypertension: Secondary | ICD-10-CM

## 2016-12-25 ENCOUNTER — Ambulatory Visit (INDEPENDENT_AMBULATORY_CARE_PROVIDER_SITE_OTHER): Payer: Medicare Other | Admitting: Family

## 2016-12-25 ENCOUNTER — Encounter: Payer: Self-pay | Admitting: Family

## 2016-12-25 VITALS — BP 177/93 | HR 57 | Temp 97.1°F | Ht 68.0 in | Wt 234.2 lb

## 2016-12-25 DIAGNOSIS — F17200 Nicotine dependence, unspecified, uncomplicated: Secondary | ICD-10-CM

## 2016-12-25 DIAGNOSIS — L858 Other specified epidermal thickening: Secondary | ICD-10-CM | POA: Diagnosis not present

## 2016-12-25 DIAGNOSIS — I2584 Coronary atherosclerosis due to calcified coronary lesion: Secondary | ICD-10-CM

## 2016-12-25 DIAGNOSIS — I2510 Atherosclerotic heart disease of native coronary artery without angina pectoris: Secondary | ICD-10-CM

## 2016-12-25 DIAGNOSIS — L989 Disorder of the skin and subcutaneous tissue, unspecified: Secondary | ICD-10-CM

## 2016-12-25 DIAGNOSIS — F331 Major depressive disorder, recurrent, moderate: Secondary | ICD-10-CM

## 2016-12-25 DIAGNOSIS — F329 Major depressive disorder, single episode, unspecified: Secondary | ICD-10-CM | POA: Insufficient documentation

## 2016-12-25 DIAGNOSIS — F32A Depression, unspecified: Secondary | ICD-10-CM | POA: Insufficient documentation

## 2016-12-25 MED ORDER — ESCITALOPRAM OXALATE 20 MG PO TABS: 20.0000 mg | ORAL_TABLET | Freq: Every day | ORAL | 5 refills | Status: DC

## 2016-12-25 NOTE — Patient Instructions (Signed)
Major Depressive Disorder, Adult Major depressive disorder (MDD) is a mental health condition. It may also be called clinical depression or unipolar depression. MDD usually causes feelings of sadness, hopelessness, or helplessness. MDD can also cause physical symptoms. It can interfere with work, school, relationships, and other everyday activities. MDD may be mild, moderate, or severe. It may occur once (single episode major depressive disorder) or it may occur multiple times (recurrent major depressive disorder). What are the causes? The exact cause of this condition is not known. MDD is most likely caused by a combination of things, which may include:  Genetic factors. These are traits that are passed along from parent to child.  Individual factors. Your personality, your behavior, and the way you handle your thoughts and feelings may contribute to MDD. This includes personality traits and behaviors learned from others.  Physical factors, such as: ? Differences in the part of your brain that controls emotion. This part of your brain may be different than it is in people who do not have MDD. ? Long-term (chronic) medical or psychiatric illnesses.  Social factors. Traumatic experiences or major life changes may play a role in the development of MDD.  What increases the risk? This condition is more likely to develop in women. The following factors may also make you more likely to develop MDD:  A family history of depression.  Troubled family relationships.  Abnormally low levels of certain brain chemicals.  Traumatic events in childhood, especially abuse or the loss of a parent.  Being under a lot of stress, or long-term stress, especially from upsetting life experiences or losses.  A history of: ? Chronic physical illness. ? Other mental health disorders. ? Substance abuse.  Poor living conditions.  Experiencing social exclusion or discrimination on a regular basis.  What are  the signs or symptoms? The main symptoms of MDD typically include:  Constant depressed or irritable mood.  Loss of interest in things and activities.  MDD symptoms may also include:  Sleeping or eating too much or too little.  Unexplained weight change.  Fatigue or low energy.  Feelings of worthlessness or guilt.  Difficulty thinking clearly or making decisions.  Thoughts of suicide or of harming others.  Physical agitation or weakness.  Isolation.  Severe cases of MDD may also occur with other symptoms, such as:  Delusions or hallucinations, in which you imagine things that are not real (psychotic depression).  Low-level depression that lasts at least a year (chronic depression or persistent depressive disorder).  Extreme sadness and hopelessness (melancholic depression).  Trouble speaking and moving (catatonic depression).  How is this diagnosed? This condition may be diagnosed based on:  Your symptoms.  Your medical history, including your mental health history. This may involve tests to evaluate your mental health. You may be asked questions about your lifestyle, including any drug and alcohol use, and how long you have had symptoms of MDD.  A physical exam.  Blood tests to rule out other conditions.  You must have a depressed mood and at least four other MDD symptoms most of the day, nearly every day in the same 2-week timeframe before your health care provider can confirm a diagnosis of MDD. How is this treated? This condition is usually treated by mental health professionals, such as psychologists, psychiatrists, and clinical social workers. You may need more than one type of treatment. Treatment may include:  Psychotherapy. This is also called talk therapy or counseling. Types of psychotherapy include: ? Cognitive behavioral   therapy (CBT). This type of therapy teaches you to recognize unhealthy feelings, thoughts, and behaviors, and replace them with  positive thoughts and actions. ? Interpersonal therapy (IPT). This helps you to improve the way you relate to and communicate with others. ? Family therapy. This treatment includes members of your family.  Medicine to treat anxiety and depression, or to help you control certain emotions and behaviors.  Lifestyle changes, such as: ? Limiting alcohol and drug use. ? Exercising regularly. ? Getting plenty of sleep. ? Making healthy eating choices. ? Spending more time outdoors.  Treatments involving stimulation of the brain can be used in situations with extremely severe symptoms, or when medicine or other therapies do not work over time. These treatments include electroconvulsive therapy, transcranial magnetic stimulation, and vagal nerve stimulation. Follow these instructions at home: Activity  Return to your normal activities as told by your health care provider.  Exercise regularly and spend time outdoors as told by your health care provider. General instructions  Take over-the-counter and prescription medicines only as told by your health care provider.  Do not drink alcohol. If you drink alcohol, limit your alcohol intake to no more than 1 drink a day for nonpregnant women and 2 drinks a day for men. One drink equals 12 oz of beer, 5 oz of wine, or 1 oz of hard liquor. Alcohol can affect any antidepressant medicines you are taking. Talk to your health care provider about your alcohol use.  Eat a healthy diet and get plenty of sleep.  Find activities that you enjoy doing, and make time to do them.  Consider joining a support group. Your health care provider may be able to recommend a support group.  Keep all follow-up visits as told by your health care provider. This is important. Where to find more information: National Alliance on Mental Illness  www.nami.org  U.S. National Institute of Mental Health  www.nimh.nih.gov  National Suicide Prevention  Lifeline  1-800-273-TALK (8255). This is free, 24-hour help.  Contact a health care provider if:  Your symptoms get worse.  You develop new symptoms. Get help right away if:  You self-harm.  You have serious thoughts about hurting yourself or others.  You see, hear, taste, smell, or feel things that are not present (hallucinate). This information is not intended to replace advice given to you by your health care provider. Make sure you discuss any questions you have with your health care provider. Document Released: 05/27/2012 Document Revised: 10/07/2015 Document Reviewed: 08/11/2015 Elsevier Interactive Patient Education  2017 Elsevier Inc.  

## 2016-12-25 NOTE — Progress Notes (Signed)
   Subjective:    Patient ID: Matthew [name], male    DOB: 01-18-1961, 56 y.o.   MRN: [id number]  PT presents to the office today with sore on bilateral hands that are not healing. Pt states he noticed two weeks ago. He denies any pain, discharge, redness.   Pt states he has been cutting wood over the last few weeks. He has tried antibiotic ointment and peroxide with no relief.  Depression         This is a chronic problem.  The current episode started more than 1 year ago.   The onset quality is gradual.   The problem occurs intermittently.The problem is unchanged.  Associated symptoms include helplessness, hopelessness, irritable, restlessness, decreased interest and sad.  Past treatments include nothing.  Compliance with treatment is good.     Review of Systems  Psychiatric/Behavioral: Positive for depression.       Objective:   Physical Exam  Constitutional: He is oriented to person, place, and time. He appears well-developed and well-nourished. He is irritable. No distress.  HENT:  Head: Normocephalic.  Eyes: Pupils are equal, round, and reactive to light. Right eye exhibits no discharge. Left eye exhibits no discharge.  Neck: Normal range of motion. Neck supple. No thyromegaly present.  Cardiovascular: Normal rate, regular rhythm, normal heart sounds and intact distal pulses.  No murmur heard. Pulmonary/Chest: Effort normal and breath sounds normal. No respiratory distress. He has no wheezes.  Abdominal: Soft. Bowel sounds are normal. He exhibits no distension. There is no tenderness.  Musculoskeletal: Normal range of motion. He exhibits no edema or tenderness.  Neurological: He is alert and oriented to person, place, and time. He has normal reflexes. No cranial nerve deficit.  Skin: Skin is warm and dry. Lesion (dome like lesion on bilateral medial hands with crusted middle, left- 2.4X2.2, right 1.6X1.5 ) noted. No rash noted. No erythema.  Psychiatric: He has a normal mood and  affect. His behavior is normal. Judgment and thought content normal.  Vitals reviewed.         BP (!) 177/93   Pulse (!) 57   Temp (!) 97.1 F (36.2 C) (Oral)   Ht 5\' 8"  (1.727 m)   Wt 234 lb 3.2 oz (106.2 kg)   BMI 35.61 kg/m      Assessment & Plan:  1. Keratoacanthoma of hand -Do not pick or squeeze  Follow up with Derm to have removed and biopsy to rule out low grade squamous  - Ambulatory referral to Dermatology  2. Skin lesion - Ambulatory referral to Dermatology  3. Smoker Smoking cessation discussed   4. Moderate episode of recurrent major depressive disorder (HCC) Lexapro increased to 20 mg  Stress management discussed RTO in 6 weeks - escitalopram (LEXAPRO) 20 MG tablet; Take 1 tablet (20 mg total) daily by mouth.  Dispense: 30 tablet; Refill: [place], FNP

## 2016-12-31 ENCOUNTER — Other Ambulatory Visit: Payer: Self-pay | Admitting: Family

## 2016-12-31 DIAGNOSIS — G500 Trigeminal neuralgia: Secondary | ICD-10-CM

## 2017-01-16 ENCOUNTER — Encounter: Payer: Self-pay | Admitting: Family

## 2017-01-16 ENCOUNTER — Ambulatory Visit (INDEPENDENT_AMBULATORY_CARE_PROVIDER_SITE_OTHER): Payer: Medicare Other | Admitting: Family

## 2017-01-16 VITALS — BP 150/84 | HR 59 | Temp 97.2°F | Ht 68.0 in | Wt 224.0 lb

## 2017-01-16 DIAGNOSIS — J441 Chronic obstructive pulmonary disease with (acute) exacerbation: Secondary | ICD-10-CM

## 2017-01-16 DIAGNOSIS — F17200 Nicotine dependence, unspecified, uncomplicated: Secondary | ICD-10-CM | POA: Diagnosis not present

## 2017-01-16 DIAGNOSIS — I2584 Coronary atherosclerosis due to calcified coronary lesion: Secondary | ICD-10-CM

## 2017-01-16 DIAGNOSIS — I2510 Atherosclerotic heart disease of native coronary artery without angina pectoris: Secondary | ICD-10-CM

## 2017-01-16 MED ORDER — PREDNISONE 10 MG (21) PO TBPK: ORAL_TABLET | ORAL | 0 refills | Status: DC

## 2017-01-16 MED ORDER — DOXYCYCLINE HYCLATE 100 MG PO TABS: 100.0000 mg | ORAL_TABLET | Freq: Two times a day (BID) | ORAL | 0 refills | Status: DC

## 2017-01-16 NOTE — Patient Instructions (Signed)

## 2017-01-16 NOTE — Progress Notes (Signed)
   Subjective:    Patient ID: KENNARD FILDES, male    DOB: [birth year], 56 y.o.   MRN: [id number]  Cough  This is a new problem. The current episode started 1 to 4 weeks ago. The problem has been gradually worsening. The problem occurs every few minutes. The cough is productive of purulent sputum and productive of sputum. Associated symptoms include chills, a fever, myalgias, shortness of breath and wheezing. Pertinent negatives include no ear congestion, ear pain, headaches, nasal congestion or sore throat. The symptoms are aggravated by lying down. Risk factors for lung disease include smoking/tobacco exposure. He has tried rest and OTC cough suppressant for the symptoms. The treatment provided mild relief. His past medical history is significant for COPD.      Review of Systems  Constitutional: Positive for chills and fever.  HENT: Negative for ear pain and sore throat.   Respiratory: Positive for cough, shortness of breath and wheezing.   Musculoskeletal: Positive for myalgias.  Neurological: Negative for headaches.  All other systems reviewed and are negative.      Objective:   Physical Exam  Constitutional: He is oriented to person, place, and time. He appears well-developed and well-nourished. No distress.  HENT:  Head: Normocephalic.  Right Ear: External ear normal.  Left Ear: External ear normal.  Nose: Mucosal edema and rhinorrhea present.  Mouth/Throat: Posterior oropharyngeal erythema present.  Eyes: Pupils are equal, round, and reactive to light. Right eye exhibits no discharge. Left eye exhibits no discharge.  Neck: Normal range of motion. Neck supple. No thyromegaly present.  Cardiovascular: Normal rate, regular rhythm, normal heart sounds and intact distal pulses.  No murmur heard. Pulmonary/Chest: Effort normal. No respiratory distress. He has wheezes. He has rhonchi.  Abdominal: Soft. Bowel sounds are normal. He exhibits no distension. There is no tenderness.    Musculoskeletal: Normal range of motion. He exhibits no edema or tenderness.  Neurological: He is alert and oriented to person, place, and time.  Skin: Skin is warm and dry. No rash noted. No erythema.  Psychiatric: He has a normal mood and affect. His behavior is normal. Judgment and thought content normal.  Vitals reviewed.    BP (!) 150/84   Pulse (!) 59   Temp (!) 97.2 F (36.2 C) (Oral)   Ht 5\' 8"  (1.727 m)   Wt 224 lb (101.6 kg)   BMI 34.06 kg/m      Assessment & Plan:  1. Chronic obstructive pulmonary disease with acute exacerbation (HCC) - doxycycline (VIBRA-TABS) 100 MG tablet; Take 1 tablet (100 mg total) by mouth 2 (two) times daily.  Dispense: 20 tablet; Refill: 0 - predniSONE (STERAPRED UNI-PAK 21 TAB) 10 MG (21) TBPK tablet; Use as directed  Dispense: 21 tablet; Refill: 0  2. Smoker - doxycycline (VIBRA-TABS) 100 MG tablet; Take 1 tablet (100 mg total) by mouth 2 (two) times daily.  Dispense: 20 tablet; Refill: 0 - predniSONE (STERAPRED UNI-PAK 21 TAB) 10 MG (21) TBPK tablet; Use as directed  Dispense: 21 tablet; Refill: 0  Stop smoking  Continue Breo RTO prn and keep chronic follow up  Evelina Dun, FNP

## 2017-01-24 ENCOUNTER — Encounter: Payer: Self-pay | Admitting: Neurology

## 2017-01-24 ENCOUNTER — Ambulatory Visit (INDEPENDENT_AMBULATORY_CARE_PROVIDER_SITE_OTHER): Payer: Medicare Other | Admitting: Neurology

## 2017-01-24 VITALS — BP 162/84 | HR 56 | Ht 69.0 in | Wt 222.0 lb

## 2017-01-24 DIAGNOSIS — F17210 Nicotine dependence, cigarettes, uncomplicated: Secondary | ICD-10-CM | POA: Diagnosis not present

## 2017-01-24 DIAGNOSIS — R51 Headache: Secondary | ICD-10-CM

## 2017-01-24 DIAGNOSIS — G500 Trigeminal neuralgia: Secondary | ICD-10-CM

## 2017-01-24 DIAGNOSIS — Z720 Tobacco use: Secondary | ICD-10-CM

## 2017-01-24 DIAGNOSIS — R471 Dysarthria and anarthria: Secondary | ICD-10-CM

## 2017-01-24 DIAGNOSIS — R519 Headache, unspecified: Secondary | ICD-10-CM

## 2017-01-24 MED ORDER — GABAPENTIN 600 MG PO TABS: 600.0000 mg | ORAL_TABLET | Freq: Three times a day (TID) | ORAL | 3 refills | Status: DC

## 2017-01-24 MED ORDER — CARBAMAZEPINE 200 MG PO TABS: ORAL_TABLET | ORAL | 3 refills | Status: DC

## 2017-01-24 NOTE — Patient Instructions (Addendum)
1.  Start carbamazepine 400mg  twice daily 2.  Continue gabapentin 600mg  three times daily 3.  MRI head. We have scheduled you at [facility] for your MRI on 02/09/17 at 2:30 pm. Please arrive 30 minutes prior and go to [facility]. If you need to change this appt please call [phone]. 4.  Encouraged to stop smoking  We will call you with the results  Return to clinic in 4 months
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
## 2017-01-26 ENCOUNTER — Ambulatory Visit: Payer: Medicare Other | Admitting: Family

## 2017-02-09 ENCOUNTER — Ambulatory Visit
Admission: RE | Admit: 2017-02-09 | Discharge: 2017-02-09 | Disposition: A | Discharge status: Home / Self Care | Payer: Medicare Other | Source: Ambulatory Visit | Attending: Neurology | Admitting: Neurology

## 2017-02-09 ENCOUNTER — Telehealth: Payer: Self-pay | Admitting: Neurology

## 2017-02-09 DIAGNOSIS — R519 Headache, unspecified: Secondary | ICD-10-CM

## 2017-02-09 DIAGNOSIS — R471 Dysarthria and anarthria: Secondary | ICD-10-CM

## 2017-02-09 DIAGNOSIS — G500 Trigeminal neuralgia: Secondary | ICD-10-CM

## 2017-02-09 DIAGNOSIS — R51 Headache: Secondary | ICD-10-CM

## 2017-02-09 MED ORDER — DIAZEPAM 5 MG PO TABS: ORAL_TABLET | ORAL | 0 refills | Status: DC

## 2017-02-09 NOTE — Telephone Encounter (Signed)
Yes, let's order valium 5mg  30-min prior to MRI and open MRI.

## 2017-02-09 NOTE — Telephone Encounter (Signed)
Matthew [name] called from [facility]. She said patient could not sit still and relax. Please Advise what he needs to do next. Thanks

## 2017-02-09 NOTE — Telephone Encounter (Signed)
Should we offer valium.  Matthew [name] said that she tried everything to keep him still but nothing would work!

## 2017-02-12 NOTE — Telephone Encounter (Signed)
Patient notified that I will order an open MRI and send in Rx for valium.

## 2017-02-14 ENCOUNTER — Encounter: Payer: Self-pay | Admitting: Family Medicine

## 2017-02-14 ENCOUNTER — Ambulatory Visit (INDEPENDENT_AMBULATORY_CARE_PROVIDER_SITE_OTHER): Payer: Medicare Other | Admitting: Family Medicine

## 2017-02-14 VITALS — BP 119/75 | HR 76 | Temp 98.0°F | Ht 69.0 in | Wt 227.0 lb

## 2017-02-14 DIAGNOSIS — J40 Bronchitis, not specified as acute or chronic: Secondary | ICD-10-CM | POA: Diagnosis not present

## 2017-02-14 DIAGNOSIS — J329 Chronic sinusitis, unspecified: Secondary | ICD-10-CM

## 2017-02-14 MED ORDER — BENZONATATE 200 MG PO CAPS: 200.0000 mg | ORAL_CAPSULE | Freq: Three times a day (TID) | ORAL | 0 refills | Status: DC | PRN

## 2017-02-14 MED ORDER — LEVOFLOXACIN 500 MG PO TABS: 500.0000 mg | ORAL_TABLET | Freq: Every day | ORAL | 0 refills | Status: DC

## 2017-02-14 NOTE — Progress Notes (Signed)
Chief Complaint  Patient presents with  . Cough  . Nasal Congestion    HPI  Patient presents today for Patient presents with upper respiratory congestion. Rhinorrhea that is frequently purulent. There is moderate sore throat. Patient reports coughing frequently as well.  Yellow sputum noted. There is subjective fever. The patient reports moderate shortness of breath. Onset was 10 days ago. Gradually worsening. Tried OTCs without improvement.  PMH: Smoking status noted ROS: Per HPI  Objective: BP 119/75   Pulse 76   Temp 98 F (36.7 C) (Oral)   Ht 5\' 9"  (1.753 m)   Wt 227 lb (103 kg)   SpO2 96%   BMI 33.52 kg/m  Gen: NAD, alert, cooperative with exam HEENT: NCAT, Nasal passages swollen, red TMS RED CV: RRR, good S1/S2, no murmur Resp: Bronchitis changes with scattered wheezes, non-labored Ext: No edema, warm Neuro: Alert and oriented, No gross deficits  Assessment and plan:  1. Sinobronchitis     Meds ordered this encounter  Medications  . levofloxacin (LEVAQUIN) 500 MG tablet    Sig: Take 1 tablet (500 mg total) by mouth daily.    Dispense:  7 tablet    Refill:  0  . benzonatate (TESSALON) 200 MG capsule    Sig: Take 1 capsule (200 mg total) by mouth 3 (three) times daily as needed for cough.    Dispense:  20 capsule    Refill:  0    No orders of the defined types were placed in this encounter.   Follow up as needed.  Claretta Fraise, MD

## 2017-02-19 ENCOUNTER — Encounter: Payer: Self-pay | Admitting: Family Medicine

## 2017-03-12 ENCOUNTER — Other Ambulatory Visit: Payer: Self-pay | Admitting: Family Medicine

## 2017-03-12 ENCOUNTER — Telehealth: Payer: Self-pay | Admitting: Family

## 2017-03-12 DIAGNOSIS — F4323 Adjustment disorder with mixed anxiety and depressed mood: Secondary | ICD-10-CM

## 2017-03-12 DIAGNOSIS — F411 Generalized anxiety disorder: Secondary | ICD-10-CM

## 2017-03-12 NOTE — Telephone Encounter (Signed)
Last seen 02/14/17

## 2017-03-16 ENCOUNTER — Encounter: Payer: Self-pay | Admitting: Family

## 2017-03-16 ENCOUNTER — Ambulatory Visit (INDEPENDENT_AMBULATORY_CARE_PROVIDER_SITE_OTHER): Payer: Medicare Other | Admitting: Family

## 2017-03-16 VITALS — BP 136/79 | HR 63 | Temp 97.4°F | Ht 69.0 in | Wt 227.0 lb

## 2017-03-16 DIAGNOSIS — G894 Chronic pain syndrome: Secondary | ICD-10-CM

## 2017-03-16 DIAGNOSIS — Z1211 Encounter for screening for malignant neoplasm of colon: Secondary | ICD-10-CM

## 2017-03-16 DIAGNOSIS — G500 Trigeminal neuralgia: Secondary | ICD-10-CM | POA: Diagnosis not present

## 2017-03-16 DIAGNOSIS — F17200 Nicotine dependence, unspecified, uncomplicated: Secondary | ICD-10-CM | POA: Diagnosis not present

## 2017-03-16 DIAGNOSIS — J441 Chronic obstructive pulmonary disease with (acute) exacerbation: Secondary | ICD-10-CM | POA: Diagnosis not present

## 2017-03-16 MED ORDER — FLUTICASONE-UMECLIDIN-VILANT 100-62.5-25 MCG/INH IN AEPB
1.0000 | INHALATION_SPRAY | Freq: Every day | RESPIRATORY_TRACT | 3 refills | Status: DC
Start: 2017-03-16 — End: 2017-07-12

## 2017-03-16 NOTE — Patient Instructions (Signed)

## 2017-03-16 NOTE — Progress Notes (Signed)
   Subjective:    Patient ID: Matthew [name], male    DOB: 07/06/1960, 57 y.o.   MRN: [id number]  HPI Pt presents to the office today with complaints of right jaw pain. Pt has trigeminal neuralgia and is followed by Neurologists.    Review of Systems  All other systems reviewed and are negative.      Objective:   Physical Exam  Constitutional: He is oriented to person, place, and time. He appears well-developed and well-nourished. No distress.  HENT:  Head: Normocephalic.  Eyes: Pupils are equal, round, and reactive to light. Right eye exhibits no discharge. Left eye exhibits no discharge.  Neck: Normal range of motion. Neck supple. No thyromegaly present.  Cardiovascular: Normal rate, regular rhythm, normal heart sounds and intact distal pulses.  No murmur heard. Pulmonary/Chest: Effort normal. No respiratory distress. He has wheezes.  Abdominal: Soft. Bowel sounds are normal. He exhibits no distension. There is no tenderness.  Musculoskeletal: Normal range of motion. He exhibits no edema or tenderness.  Neurological: He is alert and oriented to person, place, and time. He has normal reflexes. No cranial nerve deficit.  Skin: Skin is warm and dry. No rash noted. No erythema.  Psychiatric: He has a normal mood and affect. His behavior is normal. Judgment and thought content normal.  Vitals reviewed.   BP 136/79   Pulse 63   Temp (!) 97.4 F (36.3 C)   Ht 5\' 9"  (1.753 m)   Wt 227 lb (103 kg)   BMI 33.52 kg/m       Assessment & Plan:  1. Trigeminal neuralgia Keep neurologists appts and MRI  2. Chronic pain syndrome  3. Chronic obstructive pulmonary disease with acute exacerbation (HCC) Breo changed to Trelegy Smoking cessation discussed - Fluticasone-Umeclidin-Vilant (TRELEGY ELLIPTA) 100-62.5-25 MCG/INH AEPB; Inhale 1 puff into the lungs daily.  Dispense: 1 each; Refill: 3  4. Smoker  5. Colon cancer screening - Fecal occult blood, imunochemical;  Future    Evelina Dun, FNP

## 2017-03-31 ENCOUNTER — Other Ambulatory Visit: Payer: Self-pay | Admitting: Family

## 2017-03-31 DIAGNOSIS — G500 Trigeminal neuralgia: Secondary | ICD-10-CM

## 2017-04-17 ENCOUNTER — Ambulatory Visit: Payer: Medicare Other | Admitting: Family

## 2017-04-19 ENCOUNTER — Other Ambulatory Visit: Payer: Self-pay | Admitting: Family

## 2017-04-19 DIAGNOSIS — R062 Wheezing: Secondary | ICD-10-CM

## 2017-04-19 DIAGNOSIS — J42 Unspecified chronic bronchitis: Secondary | ICD-10-CM

## 2017-04-19 DIAGNOSIS — R0602 Shortness of breath: Secondary | ICD-10-CM

## 2017-04-23 ENCOUNTER — Ambulatory Visit (INDEPENDENT_AMBULATORY_CARE_PROVIDER_SITE_OTHER): Payer: Medicare Other | Admitting: Family

## 2017-04-23 ENCOUNTER — Other Ambulatory Visit: Payer: Self-pay | Admitting: *Deleted

## 2017-04-23 ENCOUNTER — Encounter: Payer: Self-pay | Admitting: Family

## 2017-04-23 VITALS — BP 144/88 | HR 56 | Temp 97.2°F | Ht 69.0 in | Wt 228.0 lb

## 2017-04-23 DIAGNOSIS — F4323 Adjustment disorder with mixed anxiety and depressed mood: Secondary | ICD-10-CM | POA: Diagnosis not present

## 2017-04-23 DIAGNOSIS — H9313 Tinnitus, bilateral: Secondary | ICD-10-CM

## 2017-04-23 DIAGNOSIS — G500 Trigeminal neuralgia: Secondary | ICD-10-CM

## 2017-04-23 DIAGNOSIS — F17200 Nicotine dependence, unspecified, uncomplicated: Secondary | ICD-10-CM

## 2017-04-23 DIAGNOSIS — G894 Chronic pain syndrome: Secondary | ICD-10-CM

## 2017-04-23 DIAGNOSIS — J309 Allergic rhinitis, unspecified: Secondary | ICD-10-CM

## 2017-04-23 DIAGNOSIS — E669 Obesity, unspecified: Secondary | ICD-10-CM | POA: Diagnosis not present

## 2017-04-23 DIAGNOSIS — E782 Mixed hyperlipidemia: Secondary | ICD-10-CM | POA: Diagnosis not present

## 2017-04-23 DIAGNOSIS — J441 Chronic obstructive pulmonary disease with (acute) exacerbation: Secondary | ICD-10-CM | POA: Diagnosis not present

## 2017-04-23 DIAGNOSIS — I10 Essential (primary) hypertension: Secondary | ICD-10-CM

## 2017-04-23 MED ORDER — FLUTICASONE PROPIONATE 50 MCG/ACT NA SUSP: 2.0000 | Freq: Every day | NASAL | 1 refills | Status: DC

## 2017-04-23 NOTE — Progress Notes (Signed)
Subjective:    Patient ID: Matthew [name], male    DOB: 1960/05/29, 57 y.o.   MRN: [id number]  Pt presents to the office today with chronic follow up. PT states he has trigeminal neuralgia and followed by a neurologists.. PT states he has intermittent pain on his right face that radiates down his jaw of 2 out 10. He reports this pain has greatly improved and unsure why, but is taking Tegretol and gabapentin TID.  Hypertension  This is a chronic problem. The current episode started more than 1 year ago. The problem has been waxing and waning since onset. The problem is uncontrolled. Associated symptoms include shortness of breath. Pertinent negatives include no malaise/fatigue or peripheral edema. Risk factors for coronary artery disease include dyslipidemia, obesity, male gender, smoking/tobacco exposure and sedentary lifestyle. Hypertensive end-organ damage includes CAD/MI. There is no history of CVA or heart failure.  Hyperlipidemia  This is a chronic problem. The current episode started more than 1 year ago. The problem is uncontrolled. Recent lipid tests were reviewed and are high. Exacerbating diseases include obesity. Factors aggravating his hyperlipidemia include thiazides. Associated symptoms include shortness of breath. Current antihyperlipidemic treatment includes statins. The current treatment provides moderate improvement of lipids. Risk factors for coronary artery disease include dyslipidemia, hypertension, male sex, obesity and a sedentary lifestyle.  COPD Pt continues to smoke, but unsure how much. Maybe "couple packs" a day. PT reports using a "round inhaler" once a day. He has not started on Trelegy because he was "worried he was going to get addicted".      Review of Systems  Constitutional: Negative for malaise/fatigue.  Respiratory: Positive for shortness of breath.   All other systems reviewed and are negative.      Objective:   Physical Exam  Constitutional: He is oriented  to person, place, and time. He appears well-developed and well-nourished. No distress.  HENT:  Head: Normocephalic.  Right Ear: External ear normal.  Left Ear: External ear normal.  Mouth/Throat: Posterior oropharyngeal erythema present.  Eyes: Pupils are equal, round, and reactive to light. Right eye exhibits no discharge. Left eye exhibits no discharge.  Neck: Normal range of motion. Neck supple. No thyromegaly present.  Cardiovascular: Normal rate, regular rhythm, normal heart sounds and intact distal pulses.  No murmur heard. Pulmonary/Chest: Effort normal. No respiratory distress. He has decreased breath sounds. He has no wheezes.  Abdominal: Soft. Bowel sounds are normal. He exhibits no distension. There is no tenderness.  Musculoskeletal: Normal range of motion. He exhibits no edema or tenderness.  Neurological: He is alert and oriented to person, place, and time. He has normal reflexes. No cranial nerve deficit.  Skin: Skin is warm and dry. No rash noted. No erythema.  Psychiatric: He has a normal mood and affect. His behavior is normal. Judgment and thought content normal.  Vitals reviewed.     BP (!) 144/88   Pulse (!) 56   Temp (!) 97.2 F (36.2 C) (Oral)   Ht '5\' 9"'$  (1.753 m)   Wt 228 lb (103.4 kg)   BMI 33.67 kg/m      Assessment & Plan:  1. Adjustment disorder with mixed anxiety and depressed mood - CMP14+EGFR  2. Trigeminal neuralgia  - CMP14+EGFR  3. Essential hypertension - CMP14+EGFR  4. Chronic pain syndrome - CMP14+EGFR  5. Chronic obstructive pulmonary disease with acute exacerbation (HCC)  - CMP14+EGFR  6. Obesity (BMI 30-39.9)  - CMP14+EGFR  7. Smoker - CMP14+EGFR  8. Mixed  hyperlipidemia - CMP14+EGFR - Lipid panel   Continue all meds Labs pending Health Maintenance reviewed Diet and exercise encouraged RTO 6 months   Evelina Dun, FNP

## 2017-04-23 NOTE — Patient Instructions (Signed)

## 2017-04-24 ENCOUNTER — Other Ambulatory Visit: Payer: Self-pay | Admitting: Family

## 2017-04-24 LAB — CMP14+EGFR
A/G RATIO: 1.4 (ref 1.2–2.2)
ALBUMIN: 4.2 g/dL (ref 3.5–5.5)
ALT: 24 IU/L (ref 0–44)
AST: 15 IU/L (ref 0–40)
Alkaline Phosphatase: 111 IU/L (ref 39–117)
BUN / CREAT RATIO: 9 (ref 9–20)
BUN: 6 mg/dL (ref 6–24)
Bilirubin Total: 0.3 mg/dL (ref 0.0–1.2)
CALCIUM: 9.2 mg/dL (ref 8.7–10.2)
CO2: 25 mmol/L (ref 20–29)
Chloride: 99 mmol/L (ref 96–106)
Creatinine, Ser: 0.64 mg/dL — ABNORMAL LOW (ref 0.76–1.27)
GFR, EST AFRICAN AMERICAN: 126 mL/min/{1.73_m2} (ref >59)
GFR, EST NON AFRICAN AMERICAN: 109 mL/min/{1.73_m2} (ref >59)
GLOBULIN, TOTAL: 2.9 g/dL (ref 1.5–4.5)
Glucose: 101 mg/dL — ABNORMAL HIGH (ref 65–99)
POTASSIUM: 4.2 mmol/L (ref 3.5–5.2)
SODIUM: 139 mmol/L (ref 134–144)
TOTAL PROTEIN: 7.1 g/dL (ref 6.0–8.5)

## 2017-04-24 LAB — LIPID PANEL
CHOL/HDL RATIO: 3.9 ratio (ref 0.0–5.0)
Cholesterol, Total: 223 mg/dL — ABNORMAL HIGH (ref 100–199)
HDL: 57 mg/dL (ref >39)
LDL Calculated: 128 mg/dL — ABNORMAL HIGH (ref 0–99)
Triglycerides: 191 mg/dL — ABNORMAL HIGH (ref 0–149)
VLDL Cholesterol Cal: 38 mg/dL (ref 5–40)

## 2017-05-10 ENCOUNTER — Ambulatory Visit: Payer: Medicare Other

## 2017-05-11 ENCOUNTER — Encounter: Payer: Self-pay | Admitting: Family

## 2017-05-11 ENCOUNTER — Other Ambulatory Visit: Payer: Self-pay | Admitting: Family

## 2017-05-11 DIAGNOSIS — I10 Essential (primary) hypertension: Secondary | ICD-10-CM

## 2017-05-21 ENCOUNTER — Telehealth: Payer: Self-pay | Admitting: Family

## 2017-05-21 MED ORDER — CLINDAMYCIN HCL 300 MG PO CAPS: 300.0000 mg | ORAL_CAPSULE | Freq: Three times a day (TID) | ORAL | 0 refills | Status: DC

## 2017-05-21 NOTE — Telephone Encounter (Signed)
No voice mail at phone number.

## 2017-05-21 NOTE — Telephone Encounter (Signed)
I sent in clindamycin for his oral infection

## 2017-05-22 ENCOUNTER — Ambulatory Visit: Payer: Medicare Other | Admitting: Family Medicine

## 2017-05-24 ENCOUNTER — Encounter: Payer: Self-pay | Admitting: *Deleted

## 2017-06-04 ENCOUNTER — Encounter: Payer: Self-pay | Admitting: Neurology

## 2017-06-18 ENCOUNTER — Ambulatory Visit: Payer: Medicare Other | Admitting: Neurology

## 2017-06-20 ENCOUNTER — Other Ambulatory Visit: Payer: Self-pay | Admitting: Family

## 2017-06-20 DIAGNOSIS — G500 Trigeminal neuralgia: Secondary | ICD-10-CM

## 2017-07-12 ENCOUNTER — Ambulatory Visit (INDEPENDENT_AMBULATORY_CARE_PROVIDER_SITE_OTHER): Payer: Medicare Other | Admitting: Family

## 2017-07-12 ENCOUNTER — Encounter: Payer: Self-pay | Admitting: Family

## 2017-07-12 VITALS — BP 139/78 | HR 69 | Temp 97.6°F | Ht 69.0 in | Wt 235.8 lb

## 2017-07-12 DIAGNOSIS — F17200 Nicotine dependence, unspecified, uncomplicated: Secondary | ICD-10-CM | POA: Diagnosis not present

## 2017-07-12 DIAGNOSIS — J441 Chronic obstructive pulmonary disease with (acute) exacerbation: Secondary | ICD-10-CM

## 2017-07-12 MED ORDER — METHYLPREDNISOLONE ACETATE 80 MG/ML IJ SUSP: 80.0000 mg | Freq: Once | INTRAMUSCULAR | Status: DC

## 2017-07-12 MED ORDER — AZITHROMYCIN 250 MG PO TABS: ORAL_TABLET | ORAL | 0 refills | Status: DC

## 2017-07-12 MED ORDER — FLUTICASONE-UMECLIDIN-VILANT 100-62.5-25 MCG/INH IN AEPB: 1.0000 | INHALATION_SPRAY | Freq: Every day | RESPIRATORY_TRACT | 3 refills | Status: DC

## 2017-07-12 MED ORDER — PREDNISONE 10 MG (21) PO TBPK
ORAL_TABLET | ORAL | 0 refills | Status: DC
Start: 2017-07-12 — End: 2017-09-24

## 2017-07-12 NOTE — Progress Notes (Signed)
Subjective:    Patient ID: Matthew [name], male    DOB: 08-22-1960, 57 y.o.   MRN: [id number]  Chief Complaint  Patient presents with  . Cough    congestion, yellow sputum    Cough  This is a recurrent problem. The current episode started 1 to 4 weeks ago. The problem has been gradually worsening. The problem occurs every few minutes. The cough is productive of sputum and productive of purulent sputum. Associated symptoms include a fever, myalgias, nasal congestion, postnasal drip, rhinorrhea, a sore throat, shortness of breath and wheezing. Pertinent negatives include no chills, ear congestion, ear pain or headaches. The symptoms are aggravated by lying down. Risk factors for lung disease include smoking/tobacco exposure. He has tried rest for the symptoms. The treatment provided no relief. His past medical history is significant for COPD.      Review of Systems  Constitutional: Positive for fever. Negative for chills.  HENT: Positive for postnasal drip, rhinorrhea and sore throat. Negative for ear pain.   Respiratory: Positive for cough, shortness of breath and wheezing.   Musculoskeletal: Positive for myalgias.  Neurological: Negative for headaches.  All other systems reviewed and are negative.      Objective:   Physical Exam  Constitutional: He is oriented to person, place, and time. He appears well-developed and well-nourished. No distress.  HENT:  Head: Normocephalic.  Right Ear: External ear normal.  Left Ear: External ear normal.  Nose: Mucosal edema and rhinorrhea present.  Mouth/Throat: Posterior oropharyngeal erythema present.  Eyes: Pupils are equal, round, and reactive to light. Right eye exhibits no discharge. Left eye exhibits no discharge.  Neck: Normal range of motion. Neck supple. No thyromegaly present.  Cardiovascular: Normal rate, regular rhythm, normal heart sounds and intact distal pulses.  No murmur heard. Pulmonary/Chest: Effort normal. No respiratory  distress. He has wheezes.  Abdominal: Soft. Bowel sounds are normal. He exhibits no distension. There is no tenderness.  Musculoskeletal: Normal range of motion. He exhibits no edema or tenderness.  Neurological: He is alert and oriented to person, place, and time. He has normal reflexes. No cranial nerve deficit.  Skin: Skin is warm and dry. No rash noted. No erythema.  Psychiatric: He has a normal mood and affect. His behavior is normal. Judgment and thought content normal.  Vitals reviewed.     BP 139/78   Pulse 69   Temp 97.6 F (36.4 C) (Oral)   Ht 5\' 9"  (1.753 m)   Wt 235 lb 12.8 oz (107 kg)   SpO2 95%   BMI 34.82 kg/m      Assessment & Plan:  Chistian was seen today for cough.  Diagnoses and all orders for this visit:  COPD exacerbation (HCC) -     predniSONE (STERAPRED UNI-PAK 21 TAB) 10 MG (21) TBPK tablet; Use as directed -     azithromycin (ZITHROMAX) 250 MG tablet; Take 500 mg once, then 250 mg for four days -     Fluticasone-Umeclidin-Vilant (TRELEGY ELLIPTA) 100-62.5-25 MCG/INH AEPB; Inhale 1 puff into the lungs daily. -     methylPREDNISolone acetate (DEPO-MEDROL) injection 80 mg  Chronic obstructive pulmonary disease with acute exacerbation (HCC) -     Fluticasone-Umeclidin-Vilant (TRELEGY ELLIPTA) 100-62.5-25 MCG/INH AEPB; Inhale 1 puff into the lungs daily.  Current smoker  Smoking cessation discussed  - Take meds as prescribed - Use a cool mist humidifier  -Use saline nose sprays frequently -Force fluids -For any cough or congestion  Use plain Mucinex-  regular strength or max strength is fine -For fever or aces or pains- take tylenol or ibuprofen. -Throat lozenges if help -RTO if symptoms worsen or do not improve  Evelina Dun, FNP

## 2017-07-12 NOTE — Patient Instructions (Signed)
Chronic Obstructive Pulmonary Disease Exacerbation  Chronic obstructive pulmonary disease (COPD) is a common lung problem. In COPD, the flow of air from the lungs is limited. COPD exacerbations are times that breathing gets worse and you need extra treatment. Without treatment they can be life threatening. If they happen often, your lungs can become more damaged. If your COPD gets worse, your doctor may treat you with:  ? Medicines.  ? Oxygen.  ? Different ways to clear your airway, such as using a mask.    Follow these instructions at home:  ? Do not smoke.  ? Avoid tobacco smoke and other things that bother your lungs.  ? If given, take your antibiotic medicine as told. Finish the medicine even if you start to feel better.  ? Only take medicines as told by your doctor.  ? Drink enough fluids to keep your pee (urine) clear or pale yellow (unless your doctor has told you not to).  ? Use a cool mist machine (vaporizer).  ? If you use oxygen or a machine that turns liquid medicine into a mist (nebulizer), continue to use them as told.  ? Keep up with shots (vaccinations) as told by your doctor.  ? Exercise regularly.  ? Eat healthy foods.  ? Keep all doctor visits as told.  Get help right away if:  ? You are very short of breath and it gets worse.  ? You have trouble talking.  ? You have bad chest pain.  ? You have blood in your spit (sputum).  ? You have a fever.  ? You keep throwing up (vomiting).  ? You feel weak, or you pass out (faint).  ? You feel confused.  ? You keep getting worse.  This information is not intended to replace advice given to you by your health care provider. Make sure you discuss any questions you have with your health care provider.  Document Released: 01/19/2011 Document Revised: 07/08/2015 Document Reviewed: 10/04/2012  Elsevier Interactive Patient Education ? 2017 [employer]

## 2017-07-16 ENCOUNTER — Ambulatory Visit (INDEPENDENT_AMBULATORY_CARE_PROVIDER_SITE_OTHER): Payer: Medicare Other | Admitting: Neurology

## 2017-07-16 ENCOUNTER — Encounter: Payer: Self-pay | Admitting: Neurology

## 2017-07-16 DIAGNOSIS — G500 Trigeminal neuralgia: Secondary | ICD-10-CM

## 2017-07-16 MED ORDER — DIAZEPAM 5 MG PO TABS: ORAL_TABLET | ORAL | 0 refills | Status: DC

## 2017-07-16 MED ORDER — GABAPENTIN 600 MG PO TABS: ORAL_TABLET | ORAL | 3 refills | Status: DC

## 2017-07-16 NOTE — Patient Instructions (Addendum)
1.  Take gabapentin 1 tab in the morning, 1 tablet in the afternoon, and 1.5 tablet at bedtime 2.  Continue carbamazepine 200mg  - take 2 tablets three times daily 3.  MRI brain will be ordered.  Take valium 5mg  30-min prior to MRI to help relax you.  Return to clinic in 6 months

## 2017-07-16 NOTE — Addendum Note (Signed)
Addended by: Chester Holstein on: 07/16/2017 02:37 PM   Modules accepted: Orders

## 2017-07-16 NOTE — Progress Notes (Signed)
Follow-up Visit   Date: 07/16/17    Matthew [name] MRN: [id number] DOB: 11/22/1960   Interim History: Matthew [name] is a 57 y.o. right-handed Caucasian male with asthma, COPD, hypertension, hyperlipidemia, and current tobacco user returning for for evaluation of right trigeminal neuralgian.   Patient is illiterate and requests healthcare information be discussed with his son, Matthew [name], who is here today.   History of present illness: Starting around early 2000s, he began having right facial pain, described as "hurt". When give options to elaborate "hurt", cannot specify whether it is achy, throbbing, sharp, stabbing, tingling, or electrical.  It involves his gums and jaw on the right side only.  There are period where he has no pain, but this is not often. Pain is present for hours and there is nothing that exacerbates or alleviates his pain.  Pain is not worse with shaving or cold air.  Sometimes, chewing can make it better and other times, it will make it worse.  When pain is present, he also has slurred speech.  He denies any difficulty with swallowing.  Initially, he saw someone in [place] who started carbamazepine.  Several years, ago, he saw a dentist who was concerned it was due to jaw problems and had all of his teeth extracted, but there was no relief.  More recently, he was started on gabapentin 600mg  TID by his PCP.   His medication list shows that he should be taking carbamazepine 200mg  four times daily and gabapentin 600mg  TID.  However, patient shows me the pill bottles and because he cannot read letter, he is mistakenly taking gabapentin 600mg  TID and 600mg  QID (mistakeninly thinking it is carbamazepine).  He does not have any pill bottles for carbamazepine.  He lives alone and his son, who lives in [place], tries to help as much as he can.  He used to have assistance with medications before, but this help is no longer available.   UPDATE 07/16/2017:  He is here for 6 month  follow-up visit.  Since clarifying his medication dose and frequency at his last visit, his pain is much better controlled and speech has also improved.  He takes gabapentin 600mg  TID and carbamazepine 400mg  TID and tolerates this well.  He continues to have spells of breakthrough right facial pain, worse after dinner when he has been chewing a lot, or when laying on the right cheek to sleep.  His speech is much improved and he attributes this better pain control.    Medications:  Current Outpatient Medications on File Prior to Visit  Medication Sig Dispense Refill  . albuterol (PROVENTIL HFA;VENTOLIN HFA) 108 (90 Base) MCG/ACT inhaler TAKE 2 PUFFS BY MOUTH EVERY 6 HOURS AS NEEDED FOR WHEEZE OR SHORTNESS OF BREATH 18 Inhaler 2  . amLODipine (NORVASC) 10 MG tablet TAKE 1 TABLET BY MOUTH EVERY DAY 90 tablet 1  . atorvastatin (LIPITOR) 40 MG tablet TAKE 1 TABLET EVERY EVENING 90 tablet 0  . azithromycin (ZITHROMAX) 250 MG tablet Take 500 mg once, then 250 mg for four days 6 tablet 0  . carbamazepine (TEGRETOL) 200 MG tablet TAKE 2 TABLETS BY MOUTH 4 TIMES A DAY 720 tablet 0  . fluticasone (FLONASE) 50 MCG/ACT nasal spray Place 2 sprays into both nostrils daily. 48 g 1  . Fluticasone-Umeclidin-Vilant (TRELEGY ELLIPTA) 100-62.5-25 MCG/INH AEPB Inhale 1 puff into the lungs daily. 1 each 3  . hydrochlorothiazide (MICROZIDE) 12.5 MG capsule TAKE 1 CAPSULE BY MOUTH EVERY DAY 90  capsule 1  . predniSONE (STERAPRED UNI-PAK 21 TAB) 10 MG (21) TBPK tablet Use as directed 21 tablet 0   Current Facility-Administered Medications on File Prior to Visit  Medication Dose Route Frequency Provider Last Rate Last Dose  . methylPREDNISolone acetate (DEPO-MEDROL) injection 80 mg  80 mg Intramuscular Once Evelina Dun A, FNP        Allergies:  Allergies  Allergen Reactions  . Penicillins     Review of Systems:  CONSTITUTIONAL: No fevers, chills, night sweats, or weight loss.  EYES: No visual changes or eye  pain ENT: No hearing changes.  No history of nose bleeds.   RESPIRATORY: No cough, wheezing and shortness of breath.   CARDIOVASCULAR: Negative for chest pain, and palpitations.   GI: Negative for abdominal discomfort, blood in stools or black stools.  No recent change in bowel habits.   GU:  No history of incontinence.   MUSCLOSKELETAL: No history of joint pain or swelling.  No myalgias.   SKIN: Negative for lesions, rash, and itching.   ENDOCRINE: Negative for cold or heat intolerance, polydipsia or goiter.   PSYCH:  No depression or anxiety symptoms.   NEURO: As Above.   Vital Signs:  BP (!) 144/88   Pulse (!) 58   Ht 5\' 9"  (1.753 m)   Wt 229 lb 2 oz (103.9 kg)   SpO2 93%   BMI 33.84 kg/m    General Medical Exam:   General:  Well appearing, comfortable  HEENT: Edentulous.  No carotid bruits. Respiratory:  Clear to auscultation, good air entry bilaterally.   Cardiac:  Regular rate and rhythm, no murmur.   Ext:  No edema  Neurological Exam: MENTAL STATUS including orientation to time, place, person, recent and remote memory, attention span and concentration, language, and fund of knowledge is normal.  Speech is very mildly dysarthric due to being edentulous.  CRANIAL NERVES:  Pupils equal round and reactive to light.  Normal conjugate, extra-ocular eye movements in all directions of gaze.  No ptosis.Face is symmetric.  MOTOR:  Motor strength is 5/5 in all extremities  COORDINATION/GAIT:  Gait narrow based and stable.   Data: CT head wo contrast 08/21/2012: 1. No acute intracranial abnormality. 2. Chronic vertebrobasilar dolichoectasia. 3. Evidence of chronic small vessel ischemia which is new or progressed since 2010   IMPRESSION/PLAN: Right trigeminal neuralgia (onset early 2000s).  Pain is much better controlled after clarifying which medications he should have been taking, but he continues to have mild breakthrough pain in the evenings, especially after eating  dinner.  Recommend increasing bedtime dose of gabapentin to 900mg  daily and continue 600mg  in the morning and afternoon.  Continue carbamazepine 400mg  three times daily.  MRI brain - trigeminal nerve protocol - will be ordered, he will be given valium 5mg  to premedicate.  Dysarthria has significantly improved.  Prior dysarthria always occurred in the setting of severe pain and most likely due to natural reflex to pain and guard his mouth during conversation. He has baseline mild dysarthria from being edentulous.  No worrisome findings on his exam or history to suggest worrisome pathology.   Return to clinic in 6 months   Thank you for allowing me to participate in patient's care.  If I can answer any additional questions, I would be pleased to do so.    Sincerely,    Donika K. Posey Pronto, DO

## 2017-07-17 ENCOUNTER — Telehealth: Payer: Self-pay | Admitting: *Deleted

## 2017-07-17 NOTE — Telephone Encounter (Signed)
NO PA NEEDED FOR MRI.

## 2017-08-15 ENCOUNTER — Other Ambulatory Visit: Payer: Self-pay | Admitting: Family

## 2017-08-15 DIAGNOSIS — J42 Unspecified chronic bronchitis: Secondary | ICD-10-CM

## 2017-08-15 DIAGNOSIS — R0602 Shortness of breath: Secondary | ICD-10-CM

## 2017-08-15 DIAGNOSIS — R062 Wheezing: Secondary | ICD-10-CM

## 2017-09-24 ENCOUNTER — Encounter: Payer: Self-pay | Admitting: Family

## 2017-09-24 ENCOUNTER — Encounter: Payer: Self-pay | Admitting: Cardiology

## 2017-09-24 ENCOUNTER — Ambulatory Visit (INDEPENDENT_AMBULATORY_CARE_PROVIDER_SITE_OTHER): Payer: Medicare Other | Admitting: Family

## 2017-09-24 VITALS — BP 159/87 | HR 52 | Temp 97.1°F | Ht 69.0 in | Wt 237.0 lb

## 2017-09-24 DIAGNOSIS — R5383 Other fatigue: Secondary | ICD-10-CM | POA: Diagnosis not present

## 2017-09-24 DIAGNOSIS — I10 Essential (primary) hypertension: Secondary | ICD-10-CM

## 2017-09-24 DIAGNOSIS — R0602 Shortness of breath: Secondary | ICD-10-CM | POA: Diagnosis not present

## 2017-09-24 DIAGNOSIS — R001 Bradycardia, unspecified: Secondary | ICD-10-CM | POA: Diagnosis not present

## 2017-09-24 MED ORDER — ALBUTEROL SULFATE HFA 108 (90 BASE) MCG/ACT IN AERS: INHALATION_SPRAY | RESPIRATORY_TRACT | 3 refills | Status: DC

## 2017-09-24 NOTE — Progress Notes (Signed)
   Subjective:    Patient ID: Matthew [name], male    DOB: 1960/11/24, 57 y.o.   MRN: [id number]  Chief Complaint  Patient presents with  . Bradycardia    HPI PT presents to the office today with bradycardia. He states he was taking his BP at his brothers home and noticed his heart rate was 54 bpm. Pt states he feels tired. Denies any chest pain or edema.    Review of Systems  All other systems reviewed and are negative.      Objective:   Physical Exam  Constitutional: He is oriented to person, place, and time. He appears well-developed and well-nourished. No distress.  HENT:  Head: Normocephalic.  Right Ear: External ear normal.  Left Ear: External ear normal.  Mouth/Throat: Oropharynx is clear and moist.  Eyes: Pupils are equal, round, and reactive to light. Right eye exhibits no discharge. Left eye exhibits no discharge.  Neck: Normal range of motion. Neck supple. No thyromegaly present.  Cardiovascular: Normal heart sounds and intact distal pulses. Bradycardia present.  No murmur heard. Pulmonary/Chest: Effort normal. No respiratory distress. He has wheezes.  Abdominal: Soft. Bowel sounds are normal. He exhibits no distension. There is no tenderness.  Musculoskeletal: Normal range of motion. He exhibits no edema or tenderness.  Neurological: He is alert and oriented to person, place, and time. He has normal reflexes. No cranial nerve deficit.  Skin: Skin is warm and dry. No rash noted. No erythema.  Psychiatric: He has a normal mood and affect. His behavior is normal. Judgment and thought content normal.  Vitals reviewed.     BP (!) 159/87   Pulse (!) 52   Temp (!) 97.1 F (36.2 C) (Oral)   Ht _0  (1.753 m)   Wt 237 lb (107.5 kg)   BMI 35.00 kg/m      Assessment & Plan:  Matthew [name] comes in today with chief complaint of Bradycardia   Diagnosis and orders addressed:  1. Bradycardia - Ambulatory referral to Cardiology - CMP14+EGFR - CBC with  Differential/Platelet  2. SOB (shortness of breath) - albuterol (VENTOLIN HFA) 108 (90 Base) MCG/ACT inhaler; INHALE 2 PUFFS INTO LUNGS EVERY 6 HOURS AS NEEDED FOR WHEEZE OR SHORTNESS OF BREATH  Dispense: 18 Inhaler; Refill: 3 - EKG 12-Lead - Ambulatory referral to Cardiology - CMP14+EGFR - CBC with Differential/Platelet  3. Fatigue, unspecified type - Ambulatory referral to Cardiology - CMP14+EGFR - CBC with Differential/Platelet  4. Essential hypertension - Ambulatory referral to Cardiology - CMP14+EGFR - CBC with Differential/Platelet  Referral to Cardiologists pending Smoking cessation discussed   Evelina Dun, FNP

## 2017-09-24 NOTE — Progress Notes (Signed)
Cardiology Office Note  Date: 09/25/2017   ID: Matthew [name], DOB Dec 02, 1960, MRN [id number]  PCP: [name], FNP  Consulting Cardiologist: Rozann Lesches, MD   Chief Complaint  Patient presents with  . Bradycardia    History of Present Illness: Matthew [name] is a 57 y.o. male referred for cardiology consultation by Ms. Hawks NP for the assessment of bradycardia.  Patient states that he has checked his blood pressure and heart rate with automatic cuffs at drugstores occasionally, has noticed his heart rate to be in the "50s" at times.  This has been documented at medical visits as well based on chart review.  He states that he is "tired," not specifically with exertion, just overall.  Also complains of chronic leg pain due to arthritis.  He does not endorse any exertional angina, describes NYHA class II dyspnea, has had no palpitations or frank syncope.  I personally reviewed his recent ECG as outlined below.  Prior tracing from 2014 showed sinus bradycardia at 59 bpm with right bundle branch block and left anterior fascicular block.  We went over his medications, he is not on any AV nodal blockers.  Past Medical History:  Diagnosis Date  . Abscess   . Anxiety   . Aphthous ulcer   . Arthritis   . Asthma   . Chronic bronchitis   . Chronic pain   . COPD (chronic obstructive pulmonary disease) ([place])   . Depression   . Hyperlipidemia   . Hypertension   . Trigeminal neuralgia   . Vertigo     Past Surgical History:  Procedure Laterality Date  . CHOLECYSTECTOMY    . HAND RECONSTRUCTION Right 1990's    Current Outpatient Medications  Medication Sig Dispense Refill  . albuterol (VENTOLIN HFA) 108 (90 Base) MCG/ACT inhaler INHALE 2 PUFFS INTO LUNGS EVERY 6 HOURS AS NEEDED FOR WHEEZE OR SHORTNESS OF BREATH 18 Inhaler 3  . amLODipine (NORVASC) 10 MG tablet TAKE 1 TABLET BY MOUTH EVERY DAY 90 tablet 1  . atorvastatin (LIPITOR) 40 MG tablet TAKE 1 TABLET EVERY EVENING 90  tablet 0  . carbamazepine (TEGRETOL) 200 MG tablet TAKE 2 TABLETS BY MOUTH 4 TIMES A DAY 720 tablet 0  . fluticasone (FLONASE) 50 MCG/ACT nasal spray Place 2 sprays into both nostrils daily. 48 g 1  . Fluticasone-Umeclidin-Vilant (TRELEGY ELLIPTA) 100-62.5-25 MCG/INH AEPB Inhale 1 puff into the lungs daily. 1 each 3  . gabapentin (NEURONTIN) 600 MG tablet Take 1 tablet in the morning, 1 tablet in the afternoon, and 1.5 tablet at bedtime 405 tablet 3  . hydrochlorothiazide (MICROZIDE) 12.5 MG capsule TAKE 1 CAPSULE BY MOUTH EVERY DAY 90 capsule 1   No current facility-administered medications for this visit.    Allergies:  Penicillins   Social History: The patient  reports that he has been smoking cigarettes. He started smoking about 42 years ago. He has a 60.00 pack-year smoking history. He has never used smokeless tobacco. He reports that he drinks alcohol. He reports that he does not use drugs.   Family History: The patient's family history includes Diabetes in his father and mother; Hypertension in his father and mother.   ROS:  Please see the history of present illness. Otherwise, complete review of systems is positive for chronic hip pain, mainly left-sided.  All other systems are reviewed and negative.   Physical Exam: VS:  BP (!) 162/102 (BP Location: Right Arm, Cuff Size: Large)   Pulse 60 Comment: radial  Ht 5\' 9"  (1.753 m)   Wt 234 lb 3.2 oz (106.2 kg)   SpO2 96%   BMI 34.59 kg/m , BMI Body mass index is 34.59 kg/m.  Wt Readings from Last 3 Encounters:  09/25/17 234 lb 3.2 oz (106.2 kg)  09/24/17 237 lb (107.5 kg)  07/16/17 229 lb 2 oz (103.9 kg)    General: Chronically ill-appearing male, appears comfortable at rest. HEENT: Conjunctiva and lids normal, oropharynx clear with poor dentition. Neck: Supple, no elevated JVP or carotid bruits, no thyromegaly. Lungs: Diminished breath sounds without wheezing, nonlabored breathing at rest. Cardiac: Regular rate and rhythm, no  S3, 2/6 systolic murmur, no pericardial rub. Abdomen: Soft, nontender, bowel sounds present. Extremities: Trace ankle edema, distal pulses 2+. Skin: Warm and dry. Musculoskeletal: No kyphosis. Neuropsychiatric: Alert and oriented x3, affect grossly appropriate.  ECG: I personally reviewed the tracing from 09/24/2017 which showed a low atrial rhythm with right bundle branch block and left anterior fascicular block, heart rate 50 bpm.  Recent Labwork: 09/24/2017: ALT 24; AST 17; BUN 6; Creatinine, Ser 0.68; Hemoglobin 14.6; Platelets 258; Potassium 4.6; Sodium 142     Component Value Date/Time   CHOL 223 (H) 04/23/2017 1220   TRIG 191 (H) 04/23/2017 1220   HDL 57 04/23/2017 1220   CHOLHDL 3.9 04/23/2017 1220   LDLCALC 128 (H) 04/23/2017 1220    Other Studies Reviewed Today:  Chest CT 01/18/2016: FINDINGS: Cardiovascular: There are coronary artery calcifications present primarily in the distribution of the left anterior descending coronary artery. The heart is within normal limits in size. No pericardial effusion is seen. The mid ascending thoracic aorta measures 39 mm in diameter. Mild thoracic aortic atherosclerosis is present.  Mediastinum/Nodes: There are small mediastinal lymph nodes present none of which are pathologically enlarged.  Lungs/Pleura: At the site questioned on chest x-ray in the right upper lung, no lung lesion is seen. The area questioned on chest x-ray represents considerable degenerative change at the right first costochondral junction  Upper Abdomen: The upper abdomen that is visualized on this study is unremarkable with probable small adrenal adenomas present bilaterally.  Musculoskeletal: The thoracic vertebrae are in normal alignment with dinner degenerative change in the mid to lower thoracic spine.  IMPRESSION: 1. The area questioned by chest x-ray overlying the right upper lung represents considerable degenerative change involving the  right first costochondral junction. No lung lesion is seen. 2. Coronary artery calcifications. 3. Mild thoracic aortic atherosclerosis.  Assessment and Plan:  1.  Bradycardia, heart rate in the 50s based on recent assessment.  I did review his recent ECG which shows a low atrial rhythm with evidence of other conduction system disease including right bundle branch block and left anterior fascicular block (old).  It is not entirely clear that he is symptomatic based on his current heart rates.  He has a soft systolic murmur on examination as well.  Plan is to obtain an echocardiogram to further evaluate cardiac structure and function and also a 24-hour Holter monitor to better assess heart rate variability.  2.  Hypertension, blood pressure is significantly elevated today.  He states that he takes Norvasc regularly.  I recommended that he continue to follow with his PCP in case additional adjustments are necessary.  3.  Mixed hyperlipidemia, he states that he is not taking Lipitor at this point.  Last LDL was 128 in March.  I recommended that he reconsider initiation of statin therapy in light of previously incidentally noted atherosclerosis by chest  CT imaging.  4.  Tobacco abuse with COPD.  Smoking cessation recommended.  5.  At risk for obstructive sleep apnea.  This could also be a contributor to bradycardia, although generally this is nocturnal.  Consider sleep testing per PCP.  Current medicines were reviewed with the patient today.   Orders Placed This Encounter  Procedures  . Holter monitor - 24 hour  . ECHOCARDIOGRAM COMPLETE    Disposition: Call with test results.  Signed, Satira Sark, MD, [facility] 09/25/2017 9:22 AM    [facility] [address] [place], [place] [address] Phone: [phone]; Fax: [phone]

## 2017-09-24 NOTE — Patient Instructions (Signed)
Bradycardia, Adult °Bradycardia is a slower-than-normal heartbeat. A normal resting heart rate for an adult ranges from 60 to 100 beats per minute. With bradycardia, the resting heart rate is less than 60 beats per minute. °Bradycardia can prevent enough oxygen from reaching certain areas of your body when you are active. It can be serious if it keeps enough oxygen from reaching your brain and other parts of your body. Bradycardia is not a problem for everyone. For some healthy adults, a slow resting heart rate is normal. °What are the causes? °This condition may be caused by: °· A problem with the heart, including: °? A problem with the heart's electrical system, such as a heart block. °? A problem with the heart's natural pacemaker (sinus node). °? Heart disease. °? A heart attack. °? Heart damage. °? A heart infection. °? A heart condition that is present at birth (congenital heart defect). °· Certain medicines that treat heart conditions. °· Certain conditions, such as hypothyroidism and obstructive sleep apnea. °· Problems with the balance of chemicals and other substances, like potassium, in the blood. ° °What increases the risk? °This condition is more likely to develop in adults who: °· Are age 65 or older. °· Have high blood pressure (hypertension), high cholesterol (hyperlipidemia), or diabetes. °· Drink heavily, use tobacco or nicotine products, or use drugs. °· Are stressed. ° °What are the signs or symptoms? °Symptoms of this condition include: °· Light-headedness. °· Feeling faint or fainting. °· Fatigue and weakness. °· Shortness of breath. °· Chest pain (angina). °· Drowsiness. °· Confusion. °· Dizziness. ° °How is this diagnosed? °This condition may be diagnosed based on: °· Your symptoms. °· Your medical history. °· A physical exam. ° °During the exam, your health care provider will listen to your heartbeat and check your pulse. To confirm the diagnosis, your health care provider may order tests,  such as: °· Blood tests. °· An electrocardiogram (ECG). This test records the heart's electrical activity. The test can show how fast your heart is beating and whether the heartbeat is steady. °· A test in which you wear a portable device (event recorder or Holter monitor) to record your heart's electrical activity while you go about your day. °· An exercise test. ° °How is this treated? °Treatment for this condition depends on the cause of the condition and how severe your symptoms are. Treatment may involve: °· Treatment of the underlying condition. °· Changing your medicines or how much medicine you take. °· Having a small, battery-operated device called a pacemaker implanted under the skin. When bradycardia occurs, this device can be used to increase your heart rate and help your heart to beat in a regular rhythm. ° °Follow these instructions at home: °Lifestyle ° °· Manage any health conditions that contribute to bradycardia as told by your health care provider. °· Follow a heart-healthy diet. A nutrition specialist (dietitian) can help to educate you about healthy food options and changes. °· Follow an exercise program that is approved by your health care provider. °· Maintain a healthy weight. °· Try to reduce or manage your stress, such as with yoga or meditation. If you need help reducing stress, ask your health care provider. °· Do not use use any products that contain nicotine or tobacco, such as cigarettes and e-cigarettes. If you need help quitting, ask your health care provider. °· Do not use illegal drugs. °· Limit alcohol intake to no more than 1 drink per day for nonpregnant women and 2 drinks per   day for men. One drink equals 12 oz of beer, 5 oz of wine, or 1½ oz of hard liquor. °General instructions °· Take over-the-counter and prescription medicines only as told by your health care provider. °· Keep all follow-up visits as directed by your health care provider. This is important. °How is this  prevented? °In some cases, bradycardia may be prevented by: °· Treating underlying medical problems. °· Stopping behaviors or medicines that can trigger the condition. ° °Contact a health care provider if: °· You feel light-headed or dizzy. °· You almost faint. °· You feel weak or are easily fatigued during physical activity. °· You experience confusion or have memory problems. °Get help right away if: °· You faint. °· You have an irregular heartbeat (palpitations). °· You have chest pain. °· You have trouble breathing. °This information is not intended to replace advice given to you by your health care provider. Make sure you discuss any questions you have with your health care provider. °Document Released: 10/22/2001 Document Revised: 09/28/2015 Document Reviewed: 07/22/2015 °Elsevier Interactive Patient Education © 2017 [employer] ° °

## 2017-09-25 ENCOUNTER — Ambulatory Visit (HOSPITAL_COMMUNITY)
Admission: RE | Admit: 2017-09-25 | Discharge: 2017-09-25 | Disposition: A | Discharge status: Home / Self Care | Payer: Medicare Other | Source: Ambulatory Visit | Attending: Cardiology | Admitting: Cardiology

## 2017-09-25 ENCOUNTER — Encounter: Payer: Self-pay | Admitting: Cardiology

## 2017-09-25 ENCOUNTER — Ambulatory Visit (INDEPENDENT_AMBULATORY_CARE_PROVIDER_SITE_OTHER): Payer: Medicare Other | Admitting: Cardiology

## 2017-09-25 VITALS — BP 162/102 | HR 60 | Ht 69.0 in | Wt 234.2 lb

## 2017-09-25 DIAGNOSIS — I358 Other nonrheumatic aortic valve disorders: Secondary | ICD-10-CM | POA: Insufficient documentation

## 2017-09-25 DIAGNOSIS — Z720 Tobacco use: Secondary | ICD-10-CM | POA: Diagnosis not present

## 2017-09-25 DIAGNOSIS — R001 Bradycardia, unspecified: Secondary | ICD-10-CM

## 2017-09-25 DIAGNOSIS — Z9189 Other specified personal risk factors, not elsewhere classified: Secondary | ICD-10-CM

## 2017-09-25 DIAGNOSIS — I10 Essential (primary) hypertension: Secondary | ICD-10-CM | POA: Diagnosis not present

## 2017-09-25 DIAGNOSIS — F1120 Opioid dependence, uncomplicated: Secondary | ICD-10-CM | POA: Diagnosis not present

## 2017-09-25 DIAGNOSIS — R011 Cardiac murmur, unspecified: Secondary | ICD-10-CM

## 2017-09-25 DIAGNOSIS — I493 Ventricular premature depolarization: Secondary | ICD-10-CM | POA: Insufficient documentation

## 2017-09-25 DIAGNOSIS — I77810 Thoracic aortic ectasia: Secondary | ICD-10-CM | POA: Insufficient documentation

## 2017-09-25 DIAGNOSIS — I119 Hypertensive heart disease without heart failure: Secondary | ICD-10-CM | POA: Insufficient documentation

## 2017-09-25 DIAGNOSIS — E785 Hyperlipidemia, unspecified: Secondary | ICD-10-CM | POA: Insufficient documentation

## 2017-09-25 DIAGNOSIS — R9431 Abnormal electrocardiogram [ECG] [EKG]: Secondary | ICD-10-CM | POA: Diagnosis not present

## 2017-09-25 DIAGNOSIS — J449 Chronic obstructive pulmonary disease, unspecified: Secondary | ICD-10-CM | POA: Diagnosis not present

## 2017-09-25 LAB — CBC WITH DIFFERENTIAL/PLATELET
Basophils Absolute: 0 10*3/uL (ref 0.0–0.2)
Basos: 0 %
EOS (ABSOLUTE): 0.1 10*3/uL (ref 0.0–0.4)
Eos: 1 %
Hematocrit: 44.2 % (ref 37.5–51.0)
Hemoglobin: 14.6 g/dL (ref 13.0–17.7)
IMMATURE GRANS (ABS): 0 10*3/uL (ref 0.0–0.1)
Immature Granulocytes: 1 %
LYMPHS: 28 %
Lymphocytes Absolute: 2.4 10*3/uL (ref 0.7–3.1)
MCH: 29.6 pg (ref 26.6–33.0)
MCHC: 33 g/dL (ref 31.5–35.7)
MCV: 90 fL (ref 79–97)
MONOS ABS: 0.7 10*3/uL (ref 0.1–0.9)
Monocytes: 8 %
NEUTROS ABS: 5.4 10*3/uL (ref 1.4–7.0)
Neutrophils: 62 %
PLATELETS: 258 10*3/uL (ref 150–450)
RBC: 4.93 x10E6/uL (ref 4.14–5.80)
RDW: 13.5 % (ref 12.3–15.4)
WBC: 8.6 10*3/uL (ref 3.4–10.8)

## 2017-09-25 LAB — CMP14+EGFR
A/G RATIO: 1.5 (ref 1.2–2.2)
ALT: 24 IU/L (ref 0–44)
AST: 17 IU/L (ref 0–40)
Albumin: 4.3 g/dL (ref 3.5–5.5)
Alkaline Phosphatase: 100 IU/L (ref 39–117)
BILIRUBIN TOTAL: 0.3 mg/dL (ref 0.0–1.2)
BUN/Creatinine Ratio: 9 (ref 9–20)
BUN: 6 mg/dL (ref 6–24)
CO2: 27 mmol/L (ref 20–29)
Calcium: 9.3 mg/dL (ref 8.7–10.2)
Chloride: 99 mmol/L (ref 96–106)
Creatinine, Ser: 0.68 mg/dL — ABNORMAL LOW (ref 0.76–1.27)
GFR calc non Af Amer: 105 mL/min/{1.73_m2} (ref >59)
GFR, EST AFRICAN AMERICAN: 122 mL/min/{1.73_m2} (ref >59)
Globulin, Total: 2.8 g/dL (ref 1.5–4.5)
Glucose: 68 mg/dL (ref 65–99)
POTASSIUM: 4.6 mmol/L (ref 3.5–5.2)
SODIUM: 142 mmol/L (ref 134–144)
TOTAL PROTEIN: 7.1 g/dL (ref 6.0–8.5)

## 2017-09-25 LAB — ECHOCARDIOGRAM COMPLETE
HEIGHTINCHES: 69 in
WEIGHTICAEL: 3747.2 [oz_av]

## 2017-09-25 NOTE — Progress Notes (Signed)
*  PRELIMINARY RESULTS* Echocardiogram 2D Echocardiogram has been performed.  Matthew [name] 09/25/2017, 10:28 AM

## 2017-09-25 NOTE — Patient Instructions (Addendum)
Your physician wants you to follow-up in: to be determined after tests    Your physician has recommended that you wear a holter monitor for 24 hrs. Holter monitors are medical devices that record the heart's electrical activity. Doctors most often use these monitors to diagnose arrhythmias. Arrhythmias are problems with the speed or rhythm of the heartbeat. The monitor is a small, portable device. You can wear one while you do your normal daily activities. This is usually used to diagnose what is causing palpitations/syncope (passing out).   Your physician has requested that you have an echocardiogram. Echocardiography is a painless test that uses sound waves to create images of your heart. It provides your doctor with information about the size and shape of your heart and how well your heart's chambers and valves are working. This procedure takes approximately one hour. There are no restrictions for this procedure.    Your physician recommends that you continue on your current medications as directed. Please refer to the Current Medication list given to you today.      If you need a refill on your cardiac medications before your next appointment, please call your pharmacy.              Thank you for choosing [facility] !

## 2017-09-26 ENCOUNTER — Telehealth: Payer: Self-pay | Admitting: Cardiology

## 2017-09-26 NOTE — Telephone Encounter (Signed)
Pt notified. Voiced understanding.

## 2017-09-26 NOTE — Telephone Encounter (Signed)
Yes he may continue with his regular activity as tolerated at this point.

## 2017-09-26 NOTE — Telephone Encounter (Signed)
Pt would like to know if he can go ahead and do his normal daily activities, he cuts wood on a wood splitter. Please give him a call

## 2017-09-26 NOTE — Telephone Encounter (Signed)
Returned pt call to obtain more information. He states that he is currently trying to cut up some trees for the winter. He uses a wood splitter to to this. He does have to lift the logs and place them on the tree splitter. He states it is strenuous but he feels capable to do this. He was making sure he did not need to wait until the testing was complete. Please advise.

## 2017-09-28 ENCOUNTER — Other Ambulatory Visit: Payer: Self-pay | Admitting: Family

## 2017-09-28 DIAGNOSIS — R001 Bradycardia, unspecified: Secondary | ICD-10-CM

## 2017-09-28 DIAGNOSIS — E669 Obesity, unspecified: Secondary | ICD-10-CM

## 2017-10-11 ENCOUNTER — Ambulatory Visit (INDEPENDENT_AMBULATORY_CARE_PROVIDER_SITE_OTHER): Payer: Medicare Other | Admitting: Family

## 2017-10-11 ENCOUNTER — Encounter: Payer: Self-pay | Admitting: Family

## 2017-10-11 VITALS — BP 155/87 | HR 53 | Temp 97.4°F | Ht 69.0 in | Wt 238.0 lb

## 2017-10-11 DIAGNOSIS — R001 Bradycardia, unspecified: Secondary | ICD-10-CM

## 2017-10-11 DIAGNOSIS — E559 Vitamin D deficiency, unspecified: Secondary | ICD-10-CM

## 2017-10-11 DIAGNOSIS — I10 Essential (primary) hypertension: Secondary | ICD-10-CM | POA: Diagnosis not present

## 2017-10-11 DIAGNOSIS — R5383 Other fatigue: Secondary | ICD-10-CM | POA: Diagnosis not present

## 2017-10-11 MED ORDER — LISINOPRIL-HYDROCHLOROTHIAZIDE 20-12.5 MG PO TABS: 1.0000 | ORAL_TABLET | Freq: Every day | ORAL | 3 refills | Status: DC

## 2017-10-11 NOTE — Progress Notes (Signed)
   Subjective:    Patient ID: Matthew [name], male    DOB: 06-Sep-1960, 57 y.o.   MRN: [id number]  Chief Complaint  Patient presents with  . Hypertension  . Bradycardia   PT presents to the office today with elevated BP at home and bradycardia. Pt states he saw the Cardiologists who did an ECHO and Holter monitor. He did not think his fatigue was related to his heart rate and suggested him get a sleep study.  Hypertension  This is a chronic problem. The current episode started more than 1 year ago. The problem has been waxing and waning since onset. The problem is uncontrolled. Associated symptoms include malaise/fatigue. Pertinent negatives include no peripheral edema. Risk factors for coronary artery disease include dyslipidemia, obesity, male gender and smoking/tobacco exposure. The current treatment provides mild improvement.      Review of Systems  Constitutional: Positive for malaise/fatigue.  All other systems reviewed and are negative.      Objective:   Physical Exam  Constitutional: He is oriented to person, place, and time. He appears well-developed and well-nourished. No distress.  HENT:  Head: Normocephalic.  Right Ear: External ear normal.  Left Ear: External ear normal.  Mouth/Throat: Oropharynx is clear and moist.  Eyes: Pupils are equal, round, and reactive to light. Right eye exhibits no discharge. Left eye exhibits no discharge.  Neck: Normal range of motion. Neck supple. No thyromegaly present.  Cardiovascular: Normal rate, regular rhythm and intact distal pulses.  Murmur heard. Pulmonary/Chest: Effort normal and breath sounds normal. No respiratory distress. He has no wheezes.  Abdominal: Soft. Bowel sounds are normal. He exhibits no distension. There is no tenderness.  Musculoskeletal: Normal range of motion. He exhibits no edema or tenderness.  Neurological: He is alert and oriented to person, place, and time. He has normal reflexes. No cranial nerve deficit.    Skin: Skin is warm and dry. No rash noted. No erythema.  Psychiatric: He has a normal mood and affect. His behavior is normal. Judgment and thought content normal.  Vitals reviewed.     BP (!) 155/87   Pulse (!) 53   Temp (!) 97.4 F (36.3 C) (Oral)   Ht _0  (1.753 m)   Wt 238 lb (108 kg)   BMI 35.15 kg/m      Assessment & Plan:  Matthew [name] comes in today with chief complaint of Hypertension and Bradycardia   Diagnosis and orders addressed:  1. Essential hypertension Stop HCTZ 12.5 mg and start Lisinopril-HCTZ 20-12.5 mg -Dash diet information given -Exercise encouraged - Stress Management  -Continue current meds -RTO in 2 weeks  - lisinopril-hydrochlorothiazide (ZESTORETIC) 20-12.5 MG tablet; Take 1 tablet by mouth daily.  Dispense: 90 tablet; Refill: 3 - CMP14+EGFR - CBC with Differential/Platelet  2. Bradycardia - CMP14+EGFR - CBC with Differential/Platelet  3. Fatigue, unspecified type Follow up with sleep study - CMP14+EGFR - CBC with Differential/Platelet - TSH - VITAMIN D 25 Hydroxy (Vit-D Deficiency, Fractures)  4. Vitamin D deficiency - VITAMIN D 25 Hydroxy (Vit-D Deficiency, Fractures)   Labs pending Health Maintenance reviewed Diet and exercise encouraged  Follow up plan: 2 weeks to recheck HTN   Evelina Dun, FNP

## 2017-10-11 NOTE — Patient Instructions (Signed)

## 2017-10-12 LAB — CBC WITH DIFFERENTIAL/PLATELET
BASOS ABS: 0 10*3/uL (ref 0.0–0.2)
Basos: 1 %
EOS (ABSOLUTE): 0.1 10*3/uL (ref 0.0–0.4)
Eos: 2 %
HEMATOCRIT: 44.1 % (ref 37.5–51.0)
Hemoglobin: 14.8 g/dL (ref 13.0–17.7)
IMMATURE GRANS (ABS): 0 10*3/uL (ref 0.0–0.1)
IMMATURE GRANULOCYTES: 0 %
LYMPHS ABS: 1.9 10*3/uL (ref 0.7–3.1)
LYMPHS: 26 %
MCH: 30.1 pg (ref 26.6–33.0)
MCHC: 33.6 g/dL (ref 31.5–35.7)
MCV: 90 fL (ref 79–97)
MONOCYTES: 9 %
Monocytes Absolute: 0.7 10*3/uL (ref 0.1–0.9)
NEUTROS ABS: 4.5 10*3/uL (ref 1.4–7.0)
Neutrophils: 62 %
Platelets: 232 10*3/uL (ref 150–450)
RBC: 4.92 x10E6/uL (ref 4.14–5.80)
RDW: 13.9 % (ref 12.3–15.4)
WBC: 7.2 10*3/uL (ref 3.4–10.8)

## 2017-10-12 LAB — CMP14+EGFR
A/G RATIO: 1.5 (ref 1.2–2.2)
ALK PHOS: 113 IU/L (ref 39–117)
ALT: 24 IU/L (ref 0–44)
AST: 19 IU/L (ref 0–40)
Albumin: 4.1 g/dL (ref 3.5–5.5)
BUN / CREAT RATIO: 9 (ref 9–20)
BUN: 6 mg/dL (ref 6–24)
CHLORIDE: 98 mmol/L (ref 96–106)
CO2: 23 mmol/L (ref 20–29)
Calcium: 9.7 mg/dL (ref 8.7–10.2)
Creatinine, Ser: 0.69 mg/dL — ABNORMAL LOW (ref 0.76–1.27)
GFR calc non Af Amer: 105 mL/min/{1.73_m2} (ref >59)
GFR, EST AFRICAN AMERICAN: 121 mL/min/{1.73_m2} (ref >59)
GLUCOSE: 97 mg/dL (ref 65–99)
Globulin, Total: 2.7 g/dL (ref 1.5–4.5)
POTASSIUM: 4.9 mmol/L (ref 3.5–5.2)
Sodium: 141 mmol/L (ref 134–144)
Total Protein: 6.8 g/dL (ref 6.0–8.5)

## 2017-10-12 LAB — TSH: TSH: 1.01 u[IU]/mL (ref 0.450–4.500)

## 2017-10-12 LAB — VITAMIN D 25 HYDROXY (VIT D DEFICIENCY, FRACTURES): Vit D, 25-Hydroxy: 14 ng/mL — ABNORMAL LOW (ref 30.0–100.0)

## 2017-10-16 ENCOUNTER — Other Ambulatory Visit: Payer: Self-pay | Admitting: Family

## 2017-10-16 MED ORDER — VITAMIN D (ERGOCALCIFEROL) 1.25 MG (50000 UNIT) PO CAPS: 50000.0000 [IU] | ORAL_CAPSULE | ORAL | 3 refills | Status: DC

## 2017-10-17 ENCOUNTER — Other Ambulatory Visit: Payer: Self-pay | Admitting: Family

## 2017-10-17 DIAGNOSIS — J309 Allergic rhinitis, unspecified: Secondary | ICD-10-CM

## 2017-10-17 DIAGNOSIS — H9313 Tinnitus, bilateral: Secondary | ICD-10-CM

## 2017-10-22 ENCOUNTER — Inpatient Hospital Stay (HOSPITAL_COMMUNITY): Payer: Medicare Other

## 2017-10-22 ENCOUNTER — Emergency Department (HOSPITAL_COMMUNITY): Payer: Medicare Other

## 2017-10-22 ENCOUNTER — Encounter (HOSPITAL_COMMUNITY): Payer: Self-pay | Admitting: Emergency Medicine

## 2017-10-22 ENCOUNTER — Inpatient Hospital Stay (HOSPITAL_COMMUNITY)
Admission: EM | Admit: 2017-10-22 | Discharge: 2017-10-24 | DRG: 065 | Disposition: A | Discharge status: Home / Self Care | Payer: Medicare Other | Attending: Internal Medicine | Admitting: Internal Medicine

## 2017-10-22 ENCOUNTER — Other Ambulatory Visit: Payer: Self-pay

## 2017-10-22 DIAGNOSIS — J439 Emphysema, unspecified: Secondary | ICD-10-CM | POA: Diagnosis not present

## 2017-10-22 DIAGNOSIS — R739 Hyperglycemia, unspecified: Secondary | ICD-10-CM | POA: Diagnosis present

## 2017-10-22 DIAGNOSIS — G4733 Obstructive sleep apnea (adult) (pediatric): Secondary | ICD-10-CM | POA: Diagnosis present

## 2017-10-22 DIAGNOSIS — F329 Major depressive disorder, single episode, unspecified: Secondary | ICD-10-CM | POA: Diagnosis present

## 2017-10-22 DIAGNOSIS — R55 Syncope and collapse: Secondary | ICD-10-CM | POA: Diagnosis present

## 2017-10-22 DIAGNOSIS — M79602 Pain in left arm: Secondary | ICD-10-CM | POA: Diagnosis not present

## 2017-10-22 DIAGNOSIS — I639 Cerebral infarction, unspecified: Secondary | ICD-10-CM | POA: Diagnosis not present

## 2017-10-22 DIAGNOSIS — G894 Chronic pain syndrome: Secondary | ICD-10-CM | POA: Diagnosis present

## 2017-10-22 DIAGNOSIS — R471 Dysarthria and anarthria: Secondary | ICD-10-CM | POA: Diagnosis present

## 2017-10-22 DIAGNOSIS — S0081XA Abrasion of other part of head, initial encounter: Secondary | ICD-10-CM | POA: Diagnosis present

## 2017-10-22 DIAGNOSIS — Z720 Tobacco use: Secondary | ICD-10-CM | POA: Diagnosis not present

## 2017-10-22 DIAGNOSIS — F419 Anxiety disorder, unspecified: Secondary | ICD-10-CM | POA: Diagnosis present

## 2017-10-22 DIAGNOSIS — I6381 Other cerebral infarction due to occlusion or stenosis of small artery: Principal | ICD-10-CM | POA: Diagnosis present

## 2017-10-22 DIAGNOSIS — I10 Essential (primary) hypertension: Secondary | ICD-10-CM | POA: Diagnosis present

## 2017-10-22 DIAGNOSIS — Z880 Allergy status to penicillin: Secondary | ICD-10-CM

## 2017-10-22 DIAGNOSIS — G500 Trigeminal neuralgia: Secondary | ICD-10-CM | POA: Diagnosis present

## 2017-10-22 DIAGNOSIS — J449 Chronic obstructive pulmonary disease, unspecified: Secondary | ICD-10-CM | POA: Diagnosis present

## 2017-10-22 DIAGNOSIS — R29704 NIHSS score 4: Secondary | ICD-10-CM | POA: Diagnosis present

## 2017-10-22 DIAGNOSIS — G8194 Hemiplegia, unspecified affecting left nondominant side: Secondary | ICD-10-CM | POA: Diagnosis present

## 2017-10-22 DIAGNOSIS — R9431 Abnormal electrocardiogram [ECG] [EKG]: Secondary | ICD-10-CM | POA: Diagnosis present

## 2017-10-22 DIAGNOSIS — R269 Unspecified abnormalities of gait and mobility: Secondary | ICD-10-CM | POA: Diagnosis present

## 2017-10-22 DIAGNOSIS — Z23 Encounter for immunization: Secondary | ICD-10-CM

## 2017-10-22 DIAGNOSIS — F17210 Nicotine dependence, cigarettes, uncomplicated: Secondary | ICD-10-CM | POA: Diagnosis present

## 2017-10-22 DIAGNOSIS — Z79899 Other long term (current) drug therapy: Secondary | ICD-10-CM

## 2017-10-22 DIAGNOSIS — I4510 Unspecified right bundle-branch block: Secondary | ICD-10-CM | POA: Diagnosis not present

## 2017-10-22 DIAGNOSIS — I452 Bifascicular block: Secondary | ICD-10-CM | POA: Diagnosis present

## 2017-10-22 DIAGNOSIS — R062 Wheezing: Secondary | ICD-10-CM | POA: Diagnosis present

## 2017-10-22 DIAGNOSIS — I6523 Occlusion and stenosis of bilateral carotid arteries: Secondary | ICD-10-CM | POA: Diagnosis not present

## 2017-10-22 DIAGNOSIS — R002 Palpitations: Secondary | ICD-10-CM | POA: Diagnosis present

## 2017-10-22 DIAGNOSIS — E663 Overweight: Secondary | ICD-10-CM | POA: Diagnosis present

## 2017-10-22 DIAGNOSIS — Z6835 Body mass index (BMI) 35.0-35.9, adult: Secondary | ICD-10-CM

## 2017-10-22 DIAGNOSIS — Z8673 Personal history of transient ischemic attack (TIA), and cerebral infarction without residual deficits: Secondary | ICD-10-CM | POA: Diagnosis present

## 2017-10-22 DIAGNOSIS — J441 Chronic obstructive pulmonary disease with (acute) exacerbation: Secondary | ICD-10-CM | POA: Diagnosis not present

## 2017-10-22 DIAGNOSIS — E785 Hyperlipidemia, unspecified: Secondary | ICD-10-CM | POA: Diagnosis present

## 2017-10-22 DIAGNOSIS — R200 Anesthesia of skin: Secondary | ICD-10-CM | POA: Diagnosis present

## 2017-10-22 DIAGNOSIS — I69952 Hemiplegia and hemiparesis following unspecified cerebrovascular disease affecting left dominant side: Secondary | ICD-10-CM | POA: Diagnosis not present

## 2017-10-22 DIAGNOSIS — W19XXXA Unspecified fall, initial encounter: Secondary | ICD-10-CM | POA: Diagnosis not present

## 2017-10-22 DIAGNOSIS — R29810 Facial weakness: Secondary | ICD-10-CM | POA: Diagnosis present

## 2017-10-22 DIAGNOSIS — Z7951 Long term (current) use of inhaled steroids: Secondary | ICD-10-CM

## 2017-10-22 DIAGNOSIS — R0689 Other abnormalities of breathing: Secondary | ICD-10-CM | POA: Diagnosis not present

## 2017-10-22 DIAGNOSIS — E782 Mixed hyperlipidemia: Secondary | ICD-10-CM | POA: Diagnosis not present

## 2017-10-22 LAB — APTT: aPTT: 26 seconds (ref 24–36)

## 2017-10-22 LAB — URINALYSIS, ROUTINE W REFLEX MICROSCOPIC
Bilirubin Urine: NEGATIVE
GLUCOSE, UA: NEGATIVE mg/dL
HGB URINE DIPSTICK: NEGATIVE
KETONES UR: NEGATIVE mg/dL
Leukocytes, UA: NEGATIVE
Nitrite: NEGATIVE
PROTEIN: NEGATIVE mg/dL
Specific Gravity, Urine: 1.005 (ref 1.005–1.030)
pH: 6 (ref 5.0–8.0)

## 2017-10-22 LAB — D-DIMER, QUANTITATIVE (NOT AT ARMC): D DIMER QUANT: 0.3 ug{FEU}/mL (ref 0.00–0.50)

## 2017-10-22 LAB — RAPID URINE DRUG SCREEN, HOSP PERFORMED
Amphetamines: NOT DETECTED
BARBITURATES: NOT DETECTED
BENZODIAZEPINES: NOT DETECTED
COCAINE: NOT DETECTED
Opiates: NOT DETECTED
TETRAHYDROCANNABINOL: NOT DETECTED

## 2017-10-22 LAB — COMPREHENSIVE METABOLIC PANEL
ALBUMIN: 4 g/dL (ref 3.5–5.0)
ALT: 25 U/L (ref 0–44)
AST: 23 U/L (ref 15–41)
Alkaline Phosphatase: 108 U/L (ref 38–126)
Anion gap: 9 (ref 5–15)
BILIRUBIN TOTAL: 0.4 mg/dL (ref 0.3–1.2)
BUN: 10 mg/dL (ref 6–20)
CHLORIDE: 100 mmol/L (ref 98–111)
CO2: 27 mmol/L (ref 22–32)
CREATININE: 0.6 mg/dL — AB (ref 0.61–1.24)
Calcium: 8.7 mg/dL — ABNORMAL LOW (ref 8.9–10.3)
GFR calc Af Amer: >60 mL/min (ref >60)
GLUCOSE: 143 mg/dL — AB (ref 70–99)
Potassium: 3.6 mmol/L (ref 3.5–5.1)
Sodium: 136 mmol/L (ref 135–145)
TOTAL PROTEIN: 7.5 g/dL (ref 6.5–8.1)

## 2017-10-22 LAB — GLUCOSE, CAPILLARY: Glucose-Capillary: 154 mg/dL — ABNORMAL HIGH (ref 70–99)

## 2017-10-22 LAB — I-STAT TROPONIN, ED: TROPONIN I, POC: 0.02 ng/mL (ref 0.00–0.08)

## 2017-10-22 LAB — DIFFERENTIAL
BASOS ABS: 0 10*3/uL (ref 0.0–0.1)
Basophils Relative: 0 %
Eosinophils Absolute: 0.1 10*3/uL (ref 0.0–0.7)
Eosinophils Relative: 1 %
LYMPHS ABS: 1.4 10*3/uL (ref 0.7–4.0)
Lymphocytes Relative: 19 %
MONOS PCT: 6 %
Monocytes Absolute: 0.5 10*3/uL (ref 0.1–1.0)
NEUTROS ABS: 5.6 10*3/uL (ref 1.7–7.7)
NEUTROS PCT: 74 %

## 2017-10-22 LAB — CBC
HEMATOCRIT: 43 % (ref 39.0–52.0)
HEMOGLOBIN: 14.6 g/dL (ref 13.0–17.0)
MCH: 31 pg (ref 26.0–34.0)
MCHC: 34 g/dL (ref 30.0–36.0)
MCV: 91.3 fL (ref 78.0–100.0)
Platelets: 205 10*3/uL (ref 150–400)
RBC: 4.71 MIL/uL (ref 4.22–5.81)
RDW: 13 % (ref 11.5–15.5)
WBC: 7.6 10*3/uL (ref 4.0–10.5)

## 2017-10-22 LAB — PHOSPHORUS: Phosphorus: 3.5 mg/dL (ref 2.5–4.6)

## 2017-10-22 LAB — PROTIME-INR
INR: 0.87
Prothrombin Time: 11.7 seconds (ref 11.4–15.2)

## 2017-10-22 LAB — MAGNESIUM: MAGNESIUM: 2 mg/dL (ref 1.7–2.4)

## 2017-10-22 MED ORDER — ALPRAZOLAM 0.25 MG PO TABS
0.2500 mg | ORAL_TABLET | Freq: Once | ORAL | Status: AC
  Administered 2017-10-22: 0.25 mg via ORAL
  Filled 2017-10-22: qty 1

## 2017-10-22 MED ORDER — ALBUTEROL SULFATE (2.5 MG/3ML) 0.083% IN NEBU: 2.5000 mg | INHALATION_SOLUTION | Freq: Four times a day (QID) | RESPIRATORY_TRACT | Status: DC | PRN

## 2017-10-22 MED ORDER — SODIUM CHLORIDE 0.9 % IV BOLUS
500.0000 mL | Freq: Once | INTRAVENOUS | Status: AC
  Administered 2017-10-22: 500 mL via INTRAVENOUS

## 2017-10-22 MED ORDER — GABAPENTIN 600 MG PO TABS
600.0000 mg | ORAL_TABLET | Freq: Three times a day (TID) | ORAL | Status: DC
  Filled 2017-10-22 (×5): qty 1

## 2017-10-22 MED ORDER — INSULIN ASPART 100 UNIT/ML ~~LOC~~ SOLN: 0.0000 [IU] | Freq: Three times a day (TID) | SUBCUTANEOUS | Status: DC

## 2017-10-22 MED ORDER — FLUTICASONE PROPIONATE 50 MCG/ACT NA SUSP
2.0000 | Freq: Every day | NASAL | Status: DC
  Administered 2017-10-23 – 2017-10-24 (×2): 2 via NASAL
  Filled 2017-10-22 (×2): qty 16

## 2017-10-22 MED ORDER — ONDANSETRON HCL 4 MG/2ML IJ SOLN: 4.0000 mg | Freq: Four times a day (QID) | INTRAMUSCULAR | Status: DC | PRN

## 2017-10-22 MED ORDER — CARBAMAZEPINE 200 MG PO TABS
400.0000 mg | ORAL_TABLET | Freq: Four times a day (QID) | ORAL | Status: DC
  Administered 2017-10-22 – 2017-10-24 (×6): 400 mg via ORAL
  Filled 2017-10-22 (×6): qty 2

## 2017-10-22 MED ORDER — INFLUENZA VAC SPLIT QUAD 0.5 ML IM SUSY
0.5000 mL | PREFILLED_SYRINGE | INTRAMUSCULAR | Status: AC
  Administered 2017-10-23: 0.5 mL via INTRAMUSCULAR
  Filled 2017-10-22: qty 0.5

## 2017-10-22 MED ORDER — ENOXAPARIN SODIUM 40 MG/0.4ML ~~LOC~~ SOLN
40.0000 mg | SUBCUTANEOUS | Status: DC
  Administered 2017-10-22: 40 mg via SUBCUTANEOUS
  Filled 2017-10-22: qty 0.4

## 2017-10-22 MED ORDER — ACETAMINOPHEN 325 MG PO TABS: 650.0000 mg | ORAL_TABLET | ORAL | Status: DC | PRN

## 2017-10-22 MED ORDER — GABAPENTIN 300 MG PO CAPS
600.0000 mg | ORAL_CAPSULE | Freq: Three times a day (TID) | ORAL | Status: DC
  Administered 2017-10-22 – 2017-10-24 (×5): 600 mg via ORAL
  Filled 2017-10-22 (×5): qty 2

## 2017-10-22 MED ORDER — ENOXAPARIN SODIUM 40 MG/0.4ML ~~LOC~~ SOLN: 40.0000 mg | SUBCUTANEOUS | Status: DC

## 2017-10-22 MED ORDER — FLUTICASONE-UMECLIDIN-VILANT 100-62.5-25 MCG/INH IN AEPB: 1.0000 | INHALATION_SPRAY | Freq: Every day | RESPIRATORY_TRACT | Status: DC

## 2017-10-22 MED ORDER — ONDANSETRON HCL 4 MG PO TABS: 4.0000 mg | ORAL_TABLET | Freq: Four times a day (QID) | ORAL | Status: DC | PRN

## 2017-10-22 MED ORDER — ACETAMINOPHEN 650 MG RE SUPP: 650.0000 mg | RECTAL | Status: DC | PRN

## 2017-10-22 MED ORDER — NICOTINE 21 MG/24HR TD PT24
21.0000 mg | MEDICATED_PATCH | Freq: Every day | TRANSDERMAL | Status: DC | PRN
  Administered 2017-10-22: 21 mg via TRANSDERMAL
  Filled 2017-10-22: qty 1

## 2017-10-22 MED ORDER — ASPIRIN 300 MG RE SUPP: 300.0000 mg | Freq: Every day | RECTAL | Status: DC

## 2017-10-22 MED ORDER — SODIUM CHLORIDE 0.9 % IV SOLN
100.0000 mL/h | INTRAVENOUS | Status: DC
  Administered 2017-10-22 – 2017-10-23 (×3): 100 mL/h via INTRAVENOUS

## 2017-10-22 MED ORDER — STROKE: EARLY STAGES OF RECOVERY BOOK
Freq: Once | Status: AC
  Administered 2017-10-22: 23:00:00
  Filled 2017-10-22 (×2): qty 1

## 2017-10-22 MED ORDER — ASPIRIN 325 MG PO TABS
325.0000 mg | ORAL_TABLET | Freq: Every day | ORAL | Status: DC
  Administered 2017-10-22 – 2017-10-24 (×3): 325 mg via ORAL
  Filled 2017-10-22 (×3): qty 1

## 2017-10-22 MED ORDER — ACETAMINOPHEN 160 MG/5ML PO SOLN: 650.0000 mg | ORAL | Status: DC | PRN

## 2017-10-22 MED ORDER — ALBUTEROL SULFATE HFA 108 (90 BASE) MCG/ACT IN AERS: 2.0000 | INHALATION_SPRAY | Freq: Four times a day (QID) | RESPIRATORY_TRACT | Status: DC | PRN

## 2017-10-22 NOTE — ED Notes (Signed)
MD at bedside. 

## 2017-10-22 NOTE — ED Notes (Signed)
Pt to MRI

## 2017-10-22 NOTE — ED Triage Notes (Signed)
Per EMS pt was outside sitting on scooter and started to have numbness in left arm and left leg. Denies chest pain. Reports weakness in bilateral legs. Scratch to forward with minimal bleeding unable to report what happened.

## 2017-10-22 NOTE — ED Provider Notes (Signed)
[facility] EMERGENCY DEPARTMENT Provider Note   CSN: [id number] Arrival date & time: 10/22/17  1354     History   Chief Complaint Chief Complaint  Patient presents with  . Arm Pain    HPI Matthew [name] is a 57 y.o. male.  HPI Patient presents after an episode of left arm pain + numbness and possible syncope. Patient states that he is in the usual state of health, when he felt suddenly different from normal, with pain in the left shoulder, arm, radiating superiorly, lightheadedness, left arm numbness, heaviness, and while going inside to rest, family members reported the patient lost consciousness, striking his head. Patient denies chest pain, dyspnea, does report that prior to losing consciousness he checked his blood pressure found to be elevated, with a systolic greater than 539. He states that he takes all medication as directed, but continues to smoke cigarettes as well. After awakening, patient did not have recall about what happened specifically, complained of persistent pain in his left arm, numbness in the left leg, difficulty with ambulation, but no confusion, vomiting, neck pain.  He is here with a friend who assists with the HPI.  Past Medical History:  Diagnosis Date  . Abscess   . Anxiety   . Aphthous ulcer   . Arthritis   . Asthma   . Chronic bronchitis   . Chronic pain   . COPD (chronic obstructive pulmonary disease) ([place])   . Depression   . Hyperlipidemia   . Hypertension   . Trigeminal neuralgia   . Vertigo     Patient Active Problem List   Diagnosis Date Noted  . Depression 12/25/2016  . Abnormal drug screen 11/02/2016  . Pain management contract broken 03/09/2016  . Coronary artery calcification 01/18/2016  . Thoracic aortic atherosclerosis ([place]) 01/18/2016  . Chronic back pain 08/03/2015  . Opioid dependence ([place]) 08/03/2015  . Obesity (BMI 30-39.9) 03/09/2015  . Smoker 02/02/2015  . Erectile dysfunction 08/24/2014  . Vitamin D deficiency  04/18/2014  . Hyperlipidemia 04/18/2014  . COPD (chronic obstructive pulmonary disease) ([place]) 04/17/2014  . Weakness 08/22/2012  . Chronic pain syndrome 08/19/2012  . Adjustment disorder with mixed anxiety and depressed mood 05/11/2012  . Trigeminal neuralgia 05/11/2012  . HTN (hypertension) 05/11/2012    Past Surgical History:  Procedure Laterality Date  . CHOLECYSTECTOMY    . HAND RECONSTRUCTION Right 1990's        Home Medications    Prior to Admission medications   Medication Sig Start Date End Date Taking? Authorizing Provider  albuterol (VENTOLIN HFA) 108 (90 Base) MCG/ACT inhaler INHALE 2 PUFFS INTO LUNGS EVERY 6 HOURS AS NEEDED FOR WHEEZE OR SHORTNESS OF BREATH 09/24/17   Hawks, Christy A, FNP  amLODipine (NORVASC) 10 MG tablet TAKE 1 TABLET BY MOUTH EVERY DAY Patient taking differently: Take 10 mg by mouth daily.  05/11/17   Evelina Dun A, FNP  atorvastatin (LIPITOR) 40 MG tablet TAKE 1 TABLET EVERY EVENING Patient taking differently: Take 40 mg by mouth every evening.  09/22/16   [name], FNP  carbamazepine (TEGRETOL) 200 MG tablet TAKE 2 TABLETS BY MOUTH 4 TIMES A DAY Patient taking differently: Take 400 mg by mouth 4 (four) times daily.  04/02/17   Evelina Dun A, FNP  fluticasone (FLONASE) 50 MCG/ACT nasal spray USE 2 SPRAYS INTO BOTH NOSTRILS ONCE DAILY Patient taking differently: Place 2 sprays into both nostrils daily.  10/18/17   [name], FNP  Fluticasone-Umeclidin-Vilant (TRELEGY ELLIPTA) 100-62.5-25  MCG/INH AEPB Inhale 1 puff into the lungs daily. 07/12/17   [name], FNP  gabapentin (NEURONTIN) 600 MG tablet Take 1 tablet in the morning, 1 tablet in the afternoon, and 1.5 tablet at bedtime Patient taking differently: Take 600-900 mg by mouth See admin instructions. Take 1 tablet in the morning, 1 tablet in the afternoon, and 1.5 tablet at bedtime 07/16/17   Narda Amber K, DO  hydrochlorothiazide (MICROZIDE) 12.5 MG capsule TAKE 1 CAPSULE  BY MOUTH EVERY DAY Patient taking differently: Take 12.5 mg by mouth daily.  05/11/17   Evelina Dun A, FNP  lisinopril-hydrochlorothiazide (ZESTORETIC) 20-12.5 MG tablet Take 1 tablet by mouth daily. 10/11/17   [name], FNP  Vitamin D, Ergocalciferol, (DRISDOL) 50000 units CAPS capsule Take 1 capsule (50,000 Units total) by mouth every 7 (seven) days. 10/16/17   [name], FNP    Family History Family History  Problem Relation Age of Onset  . Hypertension Mother   . Diabetes Mother   . Hypertension Father   . Diabetes Father     Social History Social History   Tobacco Use  . Smoking status: Current Every Day Smoker    Packs/day: 2.00    Years: 40.00    Pack years: 80.00    Types: Cigarettes    Start date: 07/23/1975  . Smokeless tobacco: Never Used  Substance Use Topics  . Alcohol use: Yes    Comment: 08/21/2012 "quit drinking ~ 1985"  . Drug use: No     Allergies   Penicillins   Review of Systems Review of Systems  Constitutional:       Per HPI, otherwise negative  HENT:       Per HPI, otherwise negative  Respiratory:       Per HPI, otherwise negative  Cardiovascular:       Per HPI, otherwise negative  Gastrointestinal: Negative for vomiting.  Endocrine:       Negative aside from HPI  Genitourinary:       Neg aside from HPI   Musculoskeletal:       Per HPI, otherwise negative  Skin: Negative.   Neurological: Negative for syncope.     Physical Exam Updated Vital Signs BP 139/69   Pulse (!) 59   Resp 16   Wt 108 kg   SpO2 97%   BMI 35.16 kg/m   Physical Exam  Constitutional: He is oriented to person, place, and time. He appears well-developed. No distress.  HENT:  Head: Normocephalic and atraumatic.  Eyes: Conjunctivae and EOM are normal.  Cardiovascular: Normal rate and regular rhythm.  Pulmonary/Chest: Effort normal. No stridor. No respiratory distress.  Abdominal: He exhibits no distension.  Musculoskeletal: He exhibits no  edema.  Neurological: He is alert and oriented to person, place, and time. He displays no tremor. He displays no seizure activity. Gait abnormal. Coordination normal.  Strength 4/5 upper and lower extremity, though the patient seems to be uncomfortable with attempting to do this maneuver.  Skin: Skin is warm and dry.  Psychiatric: He has a normal mood and affect.  Nursing note and vitals reviewed.    ED Treatments / Results  Labs (all labs ordered are listed, but only abnormal results are displayed) Labs Reviewed  COMPREHENSIVE METABOLIC PANEL - Abnormal; Notable for the following components:      Result Value   Glucose, Bld 143 (*)    Creatinine, Ser 0.60 (*)    Calcium 8.7 (*)    All other  components within normal limits  URINALYSIS, ROUTINE W REFLEX MICROSCOPIC - Abnormal; Notable for the following components:   Color, Urine STRAW (*)    All other components within normal limits  PROTIME-INR  APTT  CBC  DIFFERENTIAL  RAPID URINE DRUG SCREEN, HOSP PERFORMED  D-DIMER, QUANTITATIVE (NOT AT [facility])  I-STAT TROPONIN, ED    EKG EKG Interpretation  Date/Time:  Monday October 22 2017 14:06:33 EDT Ventricular Rate:  68 PR Interval:    QRS Duration: 151 QT Interval:  439 QTC Calculation: 467 R Axis:   -80 Text Interpretation:  Sinus rhythm RBBB and LAFB Probable left ventricular hypertrophy Abnormal ekg Confirmed by Carmin Muskrat [id number]) on 10/22/2017 2:42:20 PM   Radiology Ct Head Wo Contrast  Result Date: 10/22/2017 CLINICAL DATA:  Left leg and left arm numbness EXAM: CT HEAD WITHOUT CONTRAST TECHNIQUE: Contiguous axial images were obtained from the base of the skull through the vertex without intravenous contrast. COMPARISON:  08/21/2012 FINDINGS: Brain: Chronic white matter ischemia is noted and stable from the prior exam. No findings to suggest acute hemorrhage, acute infarction or space-occupying mass lesion are noted. Vascular: Vertebrobasilar ectasia is again identified  and stable. Skull: Normal. Negative for fracture or focal lesion. Sinuses/Orbits: No acute finding. Other: None. IMPRESSION: Chronic white matter ischemic changes are noted. No acute abnormality is identified. Electronically Signed   By: Inez Catalina M.D.   On: 10/22/2017 16:06   Mr Jodene Nam Head Wo Contrast  Result Date: 10/22/2017 CLINICAL DATA:  Initial evaluation for acute left-sided numbness for 1 day. EXAM: MRI HEAD WITHOUT CONTRAST MRA HEAD WITHOUT CONTRAST TECHNIQUE: Multiplanar, multiecho pulse sequences of the brain and surrounding structures were obtained without intravenous contrast. Angiographic images of the head were obtained using MRA technique without contrast. COMPARISON:  Prior CT from earlier same day. FINDINGS: MRI HEAD FINDINGS Brain: Examination limited by motion artifact. Additionally, patient was unable to tolerate the full length of the exam. Diffuse prominence of the CSF containing spaces compatible with generalized age-related cerebral atrophy. Patchy and confluent T2/FLAIR hyperintensity within the periventricular deep white matter both cerebral hemispheres most consistent with chronic small vessel ischemic disease, moderate in nature. Remote lacunar infarct present within the left thalamus. Approximate 15 mm focus of curvilinear restricted diffusion extending from the posterior right lentiform nucleus/internal capsule towards the lateral right thalamus, consistent with a small acute ischemic lacunar type infarct (series 3, images 84, 86). No obvious associated hemorrhage on this limited exam. Additional 9 mm focus of vague diffusion abnormality at the right periventricular white matter superiorly (series 3, image 95), likely subacute small vessel ischemic changes. No other evidence for acute or subacute ischemia. Gray-white matter differentiation otherwise maintained. No areas of remote cortical infarction. No mass lesion, midline shift or mass effect. No hydrocephalus. No extra-axial  fluid collection. Pituitary gland normal. Vascular: Hypoplastic right vertebral artery not well visualized. Remainder of the major intracranial vascular flow voids maintained at the skull base. Skull and upper cervical spine: Craniocervical junction normal. No focal marrow replacing lesion. No scalp soft tissue abnormality. Sinuses/Orbits: Globes and orbital soft tissues within normal limits. Scattered mucosal thickening within the ethmoidal air cells and maxillary sinuses. Paranasal sinuses are otherwise clear. No significant mastoid effusion. Other: None. MRA HEAD FINDINGS ANTERIOR CIRCULATION: Examination moderately degraded by motion artifact. Distal cervical segments of the internal carotid arteries are patent with antegrade flow. Petrous, cavernous, and supraclinoid segments patent without hemodynamically significant stenosis A1 segments patent bilaterally. Right A1 hypoplastic. Anterior communicating artery grossly normal.  Anterior cerebral arteries patent to their distal aspects without obvious stenosis on this motion degraded exam. M1 segments patent bilaterally. No obvious proximal M2 occlusion. Distal MCA branches well perfused and fairly symmetric. POSTERIOR CIRCULATION: Left vertebral artery dominant and diffusely tortuous, crossing the midline prior to the vertebrobasilar junction. Right vertebral artery hypoplastic but patent as well. Left PICA patent. Right PICA not visualized. Basilar tortuous but grossly patent without stenosis, although evaluation limited by motion. Superior cerebral arteries not well assessed on this exam due to motion. Both of the posterior cerebral arteries appear to be primarily supplied via the basilar. PCAs patent proximally, not well assessed distally due to motion. No obvious intracranial aneurysm on this motion degraded study. IMPRESSION: MRI HEAD IMPRESSION: 1. Limited exam due to patient's inability to tolerate the full length of the study as well as extensive motion  artifact. 2. 15 mm acute ischemic lacunar type infarct involving the posterior right basal ganglia. 3. Additional 9 mm focus of diffusion abnormality involving the right periventricular white matter, most consistent with sequelae of subacute small vessel ischemic changes. 4. Atrophy with moderate chronic small vessel ischemic disease. MRA HEAD IMPRESSION: 1. Technically limited exam due to extensive motion artifact. 2. Negative intracranial MRA for large vessel occlusion. No obvious hemodynamically significant or correctable stenosis identified. Electronically Signed   By: Jeannine Boga M.D.   On: 10/22/2017 19:58   Mr Brain Wo Contrast  Result Date: 10/22/2017 CLINICAL DATA:  Initial evaluation for acute left-sided numbness for 1 day. EXAM: MRI HEAD WITHOUT CONTRAST MRA HEAD WITHOUT CONTRAST TECHNIQUE: Multiplanar, multiecho pulse sequences of the brain and surrounding structures were obtained without intravenous contrast. Angiographic images of the head were obtained using MRA technique without contrast. COMPARISON:  Prior CT from earlier same day. FINDINGS: MRI HEAD FINDINGS Brain: Examination limited by motion artifact. Additionally, patient was unable to tolerate the full length of the exam. Diffuse prominence of the CSF containing spaces compatible with generalized age-related cerebral atrophy. Patchy and confluent T2/FLAIR hyperintensity within the periventricular deep white matter both cerebral hemispheres most consistent with chronic small vessel ischemic disease, moderate in nature. Remote lacunar infarct present within the left thalamus. Approximate 15 mm focus of curvilinear restricted diffusion extending from the posterior right lentiform nucleus/internal capsule towards the lateral right thalamus, consistent with a small acute ischemic lacunar type infarct (series 3, images 84, 86). No obvious associated hemorrhage on this limited exam. Additional 9 mm focus of vague diffusion abnormality at  the right periventricular white matter superiorly (series 3, image 95), likely subacute small vessel ischemic changes. No other evidence for acute or subacute ischemia. Gray-white matter differentiation otherwise maintained. No areas of remote cortical infarction. No mass lesion, midline shift or mass effect. No hydrocephalus. No extra-axial fluid collection. Pituitary gland normal. Vascular: Hypoplastic right vertebral artery not well visualized. Remainder of the major intracranial vascular flow voids maintained at the skull base. Skull and upper cervical spine: Craniocervical junction normal. No focal marrow replacing lesion. No scalp soft tissue abnormality. Sinuses/Orbits: Globes and orbital soft tissues within normal limits. Scattered mucosal thickening within the ethmoidal air cells and maxillary sinuses. Paranasal sinuses are otherwise clear. No significant mastoid effusion. Other: None. MRA HEAD FINDINGS ANTERIOR CIRCULATION: Examination moderately degraded by motion artifact. Distal cervical segments of the internal carotid arteries are patent with antegrade flow. Petrous, cavernous, and supraclinoid segments patent without hemodynamically significant stenosis A1 segments patent bilaterally. Right A1 hypoplastic. Anterior communicating artery grossly normal. Anterior cerebral arteries patent to their distal  aspects without obvious stenosis on this motion degraded exam. M1 segments patent bilaterally. No obvious proximal M2 occlusion. Distal MCA branches well perfused and fairly symmetric. POSTERIOR CIRCULATION: Left vertebral artery dominant and diffusely tortuous, crossing the midline prior to the vertebrobasilar junction. Right vertebral artery hypoplastic but patent as well. Left PICA patent. Right PICA not visualized. Basilar tortuous but grossly patent without stenosis, although evaluation limited by motion. Superior cerebral arteries not well assessed on this exam due to motion. Both of the posterior  cerebral arteries appear to be primarily supplied via the basilar. PCAs patent proximally, not well assessed distally due to motion. No obvious intracranial aneurysm on this motion degraded study. IMPRESSION: MRI HEAD IMPRESSION: 1. Limited exam due to patient's inability to tolerate the full length of the study as well as extensive motion artifact. 2. 15 mm acute ischemic lacunar type infarct involving the posterior right basal ganglia. 3. Additional 9 mm focus of diffusion abnormality involving the right periventricular white matter, most consistent with sequelae of subacute small vessel ischemic changes. 4. Atrophy with moderate chronic small vessel ischemic disease. MRA HEAD IMPRESSION: 1. Technically limited exam due to extensive motion artifact. 2. Negative intracranial MRA for large vessel occlusion. No obvious hemodynamically significant or correctable stenosis identified. Electronically Signed   By: Jeannine Boga M.D.   On: 10/22/2017 19:58    Procedures Procedures (including critical care time)  Medications Ordered in ED Medications  sodium chloride 0.9 % bolus 500 mL (0 mLs Intravenous Stopped 10/22/17 1717)    Followed by  0.9 %  sodium chloride infusion (0 mL/hr Intravenous Stopped 10/22/17 1754)     Initial Impression / Assessment and Plan / ED Course  I have reviewed the triage vital signs and the nursing notes.  Pertinent labs & imaging results that were available during my care of the patient were reviewed by me and considered in my medical decision making (see chart for details).    On repeat exam after the initial studies were reassuring, no evidence for ACS, patient continued to complain of pain, but given the asymmetry of strength, though the initial CT was reassuring, MRI was ordered. Patient notes that his numbness has improved 8:12 PM She notes that his numbness has resolved, and essentially feels back to normal. This repeat evaluation occurred not long after he  spoke with our radiologist about the patient's MRI result, concerning for lacunar infarct. Given his resolution of symptoms, though with abnormal MRI, there is concern for stroke. With no ongoing symptoms, patient is not a candidate for TPA, but given this episode, need for further evaluation, management, I discussed his case with our neurology colleagues, subsequently with our internal medicine colleagues, the patient was admitted for further evaluation and management.  Final Clinical Impressions(s) / ED Diagnoses  Acute stroke, initial encounter   Carmin Muskrat, MD 10/22/17 2055

## 2017-10-22 NOTE — H&P (Signed)
History and Physical    Matthew [name] EUM:[id number] DOB: 1960-09-26 DOA: 10/22/2017  PCP: [name], FNP   Patient coming from: Home.  I have personally briefly reviewed patient's old medical records in [place]  Chief Complaint: Left arm and left leg numbness.  HPI: Matthew [name] is a 57 y.o. male with medical history significant of abscess, anxiety, depression, aphthous ulcer, arthritis, COPD, trigeminal neuralgia, chronic pain syndrome, hyperlipidemia, hypertension, vertigo who is coming to the emergency department due to LUE and LLE numbness earlier this evening shortly after he had a hot dinner.  He denies headache, blurred vision, slurred speech, nausea or emesis, but has some difficulty ambulating due to LLE weakness.  Since then, the numbness has disappeared.  He denies fever, chills, sore throat, dyspnea or hemoptysis.  However, he has frequent wheezing.  No chest pain, diaphoresis, dizziness, PND, orthopnea or pitting edema lower extremities.  He complains of occasional palpitations.  He denies abdominal pain, diarrhea, constipation, melena or hematochezia.  He denies dysuria, frequency or hematuria.  No heat or cold intolerance.  Denies polyuria, polydipsia, polyphagia.  Denies skin rashes or pruritus.  ED Course: Initial vital signs in the emergency department, pulse 62, respirations 17, blood pressure 150/89 mmHg and O2 sat 96% on room air.  Work-up shows an unremarkable urinalysis, negative urine drug screen, normal CBC, normal PT/INR/APTT, normal d-dimer, normal i-STAT troponin.  His CMP shows a glucose that is elevated at 143, a low creatinine at 0.60 and low calcium of 8.7 mg/dL.  All other values are normal.    Imaging: CT head showed chronic white matter ischemic changes, but no acute abnormality.  MRI of the head- was limited due to extensive motion artifact.  However there is a 50 mm acute ischemic lacunar type infarct involving the posterior right basal ganglia.   There is also an additional 9 mm focus of diffusion abnormality involving the right periventricular white matter, most consistent with sequelae of subacute small vessel ischemic changes.  There is atrophy with moderate chronic small vessel ischemic disease.  MRA showed negative intracranial MRA for large vessel occlusion without obvious hemodynamically significant or correctable stenosis identified.  Again the study was limited due to extensive motion artifact.  Review of Systems: As per HPI otherwise 10 point review of systems negative.   Past Medical History:  Diagnosis Date  . Abscess   . Anxiety   . Aphthous ulcer   . Arthritis   . Asthma   . Chronic bronchitis   . Chronic pain   . COPD (chronic obstructive pulmonary disease) ([place])   . Depression   . Hyperlipidemia   . Hypertension   . Trigeminal neuralgia   . Vertigo     Past Surgical History:  Procedure Laterality Date  . CHOLECYSTECTOMY    . HAND RECONSTRUCTION Right 1990's     reports that he has been smoking cigarettes. He started smoking about 42 years ago. He has a 80.00 pack-year smoking history. He has never used smokeless tobacco. He reports that he drinks alcohol. He reports that he does not use drugs.  Allergies  Allergen Reactions  . Penicillins     Family History  Problem Relation Age of Onset  . Hypertension Mother   . Diabetes Mother   . Hypertension Father   . Diabetes Father    Prior to Admission medications   Medication Sig Start Date End Date Taking? Authorizing Provider  albuterol (VENTOLIN HFA) 108 (90 Base) MCG/ACT inhaler  INHALE 2 PUFFS INTO LUNGS EVERY 6 HOURS AS NEEDED FOR WHEEZE OR SHORTNESS OF BREATH Patient taking differently: Inhale 2 puffs into the lungs every 6 (six) hours as needed for shortness of breath.  09/24/17  Yes Hawks, Christy A, FNP  amLODipine (NORVASC) 10 MG tablet TAKE 1 TABLET BY MOUTH EVERY DAY Patient taking differently: Take 10 mg by mouth daily.  05/11/17  Yes Hawks,  Christy A, FNP  carbamazepine (TEGRETOL) 200 MG tablet TAKE 2 TABLETS BY MOUTH 4 TIMES A DAY Patient taking differently: Take 400 mg by mouth 4 (four) times daily.  04/02/17  Yes Hawks, Christy A, FNP  fluticasone (FLONASE) 50 MCG/ACT nasal spray USE 2 SPRAYS INTO BOTH NOSTRILS ONCE DAILY Patient taking differently: Place 2 sprays into both nostrils daily.  10/18/17  Yes Hawks, Christy A, FNP  Fluticasone-Umeclidin-Vilant (TRELEGY ELLIPTA) 100-62.5-25 MCG/INH AEPB Inhale 1 puff into the lungs daily. 07/12/17  Yes Hawks, Christy A, FNP  gabapentin (NEURONTIN) 600 MG tablet Take 1 tablet in the morning, 1 tablet in the afternoon, and 1.5 tablet at bedtime Patient taking differently: Take 600 mg by mouth 3 (three) times daily.  07/16/17  Yes Patel, Donika K, DO  hydrochlorothiazide (MICROZIDE) 12.5 MG capsule TAKE 1 CAPSULE BY MOUTH EVERY DAY Patient taking differently: Take 12.5 mg by mouth daily.  05/11/17  Yes Hawks, Christy A, FNP  lisinopril-hydrochlorothiazide (ZESTORETIC) 20-12.5 MG tablet Take 1 tablet by mouth daily. 10/11/17  Yes Hawks, Christy A, FNP  Vitamin D, Ergocalciferol, (DRISDOL) 50000 units CAPS capsule Take 1 capsule (50,000 Units total) by mouth every 7 (seven) days. Patient not taking: Reported on 10/22/2017 10/16/17   [name], FNP    Physical Exam: Vitals:   10/22/17 1830 10/22/17 1920 10/22/17 1930 10/22/17 2100  BP: (!) 159/78 (!) 159/91 139/69 112/80  Pulse: (!) 57 60 (!) 59 61  Resp: 12 16 16 16   SpO2: 95% 97% 97% 97%  Weight:        Constitutional: NAD, calm, comfortable Eyes: PERRL, lids and conjunctivae normal ENMT: Mucous membranes are moist. Posterior pharynx clear of any exudate or lesions. Neck: normal, supple, no masses, no thyromegaly Respiratory: Decreased breath sounds with bilateral wheezing, no crackles. Normal respiratory effort. No accessory muscle use.  Cardiovascular: Regular rate and rhythm, no murmurs / rubs / gallops. No extremity edema. 2+  pedal pulses. No carotid bruits.  Abdomen: Obese, soft, no tenderness, no masses palpated. No hepatosplenomegaly. Bowel sounds positive.  Musculoskeletal: no clubbing / cyanosis. No joint deformity upper and lower extremities. Good ROM, no contractures. Normal muscle tone.  Skin: Right side of forehead longitudinal abrasion. Neurologic: Left facial droop, he is unable to normally raise his left eyebrow.  His smile is favoring to the right. All other CN 2-12 grossly intact. Sensation is equal on both right and left, DTR normal. Strength 5/5 in all 4.  Psychiatric: Normal judgment and insight. Alert and oriented x 3. Normal mood.   Labs on Admission: I have personally reviewed following labs and imaging studies  CBC: Recent Labs  Lab 10/22/17 1504  WBC 7.6  NEUTROABS 5.6  HGB 14.6  HCT 43.0  MCV 91.3  PLT 161   Basic Metabolic Panel: Recent Labs  Lab 10/22/17 1504  NA 136  K 3.6  CL 100  CO2 27  GLUCOSE 143*  BUN 10  CREATININE 0.60*  CALCIUM 8.7*   GFR: Estimated Creatinine Clearance: 121.9 mL/min (A) (by C-G formula based on SCr of 0.6 mg/dL (L)).  Liver Function Tests: Recent Labs  Lab 10/22/17 1504  AST 23  ALT 25  ALKPHOS 108  BILITOT 0.4  PROT 7.5  ALBUMIN 4.0   No results for input(s): LIPASE, AMYLASE in the last 168 hours. No results for input(s): AMMONIA in the last 168 hours. Coagulation Profile: Recent Labs  Lab 10/22/17 1504  INR 0.87   Cardiac Enzymes: No results for input(s): CKTOTAL, CKMB, CKMBINDEX, TROPONINI in the last 168 hours. BNP (last 3 results) No results for input(s): PROBNP in the last 8760 hours. HbA1C: No results for input(s): HGBA1C in the last 72 hours. CBG: No results for input(s): GLUCAP in the last 168 hours. Lipid Profile: No results for input(s): CHOL, HDL, LDLCALC, TRIG, CHOLHDL, LDLDIRECT in the last 72 hours. Thyroid Function Tests: No results for input(s): TSH, T4TOTAL, FREET4, T3FREE, THYROIDAB in the last 72  hours. Anemia Panel: No results for input(s): VITAMINB12, FOLATE, FERRITIN, TIBC, IRON, RETICCTPCT in the last 72 hours. Urine analysis:    Component Value Date/Time   COLORURINE STRAW (A) 10/22/2017 1441   APPEARANCEUR CLEAR 10/22/2017 1441   APPEARANCEUR Clear 12/02/2015 1621   LABSPEC 1.005 10/22/2017 1441   PHURINE 6.0 10/22/2017 1441   GLUCOSEU NEGATIVE 10/22/2017 1441   HGBUR NEGATIVE 10/22/2017 1441   BILIRUBINUR NEGATIVE 10/22/2017 1441   BILIRUBINUR Negative 12/02/2015 1621   KETONESUR NEGATIVE 10/22/2017 1441   PROTEINUR NEGATIVE 10/22/2017 1441   UROBILINOGEN 0.2 08/21/2012 1549   NITRITE NEGATIVE 10/22/2017 1441   LEUKOCYTESUR NEGATIVE 10/22/2017 1441   LEUKOCYTESUR Negative 12/02/2015 1621    Radiological Exams on Admission: Ct Head Wo Contrast  Result Date: 10/22/2017 CLINICAL DATA:  Left leg and left arm numbness EXAM: CT HEAD WITHOUT CONTRAST TECHNIQUE: Contiguous axial images were obtained from the base of the skull through the vertex without intravenous contrast. COMPARISON:  08/21/2012 FINDINGS: Brain: Chronic white matter ischemia is noted and stable from the prior exam. No findings to suggest acute hemorrhage, acute infarction or space-occupying mass lesion are noted. Vascular: Vertebrobasilar ectasia is again identified and stable. Skull: Normal. Negative for fracture or focal lesion. Sinuses/Orbits: No acute finding. Other: None. IMPRESSION: Chronic white matter ischemic changes are noted. No acute abnormality is identified. Electronically Signed   By: Inez Catalina M.D.   On: 10/22/2017 16:06   Mr Jodene Nam Head Wo Contrast  Result Date: 10/22/2017 CLINICAL DATA:  Initial evaluation for acute left-sided numbness for 1 day. EXAM: MRI HEAD WITHOUT CONTRAST MRA HEAD WITHOUT CONTRAST TECHNIQUE: Multiplanar, multiecho pulse sequences of the brain and surrounding structures were obtained without intravenous contrast. Angiographic images of the head were obtained using MRA  technique without contrast. COMPARISON:  Prior CT from earlier same day. FINDINGS: MRI HEAD FINDINGS Brain: Examination limited by motion artifact. Additionally, patient was unable to tolerate the full length of the exam. Diffuse prominence of the CSF containing spaces compatible with generalized age-related cerebral atrophy. Patchy and confluent T2/FLAIR hyperintensity within the periventricular deep white matter both cerebral hemispheres most consistent with chronic small vessel ischemic disease, moderate in nature. Remote lacunar infarct present within the left thalamus. Approximate 15 mm focus of curvilinear restricted diffusion extending from the posterior right lentiform nucleus/internal capsule towards the lateral right thalamus, consistent with a small acute ischemic lacunar type infarct (series 3, images 84, 86). No obvious associated hemorrhage on this limited exam. Additional 9 mm focus of vague diffusion abnormality at the right periventricular white matter superiorly (series 3, image 95), likely subacute small vessel ischemic changes. No other evidence  for acute or subacute ischemia. Gray-white matter differentiation otherwise maintained. No areas of remote cortical infarction. No mass lesion, midline shift or mass effect. No hydrocephalus. No extra-axial fluid collection. Pituitary gland normal. Vascular: Hypoplastic right vertebral artery not well visualized. Remainder of the major intracranial vascular flow voids maintained at the skull base. Skull and upper cervical spine: Craniocervical junction normal. No focal marrow replacing lesion. No scalp soft tissue abnormality. Sinuses/Orbits: Globes and orbital soft tissues within normal limits. Scattered mucosal thickening within the ethmoidal air cells and maxillary sinuses. Paranasal sinuses are otherwise clear. No significant mastoid effusion. Other: None. MRA HEAD FINDINGS ANTERIOR CIRCULATION: Examination moderately degraded by motion artifact.  Distal cervical segments of the internal carotid arteries are patent with antegrade flow. Petrous, cavernous, and supraclinoid segments patent without hemodynamically significant stenosis A1 segments patent bilaterally. Right A1 hypoplastic. Anterior communicating artery grossly normal. Anterior cerebral arteries patent to their distal aspects without obvious stenosis on this motion degraded exam. M1 segments patent bilaterally. No obvious proximal M2 occlusion. Distal MCA branches well perfused and fairly symmetric. POSTERIOR CIRCULATION: Left vertebral artery dominant and diffusely tortuous, crossing the midline prior to the vertebrobasilar junction. Right vertebral artery hypoplastic but patent as well. Left PICA patent. Right PICA not visualized. Basilar tortuous but grossly patent without stenosis, although evaluation limited by motion. Superior cerebral arteries not well assessed on this exam due to motion. Both of the posterior cerebral arteries appear to be primarily supplied via the basilar. PCAs patent proximally, not well assessed distally due to motion. No obvious intracranial aneurysm on this motion degraded study. IMPRESSION: MRI HEAD IMPRESSION: 1. Limited exam due to patient's inability to tolerate the full length of the study as well as extensive motion artifact. 2. 15 mm acute ischemic lacunar type infarct involving the posterior right basal ganglia. 3. Additional 9 mm focus of diffusion abnormality involving the right periventricular white matter, most consistent with sequelae of subacute small vessel ischemic changes. 4. Atrophy with moderate chronic small vessel ischemic disease. MRA HEAD IMPRESSION: 1. Technically limited exam due to extensive motion artifact. 2. Negative intracranial MRA for large vessel occlusion. No obvious hemodynamically significant or correctable stenosis identified. Electronically Signed   By: Jeannine Boga M.D.   On: 10/22/2017 19:58   Mr Brain Wo  Contrast  Result Date: 10/22/2017 CLINICAL DATA:  Initial evaluation for acute left-sided numbness for 1 day. EXAM: MRI HEAD WITHOUT CONTRAST MRA HEAD WITHOUT CONTRAST TECHNIQUE: Multiplanar, multiecho pulse sequences of the brain and surrounding structures were obtained without intravenous contrast. Angiographic images of the head were obtained using MRA technique without contrast. COMPARISON:  Prior CT from earlier same day. FINDINGS: MRI HEAD FINDINGS Brain: Examination limited by motion artifact. Additionally, patient was unable to tolerate the full length of the exam. Diffuse prominence of the CSF containing spaces compatible with generalized age-related cerebral atrophy. Patchy and confluent T2/FLAIR hyperintensity within the periventricular deep white matter both cerebral hemispheres most consistent with chronic small vessel ischemic disease, moderate in nature. Remote lacunar infarct present within the left thalamus. Approximate 15 mm focus of curvilinear restricted diffusion extending from the posterior right lentiform nucleus/internal capsule towards the lateral right thalamus, consistent with a small acute ischemic lacunar type infarct (series 3, images 84, 86). No obvious associated hemorrhage on this limited exam. Additional 9 mm focus of vague diffusion abnormality at the right periventricular white matter superiorly (series 3, image 95), likely subacute small vessel ischemic changes. No other evidence for acute or subacute ischemia. Gray-white matter  differentiation otherwise maintained. No areas of remote cortical infarction. No mass lesion, midline shift or mass effect. No hydrocephalus. No extra-axial fluid collection. Pituitary gland normal. Vascular: Hypoplastic right vertebral artery not well visualized. Remainder of the major intracranial vascular flow voids maintained at the skull base. Skull and upper cervical spine: Craniocervical junction normal. No focal marrow replacing lesion. No scalp  soft tissue abnormality. Sinuses/Orbits: Globes and orbital soft tissues within normal limits. Scattered mucosal thickening within the ethmoidal air cells and maxillary sinuses. Paranasal sinuses are otherwise clear. No significant mastoid effusion. Other: None. MRA HEAD FINDINGS ANTERIOR CIRCULATION: Examination moderately degraded by motion artifact. Distal cervical segments of the internal carotid arteries are patent with antegrade flow. Petrous, cavernous, and supraclinoid segments patent without hemodynamically significant stenosis A1 segments patent bilaterally. Right A1 hypoplastic. Anterior communicating artery grossly normal. Anterior cerebral arteries patent to their distal aspects without obvious stenosis on this motion degraded exam. M1 segments patent bilaterally. No obvious proximal M2 occlusion. Distal MCA branches well perfused and fairly symmetric. POSTERIOR CIRCULATION: Left vertebral artery dominant and diffusely tortuous, crossing the midline prior to the vertebrobasilar junction. Right vertebral artery hypoplastic but patent as well. Left PICA patent. Right PICA not visualized. Basilar tortuous but grossly patent without stenosis, although evaluation limited by motion. Superior cerebral arteries not well assessed on this exam due to motion. Both of the posterior cerebral arteries appear to be primarily supplied via the basilar. PCAs patent proximally, not well assessed distally due to motion. No obvious intracranial aneurysm on this motion degraded study. IMPRESSION: MRI HEAD IMPRESSION: 1. Limited exam due to patient's inability to tolerate the full length of the study as well as extensive motion artifact. 2. 15 mm acute ischemic lacunar type infarct involving the posterior right basal ganglia. 3. Additional 9 mm focus of diffusion abnormality involving the right periventricular white matter, most consistent with sequelae of subacute small vessel ischemic changes. 4. Atrophy with moderate  chronic small vessel ischemic disease. MRA HEAD IMPRESSION: 1. Technically limited exam due to extensive motion artifact. 2. Negative intracranial MRA for large vessel occlusion. No obvious hemodynamically significant or correctable stenosis identified. Electronically Signed   By: Jeannine Boga M.D.   On: 10/22/2017 19:58    EKG: Independently reviewed.  Vent. rate 68 BPM PR interval * ms QRS duration 151 ms QT/QTc 439/467 ms P-R-T axes 63 -80 88 Sinus rhythm RBBB and LAFB Probable left ventricular hypertrophy Abnormal ekg  Assessment/Plan Principal Problem:   Acute CVA (cerebrovascular accident) ([place]) Admit to telemetry/inpatient. Frequent neuro checks. N.p.o. at least, until swallow test evaluation. PT/OT/SLP. Check fasting lipids. Check hemoglobin A1c. Check carotid Dopplers. Consult cardiology for TEE as suggested by neurology on call (Dr. Cheral Marker). Consult neurology in a.m. Smoking cessation encouraged.  Active Problems:   Trigeminal neuralgia Continue carbamazepine 400 mg p.o. 4 times daily. Continue gabapentin 600 mg p.o. 3 times daily.    HTN (hypertension) Allow permissive hypertension up to 220/120 mmHg. Hold antihypertensives for now. Monitor blood pressure.    COPD (chronic obstructive pulmonary disease) ([place]) May have fixed wheezing. Supplemental oxygen as needed. Beta agonist as needed. Smoking cessation suggested.    Hyperlipidemia Currently not on medical therapy. Check fasting lipids in a.m.    Tobacco use Smoking cessation encouraged. Nicotine replacement therapy ordered as needed. Staff to provide tobacco cessation information.    Hyperglycemia Carbohydrate modified diet. CBG monitoring with regular insulin sliding scale. Check hemoglobin A1c.    DVT prophylaxis: SQ Lovenox. Code Status: Full code. Family  Communication:  Disposition Plan: CVA work-up, TEE suggested by neuro and to be performed by cardiology. Consults called:  Routine cardiology consult. Admission status: Inpatient/telemetry.   Reubin Milan MD [facility] Pager [phone].  If 7PM-7AM, please contact night-coverage [email or web address] Password [email or web address]  10/22/2017, 9:17 PM

## 2017-10-23 ENCOUNTER — Inpatient Hospital Stay (HOSPITAL_COMMUNITY): Payer: Medicare Other

## 2017-10-23 DIAGNOSIS — J449 Chronic obstructive pulmonary disease, unspecified: Secondary | ICD-10-CM

## 2017-10-23 DIAGNOSIS — Z720 Tobacco use: Secondary | ICD-10-CM

## 2017-10-23 DIAGNOSIS — I10 Essential (primary) hypertension: Secondary | ICD-10-CM

## 2017-10-23 DIAGNOSIS — E782 Mixed hyperlipidemia: Secondary | ICD-10-CM

## 2017-10-23 DIAGNOSIS — G500 Trigeminal neuralgia: Secondary | ICD-10-CM

## 2017-10-23 DIAGNOSIS — J441 Chronic obstructive pulmonary disease with (acute) exacerbation: Secondary | ICD-10-CM

## 2017-10-23 DIAGNOSIS — I639 Cerebral infarction, unspecified: Secondary | ICD-10-CM

## 2017-10-23 LAB — GLUCOSE, CAPILLARY
GLUCOSE-CAPILLARY: 125 mg/dL — AB (ref 70–99)
GLUCOSE-CAPILLARY: 115 mg/dL — AB (ref 70–99)
GLUCOSE-CAPILLARY: 102 mg/dL — AB (ref 70–99)
Glucose-Capillary: 90 mg/dL (ref 70–99)

## 2017-10-23 LAB — BASIC METABOLIC PANEL
ANION GAP: 8 (ref 5–15)
BUN: 11 mg/dL (ref 6–20)
CALCIUM: 8.5 mg/dL — AB (ref 8.9–10.3)
CO2: 27 mmol/L (ref 22–32)
Chloride: 104 mmol/L (ref 98–111)
Creatinine, Ser: 0.6 mg/dL — ABNORMAL LOW (ref 0.61–1.24)
Glucose, Bld: 111 mg/dL — ABNORMAL HIGH (ref 70–99)
POTASSIUM: 3.9 mmol/L (ref 3.5–5.1)
SODIUM: 139 mmol/L (ref 135–145)

## 2017-10-23 LAB — LIPID PANEL
Cholesterol: 195 mg/dL (ref 0–200)
HDL: 54 mg/dL (ref >40)
LDL CALC: 120 mg/dL — AB (ref 0–99)
TRIGLYCERIDES: 103 mg/dL (ref <150)
Total CHOL/HDL Ratio: 3.6 RATIO
VLDL: 21 mg/dL (ref 0–40)

## 2017-10-23 LAB — ECHOCARDIOGRAM LIMITED
Height: 69 in
WEIGHTICAEL: 3825.6 [oz_av]

## 2017-10-23 LAB — HEMOGLOBIN A1C
HEMOGLOBIN A1C: 5.6 % (ref 4.8–5.6)
Mean Plasma Glucose: 114.02 mg/dL

## 2017-10-23 MED ORDER — UMECLIDINIUM BROMIDE 62.5 MCG/INH IN AEPB
1.0000 | INHALATION_SPRAY | Freq: Every day | RESPIRATORY_TRACT | Status: DC
  Administered 2017-10-23: 1 via RESPIRATORY_TRACT
  Filled 2017-10-23: qty 7

## 2017-10-23 MED ORDER — FLUTICASONE FUROATE-VILANTEROL 100-25 MCG/INH IN AEPB
1.0000 | INHALATION_SPRAY | Freq: Every day | RESPIRATORY_TRACT | Status: DC
  Administered 2017-10-23: 1 via RESPIRATORY_TRACT
  Filled 2017-10-23: qty 28

## 2017-10-23 MED ORDER — LISINOPRIL 10 MG PO TABS
20.0000 mg | ORAL_TABLET | Freq: Every day | ORAL | Status: DC
  Administered 2017-10-23 – 2017-10-24 (×2): 20 mg via ORAL
  Filled 2017-10-23 (×2): qty 2

## 2017-10-23 MED ORDER — ATORVASTATIN CALCIUM 40 MG PO TABS
40.0000 mg | ORAL_TABLET | Freq: Every day | ORAL | Status: DC
  Administered 2017-10-23: 40 mg via ORAL
  Filled 2017-10-23: qty 1

## 2017-10-23 MED ORDER — HYDROXYZINE HCL 25 MG PO TABS
25.0000 mg | ORAL_TABLET | Freq: Three times a day (TID) | ORAL | Status: DC | PRN
  Administered 2017-10-23: 25 mg via ORAL
  Filled 2017-10-23: qty 1

## 2017-10-23 NOTE — Consult Note (Signed)
[place] A. Merlene Laughter, MD     [email or web address]          Matthew [name] is an 57 y.o. male.   ASSESSMENT/PLAN: 1.  Acute lacunar infarct presenting with gait instability, left hemiparesis and left-sided numbness: Aspirin 325 is recommended along with blood pressure control.  Low-dose statin is also recommended.  2.  Suspected obstructive sleep apnea syndrome: Recommend outpatient diagnostic sleep testing.  Patient is a 57 year old white male who presents with acute onset of gait instability, leg weakness on both sides and left upper extremity weakness along with left-sided numbness.  The patient reports that the symptoms happened last night after dinner.  Patient tells me that he was barely able to walk yesterday.  He reports improvement in symptoms today.  His speech is slightly dysarthric but he tells me this is his baseline.  He does not report chest pain or shortness of breath.  He apparently has some chronic pain issues at baseline.  The review of systems otherwise negative.   GENERAL: This a pleasant overweight male who is in no acute distress.  HEENT: Posterior airway is crowded and he does have a large neck.  ABDOMEN: soft  EXTREMITIES: No edema   BACK: Normal  SKIN: Normal by inspection.    MENTAL STATUS: Alert and oriented. Speech -mildly dysarthric although the patient tells me this is his baseline, language and cognition are generally intact. Judgment and insight normal.   CRANIAL NERVES: Pupils R -6 mm and L 5 mm, both round and reactive to light and accomodation; extra ocular movements are full, there is no significant nystagmus; visual fields are full; there is mild ptosis on the left otherwise upper and lower facial muscles are normal in strength and symmetric, there is no flattening of the nasolabial folds; tongue is midline; uvula is midline; shoulder elevation is normal.  MOTOR: Normal tone, bulk and strength; no pronator drift.  There is mild  drift of the legs bilaterally.  COORDINATION: Left finger to nose is mildly dysmetric, right finger to nose is normal, No rest tremor; no intention tremor; no postural tremor; no bradykinesia.  Heel-to-shin is normal bilaterally.  REFLEXES: Deep tendon reflexes are symmetrical and normal. Plantar reflexes are flexor bilaterally.   SENSATION: Normal to light touch, temperature, and pain.  GAIT: Normal.   NIH stroke scale 1, 1, 1, 1 total 4.  Blood pressure (!) 156/87, pulse (!) 50, temperature 98.3 F (36.8 C), temperature source Oral, resp. rate 20, height _0  (1.753 m), weight 108.5 kg, SpO2 96 %.  Past Medical History:  Diagnosis Date  . Abscess   . Anxiety   . Aphthous ulcer   . Arthritis   . Asthma   . Chronic bronchitis   . Chronic pain   . COPD (chronic obstructive pulmonary disease) ([place])   . Depression   . Hyperlipidemia   . Hypertension   . Trigeminal neuralgia   . Vertigo     Past Surgical History:  Procedure Laterality Date  . CHOLECYSTECTOMY    . HAND RECONSTRUCTION Right 1990's    Family History  Problem Relation Age of Onset  . Hypertension Mother   . Diabetes Mother   . Hypertension Father   . Diabetes Father   . Cancer Father     Social History:  reports that he has been smoking cigarettes. He started smoking about 42 years ago. He has a 80.00 pack-year smoking history. He has never used smokeless tobacco. He reports that he  drinks alcohol. He reports that he does not use drugs.  Allergies:  Allergies  Allergen Reactions  . Penicillins     Medications: Prior to Admission medications   Medication Sig Start Date End Date Taking? Authorizing Provider  albuterol (VENTOLIN HFA) 108 (90 Base) MCG/ACT inhaler INHALE 2 PUFFS INTO LUNGS EVERY 6 HOURS AS NEEDED FOR WHEEZE OR SHORTNESS OF BREATH Patient taking differently: Inhale 2 puffs into the lungs every 6 (six) hours as needed for shortness of breath.  09/24/17  Yes Hawks, Christy A, FNP    amLODipine (NORVASC) 10 MG tablet TAKE 1 TABLET BY MOUTH EVERY DAY Patient taking differently: Take 10 mg by mouth daily.  05/11/17  Yes Hawks, Christy A, FNP  carbamazepine (TEGRETOL) 200 MG tablet TAKE 2 TABLETS BY MOUTH 4 TIMES A DAY Patient taking differently: Take 400 mg by mouth 4 (four) times daily.  04/02/17  Yes Hawks, Christy A, FNP  fluticasone (FLONASE) 50 MCG/ACT nasal spray USE 2 SPRAYS INTO BOTH NOSTRILS ONCE DAILY Patient taking differently: Place 2 sprays into both nostrils daily.  10/18/17  Yes Hawks, Christy A, FNP  Fluticasone-Umeclidin-Vilant (TRELEGY ELLIPTA) 100-62.5-25 MCG/INH AEPB Inhale 1 puff into the lungs daily. 07/12/17  Yes Hawks, Christy A, FNP  gabapentin (NEURONTIN) 600 MG tablet Take 1 tablet in the morning, 1 tablet in the afternoon, and 1.5 tablet at bedtime Patient taking differently: Take 600 mg by mouth 3 (three) times daily.  07/16/17  Yes Patel, Donika K, DO  hydrochlorothiazide (MICROZIDE) 12.5 MG capsule TAKE 1 CAPSULE BY MOUTH EVERY DAY Patient taking differently: Take 12.5 mg by mouth daily.  05/11/17  Yes Hawks, Christy A, FNP  lisinopril-hydrochlorothiazide (ZESTORETIC) 20-12.5 MG tablet Take 1 tablet by mouth daily. 10/11/17  Yes Hawks, Christy A, FNP  Vitamin D, Ergocalciferol, (DRISDOL) 50000 units CAPS capsule Take 1 capsule (50,000 Units total) by mouth every 7 (seven) days. Patient not taking: Reported on 10/22/2017 10/16/17   Evelina Dun A, FNP    Scheduled Meds: . aspirin  300 mg Rectal Daily   Or  . aspirin  325 mg Oral Daily  . atorvastatin  40 mg Oral q1800  . carbamazepine  400 mg Oral QID  . fluticasone  2 spray Each Nare Daily  . fluticasone furoate-vilanterol  1 puff Inhalation Daily   And  . umeclidinium bromide  1 puff Inhalation Daily  . gabapentin  600 mg Oral TID  . insulin aspart  0-9 Units Subcutaneous TID WC  . lisinopril  20 mg Oral Daily   Continuous Infusions: PRN Meds:.acetaminophen **OR** acetaminophen (TYLENOL) oral  liquid 160 mg/5 mL **OR** acetaminophen, albuterol, hydrOXYzine, nicotine, ondansetron **OR** ondansetron (ZOFRAN) IV     Results for orders placed or performed during the hospital encounter of 10/22/17 (from the past 48 hour(s))  Urine rapid drug screen (hosp performed)not at [facility]     Status: None   Collection Time: 10/22/17  2:41 PM  Result Value Ref Range   Opiates NONE DETECTED NONE DETECTED   Cocaine NONE DETECTED NONE DETECTED   Benzodiazepines NONE DETECTED NONE DETECTED   Amphetamines NONE DETECTED NONE DETECTED   Tetrahydrocannabinol NONE DETECTED NONE DETECTED   Barbiturates NONE DETECTED NONE DETECTED    Comment: (NOTE) DRUG SCREEN FOR MEDICAL PURPOSES ONLY.  IF CONFIRMATION IS NEEDED FOR ANY PURPOSE, NOTIFY LAB WITHIN 5 DAYS. LOWEST DETECTABLE LIMITS FOR URINE DRUG SCREEN Drug Class  Cutoff (ng/mL) Amphetamine and metabolites    1000 Barbiturate and metabolites    200 Benzodiazepine                 947 Tricyclics and metabolites     300 Opiates and metabolites        300 Cocaine and metabolites        300 THC                            50 Performed at [facility], [address], [place], [place] [address]   Urinalysis, Routine w reflex microscopic (not at [facility])     Status: Abnormal   Collection Time: 10/22/17  2:41 PM  Result Value Ref Range   Color, Urine STRAW (A) YELLOW   APPearance CLEAR CLEAR   Specific Gravity, Urine 1.005 1.005 - 1.030   pH 6.0 5.0 - 8.0   Glucose, UA NEGATIVE NEGATIVE mg/dL   Hgb urine dipstick NEGATIVE NEGATIVE   Bilirubin Urine NEGATIVE NEGATIVE   Ketones, ur NEGATIVE NEGATIVE mg/dL   Protein, ur NEGATIVE NEGATIVE mg/dL   Nitrite NEGATIVE NEGATIVE   Leukocytes, UA NEGATIVE NEGATIVE    Comment: Performed at [facility], [address] [place], [place] [address]  Protime-INR     Status: None   Collection Time: 10/22/17  3:04 PM  Result Value Ref Range   Prothrombin Time 11.7 11.4 - 15.2 seconds   INR 0.87      Comment: Performed at [facility], [address], [place], [place] [address]  APTT     Status: None   Collection Time: 10/22/17  3:04 PM  Result Value Ref Range   aPTT 26 24 - 36 seconds    Comment: Performed at [facility], [address] [place], [place] [address]  CBC     Status: None   Collection Time: 10/22/17  3:04 PM  Result Value Ref Range   WBC 7.6 4.0 - 10.5 K/uL   RBC 4.71 4.22 - 5.81 MIL/uL   Hemoglobin 14.6 13.0 - 17.0 g/dL   HCT 43.0 39.0 - 52.0 %   MCV 91.3 78.0 - 100.0 fL   MCH 31.0 26.0 - 34.0 pg   MCHC 34.0 30.0 - 36.0 g/dL   RDW 13.0 11.5 - 15.5 %   Platelets 205 150 - 400 K/uL    Comment: Performed at [facility], [address], [place], [place] [address]  Differential     Status: None   Collection Time: 10/22/17  3:04 PM  Result Value Ref Range   Neutrophils Relative % 74 %   Neutro Abs 5.6 1.7 - 7.7 K/uL   Lymphocytes Relative 19 %   Lymphs Abs 1.4 0.7 - 4.0 K/uL   Monocytes Relative 6 %   Monocytes Absolute 0.5 0.1 - 1.0 K/uL   Eosinophils Relative 1 %   Eosinophils Absolute 0.1 0.0 - 0.7 K/uL   Basophils Relative 0 %   Basophils Absolute 0.0 0.0 - 0.1 K/uL    Comment: Performed at [facility], [address] [place], [place] [address]  Comprehensive metabolic panel     Status: Abnormal   Collection Time: 10/22/17  3:04 PM  Result Value Ref Range   Sodium 136 135 - 145 mmol/L   Potassium 3.6 3.5 - 5.1 mmol/L   Chloride 100 98 - 111 mmol/L   CO2 27 22 - 32 mmol/L   Glucose, Bld 143 (H) 70 - 99  mg/dL   BUN 10 6 - 20 mg/dL   Creatinine, Ser 0.60 (L) 0.61 - 1.24 mg/dL   Calcium 8.7 (L) 8.9 - 10.3 mg/dL   Total Protein 7.5 6.5 - 8.1 g/dL   Albumin 4.0 3.5 - 5.0 g/dL   AST 23 15 - 41 U/L   ALT 25 0 - 44 U/L   Alkaline Phosphatase 108 38 - 126 U/L   Total Bilirubin 0.4 0.3 - 1.2 mg/dL   GFR calc non Af Amer >60 >60 mL/min   GFR calc Af Amer >60 >60 mL/min    Comment: (NOTE) The eGFR has been calculated using the CKD EPI equation. This  calculation has not been validated in all clinical situations. eGFR's persistently <60 mL/min signify possible Chronic Kidney Disease.    Anion gap 9 5 - 15    Comment: Performed at [facility], [address], [place], [place] [address]  D-dimer, quantitative (not at [facility])     Status: None   Collection Time: 10/22/17  3:04 PM  Result Value Ref Range   D-Dimer, Quant 0.30 0.00 - 0.50 ug/mL-FEU    Comment: (NOTE) At the manufacturer cut-off of 0.50 ug/mL FEU, this assay has been documented to exclude PE with a sensitivity and negative predictive value of 97 to 99%.  At this time, this assay has not been approved by the FDA to exclude DVT/VTE. Results should be correlated with clinical presentation. Performed at [facility], [address] [place], [place] [address]   Magnesium     Status: None   Collection Time: 10/22/17  3:04 PM  Result Value Ref Range   Magnesium 2.0 1.7 - 2.4 mg/dL    Comment: Performed at [facility], [address] [place], [place] [address]  Phosphorus     Status: None   Collection Time: 10/22/17  3:04 PM  Result Value Ref Range   Phosphorus 3.5 2.5 - 4.6 mg/dL    Comment: Performed at [facility], [address], [place], [place] [address]  I-stat troponin, ED     Status: None   Collection Time: 10/22/17  3:13 PM  Result Value Ref Range   Troponin i, poc 0.02 0.00 - 0.08 ng/mL   Comment 3            Comment: Due to the release kinetics of cTnI, a negative result within the first hours of the onset of symptoms does not rule out myocardial infarction with certainty. If myocardial infarction is still suspected, repeat the test at appropriate intervals.   Glucose, capillary     Status: Abnormal   Collection Time: 10/22/17 10:49 PM  Result Value Ref Range   Glucose-Capillary 154 (H) 70 - 99 mg/dL   Comment 1 Notify RN    Comment 2 Document in Chart   Hemoglobin A1c     Status: None   Collection Time: 10/23/17  5:13 AM  Result Value Ref Range   Hgb  A1c MFr Bld 5.6 4.8 - 5.6 %    Comment: (NOTE) Pre diabetes:          5.7%-6.4% Diabetes:              >6.4% Glycemic control for   <7.0% adults with diabetes    Mean Plasma Glucose 114.02 mg/dL    Comment: Performed at [facility] [address] [place], [place] [address]  Lipid panel     Status: Abnormal   Collection Time: 10/23/17  5:13 AM  Result Value  Ref Range   Cholesterol 195 0 - 200 mg/dL   Triglycerides 103 <150 mg/dL   HDL 54 >40 mg/dL   Total CHOL/HDL Ratio 3.6 RATIO   VLDL 21 0 - 40 mg/dL   LDL Cholesterol 120 (H) 0 - 99 mg/dL    Comment:        Total Cholesterol/HDL:CHD Risk Coronary Heart Disease Risk Table                     Men   Women  1/2 Average Risk   3.4   3.3  Average Risk       5.0   4.4  2 X Average Risk   9.6   7.1  3 X Average Risk  23.4   11.0        Use the calculated Patient Ratio above and the CHD Risk Table to determine the patient's CHD Risk.        ATP III CLASSIFICATION (LDL):  <100     mg/dL   Optimal  100-129  mg/dL   Near or Above                    Optimal  130-159  mg/dL   Borderline  160-189  mg/dL   High  >190     mg/dL   Very High Performed at [facility] [place], [place] [address]   Basic metabolic panel     Status: Abnormal   Collection Time: 10/23/17  5:13 AM  Result Value Ref Range   Sodium 139 135 - 145 mmol/L   Potassium 3.9 3.5 - 5.1 mmol/L   Chloride 104 98 - 111 mmol/L   CO2 27 22 - 32 mmol/L   Glucose, Bld 111 (H) 70 - 99 mg/dL   BUN 11 6 - 20 mg/dL   Creatinine, Ser 0.60 (L) 0.61 - 1.24 mg/dL   Calcium 8.5 (L) 8.9 - 10.3 mg/dL   GFR calc non Af Amer >60 >60 mL/min   GFR calc Af Amer >60 >60 mL/min    Comment: (NOTE) The eGFR has been calculated using the CKD EPI equation. This calculation has not been validated in all clinical situations. eGFR's persistently <60 mL/min signify possible Chronic Kidney Disease.    Anion gap 8 5 - 15    Comment: Performed at [facility],  [address] [place], [place] [address]  Glucose, capillary     Status: Abnormal   Collection Time: 10/23/17  8:12 AM  Result Value Ref Range   Glucose-Capillary 125 (H) 70 - 99 mg/dL   Comment 1 Notify RN    Comment 2 Document in Chart   Glucose, capillary     Status: Abnormal   Collection Time: 10/23/17 11:19 AM  Result Value Ref Range   Glucose-Capillary 115 (H) 70 - 99 mg/dL   Comment 1 Notify RN    Comment 2 Document in Chart   Glucose, capillary     Status: Abnormal   Collection Time: 10/23/17  4:49 PM  Result Value Ref Range   Glucose-Capillary 102 (H) 70 - 99 mg/dL   Comment 1 Notify RN    Comment 2 Document in Chart     Studies/Results:   BRAIN MRI MRA FINDINGS: MRI HEAD FINDINGS  Brain: Examination limited by motion artifact. Additionally, patient was unable to tolerate the full length of the exam.  Diffuse prominence of the CSF containing spaces compatible with generalized age-related cerebral atrophy. Patchy and  confluent T2/FLAIR hyperintensity within the periventricular deep white matter both cerebral hemispheres most consistent with chronic small vessel ischemic disease, moderate in nature. Remote lacunar infarct present within the left thalamus.  Approximate 15 mm focus of curvilinear restricted diffusion extending from the posterior right lentiform nucleus/internal capsule towards the lateral right thalamus, consistent with a small acute ischemic lacunar type infarct (series 3, images 84, 86). No obvious associated hemorrhage on this limited exam.  Additional 9 mm focus of vague diffusion abnormality at the right periventricular white matter superiorly (series 3, image 95), likely subacute small vessel ischemic changes. No other evidence for acute or subacute ischemia. Gray-white matter differentiation otherwise maintained. No areas of remote cortical infarction.  No mass lesion, midline shift or mass effect. No hydrocephalus. No extra-axial fluid  collection. Pituitary gland normal.  Vascular: Hypoplastic right vertebral artery not well visualized. Remainder of the major intracranial vascular flow voids maintained at the skull base.  Skull and upper cervical spine: Craniocervical junction normal. No focal marrow replacing lesion. No scalp soft tissue abnormality.  Sinuses/Orbits: Globes and orbital soft tissues within normal limits. Scattered mucosal thickening within the ethmoidal air cells and maxillary sinuses. Paranasal sinuses are otherwise clear. No significant mastoid effusion.  Other: None.  MRA HEAD FINDINGS  ANTERIOR CIRCULATION:  Examination moderately degraded by motion artifact.  Distal cervical segments of the internal carotid arteries are patent with antegrade flow. Petrous, cavernous, and supraclinoid segments patent without hemodynamically significant stenosis A1 segments patent bilaterally. Right A1 hypoplastic. Anterior communicating artery grossly normal. Anterior cerebral arteries patent to their distal aspects without obvious stenosis on this motion degraded exam.  M1 segments patent bilaterally. No obvious proximal M2 occlusion. Distal MCA branches well perfused and fairly symmetric.  POSTERIOR CIRCULATION:  Left vertebral artery dominant and diffusely tortuous, crossing the midline prior to the vertebrobasilar junction. Right vertebral artery hypoplastic but patent as well. Left PICA patent. Right PICA not visualized. Basilar tortuous but grossly patent without stenosis, although evaluation limited by motion. Superior cerebral arteries not well assessed on this exam due to motion. Both of the posterior cerebral arteries appear to be primarily supplied via the basilar. PCAs patent proximally, not well assessed distally due to motion.  No obvious intracranial aneurysm on this motion degraded study.  IMPRESSION: MRI HEAD IMPRESSION:  1. Limited exam due to patient's inability  to tolerate the full length of the study as well as extensive motion artifact. 2. 15 mm acute ischemic lacunar type infarct involving the posterior right basal ganglia. 3. Additional 9 mm focus of diffusion abnormality involving the right periventricular white matter, most consistent with sequelae of subacute small vessel ischemic changes. 4. Atrophy with moderate chronic small vessel ischemic disease.  MRA HEAD IMPRESSION:  1. Technically limited exam due to extensive motion artifact. 2. Negative intracranial MRA for large vessel occlusion. No obvious hemodynamically significant or correctable stenosis identified.    CAROTID DOPPLER normal      TTE - Limited echo in setting of stroke. - Left ventricle: The cavity size was normal. Wall thickness was   increased in a pattern of moderate to severe LVH. Systolic   function was normal. The estimated ejection fraction was in the   range of 55% to 60%. Wall motion was normal; there were no   regional wall motion abnormalities. - Aortic valve: Valve area (VTI): 2.11 cm^2. Valve area (Vmax):   2.08 cm^2     THE BRAIN MRI AND MRA REVIEWED IN PERSON.  THERE IS A SMALL  RIGHT PUTAMINAL INCREASED SIGNAL SEEN DWI  WITH CORRESPONDING REDUCTION IN THE ADC. INDICATING ACUTE ISCHEMIC STROKE.  THERE IS MODERATE DEEP WHITE MATTER AND PERIVENTRICULAR LEUKOENCEPHALOPATHY.  MRA SHOWED HYPOPLASTIC RIGHT VERTEBRAL BUT THE STUDY IS QUITE LIMITED DUE TO MOTION ARTIFACT.   NO CLEAR LARGE VESSEL OCCLUSIVE DISEASE IS NOTED.   Tawanna Funk A. Merlene Laughter, M.D.  Diplomate, Tax adviser of Psychiatry and Neurology ( Neurology). 10/23/2017, 5:37 PM

## 2017-10-23 NOTE — Progress Notes (Signed)
PT remained NPO from 2929 until 0520 for fasting lipid panel. Labs have been drawn and are processing. Diet changed per order. Continue to monitor.

## 2017-10-23 NOTE — Progress Notes (Signed)
    By review of the chart, the patient is currently admitted for an Acute CVA. Neurology consult is pending. Cardiology was consulted for consideration of a TEE. Will await official Neurology consult with recommendations and Transthoracic Echocardiogram report prior to arranging the procedure. If deemed necessary, would keep NPO after midnight for likely procedure on 10/24/2017.  Signed, Erma Heritage, PA-C 10/23/2017, 8:49 AM Pager: [phone]

## 2017-10-23 NOTE — Progress Notes (Signed)
*  PRELIMINARY RESULTS* Echocardiogram 2D Echocardiogram LIMITED has been performed.  Leavy Cella 10/23/2017, 12:16 PM

## 2017-10-23 NOTE — Evaluation (Signed)
Occupational Therapy Evaluation Patient Details Name: Matthew [name] MRN: [id number] DOB: 10-18-1960 Today's Date: 10/23/2017    History of Present Illness [name] is a 57 y.o. male with medical history significant of abscess, anxiety, depression, aphthous ulcer, arthritis, COPD, trigeminal neuralgia, chronic pain syndrome, hyperlipidemia, hypertension, vertigo who is coming to the emergency department due to LUE and LLE numbness earlier this evening shortly after he had a hot dinner.  He denies headache, blurred vision, slurred speech, nausea or emesis, but has some difficulty ambulating due to LLE weakness.  Since then, the numbness has disappeared.  He denies fever, chills, sore throat, dyspnea or hemoptysis.  However, he has frequent wheezing.  No chest pain, diaphoresis, dizziness, PND, orthopnea or pitting edema lower extremities.  He complains of occasional palpitations.  He denies abdominal pain, diarrhea, constipation, melena or hematochezia.  He denies dysuria, frequency or hematuria.  No heat or cold intolerance.  Denies polyuria, polydipsia, polyphagia.  Denies skin rashes or pruritus.   Clinical Impression   Pt appears to be at baseline functioning and independent in ADL completion. Pt demonstrates BUE strength WNL, sensation and coordination intact. Pt performing functional mobility independently while managing IV pole without difficulty. No further OT services required at this time.     Follow Up Recommendations  No OT follow up    Equipment Recommendations  None recommended by OT       Precautions / Restrictions Precautions Precautions: None Restrictions Weight Bearing Restrictions: No      Mobility Bed Mobility Overal bed mobility: Independent                Transfers Overall transfer level: Independent Equipment used: None             General transfer comment: Pt was able to manage IV pole independently                                                ADL either performed or assessed with clinical judgement   ADL Overall ADL's : Independent                                             Vision Baseline Vision/History: No visual deficits Patient Visual Report: No change from baseline Vision Assessment?: No apparent visual deficits            Pertinent Vitals/Pain Pain Assessment: No/denies pain     Hand Dominance Right   Extremity/Trunk Assessment Upper Extremity Assessment Upper Extremity Assessment: Overall WFL for tasks assessed(grossly 5/5 in BUE)   Lower Extremity Assessment Lower Extremity Assessment: Defer to PT evaluation   Cervical / Trunk Assessment Cervical / Trunk Assessment: Normal   Communication Communication Communication: No difficulties   Cognition Arousal/Alertness: Awake/alert Behavior During Therapy: WFL for tasks assessed/performed Overall Cognitive Status: Within Functional Limits for tasks assessed                                                      Home Living Family/patient expects to be discharged to:: Private residence Living Arrangements: Alone Available Help at  Discharge: Family;Friend(s);Available PRN/intermittently Type of Home: House Home Access: Level entry     Home Layout: One level     Bathroom Shower/Tub: Teacher, early years/pre: Standard     Home Equipment: None   Additional Comments: Pt drives moped scooter in community       Prior Functioning/Environment Level of Independence: Independent                     End of Session    Activity Tolerance: Patient tolerated treatment well Patient left: in bed;with call bell/phone within reach;with family/visitor present  OT Visit Diagnosis: Muscle weakness (generalized) (M62.81)                Time: [id number] OT Time Calculation (min): 12 min Charges:  OT General Charges $OT Visit: 1 Visit OT Evaluation $OT Eval Low Complexity: [place], OTR/L  [phone] 10/23/2017, 8:22 AM

## 2017-10-23 NOTE — Progress Notes (Signed)
PROGRESS NOTE    Matthew [name]  DQQ:[id number] DOB: 03/25/1960 DOA: 10/22/2017 PCP: [name], FNP    Brief Narrative:  57 year old male who presents with left-sided weakness and numbness in his upper and lower extremities.  Symptoms have since resolved since admission.  MRI confirms acute infarct.  He was admitted for further stroke work-up.   Assessment & Plan:   Principal Problem:   Acute CVA (cerebrovascular accident) ([place]) Active Problems:   Trigeminal neuralgia   HTN (hypertension)   COPD (chronic obstructive pulmonary disease) (HCC)   Hyperlipidemia   Tobacco use   Hyperglycemia   1. Acute CVA.  MRI brain confirms 15 mm acute ischemic lacunar type infarct involving the posterior right basal ganglia.  Echocardiogram does not show any significant findings.  Carotid Dopplers did not show any significant stenosis bilaterally.  LDL is elevated on lipid panel and he is been started on statin.  Blood pressure is elevated, he is been restarted on his home dose of lisinopril.  A1c is less than 6.  He is not chronically on any antiplatelet therapy and has been started on a daily aspirin.  There is a question if he needs a TEE performed.  Neurology has been consulted for further recommendations. 2. Trigeminal neuralgia.  Continued on carbamazepine and gabapentin.  Appears stable. 3. Hypertension.  Continue on home dose of lisinopril. 4. COPD.  No wheezing at this time.  Respiratory status appears stable. 5. Hyperlipidemia.  Start on statin. 6. Tobacco use.  Counseled on the importance of tobacco cessation and its effects on his current presentation.  Approximately 5 minutes was spent on counseling.   DVT prophylaxis: early ambulation Code Status: full code Family Communication: discussed with son at the bedside Disposition Plan: discharge home once work up complete   Consultants:   Neurology  Procedures:  Echo: - Left ventricle: The cavity size was normal. Wall thickness  was   increased in a pattern of moderate to severe LVH. Systolic   function was normal. The estimated ejection fraction was in the   range of 55% to 60%. Wall motion was normal; there were no   regional wall motion abnormalities. - Aortic valve: Valve area (VTI): 2.11 cm^2. Valve area (Vmax):    2.08 cm^2.  Antimicrobials:       Subjective: Feeling better.  Feels that numbness and weakness on left side is better.  Objective: Vitals:   10/23/17 1047 10/23/17 1447 10/23/17 1652 10/23/17 1654  BP: (!) 169/87 (!) 156/87    Pulse: (!) 48 (!) 50    Resp: 18 20    Temp: 98.4 F (36.9 C) 98.3 F (36.8 C)    TempSrc: Oral Oral    SpO2: 97% 98% 96% 96%  Weight:      Height:        Intake/Output Summary (Last 24 hours) at 10/23/2017 1726 Last data filed at 10/23/2017 1712 Gross per 24 hour  Intake 1810 ml  Output 1200 ml  Net 610 ml   Filed Weights   10/22/17 1403 10/22/17 2247  Weight: 108 kg 108.5 kg    Examination:  General exam: Appears calm and comfortable  Respiratory system: Clear to auscultation. Respiratory effort normal. Cardiovascular system: S1 & S2 heard, RRR. No JVD, murmurs, rubs, gallops or clicks. No pedal edema. Gastrointestinal system: Abdomen is nondistended, soft and nontender. No organomegaly or masses felt. Normal bowel sounds heard. Central nervous system: Alert and oriented. No focal neurological deficits. Extremities: Symmetric 5 x 5  power. Skin: No rashes, lesions or ulcers Psychiatry: Judgement and insight appear normal. Mood & affect appropriate.     Data Reviewed: I have personally reviewed following labs and imaging studies  CBC: Recent Labs  Lab 10/22/17 1504  WBC 7.6  NEUTROABS 5.6  HGB 14.6  HCT 43.0  MCV 91.3  PLT 119   Basic Metabolic Panel: Recent Labs  Lab 10/22/17 1504 10/23/17 0513  NA 136 139  K 3.6 3.9  CL 100 104  CO2 27 27  GLUCOSE 143* 111*  BUN 10 11  CREATININE 0.60* 0.60*  CALCIUM 8.7* 8.5*  MG 2.0   --   PHOS 3.5  --    GFR: Estimated Creatinine Clearance: 122.1 mL/min (A) (by C-G formula based on SCr of 0.6 mg/dL (L)). Liver Function Tests: Recent Labs  Lab 10/22/17 1504  AST 23  ALT 25  ALKPHOS 108  BILITOT 0.4  PROT 7.5  ALBUMIN 4.0   No results for input(s): LIPASE, AMYLASE in the last 168 hours. No results for input(s): AMMONIA in the last 168 hours. Coagulation Profile: Recent Labs  Lab 10/22/17 1504  INR 0.87   Cardiac Enzymes: No results for input(s): CKTOTAL, CKMB, CKMBINDEX, TROPONINI in the last 168 hours. BNP (last 3 results) No results for input(s): PROBNP in the last 8760 hours. HbA1C: Recent Labs    10/23/17 0513  HGBA1C 5.6   CBG: Recent Labs  Lab 10/22/17 2249 10/23/17 0812 10/23/17 1119 10/23/17 1649  GLUCAP 154* 125* 115* 102*   Lipid Profile: Recent Labs    10/23/17 0513  CHOL 195  HDL 54  LDLCALC 120*  TRIG 103  CHOLHDL 3.6   Thyroid Function Tests: No results for input(s): TSH, T4TOTAL, FREET4, T3FREE, THYROIDAB in the last 72 hours. Anemia Panel: No results for input(s): VITAMINB12, FOLATE, FERRITIN, TIBC, IRON, RETICCTPCT in the last 72 hours. Sepsis Labs: No results for input(s): PROCALCITON, LATICACIDVEN in the last 168 hours.  No results found for this or any previous visit (from the past 240 hour(s)).       Radiology Studies: Dg Chest 2 View  Result Date: 10/22/2017 CLINICAL DATA:  Acute stroke; history COPD, hypertension, smoker EXAM: CHEST - 2 VIEW COMPARISON:  12/16/2015 FINDINGS: Upper normal heart size. Mild tortuosity of thoracic aorta. Pulmonary vascularity normal. Minimal bronchitic and emphysematous changes question COPD. No acute infiltrate, pleural effusion, or pneumothorax. Bones demineralized. IMPRESSION: Question COPD changes. No acute infiltrate. Electronically Signed   By: Lavonia Dana M.D.   On: 10/22/2017 21:56   Ct Head Wo Contrast  Result Date: 10/22/2017 CLINICAL DATA:  Left leg and left arm  numbness EXAM: CT HEAD WITHOUT CONTRAST TECHNIQUE: Contiguous axial images were obtained from the base of the skull through the vertex without intravenous contrast. COMPARISON:  08/21/2012 FINDINGS: Brain: Chronic white matter ischemia is noted and stable from the prior exam. No findings to suggest acute hemorrhage, acute infarction or space-occupying mass lesion are noted. Vascular: Vertebrobasilar ectasia is again identified and stable. Skull: Normal. Negative for fracture or focal lesion. Sinuses/Orbits: No acute finding. Other: None. IMPRESSION: Chronic white matter ischemic changes are noted. No acute abnormality is identified. Electronically Signed   By: Inez Catalina M.D.   On: 10/22/2017 16:06   Mr Jodene Nam Head Wo Contrast  Result Date: 10/22/2017 CLINICAL DATA:  Initial evaluation for acute left-sided numbness for 1 day. EXAM: MRI HEAD WITHOUT CONTRAST MRA HEAD WITHOUT CONTRAST TECHNIQUE: Multiplanar, multiecho pulse sequences of the brain and surrounding structures were obtained  without intravenous contrast. Angiographic images of the head were obtained using MRA technique without contrast. COMPARISON:  Prior CT from earlier same day. FINDINGS: MRI HEAD FINDINGS Brain: Examination limited by motion artifact. Additionally, patient was unable to tolerate the full length of the exam. Diffuse prominence of the CSF containing spaces compatible with generalized age-related cerebral atrophy. Patchy and confluent T2/FLAIR hyperintensity within the periventricular deep white matter both cerebral hemispheres most consistent with chronic small vessel ischemic disease, moderate in nature. Remote lacunar infarct present within the left thalamus. Approximate 15 mm focus of curvilinear restricted diffusion extending from the posterior right lentiform nucleus/internal capsule towards the lateral right thalamus, consistent with a small acute ischemic lacunar type infarct (series 3, images 84, 86). No obvious associated  hemorrhage on this limited exam. Additional 9 mm focus of vague diffusion abnormality at the right periventricular white matter superiorly (series 3, image 95), likely subacute small vessel ischemic changes. No other evidence for acute or subacute ischemia. Gray-white matter differentiation otherwise maintained. No areas of remote cortical infarction. No mass lesion, midline shift or mass effect. No hydrocephalus. No extra-axial fluid collection. Pituitary gland normal. Vascular: Hypoplastic right vertebral artery not well visualized. Remainder of the major intracranial vascular flow voids maintained at the skull base. Skull and upper cervical spine: Craniocervical junction normal. No focal marrow replacing lesion. No scalp soft tissue abnormality. Sinuses/Orbits: Globes and orbital soft tissues within normal limits. Scattered mucosal thickening within the ethmoidal air cells and maxillary sinuses. Paranasal sinuses are otherwise clear. No significant mastoid effusion. Other: None. MRA HEAD FINDINGS ANTERIOR CIRCULATION: Examination moderately degraded by motion artifact. Distal cervical segments of the internal carotid arteries are patent with antegrade flow. Petrous, cavernous, and supraclinoid segments patent without hemodynamically significant stenosis A1 segments patent bilaterally. Right A1 hypoplastic. Anterior communicating artery grossly normal. Anterior cerebral arteries patent to their distal aspects without obvious stenosis on this motion degraded exam. M1 segments patent bilaterally. No obvious proximal M2 occlusion. Distal MCA branches well perfused and fairly symmetric. POSTERIOR CIRCULATION: Left vertebral artery dominant and diffusely tortuous, crossing the midline prior to the vertebrobasilar junction. Right vertebral artery hypoplastic but patent as well. Left PICA patent. Right PICA not visualized. Basilar tortuous but grossly patent without stenosis, although evaluation limited by motion.  Superior cerebral arteries not well assessed on this exam due to motion. Both of the posterior cerebral arteries appear to be primarily supplied via the basilar. PCAs patent proximally, not well assessed distally due to motion. No obvious intracranial aneurysm on this motion degraded study. IMPRESSION: MRI HEAD IMPRESSION: 1. Limited exam due to patient's inability to tolerate the full length of the study as well as extensive motion artifact. 2. 15 mm acute ischemic lacunar type infarct involving the posterior right basal ganglia. 3. Additional 9 mm focus of diffusion abnormality involving the right periventricular white matter, most consistent with sequelae of subacute small vessel ischemic changes. 4. Atrophy with moderate chronic small vessel ischemic disease. MRA HEAD IMPRESSION: 1. Technically limited exam due to extensive motion artifact. 2. Negative intracranial MRA for large vessel occlusion. No obvious hemodynamically significant or correctable stenosis identified. Electronically Signed   By: Jeannine Boga M.D.   On: 10/22/2017 19:58   Mr Brain Wo Contrast  Result Date: 10/22/2017 CLINICAL DATA:  Initial evaluation for acute left-sided numbness for 1 day. EXAM: MRI HEAD WITHOUT CONTRAST MRA HEAD WITHOUT CONTRAST TECHNIQUE: Multiplanar, multiecho pulse sequences of the brain and surrounding structures were obtained without intravenous contrast. Angiographic images of the  head were obtained using MRA technique without contrast. COMPARISON:  Prior CT from earlier same day. FINDINGS: MRI HEAD FINDINGS Brain: Examination limited by motion artifact. Additionally, patient was unable to tolerate the full length of the exam. Diffuse prominence of the CSF containing spaces compatible with generalized age-related cerebral atrophy. Patchy and confluent T2/FLAIR hyperintensity within the periventricular deep white matter both cerebral hemispheres most consistent with chronic small vessel ischemic disease,  moderate in nature. Remote lacunar infarct present within the left thalamus. Approximate 15 mm focus of curvilinear restricted diffusion extending from the posterior right lentiform nucleus/internal capsule towards the lateral right thalamus, consistent with a small acute ischemic lacunar type infarct (series 3, images 84, 86). No obvious associated hemorrhage on this limited exam. Additional 9 mm focus of vague diffusion abnormality at the right periventricular white matter superiorly (series 3, image 95), likely subacute small vessel ischemic changes. No other evidence for acute or subacute ischemia. Gray-white matter differentiation otherwise maintained. No areas of remote cortical infarction. No mass lesion, midline shift or mass effect. No hydrocephalus. No extra-axial fluid collection. Pituitary gland normal. Vascular: Hypoplastic right vertebral artery not well visualized. Remainder of the major intracranial vascular flow voids maintained at the skull base. Skull and upper cervical spine: Craniocervical junction normal. No focal marrow replacing lesion. No scalp soft tissue abnormality. Sinuses/Orbits: Globes and orbital soft tissues within normal limits. Scattered mucosal thickening within the ethmoidal air cells and maxillary sinuses. Paranasal sinuses are otherwise clear. No significant mastoid effusion. Other: None. MRA HEAD FINDINGS ANTERIOR CIRCULATION: Examination moderately degraded by motion artifact. Distal cervical segments of the internal carotid arteries are patent with antegrade flow. Petrous, cavernous, and supraclinoid segments patent without hemodynamically significant stenosis A1 segments patent bilaterally. Right A1 hypoplastic. Anterior communicating artery grossly normal. Anterior cerebral arteries patent to their distal aspects without obvious stenosis on this motion degraded exam. M1 segments patent bilaterally. No obvious proximal M2 occlusion. Distal MCA branches well perfused and  fairly symmetric. POSTERIOR CIRCULATION: Left vertebral artery dominant and diffusely tortuous, crossing the midline prior to the vertebrobasilar junction. Right vertebral artery hypoplastic but patent as well. Left PICA patent. Right PICA not visualized. Basilar tortuous but grossly patent without stenosis, although evaluation limited by motion. Superior cerebral arteries not well assessed on this exam due to motion. Both of the posterior cerebral arteries appear to be primarily supplied via the basilar. PCAs patent proximally, not well assessed distally due to motion. No obvious intracranial aneurysm on this motion degraded study. IMPRESSION: MRI HEAD IMPRESSION: 1. Limited exam due to patient's inability to tolerate the full length of the study as well as extensive motion artifact. 2. 15 mm acute ischemic lacunar type infarct involving the posterior right basal ganglia. 3. Additional 9 mm focus of diffusion abnormality involving the right periventricular white matter, most consistent with sequelae of subacute small vessel ischemic changes. 4. Atrophy with moderate chronic small vessel ischemic disease. MRA HEAD IMPRESSION: 1. Technically limited exam due to extensive motion artifact. 2. Negative intracranial MRA for large vessel occlusion. No obvious hemodynamically significant or correctable stenosis identified. Electronically Signed   By: Jeannine Boga M.D.   On: 10/22/2017 19:58   US Carotid Bilateral (at [facility] Only)  Result Date: 10/23/2017 CLINICAL DATA:  57 year old male with left-sided numbness for 1 day EXAM: BILATERAL CAROTID DUPLEX ULTRASOUND TECHNIQUE: Pearline Cables scale imaging, color Doppler and duplex ultrasound were performed of bilateral carotid and vertebral arteries in the neck. COMPARISON:  Brain MRI 10/22/2017 FINDINGS: Criteria: Quantification  of carotid stenosis is based on velocity parameters that correlate the residual internal carotid diameter with NASCET-based stenosis levels,  using the diameter of the distal internal carotid lumen as the denominator for stenosis measurement. The following velocity measurements were obtained: RIGHT ICA: 76/26 cm/sec CCA: 59/97 cm/sec SYSTOLIC ICA/CCA RATIO:  1.2 ECA:  67 cm/sec LEFT ICA: 66/19 cm/sec CCA: 74/14 cm/sec SYSTOLIC ICA/CCA RATIO:  0.7 ECA:  65 cm/sec RIGHT CAROTID ARTERY: Mild smooth heterogeneous atherosclerotic plaque in the proximal internal carotid artery. By peak systolic velocity criteria, the estimated stenosis remains less than 50%. RIGHT VERTEBRAL ARTERY:  Patent with normal antegrade flow. LEFT CAROTID ARTERY: Mild heterogeneous atherosclerotic plaque in the proximal internal carotid artery. By peak systolic velocity criteria, the estimated stenosis remains less than 50%. LEFT VERTEBRAL ARTERY:  Patent with normal antegrade flow. IMPRESSION: 1. Mild (1-49%) stenosis proximal right internal carotid artery secondary to mild heterogeneous atherosclerotic plaque. 2. Mild (1-49%) stenosis proximal left internal carotid artery secondary to mild heterogeneous atherosclerotic plaque. 3. Vertebral arteries are patent with normal antegrade flow. Signed, Criselda Peaches, MD, [facility] Electronically Signed   By: Jacqulynn Cadet M.D.   On: 10/23/2017 14:57        Scheduled Meds: . aspirin  300 mg Rectal Daily   Or  . aspirin  325 mg Oral Daily  . atorvastatin  40 mg Oral q1800  . carbamazepine  400 mg Oral QID  . enoxaparin (LOVENOX) injection  40 mg Subcutaneous Q24H  . fluticasone  2 spray Each Nare Daily  . fluticasone furoate-vilanterol  1 puff Inhalation Daily   And  . umeclidinium bromide  1 puff Inhalation Daily  . gabapentin  600 mg Oral TID  . insulin aspart  0-9 Units Subcutaneous TID WC  . lisinopril  20 mg Oral Daily   Continuous Infusions: . sodium chloride 100 mL/hr (10/23/17 0904)     LOS: 1 day    Time spent:  73mins    Kathie Dike, MD [facility] Pager [phone]  If 7PM-7AM, please contact night-coverage [email or web address] Password [facility] 10/23/2017, 5:26 PM

## 2017-10-23 NOTE — Progress Notes (Signed)
SLP Cancellation Note  Patient Details Name: Matthew [name] MRN: [id number] DOB: November 05, 1960   Cancelled treatment:       Reason Eval/Treat Not Completed: SLP screened, no needs identified, will sign off. SLP screened Pt in room. Pt denies any changes in swallowing, speech, language, or cognition. MRI shows acute infarct in basal ganglia. Pt's son present for screen and confirms that Pt is at baseline in terms of cognitive linguistic skills. SLE will be deferred at this time. Reconsult if indicated. SLP will sign off.   Thank you,  Genene Churn, [place] 10/23/2017, 5:41 PM
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
## 2017-10-24 LAB — GLUCOSE, CAPILLARY
GLUCOSE-CAPILLARY: 109 mg/dL — AB (ref 70–99)
Glucose-Capillary: 101 mg/dL — ABNORMAL HIGH (ref 70–99)

## 2017-10-24 LAB — HIV ANTIBODY (ROUTINE TESTING W REFLEX): HIV Screen 4th Generation wRfx: NONREACTIVE

## 2017-10-24 MED ORDER — HYDROXYZINE HCL 25 MG PO TABS: 25.0000 mg | ORAL_TABLET | Freq: Three times a day (TID) | ORAL | 0 refills | Status: DC | PRN

## 2017-10-24 MED ORDER — AMLODIPINE BESYLATE 5 MG PO TABS
5.0000 mg | ORAL_TABLET | Freq: Every day | ORAL | Status: DC
  Administered 2017-10-24: 5 mg via ORAL
  Filled 2017-10-24: qty 1

## 2017-10-24 MED ORDER — ATORVASTATIN CALCIUM 40 MG PO TABS: 40.0000 mg | ORAL_TABLET | Freq: Every day | ORAL | 1 refills | Status: DC

## 2017-10-24 MED ORDER — FLUTICASONE FUROATE-VILANTEROL 100-25 MCG/INH IN AEPB
1.0000 | INHALATION_SPRAY | Freq: Every day | RESPIRATORY_TRACT | Status: DC
  Administered 2017-10-24: 1 via RESPIRATORY_TRACT
  Filled 2017-10-24: qty 28

## 2017-10-24 MED ORDER — ASPIRIN 325 MG PO TABS: 325.0000 mg | ORAL_TABLET | Freq: Every day | ORAL | 1 refills | Status: DC

## 2017-10-24 MED ORDER — BLOOD PRESSURE KIT: PACK | 0 refills | Status: DC

## 2017-10-24 MED ORDER — UMECLIDINIUM BROMIDE 62.5 MCG/INH IN AEPB
1.0000 | INHALATION_SPRAY | Freq: Every day | RESPIRATORY_TRACT | Status: DC
  Administered 2017-10-24: 1 via RESPIRATORY_TRACT
  Filled 2017-10-24: qty 7

## 2017-10-24 NOTE — Discharge Summary (Signed)
Physician Discharge Summary  Matthew [name] VOZ:[id number] DOB: 1960/10/10 DOA: 10/22/2017  PCP: [name], FNP  Admit date: 10/22/2017 Discharge date: 10/24/2017  Admitted From: Home Disposition: Home  Recommendations for Outpatient Follow-up:  1. Follow up with PCP in 1-2 weeks 2. Consider outpatient sleep study  Discharge Condition: Stable CODE STATUS: Full code Diet recommendation: Heart healthy  Brief/Interim Summary: 57 year old male who presents to the hospital with left-sided weakness and numbness in his upper and lower extremities.  MRI brain confirmed 15 mm acute ischemic lacunar infarct in the posterior right basal ganglia.  Echocardiogram did not show any acute findings.  Carotid Doppler did not show any significant stenosis bilaterally.  LDL was found to be elevated and he was started on a statin.  Blood pressure also elevated, he was continued on lisinopril/hydrochlorothiazide and amlodipine.  This will need to be further titrated as an outpatient.  A1c was found to be less than 6.  Since he is not chronically on antiplatelet therapy, he was started on daily aspirin.  He was seen by neurology with no further recommendations.  The remainder of his medical problems remained stable.  He was seen by physical therapy and no further therapy was recommended.  Discharge Diagnoses:  Principal Problem:   Acute CVA (cerebrovascular accident) [facility]) Active Problems:   Trigeminal neuralgia   HTN (hypertension)   COPD (chronic obstructive pulmonary disease) ([place])   Hyperlipidemia   Tobacco use   Hyperglycemia    Discharge Instructions  Discharge Instructions    Diet - low sodium heart healthy   Complete by:  As directed    Increase activity slowly   Complete by:  As directed      Allergies as of 10/24/2017      Reactions   Penicillins       Medication List    TAKE these medications   albuterol 108 (90 Base) MCG/ACT inhaler Commonly known as:  PROVENTIL HFA;VENTOLIN  HFA INHALE 2 PUFFS INTO LUNGS EVERY 6 HOURS AS NEEDED FOR WHEEZE OR SHORTNESS OF BREATH What changed:    how much to take  how to take this  when to take this  reasons to take this  additional instructions   amLODipine 10 MG tablet Commonly known as:  NORVASC TAKE 1 TABLET BY MOUTH EVERY DAY   aspirin 325 MG tablet Take 1 tablet (325 mg total) by mouth daily. Start taking on:  10/25/2017   atorvastatin 40 MG tablet Commonly known as:  LIPITOR Take 1 tablet (40 mg total) by mouth daily at 6 PM.   Blood Pressure Kit Use to check your blood pressure once daily   carbamazepine 200 MG tablet Commonly known as:  TEGRETOL TAKE 2 TABLETS BY MOUTH 4 TIMES A DAY What changed:  See the new instructions.   fluticasone 50 MCG/ACT nasal spray Commonly known as:  FLONASE USE 2 SPRAYS INTO BOTH NOSTRILS ONCE DAILY What changed:  See the new instructions.   Fluticasone-Umeclidin-Vilant 100-62.5-25 MCG/INH Aepb Inhale 1 puff into the lungs daily.   gabapentin 600 MG tablet Commonly known as:  NEURONTIN Take 1 tablet in the morning, 1 tablet in the afternoon, and 1.5 tablet at bedtime What changed:    how much to take  how to take this  when to take this  additional instructions   hydrochlorothiazide 12.5 MG capsule Commonly known as:  MICROZIDE TAKE 1 CAPSULE BY MOUTH EVERY DAY What changed:  how much to take   hydrOXYzine 25 MG tablet  Commonly known as:  ATARAX/VISTARIL Take 1 tablet (25 mg total) by mouth 3 (three) times daily as needed for anxiety.   lisinopril-hydrochlorothiazide 20-12.5 MG tablet Commonly known as:  PRINZIDE,ZESTORETIC Take 1 tablet by mouth daily.   Vitamin D (Ergocalciferol) 50000 units Caps capsule Commonly known as:  DRISDOL Take 1 capsule (50,000 Units total) by mouth every 7 (seven) days.       Allergies  Allergen Reactions  . Penicillins     Consultations:  Neurology   Procedures/Studies: Dg Chest 2 View  Result Date:  10/22/2017 CLINICAL DATA:  Acute stroke; history COPD, hypertension, smoker EXAM: CHEST - 2 VIEW COMPARISON:  12/16/2015 FINDINGS: Upper normal heart size. Mild tortuosity of thoracic aorta. Pulmonary vascularity normal. Minimal bronchitic and emphysematous changes question COPD. No acute infiltrate, pleural effusion, or pneumothorax. Bones demineralized. IMPRESSION: Question COPD changes. No acute infiltrate. Electronically Signed   By: Lavonia Dana M.D.   On: 10/22/2017 21:56   Ct Head Wo Contrast  Result Date: 10/22/2017 CLINICAL DATA:  Left leg and left arm numbness EXAM: CT HEAD WITHOUT CONTRAST TECHNIQUE: Contiguous axial images were obtained from the base of the skull through the vertex without intravenous contrast. COMPARISON:  08/21/2012 FINDINGS: Brain: Chronic white matter ischemia is noted and stable from the prior exam. No findings to suggest acute hemorrhage, acute infarction or space-occupying mass lesion are noted. Vascular: Vertebrobasilar ectasia is again identified and stable. Skull: Normal. Negative for fracture or focal lesion. Sinuses/Orbits: No acute finding. Other: None. IMPRESSION: Chronic white matter ischemic changes are noted. No acute abnormality is identified. Electronically Signed   By: Inez Catalina M.D.   On: 10/22/2017 16:06   Mr Jodene Nam Head Wo Contrast  Result Date: 10/22/2017 CLINICAL DATA:  Initial evaluation for acute left-sided numbness for 1 day. EXAM: MRI HEAD WITHOUT CONTRAST MRA HEAD WITHOUT CONTRAST TECHNIQUE: Multiplanar, multiecho pulse sequences of the brain and surrounding structures were obtained without intravenous contrast. Angiographic images of the head were obtained using MRA technique without contrast. COMPARISON:  Prior CT from earlier same day. FINDINGS: MRI HEAD FINDINGS Brain: Examination limited by motion artifact. Additionally, patient was unable to tolerate the full length of the exam. Diffuse prominence of the CSF containing spaces compatible with  generalized age-related cerebral atrophy. Patchy and confluent T2/FLAIR hyperintensity within the periventricular deep white matter both cerebral hemispheres most consistent with chronic small vessel ischemic disease, moderate in nature. Remote lacunar infarct present within the left thalamus. Approximate 15 mm focus of curvilinear restricted diffusion extending from the posterior right lentiform nucleus/internal capsule towards the lateral right thalamus, consistent with a small acute ischemic lacunar type infarct (series 3, images 84, 86). No obvious associated hemorrhage on this limited exam. Additional 9 mm focus of vague diffusion abnormality at the right periventricular white matter superiorly (series 3, image 95), likely subacute small vessel ischemic changes. No other evidence for acute or subacute ischemia. Gray-white matter differentiation otherwise maintained. No areas of remote cortical infarction. No mass lesion, midline shift or mass effect. No hydrocephalus. No extra-axial fluid collection. Pituitary gland normal. Vascular: Hypoplastic right vertebral artery not well visualized. Remainder of the major intracranial vascular flow voids maintained at the skull base. Skull and upper cervical spine: Craniocervical junction normal. No focal marrow replacing lesion. No scalp soft tissue abnormality. Sinuses/Orbits: Globes and orbital soft tissues within normal limits. Scattered mucosal thickening within the ethmoidal air cells and maxillary sinuses. Paranasal sinuses are otherwise clear. No significant mastoid effusion. Other: None. MRA HEAD FINDINGS ANTERIOR  CIRCULATION: Examination moderately degraded by motion artifact. Distal cervical segments of the internal carotid arteries are patent with antegrade flow. Petrous, cavernous, and supraclinoid segments patent without hemodynamically significant stenosis A1 segments patent bilaterally. Right A1 hypoplastic. Anterior communicating artery grossly normal.  Anterior cerebral arteries patent to their distal aspects without obvious stenosis on this motion degraded exam. M1 segments patent bilaterally. No obvious proximal M2 occlusion. Distal MCA branches well perfused and fairly symmetric. POSTERIOR CIRCULATION: Left vertebral artery dominant and diffusely tortuous, crossing the midline prior to the vertebrobasilar junction. Right vertebral artery hypoplastic but patent as well. Left PICA patent. Right PICA not visualized. Basilar tortuous but grossly patent without stenosis, although evaluation limited by motion. Superior cerebral arteries not well assessed on this exam due to motion. Both of the posterior cerebral arteries appear to be primarily supplied via the basilar. PCAs patent proximally, not well assessed distally due to motion. No obvious intracranial aneurysm on this motion degraded study. IMPRESSION: MRI HEAD IMPRESSION: 1. Limited exam due to patient's inability to tolerate the full length of the study as well as extensive motion artifact. 2. 15 mm acute ischemic lacunar type infarct involving the posterior right basal ganglia. 3. Additional 9 mm focus of diffusion abnormality involving the right periventricular white matter, most consistent with sequelae of subacute small vessel ischemic changes. 4. Atrophy with moderate chronic small vessel ischemic disease. MRA HEAD IMPRESSION: 1. Technically limited exam due to extensive motion artifact. 2. Negative intracranial MRA for large vessel occlusion. No obvious hemodynamically significant or correctable stenosis identified. Electronically Signed   By: Jeannine Boga M.D.   On: 10/22/2017 19:58   Mr Brain Wo Contrast  Result Date: 10/22/2017 CLINICAL DATA:  Initial evaluation for acute left-sided numbness for 1 day. EXAM: MRI HEAD WITHOUT CONTRAST MRA HEAD WITHOUT CONTRAST TECHNIQUE: Multiplanar, multiecho pulse sequences of the brain and surrounding structures were obtained without intravenous  contrast. Angiographic images of the head were obtained using MRA technique without contrast. COMPARISON:  Prior CT from earlier same day. FINDINGS: MRI HEAD FINDINGS Brain: Examination limited by motion artifact. Additionally, patient was unable to tolerate the full length of the exam. Diffuse prominence of the CSF containing spaces compatible with generalized age-related cerebral atrophy. Patchy and confluent T2/FLAIR hyperintensity within the periventricular deep white matter both cerebral hemispheres most consistent with chronic small vessel ischemic disease, moderate in nature. Remote lacunar infarct present within the left thalamus. Approximate 15 mm focus of curvilinear restricted diffusion extending from the posterior right lentiform nucleus/internal capsule towards the lateral right thalamus, consistent with a small acute ischemic lacunar type infarct (series 3, images 84, 86). No obvious associated hemorrhage on this limited exam. Additional 9 mm focus of vague diffusion abnormality at the right periventricular white matter superiorly (series 3, image 95), likely subacute small vessel ischemic changes. No other evidence for acute or subacute ischemia. Gray-white matter differentiation otherwise maintained. No areas of remote cortical infarction. No mass lesion, midline shift or mass effect. No hydrocephalus. No extra-axial fluid collection. Pituitary gland normal. Vascular: Hypoplastic right vertebral artery not well visualized. Remainder of the major intracranial vascular flow voids maintained at the skull base. Skull and upper cervical spine: Craniocervical junction normal. No focal marrow replacing lesion. No scalp soft tissue abnormality. Sinuses/Orbits: Globes and orbital soft tissues within normal limits. Scattered mucosal thickening within the ethmoidal air cells and maxillary sinuses. Paranasal sinuses are otherwise clear. No significant mastoid effusion. Other: None. MRA HEAD FINDINGS ANTERIOR  CIRCULATION: Examination moderately degraded by motion  artifact. Distal cervical segments of the internal carotid arteries are patent with antegrade flow. Petrous, cavernous, and supraclinoid segments patent without hemodynamically significant stenosis A1 segments patent bilaterally. Right A1 hypoplastic. Anterior communicating artery grossly normal. Anterior cerebral arteries patent to their distal aspects without obvious stenosis on this motion degraded exam. M1 segments patent bilaterally. No obvious proximal M2 occlusion. Distal MCA branches well perfused and fairly symmetric. POSTERIOR CIRCULATION: Left vertebral artery dominant and diffusely tortuous, crossing the midline prior to the vertebrobasilar junction. Right vertebral artery hypoplastic but patent as well. Left PICA patent. Right PICA not visualized. Basilar tortuous but grossly patent without stenosis, although evaluation limited by motion. Superior cerebral arteries not well assessed on this exam due to motion. Both of the posterior cerebral arteries appear to be primarily supplied via the basilar. PCAs patent proximally, not well assessed distally due to motion. No obvious intracranial aneurysm on this motion degraded study. IMPRESSION: MRI HEAD IMPRESSION: 1. Limited exam due to patient's inability to tolerate the full length of the study as well as extensive motion artifact. 2. 15 mm acute ischemic lacunar type infarct involving the posterior right basal ganglia. 3. Additional 9 mm focus of diffusion abnormality involving the right periventricular white matter, most consistent with sequelae of subacute small vessel ischemic changes. 4. Atrophy with moderate chronic small vessel ischemic disease. MRA HEAD IMPRESSION: 1. Technically limited exam due to extensive motion artifact. 2. Negative intracranial MRA for large vessel occlusion. No obvious hemodynamically significant or correctable stenosis identified. Electronically Signed   By: Jeannine Boga M.D.   On: 10/22/2017 19:58   US Carotid Bilateral (at [facility] Only)  Result Date: 10/23/2017 CLINICAL DATA:  57 year old male with left-sided numbness for 1 day EXAM: BILATERAL CAROTID DUPLEX ULTRASOUND TECHNIQUE: Pearline Cables scale imaging, color Doppler and duplex ultrasound were performed of bilateral carotid and vertebral arteries in the neck. COMPARISON:  Brain MRI 10/22/2017 FINDINGS: Criteria: Quantification of carotid stenosis is based on velocity parameters that correlate the residual internal carotid diameter with NASCET-based stenosis levels, using the diameter of the distal internal carotid lumen as the denominator for stenosis measurement. The following velocity measurements were obtained: RIGHT ICA: 76/26 cm/sec CCA: 33/43 cm/sec SYSTOLIC ICA/CCA RATIO:  1.2 ECA:  67 cm/sec LEFT ICA: 66/19 cm/sec CCA: 56/86 cm/sec SYSTOLIC ICA/CCA RATIO:  0.7 ECA:  65 cm/sec RIGHT CAROTID ARTERY: Mild smooth heterogeneous atherosclerotic plaque in the proximal internal carotid artery. By peak systolic velocity criteria, the estimated stenosis remains less than 50%. RIGHT VERTEBRAL ARTERY:  Patent with normal antegrade flow. LEFT CAROTID ARTERY: Mild heterogeneous atherosclerotic plaque in the proximal internal carotid artery. By peak systolic velocity criteria, the estimated stenosis remains less than 50%. LEFT VERTEBRAL ARTERY:  Patent with normal antegrade flow. IMPRESSION: 1. Mild (1-49%) stenosis proximal right internal carotid artery secondary to mild heterogeneous atherosclerotic plaque. 2. Mild (1-49%) stenosis proximal left internal carotid artery secondary to mild heterogeneous atherosclerotic plaque. 3. Vertebral arteries are patent with normal antegrade flow. Signed, Criselda Peaches, MD, [facility] Electronically Signed   By: Jacqulynn Cadet M.D.   On: 10/23/2017 14:57       Subjective: Left-sided weakness is better.   No shortness of breath.  Discharge Exam: Vitals:   10/23/17 2248 10/24/17 0249 10/24/17 0654 10/24/17 0812  BP: (!) 174/101 (!) 163/93 (!) 157/96   Pulse: (!) 56 (!) 52 (!) 52   Resp: _0 Temp: 98.8 F (37.1 C)  98.4 F (36.9 C) 97.7 F (36.5 C)   TempSrc: Oral Oral Oral   SpO2: 97% 97% 99% 97%  Weight:      Height:        General: Pt is alert, awake, not in acute distress Cardiovascular: RRR, S1/S2 +, no rubs, no gallops Respiratory: CTA bilaterally, no wheezing, no rhonchi Abdominal: Soft, NT, ND, bowel sounds + Extremities: no edema, no cyanosis    The results of significant diagnostics from this hospitalization (including imaging, microbiology, ancillary and laboratory) are listed below for reference.     Microbiology: No results found for this or any previous visit (from the past 240 hour(s)).   Labs: BNP (last 3 results) No results for input(s): BNP in the last 8760 hours. Basic Metabolic Panel: Recent Labs  Lab 10/22/17 1504 10/23/17 0513  NA 136 139  K 3.6 3.9  CL 100 104  CO2 27 27  GLUCOSE 143* 111*  BUN 10 11  CREATININE 0.60* 0.60*  CALCIUM 8.7* 8.5*  MG 2.0  --   PHOS 3.5  --    Liver Function Tests: Recent Labs  Lab 10/22/17 1504  AST 23  ALT 25  ALKPHOS 108  BILITOT 0.4  PROT 7.5  ALBUMIN 4.0   No results for input(s): LIPASE, AMYLASE in the last 168 hours. No results for input(s): AMMONIA in the last 168 hours. CBC: Recent Labs  Lab 10/22/17 1504  WBC 7.6  NEUTROABS 5.6  HGB 14.6  HCT 43.0  MCV 91.3  PLT 205   Cardiac Enzymes: No results for input(s): CKTOTAL, CKMB, CKMBINDEX, TROPONINI in the last 168 hours. BNP: Invalid input(s): POCBNP CBG: Recent Labs  Lab 10/23/17 1119 10/23/17 1649 10/23/17 2130 10/24/17 0520 10/24/17 0737  GLUCAP 115* 102* 90 101* 109*   D-Dimer Recent Labs    10/22/17 1504  DDIMER 0.30   Hgb A1c Recent Labs    10/23/17 0513  HGBA1C 5.6   Lipid Profile Recent Labs     10/23/17 0513  CHOL 195  HDL 54  LDLCALC 120*  TRIG 103  CHOLHDL 3.6   Thyroid function studies No results for input(s): TSH, T4TOTAL, T3FREE, THYROIDAB in the last 72 hours.  Invalid input(s): FREET3 Anemia work up No results for input(s): VITAMINB12, FOLATE, FERRITIN, TIBC, IRON, RETICCTPCT in the last 72 hours. Urinalysis    Component Value Date/Time   COLORURINE STRAW (A) 10/22/2017 1441   APPEARANCEUR CLEAR 10/22/2017 1441   APPEARANCEUR Clear 12/02/2015 1621   LABSPEC 1.005 10/22/2017 1441   PHURINE 6.0 10/22/2017 1441   GLUCOSEU NEGATIVE 10/22/2017 1441   HGBUR NEGATIVE 10/22/2017 1441   BILIRUBINUR NEGATIVE 10/22/2017 1441   BILIRUBINUR Negative 12/02/2015 1621   KETONESUR NEGATIVE 10/22/2017 1441   PROTEINUR NEGATIVE 10/22/2017 1441   UROBILINOGEN 0.2 08/21/2012 1549   NITRITE NEGATIVE 10/22/2017 1441   LEUKOCYTESUR NEGATIVE 10/22/2017 1441   LEUKOCYTESUR Negative 12/02/2015 1621   Sepsis Labs Invalid input(s): PROCALCITONIN,  WBC,  LACTICIDVEN Microbiology No results found for this or any previous visit (from the past 240 hour(s)).   Time coordinating discharge: 47mns  SIGNED:   JKathie Dike MD  [facility] 10/24/2017, 10:11 AM Pager   If 7PM-7AM, please contact night-coverage [email or web address] Password [email or web address]

## 2017-10-24 NOTE — Progress Notes (Signed)
[name] discharged Home per MD order.  Discharge instructions reviewed and discussed with the patient, all questions and concerns answered. Copy of instructions and scripts given to patient.  Allergies as of 10/24/2017      Reactions   Penicillins       Medication List    TAKE these medications   albuterol 108 (90 Base) MCG/ACT inhaler Commonly known as:  PROVENTIL HFA;VENTOLIN HFA INHALE 2 PUFFS INTO LUNGS EVERY 6 HOURS AS NEEDED FOR WHEEZE OR SHORTNESS OF BREATH What changed:    how much to take  how to take this  when to take this  reasons to take this  additional instructions   amLODipine 10 MG tablet Commonly known as:  NORVASC TAKE 1 TABLET BY MOUTH EVERY DAY   aspirin 325 MG tablet Take 1 tablet (325 mg total) by mouth daily. Start taking on:  10/25/2017   atorvastatin 40 MG tablet Commonly known as:  LIPITOR Take 1 tablet (40 mg total) by mouth daily at 6 PM.   Blood Pressure Kit Use to check your blood pressure once daily   carbamazepine 200 MG tablet Commonly known as:  TEGRETOL TAKE 2 TABLETS BY MOUTH 4 TIMES A DAY What changed:  See the new instructions.   fluticasone 50 MCG/ACT nasal spray Commonly known as:  FLONASE USE 2 SPRAYS INTO BOTH NOSTRILS ONCE DAILY What changed:  See the new instructions.   Fluticasone-Umeclidin-Vilant 100-62.5-25 MCG/INH Aepb Inhale 1 puff into the lungs daily.   gabapentin 600 MG tablet Commonly known as:  NEURONTIN Take 1 tablet in the morning, 1 tablet in the afternoon, and 1.5 tablet at bedtime What changed:    how much to take  how to take this  when to take this  additional instructions   hydrochlorothiazide 12.5 MG capsule Commonly known as:  MICROZIDE TAKE 1 CAPSULE BY MOUTH EVERY DAY What changed:  how much to take   hydrOXYzine 25 MG tablet Commonly known as:  ATARAX/VISTARIL Take 1 tablet (25 mg total) by mouth 3 (three) times daily as needed for anxiety.   lisinopril-hydrochlorothiazide  20-12.5 MG tablet Commonly known as:  PRINZIDE,ZESTORETIC Take 1 tablet by mouth daily.   Vitamin D (Ergocalciferol) 50000 units Caps capsule Commonly known as:  DRISDOL Take 1 capsule (50,000 Units total) by mouth every 7 (seven) days.       Patients skin is clean, dry and intact, no evidence of skin break down. IV site discontinued and catheter remains intact. Site without signs and symptoms of complications. Dressing and pressure applied.  Patient escorted to elevator,  no distress noted upon discharge.  Matthew [name] 10/24/2017 12:04 PM

## 2017-10-24 NOTE — Progress Notes (Signed)
We have been informed by the primary team that a TEE is not required at this time, we will d/c the initial cardiology consult   Carlyle Dolly MD

## 2017-10-25 ENCOUNTER — Ambulatory Visit (INDEPENDENT_AMBULATORY_CARE_PROVIDER_SITE_OTHER): Payer: Medicare Other | Admitting: Family

## 2017-10-25 ENCOUNTER — Encounter: Payer: Self-pay | Admitting: Family

## 2017-10-25 VITALS — BP 151/86 | HR 50 | Temp 97.6°F | Ht 69.0 in | Wt 243.0 lb

## 2017-10-25 DIAGNOSIS — I10 Essential (primary) hypertension: Secondary | ICD-10-CM

## 2017-10-25 DIAGNOSIS — E782 Mixed hyperlipidemia: Secondary | ICD-10-CM | POA: Diagnosis not present

## 2017-10-25 DIAGNOSIS — Z09 Encounter for follow-up examination after completed treatment for conditions other than malignant neoplasm: Secondary | ICD-10-CM

## 2017-10-25 DIAGNOSIS — Z720 Tobacco use: Secondary | ICD-10-CM | POA: Diagnosis not present

## 2017-10-25 DIAGNOSIS — R531 Weakness: Secondary | ICD-10-CM | POA: Diagnosis not present

## 2017-10-25 DIAGNOSIS — I6930 Unspecified sequelae of cerebral infarction: Secondary | ICD-10-CM | POA: Diagnosis not present

## 2017-10-25 MED ORDER — LISINOPRIL-HYDROCHLOROTHIAZIDE 20-12.5 MG PO TABS: 2.0000 | ORAL_TABLET | Freq: Every day | ORAL | 3 refills | Status: DC

## 2017-10-25 NOTE — Progress Notes (Signed)
   Subjective:    Patient ID: Matthew [name], male    DOB: Jul 03, 1960, 57 y.o.   MRN: [id number]  Pt presents to the office today for hospital follow up. He went to the ED on 10/22/17 with left sided weakness and numbness. MRI brain showed 15 mm acute ischemic lacunar infarct.   He was started on statin and told to continue with BP medication.  Hypertension  This is a chronic problem. The current episode started more than 1 year ago. The problem has been waxing and waning since onset. The problem is uncontrolled. Associated symptoms include malaise/fatigue. Pertinent negatives include no headaches, peripheral edema or shortness of breath. Risk factors for coronary artery disease include dyslipidemia, obesity, male gender, smoking/tobacco exposure and sedentary lifestyle. The current treatment provides moderate improvement. Hypertensive end-organ damage includes CAD/MI.   Matthew [name] notes reviewed   Review of Systems  Constitutional: Positive for malaise/fatigue.  Respiratory: Negative for shortness of breath.   Neurological: Negative for headaches.  All other systems reviewed and are negative.      Objective:   Physical Exam  Constitutional: He is oriented to person, place, and time. He appears well-developed and well-nourished. No distress.  HENT:  Head: Normocephalic.  Right Ear: External ear normal.  Left Ear: External ear normal.  Mouth/Throat: Posterior oropharyngeal erythema present.  Eyes: Pupils are equal, round, and reactive to light. Right eye exhibits no discharge. Left eye exhibits no discharge.  Neck: Normal range of motion. Neck supple. No thyromegaly present.  Cardiovascular: Normal rate, regular rhythm, normal heart sounds and intact distal pulses.  No murmur heard. Pulmonary/Chest: Effort normal. No respiratory distress. He has no wheezes. He has rhonchi.  Abdominal: Soft. Bowel sounds are normal. He exhibits no distension. There is no tenderness.  Musculoskeletal:  Normal range of motion. He exhibits no edema or tenderness.  Neurological: He is alert and oriented to person, place, and time. He has normal reflexes. No cranial nerve deficit. Coordination and gait normal.  Skin: Skin is warm and dry. No rash noted. No erythema.  Psychiatric: He has a normal mood and affect. His behavior is normal. Judgment and thought content normal.  Vitals reviewed.   BP (!) 156/83   Pulse (!) 51   Temp 97.6 F (36.4 C) (Oral)   Ht '5\' 9"'$  (1.753 m)   Wt 243 lb (110.2 kg)   BMI 35.88 kg/m      Assessment & Plan:  Matthew [name] comes in today with chief complaint of Hospitalization Follow-up   Diagnosis and orders addressed:  1. Essential hypertension Will increase Lisinonpril-HCTZ to 40-25 from 20-12.5 mg -Dash diet information given -Exercise encouraged - Stress Management  -Continue current meds -RTO in 2 weeks  - lisinopril-hydrochlorothiazide (ZESTORETIC) 20-12.5 MG tablet; Take 2 tablets by mouth daily.  Dispense: 180 tablet; Refill: 3 - CBC with Differential/Platelet - CMP14+EGFR  2. Tobacco use Smoking cessation discussed - CBC with Differential/Platelet - CMP14+EGFR  3. Late effect of cerebrovascular accident (CVA) Good strength. Discussed he can return to daily routine, but the importance of resting and listening to his body - CBC with Differential/Platelet - CMP14+EGFR  4. Weakness - CBC with Differential/Platelet - CMP14+EGFR  5. Mixed hyperlipidemia - CBC with Differential/Platelet - CMP14+EGFR  6. Hospital discharge follow-up    Follow up plan: 2 weeks to recheck HTN   Evelina Dun, FNP

## 2017-10-25 NOTE — Patient Instructions (Signed)

## 2017-10-26 ENCOUNTER — Telehealth: Payer: Self-pay | Admitting: *Deleted

## 2017-10-26 ENCOUNTER — Other Ambulatory Visit: Payer: Self-pay | Admitting: Family

## 2017-10-26 DIAGNOSIS — G500 Trigeminal neuralgia: Secondary | ICD-10-CM

## 2017-10-26 LAB — CMP14+EGFR
A/G RATIO: 1.8 (ref 1.2–2.2)
ALK PHOS: 110 IU/L (ref 39–117)
ALT: 26 IU/L (ref 0–44)
AST: 22 IU/L (ref 0–40)
Albumin: 4.2 g/dL (ref 3.5–5.5)
BUN/Creatinine Ratio: 10 (ref 9–20)
BUN: 6 mg/dL (ref 6–24)
Bilirubin Total: 0.3 mg/dL (ref 0.0–1.2)
CALCIUM: 8.8 mg/dL (ref 8.7–10.2)
CHLORIDE: 94 mmol/L — AB (ref 96–106)
CO2: 24 mmol/L (ref 20–29)
Creatinine, Ser: 0.62 mg/dL — ABNORMAL LOW (ref 0.76–1.27)
GFR calc Af Amer: 126 mL/min/{1.73_m2} (ref >59)
GFR calc non Af Amer: 109 mL/min/{1.73_m2} (ref >59)
GLOBULIN, TOTAL: 2.4 g/dL (ref 1.5–4.5)
Glucose: 127 mg/dL — ABNORMAL HIGH (ref 65–99)
POTASSIUM: 3.8 mmol/L (ref 3.5–5.2)
SODIUM: 135 mmol/L (ref 134–144)
TOTAL PROTEIN: 6.6 g/dL (ref 6.0–8.5)

## 2017-10-26 LAB — CBC WITH DIFFERENTIAL/PLATELET
Basophils Absolute: 0 10*3/uL (ref 0.0–0.2)
Basos: 0 %
EOS (ABSOLUTE): 0.1 10*3/uL (ref 0.0–0.4)
EOS: 1 %
HEMATOCRIT: 41.7 % (ref 37.5–51.0)
HEMOGLOBIN: 14.3 g/dL (ref 13.0–17.7)
IMMATURE GRANS (ABS): 0 10*3/uL (ref 0.0–0.1)
IMMATURE GRANULOCYTES: 1 %
LYMPHS: 25 %
Lymphocytes Absolute: 1.7 10*3/uL (ref 0.7–3.1)
MCH: 30.4 pg (ref 26.6–33.0)
MCHC: 34.3 g/dL (ref 31.5–35.7)
MCV: 89 fL (ref 79–97)
MONOCYTES: 6 %
MONOS ABS: 0.4 10*3/uL (ref 0.1–0.9)
NEUTROS PCT: 67 %
Neutrophils Absolute: 4.6 10*3/uL (ref 1.4–7.0)
Platelets: 214 10*3/uL (ref 150–450)
RBC: 4.7 x10E6/uL (ref 4.14–5.80)
RDW: 12.5 % (ref 12.3–15.4)
WBC: 6.9 10*3/uL (ref 3.4–10.8)

## 2017-10-31 DIAGNOSIS — J449 Chronic obstructive pulmonary disease, unspecified: Secondary | ICD-10-CM | POA: Diagnosis not present

## 2017-10-31 DIAGNOSIS — R110 Nausea: Secondary | ICD-10-CM | POA: Diagnosis not present

## 2017-10-31 DIAGNOSIS — R1111 Vomiting without nausea: Secondary | ICD-10-CM | POA: Diagnosis not present

## 2017-10-31 DIAGNOSIS — R109 Unspecified abdominal pain: Secondary | ICD-10-CM | POA: Diagnosis not present

## 2017-10-31 DIAGNOSIS — Z8673 Personal history of transient ischemic attack (TIA), and cerebral infarction without residual deficits: Secondary | ICD-10-CM | POA: Diagnosis not present

## 2017-10-31 DIAGNOSIS — R0902 Hypoxemia: Secondary | ICD-10-CM | POA: Diagnosis not present

## 2017-10-31 DIAGNOSIS — I499 Cardiac arrhythmia, unspecified: Secondary | ICD-10-CM | POA: Diagnosis not present

## 2017-10-31 DIAGNOSIS — Z7982 Long term (current) use of aspirin: Secondary | ICD-10-CM | POA: Diagnosis not present

## 2017-10-31 DIAGNOSIS — K5900 Constipation, unspecified: Secondary | ICD-10-CM | POA: Diagnosis not present

## 2017-10-31 DIAGNOSIS — E785 Hyperlipidemia, unspecified: Secondary | ICD-10-CM | POA: Diagnosis not present

## 2017-10-31 DIAGNOSIS — F17200 Nicotine dependence, unspecified, uncomplicated: Secondary | ICD-10-CM | POA: Diagnosis not present

## 2017-10-31 DIAGNOSIS — I10 Essential (primary) hypertension: Secondary | ICD-10-CM | POA: Diagnosis not present

## 2017-10-31 DIAGNOSIS — Z79899 Other long term (current) drug therapy: Secondary | ICD-10-CM | POA: Diagnosis not present

## 2017-10-31 DIAGNOSIS — Z7951 Long term (current) use of inhaled steroids: Secondary | ICD-10-CM | POA: Diagnosis not present

## 2017-10-31 DIAGNOSIS — R1084 Generalized abdominal pain: Secondary | ICD-10-CM | POA: Diagnosis not present

## 2017-11-01 DIAGNOSIS — R109 Unspecified abdominal pain: Secondary | ICD-10-CM | POA: Diagnosis not present

## 2017-11-06 ENCOUNTER — Other Ambulatory Visit: Payer: Self-pay | Admitting: Family

## 2017-11-06 DIAGNOSIS — M5432 Sciatica, left side: Secondary | ICD-10-CM | POA: Diagnosis not present

## 2017-11-06 DIAGNOSIS — I10 Essential (primary) hypertension: Secondary | ICD-10-CM

## 2017-11-09 ENCOUNTER — Emergency Department (HOSPITAL_COMMUNITY): Payer: Medicare Other

## 2017-11-09 ENCOUNTER — Encounter (HOSPITAL_COMMUNITY): Payer: Self-pay | Admitting: Emergency Medicine

## 2017-11-09 ENCOUNTER — Other Ambulatory Visit: Payer: Self-pay

## 2017-11-09 ENCOUNTER — Emergency Department (HOSPITAL_COMMUNITY)
Admission: EM | Admit: 2017-11-09 | Discharge: 2017-11-10 | Disposition: A | Discharge status: Home / Self Care | Payer: Medicare Other | Attending: Emergency Medicine | Admitting: Emergency Medicine

## 2017-11-09 DIAGNOSIS — M25852 Other specified joint disorders, left hip: Secondary | ICD-10-CM | POA: Diagnosis not present

## 2017-11-09 DIAGNOSIS — M25851 Other specified joint disorders, right hip: Secondary | ICD-10-CM | POA: Diagnosis not present

## 2017-11-09 DIAGNOSIS — Z79899 Other long term (current) drug therapy: Secondary | ICD-10-CM | POA: Insufficient documentation

## 2017-11-09 DIAGNOSIS — Z8673 Personal history of transient ischemic attack (TIA), and cerebral infarction without residual deficits: Secondary | ICD-10-CM | POA: Insufficient documentation

## 2017-11-09 DIAGNOSIS — E785 Hyperlipidemia, unspecified: Secondary | ICD-10-CM | POA: Diagnosis not present

## 2017-11-09 DIAGNOSIS — M5126 Other intervertebral disc displacement, lumbar region: Secondary | ICD-10-CM | POA: Diagnosis not present

## 2017-11-09 DIAGNOSIS — J449 Chronic obstructive pulmonary disease, unspecified: Secondary | ICD-10-CM | POA: Diagnosis not present

## 2017-11-09 DIAGNOSIS — F17210 Nicotine dependence, cigarettes, uncomplicated: Secondary | ICD-10-CM | POA: Diagnosis not present

## 2017-11-09 DIAGNOSIS — M5136 Other intervertebral disc degeneration, lumbar region: Secondary | ICD-10-CM | POA: Diagnosis not present

## 2017-11-09 DIAGNOSIS — I10 Essential (primary) hypertension: Secondary | ICD-10-CM | POA: Diagnosis not present

## 2017-11-09 DIAGNOSIS — M25552 Pain in left hip: Secondary | ICD-10-CM | POA: Diagnosis not present

## 2017-11-09 MED ORDER — KETOROLAC TROMETHAMINE 10 MG PO TABS
10.0000 mg | ORAL_TABLET | Freq: Once | ORAL | Status: AC
  Administered 2017-11-09: 10 mg via ORAL
  Filled 2017-11-09: qty 1

## 2017-11-09 MED ORDER — CYCLOBENZAPRINE HCL 10 MG PO TABS
10.0000 mg | ORAL_TABLET | Freq: Once | ORAL | Status: AC
  Administered 2017-11-09: 10 mg via ORAL
  Filled 2017-11-09: qty 1

## 2017-11-09 MED ORDER — TRAMADOL HCL 50 MG PO TABS
100.0000 mg | ORAL_TABLET | Freq: Once | ORAL | Status: AC
  Administered 2017-11-09: 100 mg via ORAL
  Filled 2017-11-09: qty 2

## 2017-11-09 MED ORDER — ONDANSETRON HCL 4 MG PO TABS
4.0000 mg | ORAL_TABLET | Freq: Once | ORAL | Status: AC
  Administered 2017-11-09: 4 mg via ORAL
  Filled 2017-11-09: qty 1

## 2017-11-09 NOTE — ED Triage Notes (Signed)
Pt c/o left hip pain radiating down leg x 3 days. Denies lower back pain. Nad. Denies injury

## 2017-11-09 NOTE — ED Provider Notes (Signed)
[facility] EMERGENCY DEPARTMENT Provider Note   CSN: [id number] Arrival date & time: 11/09/17  2213     History   Chief Complaint Chief Complaint  Patient presents with  . Leg Pain    HPI Matthew [name] is a 57 y.o. male.  Patient is a 58 year old male who presents to the emergency department with complaint of left hip pain.  The patient states that this is been going off and on for quite some time.  He was seen recently by his primary physician office.  He was told that he had sciatica.  The patient states that he has left hip pain that goes from his hip all the way down to his ankle.  He has not had any recent fall or injury.  He has not had any recent operation or procedure.  It is of note that he has a history of arthritis.  He presents now for assistance with his pain, as he states that the Flexeril that he had been given in the office is not working.  He says he is not able to sleep.  And he has pain when he attempts to walk particularly at his hip.  He says he is not having a lot of back pain, but does have some back pain when he stands or walks.  There is been no loss of bowel or bladder function.  No repeated falls.  The history is provided by the patient.    Past Medical History:  Diagnosis Date  . Abscess   . Anxiety   . Aphthous ulcer   . Arthritis   . Asthma   . Chronic bronchitis   . Chronic pain   . COPD (chronic obstructive pulmonary disease) ([place])   . Depression   . Hyperlipidemia   . Hypertension   . Trigeminal neuralgia   . Vertigo     Patient Active Problem List   Diagnosis Date Noted  . Late effect of cerebrovascular accident (CVA) 10/25/2017  . Acute CVA (cerebrovascular accident) ([place]) 10/22/2017  . Tobacco use 10/22/2017  . Hyperglycemia 10/22/2017  . Depression 12/25/2016  . Abnormal drug screen 11/02/2016  . Pain management contract broken 03/09/2016  . Coronary artery calcification 01/18/2016  . Thoracic aortic atherosclerosis ([place])  01/18/2016  . Chronic back pain 08/03/2015  . Opioid dependence ([place]) 08/03/2015  . Obesity (BMI 30-39.9) 03/09/2015  . Smoker 02/02/2015  . Erectile dysfunction 08/24/2014  . Vitamin D deficiency 04/18/2014  . Hyperlipidemia 04/18/2014  . COPD (chronic obstructive pulmonary disease) ([place]) 04/17/2014  . Weakness 08/22/2012  . Chronic pain syndrome 08/19/2012  . Adjustment disorder with mixed anxiety and depressed mood 05/11/2012  . Trigeminal neuralgia 05/11/2012  . HTN (hypertension) 05/11/2012    Past Surgical History:  Procedure Laterality Date  . CHOLECYSTECTOMY    . HAND RECONSTRUCTION Right 1990's        Home Medications    Prior to Admission medications   Medication Sig Start Date End Date Taking? Authorizing Provider  albuterol (VENTOLIN HFA) 108 (90 Base) MCG/ACT inhaler INHALE 2 PUFFS INTO LUNGS EVERY 6 HOURS AS NEEDED FOR WHEEZE OR SHORTNESS OF BREATH Patient taking differently: Inhale 2 puffs into the lungs every 6 (six) hours as needed for shortness of breath.  09/24/17   Evelina Dun A, FNP  amLODipine (NORVASC) 10 MG tablet TAKE 1 TABLET BY MOUTH EVERY DAY 11/06/17   Evelina Dun A, FNP  aspirin 325 MG tablet Take 1 tablet (325 mg total) by mouth daily. 10/25/17  Kathie Dike, MD  atorvastatin (LIPITOR) 40 MG tablet Take 1 tablet (40 mg total) by mouth daily at 6 PM. 10/24/17   Kathie Dike, MD  carbamazepine (TEGRETOL) 200 MG tablet TAKE 2 TABLETS BY MOUTH 4 TIMES A DAY 10/26/17   Hawks, Christy A, FNP  fluticasone (FLONASE) 50 MCG/ACT nasal spray USE 2 SPRAYS INTO BOTH NOSTRILS ONCE DAILY Patient taking differently: Place 2 sprays into both nostrils daily.  10/18/17   Evelina Dun A, FNP  Fluticasone-Umeclidin-Vilant (TRELEGY ELLIPTA) 100-62.5-25 MCG/INH AEPB Inhale 1 puff into the lungs daily. 07/12/17   [name], FNP  gabapentin (NEURONTIN) 600 MG tablet Take 1 tablet in the morning, 1 tablet in the afternoon, and 1.5 tablet at bedtime Patient  taking differently: Take 600 mg by mouth 3 (three) times daily.  07/16/17   Narda Amber K, DO  hydrOXYzine (ATARAX/VISTARIL) 25 MG tablet Take 1 tablet (25 mg total) by mouth 3 (three) times daily as needed for anxiety. 10/24/17   Kathie Dike, MD  lisinopril-hydrochlorothiazide (ZESTORETIC) 20-12.5 MG tablet Take 2 tablets by mouth daily. 10/25/17   [name], FNP    Family History Family History  Problem Relation Age of Onset  . Hypertension Mother   . Diabetes Mother   . Hypertension Father   . Diabetes Father   . Cancer Father     Social History Social History   Tobacco Use  . Smoking status: Current Every Day Smoker    Packs/day: 2.00    Years: 40.00    Pack years: 80.00    Types: Cigarettes    Start date: 07/23/1975  . Smokeless tobacco: Never Used  Substance Use Topics  . Alcohol use: Yes    Comment: 08/21/2012 "quit drinking ~ 1985"  . Drug use: No     Allergies   Penicillins   Review of Systems Review of Systems  Constitutional: Negative for activity change.       All ROS Neg except as noted in HPI  HENT: Negative for nosebleeds.   Eyes: Negative for photophobia and discharge.  Respiratory: Negative for cough, shortness of breath and wheezing.   Cardiovascular: Negative for chest pain and palpitations.  Gastrointestinal: Negative for abdominal pain and blood in stool.  Genitourinary: Negative for dysuria, frequency and hematuria.  Musculoskeletal: Positive for arthralgias and back pain. Negative for neck pain.  Skin: Negative.   Neurological: Negative for dizziness, seizures and speech difficulty.  Psychiatric/Behavioral: Negative for confusion and hallucinations.     Physical Exam Updated Vital Signs BP (!) 160/87 (BP Location: Right Arm)   Pulse 74   Temp 97.7 F (36.5 C) (Oral)   Resp 16   Ht 5\' 9"  (1.753 m)   Wt 110.2 kg   SpO2 98%   BMI 35.88 kg/m   Physical Exam  Constitutional: He is oriented to person, place, and time. He  appears well-developed and well-nourished.  Non-toxic appearance.  HENT:  Head: Normocephalic.  Right Ear: Tympanic membrane and external ear normal.  Left Ear: Tympanic membrane and external ear normal.  Eyes: Pupils are equal, round, and reactive to light. EOM and lids are normal.  Neck: Normal range of motion. Neck supple. Carotid bruit is not present.  Cardiovascular: Normal rate, regular rhythm, normal heart sounds, intact distal pulses and normal pulses.  Pulmonary/Chest: Breath sounds normal. No respiratory distress.  Abdominal: Soft. Bowel sounds are normal. There is no tenderness. There is no guarding.  Musculoskeletal: Normal range of motion.  Left hip: He exhibits tenderness and crepitus. He exhibits no swelling and no deformity.       Legs: There is pain with attempted range of motion of the left hip.  There is pain to palpation of the anterior lateral portion of the hip.  There is some crepitus with attempted range of motion.  There is crepitus with range of motion of the left knee.  There is full range of motion of the left ankle and toes.  Capillary refill is less than 2 seconds.  Dorsalis pedis pulse is 2+.  There is pain to palpation at the sciatic notch.  There is no pain to the lower lumbar area.  No hot areas appreciated.  Lymphadenopathy:       Head (right side): No submandibular adenopathy present.       Head (left side): No submandibular adenopathy present.    He has no cervical adenopathy.  Neurological: He is alert and oriented to person, place, and time. He has normal strength. No cranial nerve deficit or sensory deficit.  Skin: Skin is warm and dry.  Psychiatric: He has a normal mood and affect. His speech is normal.  Nursing note and vitals reviewed.    ED Treatments / Results  Labs (all labs ordered are listed, but only abnormal results are displayed) Labs Reviewed - No data to display  EKG None  Radiology No results  found.  Procedures Procedures (including critical care time)  Medications Ordered in ED Medications - No data to display   Initial Impression / Assessment and Plan / ED Course  I have reviewed the triage vital signs and the nursing notes.  Pertinent labs & imaging results that were available during my care of the patient were reviewed by me and considered in my medical decision making (see chart for details).       Final Clinical Impressions(s) / ED Diagnoses MDM  Vital signs reviewed.  Pulse oximetry is 98% on room air.  Within normal limits by my interpretation.  No gross neurologic deficit appreciated on examination.  The patient has pain to palpation of the left hip.  He has pain with attempted range of motion of the left hip, and he has pain in the sciatic notch area.  The x-ray reveals a femoral acetabular impingement of both hips.  It also reveals degenerative disc disease at the L4-L5, and L5-S1 areas.  I have discussed these findings with the patient in terms of which she understands.  I have given the patient a referral to Dr. Aline Brochure for orthopedic evaluation.  I have added Decadron and Ultram to the patient's current regiment.  I discussed with him that the Ultram and the muscle relaxer could cause drowsiness and to use these medicines with caution.  Patient states that he has an appointment with his primary doctor on Tuesday, and he will try to make arrangements to see the orthopedic specialist as well.   Final diagnoses:  Femoroacetabular impingement of both hips  DDD (degenerative disc disease), lumbar    ED Discharge Orders         Ordered    traMADol (ULTRAM) 50 MG tablet     11/10/17 0103    dexamethasone (DECADRON) 4 MG tablet  2 times daily with meals     11/10/17 0103    cyclobenzaprine (FLEXERIL) 10 MG tablet  3 times daily     11/10/17 0103           Lily Kocher, PA-C 11/10/17 0111  Isla Pence, MD 11/10/17 1635

## 2017-11-10 DIAGNOSIS — M5136 Other intervertebral disc degeneration, lumbar region: Secondary | ICD-10-CM | POA: Diagnosis not present

## 2017-11-10 MED ORDER — CYCLOBENZAPRINE HCL 10 MG PO TABS: 10.0000 mg | ORAL_TABLET | Freq: Three times a day (TID) | ORAL | 0 refills | Status: DC

## 2017-11-10 MED ORDER — FAMOTIDINE 20 MG PO TABS
20.0000 mg | ORAL_TABLET | Freq: Once | ORAL | Status: AC
  Administered 2017-11-10: 20 mg via ORAL
  Filled 2017-11-10: qty 1

## 2017-11-10 MED ORDER — DEXAMETHASONE 4 MG PO TABS: 4.0000 mg | ORAL_TABLET | Freq: Two times a day (BID) | ORAL | 0 refills | Status: DC

## 2017-11-10 MED ORDER — ALUM & MAG HYDROXIDE-SIMETH 200-200-20 MG/5ML PO SUSP
15.0000 mL | Freq: Once | ORAL | Status: AC
  Administered 2017-11-10: 15 mL via ORAL
  Filled 2017-11-10: qty 30

## 2017-11-10 MED ORDER — TRAMADOL HCL 50 MG PO TABS: ORAL_TABLET | ORAL | 0 refills | Status: DC

## 2017-11-10 NOTE — Discharge Instructions (Signed)
Your xray reveals an extra bone at the left hip joint. This causes bone-on-bone rubbing and repeated hip pain.You actually have this condition in both hips.  The x-ray of your lower back shows degenerative disc disease at the L4-L5 area as well as the L5-S1 area.  Please discuss this with Matthew [name].  Use a heating pad to your back and hip area.  Continue your Flexeril.  Please add Ultram to your current medications.  Flexeril and Ultram may cause drowsiness.  Please use these medications with caution.

## 2017-11-13 ENCOUNTER — Encounter: Payer: Self-pay | Admitting: Family

## 2017-11-13 ENCOUNTER — Ambulatory Visit (INDEPENDENT_AMBULATORY_CARE_PROVIDER_SITE_OTHER): Payer: Medicare Other | Admitting: Family

## 2017-11-13 VITALS — BP 120/82 | HR 78 | Temp 97.4°F | Ht 69.0 in | Wt 233.4 lb

## 2017-11-13 DIAGNOSIS — M545 Low back pain, unspecified: Secondary | ICD-10-CM

## 2017-11-13 DIAGNOSIS — I6930 Unspecified sequelae of cerebral infarction: Secondary | ICD-10-CM | POA: Diagnosis not present

## 2017-11-13 DIAGNOSIS — Z09 Encounter for follow-up examination after completed treatment for conditions other than malignant neoplasm: Secondary | ICD-10-CM | POA: Diagnosis not present

## 2017-11-13 DIAGNOSIS — M25852 Other specified joint disorders, left hip: Secondary | ICD-10-CM

## 2017-11-13 DIAGNOSIS — M25851 Other specified joint disorders, right hip: Secondary | ICD-10-CM | POA: Diagnosis not present

## 2017-11-13 DIAGNOSIS — M5136 Other intervertebral disc degeneration, lumbar region: Secondary | ICD-10-CM

## 2017-11-13 DIAGNOSIS — G8929 Other chronic pain: Secondary | ICD-10-CM | POA: Diagnosis not present

## 2017-11-13 DIAGNOSIS — M51369 Other intervertebral disc degeneration, lumbar region without mention of lumbar back pain or lower extremity pain: Secondary | ICD-10-CM

## 2017-11-13 NOTE — Patient Instructions (Signed)

## 2017-11-13 NOTE — Progress Notes (Signed)
   Subjective:    Patient ID: Matthew [name], male    DOB: 06-23-1960, 57 y.o.   MRN: [id number]  Chief Complaint  Patient presents with  . needs referral to orthopedics    with Arther Abbott MD  . Follow-up    Hip Pain   The incident occurred more than 1 week ago. There was no injury mechanism. The pain is present in the left hip and right hip. The quality of the pain is described as shooting. The pain is at a severity of 10/10. The pain is moderate. The pain has been intermittent since onset. Associated symptoms include muscle weakness. Pertinent negatives include no numbness. He reports no foreign bodies present. The symptoms are aggravated by movement. He has tried rest and non-weight bearing for the symptoms. The treatment provided mild relief.    *hospital notes, labs, and x-rays were reviewed.   Review of Systems  Neurological: Negative for numbness.  All other systems reviewed and are negative.      Objective:   Physical Exam  Constitutional: He is oriented to person, place, and time. He appears well-developed and well-nourished. No distress.  HENT:  Head: Normocephalic.  Eyes: Pupils are equal, round, and reactive to light. Right eye exhibits no discharge. Left eye exhibits no discharge.  Neck: Normal range of motion. Neck supple. No thyromegaly present.  Cardiovascular: Normal rate, regular rhythm, normal heart sounds and intact distal pulses.  No murmur heard. Pulmonary/Chest: Effort normal. No respiratory distress. He has decreased breath sounds. He has no wheezes.  Abdominal: Soft. Bowel sounds are normal. He exhibits no distension. There is no tenderness.  Musculoskeletal: He exhibits no edema or tenderness.  Full ROM of bilateral hips with internal and external rotation, pain in left hip that radiates down left leg with walking  Neurological: He is alert and oriented to person, place, and time. He has normal reflexes. No cranial nerve deficit.  Skin: Skin is warm  and dry. No rash noted. No erythema.     Psychiatric: He has a normal mood and affect. His behavior is normal. Judgment and thought content normal.  Vitals reviewed.     BP 120/82   Pulse 78   Temp (!) 97.4 F (36.3 C) (Oral)   Ht 5\' 9"  (1.753 m)   Wt 233 lb 6.4 oz (105.9 kg)   BMI 34.47 kg/m      Assessment & Plan:  Elizjah Noblet Wisby comes in today with chief complaint of needs referral to orthopedics (with Arther Abbott MD) and Follow-up   Diagnosis and orders addressed:  1. Femoroacetabular impingement of both hips - Ambulatory referral to Orthopedic Surgery  2. DDD (degenerative disc disease), lumbar - Ambulatory referral to Orthopedic Surgery  3. Chronic bilateral low back pain without sciatica - Ambulatory referral to Orthopedic Surgery  4. Hospital discharge follow-up   Continue decadron and ultram   Follow up plan: Keep chronic follow up   Evelina Dun, FNP

## 2017-11-16 ENCOUNTER — Ambulatory Visit: Payer: Medicare Other | Admitting: Family

## 2017-11-16 ENCOUNTER — Ambulatory Visit: Payer: Medicare Other | Admitting: *Deleted

## 2017-11-16 VITALS — BP 119/82 | HR 51

## 2017-11-16 DIAGNOSIS — I10 Essential (primary) hypertension: Secondary | ICD-10-CM

## 2017-11-16 NOTE — Progress Notes (Signed)
Pt came by for BP check per [facility] = is normal

## 2017-11-22 ENCOUNTER — Other Ambulatory Visit: Payer: Self-pay | Admitting: Family

## 2017-11-22 ENCOUNTER — Telehealth: Payer: Self-pay | Admitting: *Deleted

## 2017-11-22 MED ORDER — DEXAMETHASONE 4 MG PO TABS: 4.0000 mg | ORAL_TABLET | Freq: Two times a day (BID) | ORAL | 0 refills | Status: DC

## 2017-11-22 NOTE — Telephone Encounter (Signed)
According to our records refills were sent on both of patient's blood pressure medications in September 2019.  Patient states he is not sure what the medication he needs refilled is for, and is unable read the bottle to let me know.  He states he went to the pharmacy today with the medication bottle and was told they would send a refill request.  No refill request received from pharmacy.  Per DPR- spoke with patient's son [name], and advised him to check with his dad and let us know if there is a refill needed, and we will be glad to send.  Patient's son agreeable.

## 2017-11-22 NOTE — Addendum Note (Signed)
Addended by: Evelina Dun A on: 11/22/2017 06:33 PM   Modules accepted: Orders

## 2017-11-22 NOTE — Telephone Encounter (Signed)
Prescription sent to pharmacy.

## 2017-11-22 NOTE — Telephone Encounter (Signed)
What is the name of the medication? Blood pressure medication. Patient has enough for in the morning  Have you contacted your pharmacy to request a refill? YES  Which pharmacy would you like this sent to? [facility] in [place]   Patient notified that their request is being sent to the clinical staff for review and that they should receive a call once it is complete. If they do not receive a call within 24 hours they can check with their pharmacy or our office.

## 2017-11-22 NOTE — Telephone Encounter (Signed)
Requesting dexamethasone 4 mg. Please advise.

## 2017-12-02 ENCOUNTER — Other Ambulatory Visit: Payer: Self-pay | Admitting: Family

## 2017-12-03 ENCOUNTER — Other Ambulatory Visit: Payer: Self-pay | Admitting: Family

## 2017-12-03 DIAGNOSIS — J441 Chronic obstructive pulmonary disease with (acute) exacerbation: Secondary | ICD-10-CM

## 2017-12-03 NOTE — Telephone Encounter (Signed)
Last seen 11/13/17

## 2017-12-05 NOTE — Telephone Encounter (Signed)
Transitional care started but incomplete

## 2017-12-06 ENCOUNTER — Telehealth: Payer: Self-pay | Admitting: Family

## 2017-12-06 NOTE — Telephone Encounter (Signed)
Left a message with referral appointments.

## 2017-12-12 ENCOUNTER — Institutional Professional Consult (permissible substitution): Payer: Medicare Other | Admitting: Pulmonary Disease

## 2017-12-12 ENCOUNTER — Other Ambulatory Visit: Payer: Self-pay | Admitting: Family

## 2017-12-13 NOTE — Telephone Encounter (Signed)
Last seen 11/13/17

## 2017-12-19 ENCOUNTER — Encounter: Payer: Self-pay | Admitting: Orthopedic Surgery

## 2017-12-19 ENCOUNTER — Ambulatory Visit (INDEPENDENT_AMBULATORY_CARE_PROVIDER_SITE_OTHER): Payer: Medicare Other | Admitting: Orthopedic Surgery

## 2017-12-19 VITALS — BP 141/81 | HR 56 | Ht 69.0 in | Wt 238.0 lb

## 2017-12-19 DIAGNOSIS — M5136 Other intervertebral disc degeneration, lumbar region: Secondary | ICD-10-CM | POA: Diagnosis not present

## 2017-12-19 DIAGNOSIS — M1612 Unilateral primary osteoarthritis, left hip: Secondary | ICD-10-CM

## 2017-12-19 DIAGNOSIS — I6381 Other cerebral infarction due to occlusion or stenosis of small artery: Secondary | ICD-10-CM | POA: Diagnosis not present

## 2017-12-19 NOTE — Patient Instructions (Signed)
[phone] is the number to call and make appointment with physical therapy in [place].

## 2017-12-19 NOTE — Progress Notes (Signed)
NEW PATIENT OFFICE VISIT  Chief Complaint  Patient presents with  . Hip Pain    left lateral hip pain x 6 weeks also has some right hip pain at times (since CVA 6 weeks ago)    57 had a stroke 6 weeks ago complains of left sided leg hip pain and some lower back pain.  At the time he was having severe pain which prevented him from having normal weightbearing without a walker.  He had a dull aching pain radiating sharp at times associated with some numbness and tingling.  He is now asymptomatic except for some mild lateral hip pain   Review of Systems  Constitutional: Negative for chills and fever.  Musculoskeletal: Positive for back pain and joint pain.  Neurological: Positive for tingling and focal weakness.     Past Medical History:  Diagnosis Date  . Abscess   . Anxiety   . Aphthous ulcer   . Arthritis   . Asthma   . Chronic bronchitis   . Chronic pain   . COPD (chronic obstructive pulmonary disease) ([place])   . CVA (cerebral vascular accident) ([place])   . Depression   . Hyperlipidemia   . Hypertension   . Trigeminal neuralgia   . Vertigo     Past Surgical History:  Procedure Laterality Date  . CHOLECYSTECTOMY    . HAND RECONSTRUCTION Right 1990's    Family History  Problem Relation Age of Onset  . Hypertension Mother   . Diabetes Mother   . Hypertension Father   . Diabetes Father   . Cancer Father   . Emphysema Father    Social History   Tobacco Use  . Smoking status: Current Every Day Smoker    Packs/day: 2.00    Years: 40.00    Pack years: 80.00    Types: Cigarettes    Start date: 07/23/1975  . Smokeless tobacco: Never Used  Substance Use Topics  . Alcohol use: Yes    Comment: 08/21/2012 "quit drinking ~ 1985"  . Drug use: No    Allergies  Allergen Reactions  . Penicillins     Current Meds  Medication Sig  . albuterol (VENTOLIN HFA) 108 (90 Base) MCG/ACT inhaler INHALE 2 PUFFS INTO LUNGS EVERY 6 HOURS AS NEEDED FOR WHEEZE OR SHORTNESS OF  BREATH (Patient taking differently: Inhale 2 puffs into the lungs every 6 (six) hours as needed for shortness of breath. )  . amLODipine (NORVASC) 10 MG tablet TAKE 1 TABLET BY MOUTH EVERY DAY  . aspirin 325 MG tablet Take 1 tablet (325 mg total) by mouth daily.  Marland Kitchen atorvastatin (LIPITOR) 40 MG tablet Take 1 tablet (40 mg total) by mouth daily at 6 PM.  . carbamazepine (TEGRETOL) 200 MG tablet TAKE 2 TABLETS BY MOUTH 4 TIMES A DAY  . dexamethasone (DECADRON) 4 MG tablet TAKE 1 TABLET (4 MG TOTAL) BY MOUTH 2 (TWO) TIMES DAILY WITH A MEAL.  . fluticasone (FLONASE) 50 MCG/ACT nasal spray USE 2 SPRAYS INTO BOTH NOSTRILS ONCE DAILY (Patient taking differently: Place 2 sprays into both nostrils daily. )  . gabapentin (NEURONTIN) 600 MG tablet Take 1 tablet in the morning, 1 tablet in the afternoon, and 1.5 tablet at bedtime (Patient taking differently: Take 600 mg by mouth 3 (three) times daily. )  . hydrOXYzine (ATARAX/VISTARIL) 25 MG tablet Take 1 tablet (25 mg total) by mouth 3 (three) times daily as needed for anxiety.  Marland Kitchen lisinopril-hydrochlorothiazide (ZESTORETIC) 20-12.5 MG tablet Take 2 tablets by mouth  daily.  . TRELEGY ELLIPTA 100-62.5-25 MCG/INH AEPB TAKE 1 PUFF BY MOUTH EVERY DAY    BP (!) 141/81   Pulse (!) 56   Ht 5\' 9"  (1.753 m)   Wt 238 lb (108 kg)   BMI 35.15 kg/m   Physical Exam  Constitutional: He is oriented to person, place, and time. He appears well-developed and well-nourished.  Neurological: He is alert and oriented to person, place, and time. Gait normal.  Psychiatric: He has a normal mood and affect. His behavior is normal. Judgment and thought content normal.    Right Hip Exam  Right hip exam is normal.   Tenderness  The patient is experiencing no tenderness.   Range of Motion  The patient has normal right hip ROM.  Muscle Strength  The patient has normal right hip strength.  Tests  FABER: negative  Other  Erythema: absent Sensation: normal Pulse:  present  Comments:  HIP STABILITY NORMAL    Left Hip Exam  Left hip exam is normal.  Tenderness  The patient is experiencing no tenderness.   Range of Motion  The patient has normal left hip ROM.  Muscle Strength  The patient has normal left hip strength.   Tests  FABER: negative  Other  Erythema: absent Sensation: normal Pulse: present  Comments:  HIP STABILITY NORMAL    Back Exam   Tenderness  The patient is experiencing no tenderness.   Reflexes  Patellar: normal Achilles: normal Babinski's sign: normal   Other  Erythema: no back redness Scars: absent        MEDICAL DECISION SECTION  Xrays were done at [facility].  The first x-ray complete lumbar spine.  Small amount of disc space narrowing at L2-3 L3-4 more extensive at L5-S1 facet arthritis noted at this level no evidence of spondylolysis or listhesis  Impression degenerative disc disease greatest at L5-S1  Pelvis and left hip 3 total views mild head neck offset abnormality consistent with femoral acetabular impingement type changes without joint space narrowing  Impression subtle changes of the femoral heads consistent with femoral acetabular impingement without arthritic narrowing   Encounter Diagnoses  Name Primary?  . DDD (degenerative disc disease), lumbar Yes  . Primary osteoarthritis of left hip     PLAN: (Rx., injectx, surgery, frx, mri/ct) Patient currently asymptomatic recommend physical therapy follow-up with [place] as needed  No orders of the defined types were placed in this encounter.   Arther Abbott, MD  12/19/2017 10:49 AM

## 2017-12-25 ENCOUNTER — Ambulatory Visit: Payer: Medicare Other | Attending: Orthopedic Surgery | Admitting: Physical Therapy

## 2017-12-25 ENCOUNTER — Encounter: Payer: Self-pay | Admitting: Physical Therapy

## 2017-12-25 DIAGNOSIS — M25652 Stiffness of left hip, not elsewhere classified: Secondary | ICD-10-CM | POA: Diagnosis not present

## 2017-12-25 DIAGNOSIS — M545 Low back pain, unspecified: Secondary | ICD-10-CM

## 2017-12-25 DIAGNOSIS — M6281 Muscle weakness (generalized): Secondary | ICD-10-CM | POA: Diagnosis not present

## 2017-12-25 DIAGNOSIS — R262 Difficulty in walking, not elsewhere classified: Secondary | ICD-10-CM | POA: Insufficient documentation

## 2017-12-25 DIAGNOSIS — M25552 Pain in left hip: Secondary | ICD-10-CM | POA: Diagnosis not present

## 2017-12-25 NOTE — Therapy (Addendum)
[facility], [place], [address] Phone: [phone]   Fax:  [phone]  Physical Therapy Evaluation  Patient Details  Name: Matthew [name] MRN: [id number] Date of Birth: 10/09/1960 Referring Provider (PT): Arther Abbott, MD   Encounter Date: 12/25/2017  PT End of Session - 12/25/17 2138    Visit Number  1    Number of Visits  18    Date for PT Re-Evaluation  02/12/18    Authorization Type  Progress note every 10th visit; KX modifier at 15th visit    PT Start Time  1300    PT Stop Time  1340    PT Time Calculation (min)  40 min    Activity Tolerance  Patient tolerated treatment well    Behavior During Therapy  [facility] for tasks assessed/performed       Past Medical History:  Diagnosis Date  . Abscess   . Anxiety   . Aphthous ulcer   . Arthritis   . Asthma   . Chronic bronchitis   . Chronic pain   . COPD (chronic obstructive pulmonary disease) ([place])   . CVA (cerebral vascular accident) ([place])   . Depression   . Hyperlipidemia   . Hypertension   . Trigeminal neuralgia   . Vertigo     Past Surgical History:  Procedure Laterality Date  . CHOLECYSTECTOMY    . HAND RECONSTRUCTION Right 1990's    There were no vitals filed for this visit.   Subjective: Patient arrives to physical therapy with reports of left hip pain that has gotten progressively worse in the past couple of years. Patient reports having a stroke earlier this year and states pain may be attributed to the stroke. Patient states he has difficulties with his ADLs but performs all of them with increased time to perfrom them. Patient reports pain at worst is 9/10 and pain at best is 0/10 with pain medication. Patient's goals are to decrease pain, improve movement and improve ability to perform home activities.    [facility] PT Assessment - 12/25/17 0001      Assessment   Medical Diagnosis  lumbar DDD; primary osteoarthritis of L hip    Referring  Provider (PT)  Arther Abbott, MD    Onset Date/Surgical Date  --   ongoing   Next MD Visit  December, 2019    Prior Therapy  no      ROM / Strength   AROM / PROM / Strength  AROM;Strength      AROM   AROM Assessment Site  Lumbar;Hip    Right/Left Hip  Left    Left Hip Flexion  100    Left Hip ABduction  30    Lumbar Flexion  finger tips to shoe    Lumbar Extension  10 degrees    Lumbar - Right Side Bend  22" finger tip to floor    Lumbar - Left Side Bend  23" finger tip to floor      Strength   Strength Assessment Site  Hip;Knee    Right/Left Hip  Left    Left Hip Flexion  4-/5    Left Hip Extension  3+/5    Left Hip ABduction  3+/5    Right/Left Knee  Left    Left Knee Flexion  4/5    Left Knee Extension  4/5      Palpation   Palpation comment  no marked tenderness to palpation when assessing hip or lumbar spine  Special Tests    Special Tests  Hip Special Tests    Other special tests  (+) FADIR test with repoduction of pain    Hip Special Tests   Saralyn Pilar (FABER) Test;Hip Scouring      Saralyn Pilar [facility]) Test   Findings  Positive    Side  Left    Comments  Reproduction of pain      Hip Scouring   Findings  Positive    Side  Left    Comments  Repoduction of pain      Ambulation/Gait   Gait Pattern  Step-through pattern;Decreased step length - right;Decreased stance time - left;Decreased hip/knee flexion - left;Decreased weight shift to left;Antalgic;Lateral trunk lean to right;Wide base of support                Objective measurements completed on examination: See above findings.                   PT Long Term Goals - 12/25/17 2153      PT LONG TERM GOAL #1   Title  Patient will be independent with HEP    Time  6    Period  Weeks    Status  New      PT LONG TERM GOAL #2   Title  Patient will demonstrate 4+/5 or greater hip and knee MMT to improve stability during functional tasks.     Time  6    Period  Weeks    Status   New      PT LONG TERM GOAL #3   Title  Patient will report ability to perform ADLs and home activities with left hip pain less than 4/10.    Time  6    Period  Weeks    Status  New             Plan - 12/25/17 2144    Clinical Impression Statement  Patient is a 57 year old male who presents to physical therapy with left hip pain with intermittent radiating symptoms down to left foot, decreased lumbar AROM and hip AROM. Patient noted with good minus to good hip and knee MMT. Patient denies tenderness to palpation to left hip or lumbar spine. Patient (+) FABER, (+) hip scour and (+) FADIR tests with reproduction of pain. Patient ambulates with an antalgic gait pattern, with decreased stance time on left, increased right trunk lean, and wide base of support. Patient would benefit from skilled physical therapy to address deficits and address patient's goals.     History and Personal Factors relevant to plan of care:  CVA 10/22/17, HTN, COPD    Clinical Presentation  Evolving    Clinical Presentation due to:  not improving    Clinical Decision Making  Moderate    Rehab Potential  Good    PT Frequency  3x / week    PT Duration  6 weeks    PT Treatment/Interventions  ADLs/Self Care Home Management;Neuromuscular re-education;Electrical Stimulation;Cryotherapy;Moist Heat;Gait training;Stair training;Iontophoresis 4mg /ml Dexamethasone;Therapeutic activities;Ultrasound;Therapeutic exercise;Balance training;Passive range of motion;Manual techniques;Patient/family education    Consulted and Agree with Plan of Care  Patient       Patient will benefit from skilled therapeutic intervention in order to improve the following deficits and impairments:  Pain, Postural dysfunction, Decreased strength, Decreased range of motion, Difficulty walking  Visit Diagnosis: Pain in left hip - Plan: PT plan of care cert/re-cert  Muscle weakness (generalized) - Plan: PT plan of care cert/re-cert  Stiffness of left  hip, not elsewhere classified - Plan: PT plan of care cert/re-cert  Low back pain, unspecified back pain laterality, unspecified chronicity, unspecified whether sciatica present - Plan: PT plan of care cert/re-cert     Problem List Patient Active Problem List   Diagnosis Date Noted  . DDD (degenerative disc disease), lumbar 11/13/2017  . Femoroacetabular impingement of both hips 11/13/2017  . Late effect of cerebrovascular accident (CVA) 10/25/2017  . Acute CVA (cerebrovascular accident) ([place]) 10/22/2017  . Tobacco use 10/22/2017  . Hyperglycemia 10/22/2017  . Depression 12/25/2016  . Abnormal drug screen 11/02/2016  . Pain management contract broken 03/09/2016  . Coronary artery calcification 01/18/2016  . Thoracic aortic atherosclerosis ([place]) 01/18/2016  . Chronic back pain 08/03/2015  . Opioid dependence ([place]) 08/03/2015  . Obesity (BMI 30-39.9) 03/09/2015  . Smoker 02/02/2015  . Erectile dysfunction 08/24/2014  . Vitamin D deficiency 04/18/2014  . Hyperlipidemia 04/18/2014  . COPD (chronic obstructive pulmonary disease) ([place]) 04/17/2014  . Weakness 08/22/2012  . Chronic pain syndrome 08/19/2012  . Adjustment disorder with mixed anxiety and depressed mood 05/11/2012  . Trigeminal neuralgia 05/11/2012  . HTN (hypertension) 05/11/2012    Gabriela Eves, PT, DPT 12/25/2017, 10:12 PM  [facility] [address] [place], [place], [address] Phone: [phone]   Fax:  [phone]  Name: KAZIMIR HARTNETT MRN: [id number] Date of Birth: [birth year]

## 2017-12-31 ENCOUNTER — Ambulatory Visit: Payer: Medicare Other | Admitting: Physical Therapy

## 2017-12-31 DIAGNOSIS — M6281 Muscle weakness (generalized): Secondary | ICD-10-CM | POA: Diagnosis not present

## 2017-12-31 DIAGNOSIS — M25552 Pain in left hip: Secondary | ICD-10-CM | POA: Diagnosis not present

## 2017-12-31 DIAGNOSIS — M25652 Stiffness of left hip, not elsewhere classified: Secondary | ICD-10-CM

## 2017-12-31 DIAGNOSIS — M545 Low back pain, unspecified: Secondary | ICD-10-CM

## 2017-12-31 DIAGNOSIS — R262 Difficulty in walking, not elsewhere classified: Secondary | ICD-10-CM | POA: Diagnosis not present

## 2017-12-31 NOTE — Therapy (Signed)
[facility] [place], [place], [address] Phone: [phone]   Fax:  [phone]  Physical Therapy Treatment  Patient Details  Name: Matthew [name] MRN: [id number] Date of Birth: Apr 09, 1960 Referring Provider (PT): Arther Abbott, MD   Encounter Date: 12/31/2017  PT End of Session - 12/31/17 1349    Visit Number  2    Number of Visits  18    Date for PT Re-Evaluation  02/12/18    Authorization Type  Progress note every 10th visit; KX modifier at 15th visit    PT Start Time  1345    PT Stop Time  1435    PT Time Calculation (min)  50 min    Activity Tolerance  Patient tolerated treatment well    Behavior During Therapy  [facility] for tasks assessed/performed       Past Medical History:  Diagnosis Date  . Abscess   . Anxiety   . Aphthous ulcer   . Arthritis   . Asthma   . Chronic bronchitis   . Chronic pain   . COPD (chronic obstructive pulmonary disease) ([name])   . CVA (cerebral vascular accident) ([place])   . Depression   . Hyperlipidemia   . Hypertension   . Trigeminal neuralgia   . Vertigo     Past Surgical History:  Procedure Laterality Date  . CHOLECYSTECTOMY    . HAND RECONSTRUCTION Right 1990's    There were no vitals filed for this visit.  Subjective Assessment - 12/31/17 1358    Subjective  Patient reports feeling "alright" and states no pain currently at rest    Patient Stated Goals  no pain    Currently in Pain?  No/denies         [facility] PT Assessment - 12/31/17 0001      Assessment   Medical Diagnosis  lumbar DDD; primary osteoarthritis of L hip    Referring Provider (PT)  Arther Abbott, MD    Next MD Visit  December, 2019    Prior Therapy  no                   [facility] Adult PT Treatment/Exercise - 12/31/17 0001      Exercises   Exercises  Knee/Hip;Lumbar      Lumbar Exercises: Stretches   Lower Trunk Rotation  2 reps;30 seconds      Lumbar Exercises: Supine   Ab Set  20  reps;5 seconds    Clam  20 reps    Bent Knee Raise  20 reps;2 seconds    Other Supine Lumbar Exercises  hip adduction squeeze 5" hold x20      Modalities   Modalities  Electrical Stimulation;Moist Heat      Moist Heat Therapy   Number Minutes Moist Heat  12 Minutes    Moist Heat Location  Lumbar Spine      Electrical Stimulation   Electrical Stimulation Location  left low back    Electrical Stimulation Action  premod    Electrical Stimulation Parameters  80-150 hz x12 min    Electrical Stimulation Goals  Pain                  PT Long Term Goals - 12/25/17 2153      PT LONG TERM GOAL #1   Title  Patient will be independent with HEP    Time  6    Period  Weeks    Status  New  PT LONG TERM GOAL #2   Title  Patient will demonstrate 4+/5 or greater hip and knee MMT to improve stability during functional tasks.     Time  6    Period  Weeks    Status  New      PT LONG TERM GOAL #3   Title  Patient will report ability to perform ADLs and home activities with left hip pain less than 4/10.    Time  6    Period  Weeks    Status  New            Plan - 12/31/17 1359    Clinical Impression Statement  Patient was able to tolerate treatment well with minimal reports of pain. Patient required intermittent cuing for proper form with exercises. Patient noted with increased pain in left hip during e-stim/moist heat while lying supine with LEs elevated. Patient was able to tolerate 12 minutes before requesting to stand and change positions. Normal response to modalities upon removal    Clinical Presentation  Evolving    Clinical Decision Making  Moderate    Rehab Potential  Good    PT Frequency  3x / week    PT Duration  6 weeks    PT Treatment/Interventions  ADLs/Self Care Home Management;Neuromuscular re-education;Electrical Stimulation;Cryotherapy;Moist Heat;Gait training;Stair training;Iontophoresis 4mg /ml Dexamethasone;Therapeutic activities;Ultrasound;Therapeutic  exercise;Balance training;Passive range of motion;Manual techniques;Patient/family education    PT Next Visit Plan  Nustep or bike, core strengthening, LE strengthening modalities PRN for pain relief.    Consulted and Agree with Plan of Care  Patient       Patient will benefit from skilled therapeutic intervention in order to improve the following deficits and impairments:  Pain, Postural dysfunction, Decreased strength, Decreased range of motion, Difficulty walking  Visit Diagnosis: Pain in left hip  Muscle weakness (generalized)  Stiffness of left hip, not elsewhere classified  Low back pain, unspecified back pain laterality, unspecified chronicity, unspecified whether sciatica present     Problem List Patient Active Problem List   Diagnosis Date Noted  . DDD (degenerative disc disease), lumbar 11/13/2017  . Femoroacetabular impingement of both hips 11/13/2017  . Late effect of cerebrovascular accident (CVA) 10/25/2017  . Acute CVA (cerebrovascular accident) ([place]) 10/22/2017  . Tobacco use 10/22/2017  . Hyperglycemia 10/22/2017  . Depression 12/25/2016  . Abnormal drug screen 11/02/2016  . Pain management contract broken 03/09/2016  . Coronary artery calcification 01/18/2016  . Thoracic aortic atherosclerosis ([place]) 01/18/2016  . Chronic back pain 08/03/2015  . Opioid dependence ([place]) 08/03/2015  . Obesity (BMI 30-39.9) 03/09/2015  . Smoker 02/02/2015  . Erectile dysfunction 08/24/2014  . Vitamin D deficiency 04/18/2014  . Hyperlipidemia 04/18/2014  . COPD (chronic obstructive pulmonary disease) ([place]) 04/17/2014  . Weakness 08/22/2012  . Chronic pain syndrome 08/19/2012  . Adjustment disorder with mixed anxiety and depressed mood 05/11/2012  . Trigeminal neuralgia 05/11/2012  . HTN (hypertension) 05/11/2012   Gabriela Eves, PT, DPT 12/31/2017, 3:51 PM  [facility] [address] [place], [place], [address] Phone:  [phone]   Fax:  [phone]  Name: Matthew [name] MRN: [id number] Date of Birth: 1960/08/25

## 2018-01-01 ENCOUNTER — Ambulatory Visit: Payer: Medicare Other | Admitting: Physical Therapy

## 2018-01-01 ENCOUNTER — Encounter: Payer: Self-pay | Admitting: Physical Therapy

## 2018-01-01 DIAGNOSIS — M25652 Stiffness of left hip, not elsewhere classified: Secondary | ICD-10-CM

## 2018-01-01 DIAGNOSIS — M545 Low back pain, unspecified: Secondary | ICD-10-CM

## 2018-01-01 DIAGNOSIS — M25552 Pain in left hip: Secondary | ICD-10-CM

## 2018-01-01 DIAGNOSIS — R262 Difficulty in walking, not elsewhere classified: Secondary | ICD-10-CM | POA: Diagnosis not present

## 2018-01-01 DIAGNOSIS — M6281 Muscle weakness (generalized): Secondary | ICD-10-CM

## 2018-01-01 NOTE — Therapy (Signed)
[facility] [place], [place], [address] Phone: [phone]   Fax:  [phone]  Physical Therapy Treatment  Patient Details  Name: Matthew [name] MRN: [id number] Date of Birth: 1960/05/23 Referring Provider (PT): Arther Abbott, MD   Encounter Date: 01/01/2018  PT End of Session - 01/01/18 1417    Visit Number  3    Number of Visits  18    Date for PT Re-Evaluation  02/12/18    Authorization Type  Progress note every 10th visit; KX modifier at 15th visit    PT Start Time  1345    PT Stop Time  1433    PT Time Calculation (min)  48 min    Activity Tolerance  Patient tolerated treatment well    Behavior During Therapy  [facility] for tasks assessed/performed       Past Medical History:  Diagnosis Date  . Abscess   . Anxiety   . Aphthous ulcer   . Arthritis   . Asthma   . Chronic bronchitis   . Chronic pain   . COPD (chronic obstructive pulmonary disease) ([place])   . CVA (cerebral vascular accident) ([place])   . Depression   . Hyperlipidemia   . Hypertension   . Trigeminal neuralgia   . Vertigo     Past Surgical History:  Procedure Laterality Date  . CHOLECYSTECTOMY    . HAND RECONSTRUCTION Right 1990's    There were no vitals filed for this visit.  Subjective Assessment - 01/01/18 1348    Subjective  Patient reports feeling no pain at the moment; only had discomfort last visit after lying supine with LEs elevated on the bolster.    Pertinent History  CVA 10/22/17, HTN, COPD    Limitations  Sitting;Standing;Walking;House hold activities    Patient Stated Goals  no pain    Currently in Pain?  No/denies         [facility] PT Assessment - 01/01/18 0001      Assessment   Medical Diagnosis  lumbar DDD; primary osteoarthritis of L hip    Referring Provider (PT)  Arther Abbott, MD    Next MD Visit  December, 2019    Prior Therapy  no                   [facility] Adult PT Treatment/Exercise - 01/01/18 0001       Exercises   Exercises  Knee/Hip;Lumbar      Lumbar Exercises: Supine   Clam  20 reps   red   Bent Knee Raise  20 reps;2 seconds    Bridge  Compliant;20 reps;3 seconds    Straight Leg Raise  20 reps    Other Supine Lumbar Exercises  hip adduction squeeze 5" hold x20      Knee/Hip Exercises: Aerobic   Nustep  Level 4 x15 mins      Modalities   Modalities  Electrical Stimulation;Moist Heat      Moist Heat Therapy   Number Minutes Moist Heat  15 Minutes    Moist Heat Location  Hip      Electrical Stimulation   Electrical Stimulation Location  left hip    Electrical Stimulation Action  pre-mod    Electrical Stimulation Parameters  80-150 hz x15 min    Electrical Stimulation Goals  Pain                  PT Long Term Goals - 12/25/17 2153  PT LONG TERM GOAL #1   Title  Patient will be independent with HEP    Time  6    Period  Weeks    Status  New      PT LONG TERM GOAL #2   Title  Patient will demonstrate 4+/5 or greater hip and knee MMT to improve stability during functional tasks.     Time  6    Period  Weeks    Status  New      PT LONG TERM GOAL #3   Title  Patient will report ability to perform ADLs and home activities with left hip pain less than 4/10.    Time  6    Period  Weeks    Status  New            Plan - 01/01/18 1408    Clinical Impression Statement  Patient was able to tolerate treatment well with minimal reports of increased pain. Patient continues to require cuing for form and technique. Patient educated on spine and nerve anatomy and possible causes of pain radiating from left hip to foot depite no symptoms in the back. E-stim to L hip and moist heat pack provided in sitting as supine with LEs elevated increases L hip pain.     Clinical Presentation  Evolving    Clinical Decision Making  Moderate    Rehab Potential  Good    PT Frequency  3x / week    PT Duration  6 weeks    PT Treatment/Interventions  ADLs/Self Care Home  Management;Neuromuscular re-education;Electrical Stimulation;Cryotherapy;Moist Heat;Gait training;Stair training;Iontophoresis 4mg /ml Dexamethasone;Therapeutic activities;Ultrasound;Therapeutic exercise;Balance training;Passive range of motion;Manual techniques;Patient/family education    PT Next Visit Plan  Nustep or bike, core strengthening, LE strengthening modalities PRN for pain relief.    Consulted and Agree with Plan of Care  Patient       Patient will benefit from skilled therapeutic intervention in order to improve the following deficits and impairments:  Pain, Postural dysfunction, Decreased strength, Decreased range of motion, Difficulty walking  Visit Diagnosis: Pain in left hip  Muscle weakness (generalized)  Stiffness of left hip, not elsewhere classified  Low back pain, unspecified back pain laterality, unspecified chronicity, unspecified whether sciatica present     Problem List Patient Active Problem List   Diagnosis Date Noted  . DDD (degenerative disc disease), lumbar 11/13/2017  . Femoroacetabular impingement of both hips 11/13/2017  . Late effect of cerebrovascular accident (CVA) 10/25/2017  . Acute CVA (cerebrovascular accident) ([place]) 10/22/2017  . Tobacco use 10/22/2017  . Hyperglycemia 10/22/2017  . Depression 12/25/2016  . Abnormal drug screen 11/02/2016  . Pain management contract broken 03/09/2016  . Coronary artery calcification 01/18/2016  . Thoracic aortic atherosclerosis ([place]) 01/18/2016  . Chronic back pain 08/03/2015  . Opioid dependence ([place]) 08/03/2015  . Obesity (BMI 30-39.9) 03/09/2015  . Smoker 02/02/2015  . Erectile dysfunction 08/24/2014  . Vitamin D deficiency 04/18/2014  . Hyperlipidemia 04/18/2014  . COPD (chronic obstructive pulmonary disease) ([place]) 04/17/2014  . Weakness 08/22/2012  . Chronic pain syndrome 08/19/2012  . Adjustment disorder with mixed anxiety and depressed mood 05/11/2012  . Trigeminal neuralgia 05/11/2012  .  HTN (hypertension) 05/11/2012   Gabriela Eves, PT, DPT 01/01/2018, 2:39 PM  [facility] [address] [place], [place], [address] Phone: [phone]   Fax:  [phone]  Name: Matthew [name] MRN: [id number] Date of Birth: [birth year]

## 2018-01-02 ENCOUNTER — Encounter: Payer: Self-pay | Admitting: Pulmonary Disease

## 2018-01-02 ENCOUNTER — Ambulatory Visit (INDEPENDENT_AMBULATORY_CARE_PROVIDER_SITE_OTHER): Payer: Medicare Other | Admitting: Pulmonary Disease

## 2018-01-02 VITALS — BP 124/72 | HR 58 | Ht 69.0 in | Wt 234.0 lb

## 2018-01-02 DIAGNOSIS — R0683 Snoring: Secondary | ICD-10-CM | POA: Diagnosis not present

## 2018-01-02 DIAGNOSIS — Z8673 Personal history of transient ischemic attack (TIA), and cerebral infarction without residual deficits: Secondary | ICD-10-CM | POA: Diagnosis not present

## 2018-01-02 NOTE — Progress Notes (Signed)
Matthew [name]    [id number]    04/22/1960  Primary Care Physician:Hawks, Theador Hawthorne, FNP  Referring Physician: [name], [place] [address] [place], [place] [address]  Chief complaint:   Patient with a history of snoring Recent CVA in September 2019  HPI:  Patient was accompanied by his son History of snoring, Wakes up multiple times during the night sometimes to use the bathroom Usually goes to bed about 8:52 PM, takes him about 45 minutes to an hour to fall asleep Final up time of 6 AM  He recently had a CVA from which he has recovered almost fully  An active smoker  Denies significant sleepiness during the day He feels he sleeps well at night and has no concerns about his sleep at present  Outpatient Encounter Medications as of 01/02/2018  Medication Sig  . albuterol (VENTOLIN HFA) 108 (90 Base) MCG/ACT inhaler INHALE 2 PUFFS INTO LUNGS EVERY 6 HOURS AS NEEDED FOR WHEEZE OR SHORTNESS OF BREATH (Patient taking differently: Inhale 2 puffs into the lungs every 6 (six) hours as needed for shortness of breath. )  . amLODipine (NORVASC) 10 MG tablet TAKE 1 TABLET BY MOUTH EVERY DAY  . aspirin 325 MG tablet Take 1 tablet (325 mg total) by mouth daily.  Marland Kitchen atorvastatin (LIPITOR) 40 MG tablet Take 1 tablet (40 mg total) by mouth daily at 6 PM.  . carbamazepine (TEGRETOL) 200 MG tablet TAKE 2 TABLETS BY MOUTH 4 TIMES A DAY  . cyclobenzaprine (FLEXERIL) 10 MG tablet Take 1 tablet (10 mg total) by mouth 3 (three) times daily.  Marland Kitchen dexamethasone (DECADRON) 4 MG tablet TAKE 1 TABLET (4 MG TOTAL) BY MOUTH 2 (TWO) TIMES DAILY WITH A MEAL.  . fluticasone (FLONASE) 50 MCG/ACT nasal spray USE 2 SPRAYS INTO BOTH NOSTRILS ONCE DAILY (Patient taking differently: Place 2 sprays into both nostrils daily. )  . gabapentin (NEURONTIN) 600 MG tablet Take 1 tablet in the morning, 1 tablet in the afternoon, and 1.5 tablet at bedtime (Patient taking differently: Take 600 mg by mouth 3  (three) times daily. )  . hydrOXYzine (ATARAX/VISTARIL) 25 MG tablet Take 1 tablet (25 mg total) by mouth 3 (three) times daily as needed for anxiety.  Marland Kitchen lisinopril-hydrochlorothiazide (ZESTORETIC) 20-12.5 MG tablet Take 2 tablets by mouth daily.  . traMADol (ULTRAM) 50 MG tablet 1 or 2 po q6h prn pain  . TRELEGY ELLIPTA 100-62.5-25 MCG/INH AEPB TAKE 1 PUFF BY MOUTH EVERY DAY   No facility-administered encounter medications on file as of 01/02/2018.     Allergies as of 01/02/2018 - Review Complete 01/02/2018  Allergen Reaction Noted  . Penicillins  07/21/2010    Past Medical History:  Diagnosis Date  . Abscess   . Anxiety   . Aphthous ulcer   . Arthritis   . Asthma   . Chronic bronchitis   . Chronic pain   . COPD (chronic obstructive pulmonary disease) ([place])   . CVA (cerebral vascular accident) ([place])   . Depression   . Hyperlipidemia   . Hypertension   . Trigeminal neuralgia   . Vertigo     Past Surgical History:  Procedure Laterality Date  . CHOLECYSTECTOMY    . HAND RECONSTRUCTION Right 1990's    Family History  Problem Relation Age of Onset  . Hypertension Mother   . Diabetes Mother   . Hypertension Father   . Diabetes Father   . Cancer Father   .  Emphysema Father     Social History   Socioeconomic History  . Marital status: Legally Separated    Spouse name: Not on file  . Number of children: Not on file  . Years of education: Not on file  . Highest education level: Not on file  Occupational History  . Not on file  Social Needs  . Financial resource strain: Not on file  . Food insecurity:    Worry: Not on file    Inability: Not on file  . Transportation needs:    Medical: Not on file    Non-medical: Not on file  Tobacco Use  . Smoking status: Current Every Day Smoker    Packs/day: 2.00    Years: 40.00    Pack years: 80.00    Types: Cigarettes    Start date: 07/23/1975  . Smokeless tobacco: Never Used  Substance and Sexual Activity  . Alcohol  use: Yes    Comment: 08/21/2012 "quit drinking ~ 1985"  . Drug use: No  . Sexual activity: Yes  Lifestyle  . Physical activity:    Days per week: Not on file    Minutes per session: Not on file  . Stress: Not on file  Relationships  . Social connections:    Talks on phone: Not on file    Gets together: Not on file    Attends religious service: Not on file    Active member of club or organization: Not on file    Attends meetings of clubs or organizations: Not on file    Relationship status: Not on file  . Intimate partner violence:    Fear of current or ex partner: Not on file    Emotionally abused: Not on file    Physically abused: Not on file    Forced sexual activity: Not on file  Other Topics Concern  . Not on file  Social History Narrative   Highest level of education: 9th grade    Review of Systems  Constitutional: Negative.   HENT: Negative.   Respiratory: Positive for apnea.   Cardiovascular: Negative.   Neurological:       History of recent CVA  Psychiatric/Behavioral: Positive for sleep disturbance.    Vitals:   01/02/18 1423  BP: 124/72  Pulse: (!) 58  SpO2: 98%   Physical Exam  Constitutional: He is oriented to person, place, and time. He appears well-developed and well-nourished.  HENT:  Head: Normocephalic and atraumatic.  Obese, Mallampati 3  Eyes: Pupils are equal, round, and reactive to light. Conjunctivae are normal. Right eye exhibits no discharge. Left eye exhibits no discharge.  Neck: Normal range of motion. Neck supple. No tracheal deviation present. No thyromegaly present.  Cardiovascular: Normal rate and regular rhythm.  Pulmonary/Chest: Effort normal and breath sounds normal. No respiratory distress. He has no wheezes.  Abdominal: Soft. Bowel sounds are normal. He exhibits no distension. There is no tenderness.  Musculoskeletal: Normal range of motion. He exhibits no edema or deformity.  Neurological: He is alert and oriented to person,  place, and time. No cranial nerve deficit.  Skin: Skin is warm and dry.   Data Reviewed: Recent hospital records reviewed  Assessment:   Patient with a history of snoring, witnessed apneas  Recent CVA  History of obstructive lung disease, active smoker  Concern for significant sleep disordered breathing, may also have central sleep apnea, may also have obesity hypoventilation based on his body habitus  Plan/Recommendations:  We will order an in lab  polysomnogram  A split-night study will be appropriate  Pathophysiology of sleep disordered breathing discussed  Treatment options discussed  Importance of increasing activity and weight loss discussed  I will see him back in the office in about 3 months  Sherrilyn Rist MD [facility] 01/02/2018, 2:43 PM  CC: [name], FNP

## 2018-01-02 NOTE — Patient Instructions (Signed)
Patient with a recent CVA  History of snoring, possible sleep apnea  Recent stroke places him at increased risk of obstructive sleep apnea and central sleep apnea  We will order an in lab split-night study  I will see him back in the office in about 3   Sleep Apnea Sleep apnea is a condition in which breathing pauses or becomes shallow during sleep. Episodes of sleep apnea usually last 10 seconds or longer, and they may occur as many as 20 times an hour. Sleep apnea disrupts your sleep and keeps your body from getting the rest that it needs. This condition can increase your risk of certain health problems, including:  Heart attack.  Stroke.  Obesity.  Diabetes.  Heart failure.  Irregular heartbeat.  There are three kinds of sleep apnea:  Obstructive sleep apnea. This kind is caused by a blocked or collapsed airway.  Central sleep apnea. This kind happens when the part of the brain that controls breathing does not send the correct signals to the muscles that control breathing.  Mixed sleep apnea. This is a combination of obstructive and central sleep apnea.  What are the causes? The most common cause of this condition is a collapsed or blocked airway. An airway can collapse or become blocked if:  Your throat muscles are abnormally relaxed.  Your tongue and tonsils are larger than normal.  You are overweight.  Your airway is smaller than normal.  What increases the risk? This condition is more likely to develop in people who:  Are overweight.  Smoke.  Have a smaller than normal airway.  Are elderly.  Are male.  Drink alcohol.  Take sedatives or tranquilizers.  Have a family history of sleep apnea.  What are the signs or symptoms? Symptoms of this condition include:  Trouble staying asleep.  Daytime sleepiness and tiredness.  Irritability.  Loud snoring.  Morning headaches.  Trouble concentrating.  Forgetfulness.  Decreased interest in  sex.  Unexplained sleepiness.  Mood swings.  Personality changes.  Feelings of depression.  Waking up often during the night to urinate.  Dry mouth.  Sore throat.  How is this diagnosed? This condition may be diagnosed with:  A medical history.  A physical exam.  A series of tests that are done while you are sleeping (sleep study). These tests are usually done in a sleep lab, but they may also be done at home.  How is this treated? Treatment for this condition aims to restore normal breathing and to ease symptoms during sleep. It may involve managing health issues that can affect breathing, such as high blood pressure or obesity. Treatment may include:  Sleeping on your side.  Using a decongestant if you have nasal congestion.  Avoiding the use of depressants, including alcohol, sedatives, and narcotics.  Losing weight if you are overweight.  Making changes to your diet.  Quitting smoking.  Using a device to open your airway while you sleep, such as: ? An oral appliance. This is a custom-made mouthpiece that shifts your lower jaw forward. ? A continuous positive airway pressure (CPAP) device. This device delivers oxygen to your airway through a mask. ? A nasal expiratory positive airway pressure (EPAP) device. This device has valves that you put into each nostril. ? A bi-level positive airway pressure (BPAP) device. This device delivers oxygen to your airway through a mask.  Surgery if other treatments do not work. During surgery, excess tissue is removed to create a wider airway.  It is  important to get treatment for sleep apnea. Without treatment, this condition can lead to:  High blood pressure.  Coronary artery disease.  (Men) An inability to achieve or maintain an erection (impotence).  Reduced thinking abilities.  Follow these instructions at home:  Make any lifestyle changes that your health care provider recommends.  Eat a healthy, well-balanced  diet.  Take over-the-counter and prescription medicines only as told by your health care provider.  Avoid using depressants, including alcohol, sedatives, and narcotics.  Take steps to lose weight if you are overweight.  If you were given a device to open your airway while you sleep, use it only as told by your health care provider.  Do not use any tobacco products, such as cigarettes, chewing tobacco, and e-cigarettes. If you need help quitting, ask your health care provider.  Keep all follow-up visits as told by your health care provider. This is important. Contact a health care provider if:  The device that you received to open your airway during sleep is uncomfortable or does not seem to be working.  Your symptoms do not improve.  Your symptoms get worse. Get help right away if:  You develop chest pain.  You develop shortness of breath.  You develop discomfort in your back, arms, or stomach.  You have trouble speaking.  You have weakness on one side of your body.  You have drooping in your face. These symptoms may represent a serious problem that is an emergency. Do not wait to see if the symptoms will go away. Get medical help right away. Call your local emergency services (911 in the [place] Do not drive yourself to the hospital. This information is not intended to replace advice given to you by your health care provider. Make sure you discuss any questions you have with your health care provider. Document Released: 01/20/2002 Document Revised: 09/26/2015 Document Reviewed: 11/09/2014 Elsevier Interactive Patient Education  2018 [employer].  months

## 2018-01-03 ENCOUNTER — Encounter: Payer: Self-pay | Admitting: Physical Therapy

## 2018-01-03 ENCOUNTER — Ambulatory Visit: Payer: Medicare Other | Admitting: Physical Therapy

## 2018-01-03 DIAGNOSIS — M25652 Stiffness of left hip, not elsewhere classified: Secondary | ICD-10-CM | POA: Diagnosis not present

## 2018-01-03 DIAGNOSIS — M25552 Pain in left hip: Secondary | ICD-10-CM

## 2018-01-03 DIAGNOSIS — R262 Difficulty in walking, not elsewhere classified: Secondary | ICD-10-CM | POA: Diagnosis not present

## 2018-01-03 DIAGNOSIS — M6281 Muscle weakness (generalized): Secondary | ICD-10-CM | POA: Diagnosis not present

## 2018-01-03 DIAGNOSIS — M545 Low back pain: Secondary | ICD-10-CM | POA: Diagnosis not present

## 2018-01-03 NOTE — Therapy (Signed)
[facility] [place], [place], [address] Phone: [phone]   Fax:  [phone]  Physical Therapy Treatment  Patient Details  Name: Matthew [name] MRN: [id number] Date of Birth: [birth year] Referring Provider (PT): Arther Abbott, MD   Encounter Date: 01/03/2018  PT End of Session - 01/03/18 1349    Visit Number  4    Number of Visits  18    Date for PT Re-Evaluation  02/12/18    Authorization Type  Progress note every 10th visit; KX modifier at 15th visit    PT Start Time  1348    PT Stop Time  1433    PT Time Calculation (min)  45 min    Activity Tolerance  Patient tolerated treatment well    Behavior During Therapy  [facility] for tasks assessed/performed       Past Medical History:  Diagnosis Date  . Abscess   . Anxiety   . Aphthous ulcer   . Arthritis   . Asthma   . Chronic bronchitis   . Chronic pain   . COPD (chronic obstructive pulmonary disease) ([place])   . CVA (cerebral vascular accident) ([place])   . Depression   . Hyperlipidemia   . Hypertension   . Trigeminal neuralgia   . Vertigo     Past Surgical History:  Procedure Laterality Date  . CHOLECYSTECTOMY    . HAND RECONSTRUCTION Right 1990's    There were no vitals filed for this visit.  Subjective Assessment - 01/03/18 1348    Subjective  Reports that he has more pain at night time and SL.    Pertinent History  CVA 10/22/17, HTN, COPD    Limitations  Sitting;Standing;Walking;House hold activities    Patient Stated Goals  no pain    Currently in Pain?  No/denies         [facility] PT Assessment - 01/03/18 0001      Assessment   Medical Diagnosis  lumbar DDD; primary osteoarthritis of L hip    Referring Provider (PT)  Arther Abbott, MD    Next MD Visit  December, 2019    Prior Therapy  no                   [facility] Adult PT Treatment/Exercise - 01/03/18 0001      Exercises   Exercises  Knee/Hip;Lumbar      Knee/Hip Exercises: Aerobic    Stationary Bike  L2 x12 min      Knee/Hip Exercises: Standing   Hip Flexion  AROM;Both;10 reps;Knee bent    Hip Abduction  AROM;Both;15 reps;Knee straight    Hip Extension  AROM;Both;5 reps   fatgied     Knee/Hip Exercises: Supine   Hip Adduction Isometric  Strengthening;Both;15 reps    Bridges  Strengthening;15 reps    Straight Leg Raises  AROM;Both;15 reps    Other Supine Knee/Hip Exercises  B clam red theraband x15 reps      Modalities   Modalities  Electrical Stimulation;Moist Heat      Moist Heat Therapy   Number Minutes Moist Heat  15 Minutes    Moist Heat Location  Hip      Electrical Stimulation   Electrical Stimulation Location  left hip    Electrical Stimulation Action  Pre-Mod    Electrical Stimulation Parameters  80-150 hz x15 min    Electrical Stimulation Goals  Pain  PT Long Term Goals - 12/25/17 2153      PT LONG TERM GOAL #1   Title  Patient will be independent with HEP    Time  6    Period  Weeks    Status  New      PT LONG TERM GOAL #2   Title  Patient will demonstrate 4+/5 or greater hip and knee MMT to improve stability during functional tasks.     Time  6    Period  Weeks    Status  New      PT LONG TERM GOAL #3   Title  Patient will report ability to perform ADLs and home activities with left hip pain less than 4/10.    Time  6    Period  Weeks    Status  New            Plan - 01/03/18 1427    Clinical Impression Statement  Patient arrived in clinic with no complaints of hip pain. Patient guided through standing and supine hip/knee strengthening with greatest complaint being of fatigue predominately with LLE. Smaller bolster for only under knees was utilized today for modalities but patient reported pain in B hips still.     Rehab Potential  Good    PT Frequency  3x / week    PT Duration  6 weeks    PT Treatment/Interventions  ADLs/Self Care Home Management;Neuromuscular re-education;Electrical  Stimulation;Cryotherapy;Moist Heat;Gait training;Stair training;Iontophoresis 4mg /ml Dexamethasone;Therapeutic activities;Ultrasound;Therapeutic exercise;Balance training;Passive range of motion;Manual techniques;Patient/family education    PT Next Visit Plan  Nustep or bike, core strengthening, LE strengthening modalities PRN for pain relief.    Consulted and Agree with Plan of Care  Patient       Patient will benefit from skilled therapeutic intervention in order to improve the following deficits and impairments:  Pain, Postural dysfunction, Decreased strength, Decreased range of motion, Difficulty walking  Visit Diagnosis: Pain in left hip  Muscle weakness (generalized)  Stiffness of left hip, not elsewhere classified     Problem List Patient Active Problem List   Diagnosis Date Noted  . DDD (degenerative disc disease), lumbar 11/13/2017  . Femoroacetabular impingement of both hips 11/13/2017  . Late effect of cerebrovascular accident (CVA) 10/25/2017  . Acute CVA (cerebrovascular accident) ([place]) 10/22/2017  . Tobacco use 10/22/2017  . Hyperglycemia 10/22/2017  . Depression 12/25/2016  . Abnormal drug screen 11/02/2016  . Pain management contract broken 03/09/2016  . Coronary artery calcification 01/18/2016  . Thoracic aortic atherosclerosis ([place]) 01/18/2016  . Chronic back pain 08/03/2015  . Opioid dependence ([place]) 08/03/2015  . Obesity (BMI 30-39.9) 03/09/2015  . Smoker 02/02/2015  . Erectile dysfunction 08/24/2014  . Vitamin D deficiency 04/18/2014  . Hyperlipidemia 04/18/2014  . COPD (chronic obstructive pulmonary disease) ([place]) 04/17/2014  . Weakness 08/22/2012  . Chronic pain syndrome 08/19/2012  . Adjustment disorder with mixed anxiety and depressed mood 05/11/2012  . Trigeminal neuralgia 05/11/2012  . HTN (hypertension) 05/11/2012    Standley Brooking, PTA 01/03/2018, 2:38 PM  [facility] [address] [place], [place], [address] Phone: [phone]   Fax:  [phone]  Name: MALAHKI GASAWAY MRN: [id number] Date of Birth: 14-May-1960

## 2018-01-08 ENCOUNTER — Ambulatory Visit: Payer: Medicare Other | Attending: Pulmonary Disease | Admitting: Pulmonary Disease

## 2018-01-08 DIAGNOSIS — I493 Ventricular premature depolarization: Secondary | ICD-10-CM | POA: Diagnosis not present

## 2018-01-08 DIAGNOSIS — G4733 Obstructive sleep apnea (adult) (pediatric): Secondary | ICD-10-CM | POA: Diagnosis not present

## 2018-01-08 DIAGNOSIS — R0683 Snoring: Secondary | ICD-10-CM | POA: Diagnosis not present

## 2018-01-08 DIAGNOSIS — G4761 Periodic limb movement disorder: Secondary | ICD-10-CM | POA: Diagnosis not present

## 2018-01-08 DIAGNOSIS — Z8673 Personal history of transient ischemic attack (TIA), and cerebral infarction without residual deficits: Secondary | ICD-10-CM | POA: Diagnosis not present

## 2018-01-09 ENCOUNTER — Encounter: Payer: Medicare Other | Admitting: Physical Therapy

## 2018-01-09 ENCOUNTER — Telehealth: Payer: Self-pay | Admitting: *Deleted

## 2018-01-09 DIAGNOSIS — G500 Trigeminal neuralgia: Secondary | ICD-10-CM

## 2018-01-09 MED ORDER — GABAPENTIN 600 MG PO TABS: ORAL_TABLET | ORAL | 1 refills | Status: DC

## 2018-01-09 NOTE — Telephone Encounter (Signed)
Pt aware RF sent to pharmacy

## 2018-01-11 ENCOUNTER — Telehealth: Payer: Self-pay | Admitting: Family

## 2018-01-11 ENCOUNTER — Other Ambulatory Visit: Payer: Self-pay | Admitting: Family

## 2018-01-11 MED ORDER — HYDROXYZINE HCL 25 MG PO TABS: 25.0000 mg | ORAL_TABLET | Freq: Three times a day (TID) | ORAL | 2 refills | Status: DC | PRN

## 2018-01-14 ENCOUNTER — Telehealth: Payer: Self-pay | Admitting: Pulmonary Disease

## 2018-01-14 NOTE — Telephone Encounter (Signed)
LMom for the son to call us back.

## 2018-01-14 NOTE — Telephone Encounter (Signed)
Sleep study result  Date of study 01/08/18  Negative for significant sleep disordered breathing  Encourage weight loss measures Elevation of the head of the bed may help with snoring May consider an oral device for the management of snoring  Presence of periodic limb movement-follow-up in the office, evaluation for restless legs and nonrestorative sleep-medications may be indicated to manage this

## 2018-01-14 NOTE — Procedures (Signed)
POLYSOMNOGRAPHY  Last, First: Matthew [name], Matthew [name] MRN: [id number] Gender: Male Age (years): 57 Weight (lbs): 234 DOB: 03/01/1960 BMI: 35 Primary Care: No PCP Epworth Score: 5 Referring: Laurin Coder MD Technician: Rosebud Poles Interpreting: Laurin Coder MD Study Type: NPSG Ordered Study Type: Split Night CPAP Study date: 01/08/2018 Location: [place] CLINICAL INFORMATION Rue Tinnel is a 57 year old Male and was referred to the sleep center for evaluation of N/A. Indications include Snoring.  MEDICATIONS Patient self administered medications include: N/A. Medications administered during study include No sleep medicine administered.  SLEEP STUDY TECHNIQUE A multi-channel overnight Polysomnography study was performed. The channels recorded and monitored were central and occipital EEG, electrooculogram (EOG), submentalis EMG (chin), nasal and oral airflow, thoracic and abdominal wall motion, anterior tibialis EMG, snore microphone, electrocardiogram, and a pulse oximetry. TECHNICIAN COMMENTS Comments added by Technician: Patient was restless all through the night. Patient became restless during the mid to latter part of study. Loud to very loud snoring noticed. Events increased in REM stage. Chewing episodes were noticed at times, associated with significant EEG arousals Comments added by Scorer: N/A SLEEP ARCHITECTURE The study was initiated at 9:59:39 PM and terminated at 4:32:21 AM. The total recorded time was 392.7 minutes. EEG confirmed total sleep time was 253.9 minutes yielding a sleep efficiency of 64.7%%. Sleep onset after lights out was 5.8 minutes with a REM latency of 354.5 minutes. The patient spent 2.4%% of the night in stage N1 sleep, 48.0%% in stage N2 sleep, 38.6%% in stage N3 and 11% in REM. Wake after sleep onset (WASO) was 133.0 minutes. The Arousal Index was 18.0/hour. RESPIRATORY PARAMETERS There were a total of 19 respiratory disturbances out of which 0 were  apneas ( 0 obstructive, 0 mixed, 0 central) and 19 hypopneas. The apnea/hypopnea index (AHI) was 4.5 events/hour. The central sleep apnea index was 0.0 events/hour. The REM AHI was 40.7 events/hour and NREM AHI was 0.0 events/hour. The supine AHI was N/A events/hour and the non supine AHI was 4.5 supine during 0.00% of sleep. Respiratory disturbances were associated with oxygen desaturation down to a nadir of 87.0% during sleep. The mean oxygen saturation during the study was 93.2%. The cumulative time under 88% oxygen saturation was 5.5 minutes.  LEG MOVEMENT DATA The total leg movements were 96 with a resulting leg movement index of 22.7/hr .Associated arousal with leg movement index was 7.1/hr.  CARDIAC DATA The underlying cardiac rhythm was most consistent with sinus rhythm. Mean heart rate during sleep was 51.7 bpm. Additional rhythm abnormalities include PVCs.   IMPRESSIONS - No Significant Obstructive Sleep apnea(OSA) - Electrocardiographic data showed presence of PVCs. - Mild Oxygen Desaturation - The patient snored with loud snoring volume. - Periodic leg movements(PLMs) during sleep. Associated arousal index of 7.1 /hour.   DIAGNOSIS - Periodic Limb Movement During Sleep (327.51 [G47.61 ICD-10])   RECOMMENDATIONS - Mirapex, Requip, or Sinemet for treatment of Periodic Leg Movements of Sleep if associated with nonrestorative sleep or restless leg syndrome. - Optimize sleep position by promoting lateral sleep, elevation of the head of the bed by at least 30degrees. - Snoring may be managed with use of an oral device - Avoid alcohol, sedatives and other CNS depressants that may worsen sleep apnea and disrupt normal sleep architecture. - Sleep hygiene should be reviewed to assess factors that may improve sleep quality. - Weight management and regular exercise should be initiated or continued.  [Electronically signed] 01/14/2018 06:52 AM  Sherrilyn Rist MD NPI: [id number]

## 2018-01-15 ENCOUNTER — Encounter: Payer: Self-pay | Admitting: Physical Therapy

## 2018-01-15 ENCOUNTER — Ambulatory Visit: Payer: Medicare Other | Attending: Orthopedic Surgery | Admitting: Physical Therapy

## 2018-01-15 DIAGNOSIS — M545 Low back pain, unspecified: Secondary | ICD-10-CM

## 2018-01-15 DIAGNOSIS — M6281 Muscle weakness (generalized): Secondary | ICD-10-CM | POA: Insufficient documentation

## 2018-01-15 DIAGNOSIS — M25552 Pain in left hip: Secondary | ICD-10-CM | POA: Insufficient documentation

## 2018-01-15 DIAGNOSIS — M25652 Stiffness of left hip, not elsewhere classified: Secondary | ICD-10-CM | POA: Diagnosis not present

## 2018-01-15 NOTE — Therapy (Signed)
[facility] [place], [place], [address] Phone: [phone]   Fax:  [phone]  Physical Therapy Treatment  Patient Details  Name: Matthew [name] MRN: [id number] Date of Birth: [birth year] Referring Provider (PT): Arther Abbott, MD   Encounter Date: 01/15/2018  PT End of Session - 01/15/18 1355    Visit Number  5    Number of Visits  18    Date for PT Re-Evaluation  02/12/18    Authorization Type  Progress note every 10th visit; KX modifier at 15th visit    PT Start Time  1349    PT Stop Time  1434    PT Time Calculation (min)  45 min    Activity Tolerance  Patient tolerated treatment well    Behavior During Therapy  [facility] for tasks assessed/performed       Past Medical History:  Diagnosis Date  . Abscess   . Anxiety   . Aphthous ulcer   . Arthritis   . Asthma   . Chronic bronchitis   . Chronic pain   . COPD (chronic obstructive pulmonary disease) ([place])   . CVA (cerebral vascular accident) ([place])   . Depression   . Hyperlipidemia   . Hypertension   . Trigeminal neuralgia   . Vertigo     Past Surgical History:  Procedure Laterality Date  . CHOLECYSTECTOMY    . HAND RECONSTRUCTION Right 1990's    There were no vitals filed for this visit.  Subjective Assessment - 01/15/18 1355    Subjective  Reports only soreness but no pain. Reports that his LEs feel weak more than anything.    Pertinent History  CVA 10/22/17, HTN, COPD    Limitations  Sitting;Standing;Walking;House hold activities    Patient Stated Goals  no pain    Currently in Pain?  No/denies         [facility] PT Assessment - 01/15/18 0001      Assessment   Medical Diagnosis  lumbar DDD; primary osteoarthritis of L hip    Referring Provider (PT)  Arther Abbott, MD    Next MD Visit  December, 2019    Prior Therapy  no                   [facility] Adult PT Treatment/Exercise - 01/15/18 0001      Exercises   Exercises  Knee/Hip;Lumbar       Knee/Hip Exercises: Aerobic   Nustep  L4 x15 min      Knee/Hip Exercises: Supine   Hip Adduction Isometric  Strengthening;20 reps    Bridges  Strengthening;Both;20 reps    Straight Leg Raises  AROM;Both;20 reps    Other Supine Knee/Hip Exercises  B clam green theraband x20 reps      Modalities   Modalities  Electrical Stimulation;Moist Heat      Moist Heat Therapy   Number Minutes Moist Heat  15 Minutes    Moist Heat Location  Hip      Electrical Stimulation   Electrical Stimulation Location  B low back    Electrical Stimulation Action  Pre-Mod    Electrical Stimulation Parameters  80-150 hz x15 min    Electrical Stimulation Goals  Pain                  PT Long Term Goals - 12/25/17 2153      PT LONG TERM GOAL #1   Title  Patient will be independent with HEP  Time  6    Period  Weeks    Status  New      PT LONG TERM GOAL #2   Title  Patient will demonstrate 4+/5 or greater hip and knee MMT to improve stability during functional tasks.     Time  6    Period  Weeks    Status  New      PT LONG TERM GOAL #3   Title  Patient will report ability to perform ADLs and home activities with left hip pain less than 4/10.    Time  6    Period  Weeks    Status  New            Plan - 01/15/18 1421    Clinical Impression Statement  Patient tolerated today's treatment well reporting LE weakness and soreness. Greater reports of discomfort in LEs with strengthening exercises. Patient denied any LBP throughout therex session. Patient unsure if soreness and weakness is coming from the stroke or anything else. Normal modalities response noted following removal of the modalities.    Rehab Potential  Good    PT Frequency  3x / week    PT Duration  6 weeks    PT Treatment/Interventions  ADLs/Self Care Home Management;Neuromuscular re-education;Electrical Stimulation;Cryotherapy;Moist Heat;Gait training;Stair training;Iontophoresis 4mg /ml Dexamethasone;Therapeutic  activities;Ultrasound;Therapeutic exercise;Balance training;Passive range of motion;Manual techniques;Patient/family education    PT Next Visit Plan  Nustep or bike, core strengthening, LE strengthening modalities PRN for pain relief.    Consulted and Agree with Plan of Care  Patient       Patient will benefit from skilled therapeutic intervention in order to improve the following deficits and impairments:  Pain, Postural dysfunction, Decreased strength, Decreased range of motion, Difficulty walking  Visit Diagnosis: Pain in left hip  Muscle weakness (generalized)  Stiffness of left hip, not elsewhere classified  Low back pain, unspecified back pain laterality, unspecified chronicity, unspecified whether sciatica present     Problem List Patient Active Problem List   Diagnosis Date Noted  . DDD (degenerative disc disease), lumbar 11/13/2017  . Femoroacetabular impingement of both hips 11/13/2017  . Late effect of cerebrovascular accident (CVA) 10/25/2017  . Acute CVA (cerebrovascular accident) ([place]) 10/22/2017  . Tobacco use 10/22/2017  . Hyperglycemia 10/22/2017  . Depression 12/25/2016  . Abnormal drug screen 11/02/2016  . Pain management contract broken 03/09/2016  . Coronary artery calcification 01/18/2016  . Thoracic aortic atherosclerosis ([place]) 01/18/2016  . Chronic back pain 08/03/2015  . Opioid dependence ([place]) 08/03/2015  . Obesity (BMI 30-39.9) 03/09/2015  . Smoker 02/02/2015  . Erectile dysfunction 08/24/2014  . Vitamin D deficiency 04/18/2014  . Hyperlipidemia 04/18/2014  . COPD (chronic obstructive pulmonary disease) ([place]) 04/17/2014  . Weakness 08/22/2012  . Chronic pain syndrome 08/19/2012  . Adjustment disorder with mixed anxiety and depressed mood 05/11/2012  . Trigeminal neuralgia 05/11/2012  . HTN (hypertension) 05/11/2012    Standley Brooking, PTA 01/15/2018, 2:42 PM  [facility] [place], [place], [address] Phone: [phone]   Fax:  [phone]  Name: Matthew [name] MRN: [id number] Date of Birth: March 26, 1960

## 2018-01-16 ENCOUNTER — Ambulatory Visit: Payer: Medicare Other | Admitting: Physical Therapy

## 2018-01-16 DIAGNOSIS — M25652 Stiffness of left hip, not elsewhere classified: Secondary | ICD-10-CM

## 2018-01-16 DIAGNOSIS — M545 Low back pain, unspecified: Secondary | ICD-10-CM

## 2018-01-16 DIAGNOSIS — M6281 Muscle weakness (generalized): Secondary | ICD-10-CM | POA: Diagnosis not present

## 2018-01-16 DIAGNOSIS — M25552 Pain in left hip: Secondary | ICD-10-CM

## 2018-01-16 NOTE — Therapy (Signed)
[facility] [place], [place], [address] Phone: [phone]   Fax:  [phone]  Physical Therapy Treatment  Patient Details  Name: Matthew [name] MRN: [id number] Date of Birth: May 30, 1960 Referring Provider (PT): Arther Abbott, MD   Encounter Date: 01/16/2018  PT End of Session - 01/16/18 1405    Visit Number  6    Number of Visits  18    Date for PT Re-Evaluation  02/12/18    Authorization Type  Progress note every 10th visit; KX modifier at 15th visit    PT Start Time  1347    PT Stop Time  1437    PT Time Calculation (min)  50 min    Activity Tolerance  Patient tolerated treatment well    Behavior During Therapy  [facility] for tasks assessed/performed       Past Medical History:  Diagnosis Date  . Abscess   . Anxiety   . Aphthous ulcer   . Arthritis   . Asthma   . Chronic bronchitis   . Chronic pain   . COPD (chronic obstructive pulmonary disease) ([place])   . CVA (cerebral vascular accident) ([place])   . Depression   . Hyperlipidemia   . Hypertension   . Trigeminal neuralgia   . Vertigo     Past Surgical History:  Procedure Laterality Date  . CHOLECYSTECTOMY    . HAND RECONSTRUCTION Right 1990's    There were no vitals filed for this visit.  Subjective Assessment - 01/16/18 1405    Subjective  Reports that both his low back and hips are "pretty good" today.    Pertinent History  CVA 10/22/17, HTN, COPD    Limitations  Sitting;Standing;Walking;House hold activities    Patient Stated Goals  no pain    Currently in Pain?  No/denies         [facility] PT Assessment - 01/16/18 0001      Assessment   Medical Diagnosis  lumbar DDD; primary osteoarthritis of L hip    Referring Provider (PT)  Arther Abbott, MD    Next MD Visit  December, 2019    Prior Therapy  no                   [facility] Adult PT Treatment/Exercise - 01/16/18 0001      Exercises   Exercises  Knee/Hip;Lumbar      Lumbar  Exercises: Standing   Shoulder Extension  Strengthening;Both;20 reps;Limitations    Shoulder Extension Limitations  Pink XTS      Knee/Hip Exercises: Aerobic   Nustep  L4 x18 min      Knee/Hip Exercises: Supine   Hip Adduction Isometric  Strengthening;20 reps    Bridges  Strengthening;Both;20 reps    Straight Leg Raises  AROM;Both;20 reps    Other Supine Knee/Hip Exercises  B clam green theraband x20 reps      Knee/Hip Exercises: Sidelying   Hip ABduction  AROM;Both;2 sets;10 reps      Modalities   Modalities  Electrical Stimulation;Moist Heat      Moist Heat Therapy   Number Minutes Moist Heat  15 Minutes    Moist Heat Location  Lumbar Spine      Electrical Stimulation   Electrical Stimulation Location  B low back    Electrical Stimulation Action  Pre-Mod    Electrical Stimulation Parameters  80-150 hz x15 min    Electrical Stimulation Goals  Pain  PT Long Term Goals - 12/25/17 2153      PT LONG TERM GOAL #1   Title  Patient will be independent with HEP    Time  6    Period  Weeks    Status  New      PT LONG TERM GOAL #2   Title  Patient will demonstrate 4+/5 or greater hip and knee MMT to improve stability during functional tasks.     Time  6    Period  Weeks    Status  New      PT LONG TERM GOAL #3   Title  Patient will report ability to perform ADLs and home activities with left hip pain less than 4/10.    Time  6    Period  Weeks    Status  New            Plan - 01/16/18 1514    Clinical Impression Statement  Patient presented in clinic with no complaints of any pain prior to PT. Patient progressed through LE and lumbar strengthening. Patient fatigued quickly with B SL hip abduction. Standing lumbar strengthening initiated due to patient's reports of discomfort with sweeping and mopping. Normal modalities response noted following removal of the modalities.     Rehab Potential  Good    PT Frequency  3x / week    PT Duration   6 weeks    PT Treatment/Interventions  ADLs/Self Care Home Management;Neuromuscular re-education;Electrical Stimulation;Cryotherapy;Moist Heat;Gait training;Stair training;Iontophoresis 4mg /ml Dexamethasone;Therapeutic activities;Ultrasound;Therapeutic exercise;Balance training;Passive range of motion;Manual techniques;Patient/family education    PT Next Visit Plan  Nustep or bike, core strengthening, LE strengthening modalities PRN for pain relief.    Consulted and Agree with Plan of Care  Patient       Patient will benefit from skilled therapeutic intervention in order to improve the following deficits and impairments:  Pain, Postural dysfunction, Decreased strength, Decreased range of motion, Difficulty walking  Visit Diagnosis: Pain in left hip  Muscle weakness (generalized)  Stiffness of left hip, not elsewhere classified  Low back pain, unspecified back pain laterality, unspecified chronicity, unspecified whether sciatica present     Problem List Patient Active Problem List   Diagnosis Date Noted  . DDD (degenerative disc disease), lumbar 11/13/2017  . Femoroacetabular impingement of both hips 11/13/2017  . Late effect of cerebrovascular accident (CVA) 10/25/2017  . Acute CVA (cerebrovascular accident) ([place]) 10/22/2017  . Tobacco use 10/22/2017  . Hyperglycemia 10/22/2017  . Depression 12/25/2016  . Abnormal drug screen 11/02/2016  . Pain management contract broken 03/09/2016  . Coronary artery calcification 01/18/2016  . Thoracic aortic atherosclerosis ([place]) 01/18/2016  . Chronic back pain 08/03/2015  . Opioid dependence ([place]) 08/03/2015  . Obesity (BMI 30-39.9) 03/09/2015  . Smoker 02/02/2015  . Erectile dysfunction 08/24/2014  . Vitamin D deficiency 04/18/2014  . Hyperlipidemia 04/18/2014  . COPD (chronic obstructive pulmonary disease) ([place]) 04/17/2014  . Weakness 08/22/2012  . Chronic pain syndrome 08/19/2012  . Adjustment disorder with mixed anxiety and  depressed mood 05/11/2012  . Trigeminal neuralgia 05/11/2012  . HTN (hypertension) 05/11/2012    Standley Brooking, PTA 01/16/2018, 3:23 PM  [facility] [address] [place], [place], [address] Phone: [phone]   Fax:  [phone]  Name: Matthew [name] MRN: [id number] Date of Birth: 1960-12-07

## 2018-01-21 NOTE — Telephone Encounter (Signed)
LMOM x 2 

## 2018-01-21 NOTE — Telephone Encounter (Signed)
Pt's son [name] is calling back [phone]

## 2018-01-21 NOTE — Telephone Encounter (Signed)
Called pt's son [name] and relayed the information to him in regards to pt's sleep study as stated by AO. Stated to [place] if they wanted to come in for an OV to discuss sleep study in more detail as well as decide treatment options, we could definitely schedule an OV.  [name] stated he would call pt and discuss this with him to see what pt wanted to do and then if pt decided he wanted to come in to discuss this further, they would call us back to schedule a f/u OV to discuss results. Nothing further needed.

## 2018-01-22 ENCOUNTER — Ambulatory Visit: Payer: Medicare Other | Admitting: Physical Therapy

## 2018-01-23 ENCOUNTER — Other Ambulatory Visit: Payer: Self-pay | Admitting: Family

## 2018-01-23 DIAGNOSIS — G500 Trigeminal neuralgia: Secondary | ICD-10-CM

## 2018-01-24 ENCOUNTER — Encounter: Payer: Medicare Other | Admitting: *Deleted

## 2018-01-30 ENCOUNTER — Ambulatory Visit: Payer: Medicare Other | Admitting: Physical Therapy

## 2018-01-30 ENCOUNTER — Encounter: Payer: Self-pay | Admitting: Physical Therapy

## 2018-01-30 DIAGNOSIS — M25552 Pain in left hip: Secondary | ICD-10-CM | POA: Diagnosis not present

## 2018-01-30 DIAGNOSIS — M6281 Muscle weakness (generalized): Secondary | ICD-10-CM

## 2018-01-30 DIAGNOSIS — M25652 Stiffness of left hip, not elsewhere classified: Secondary | ICD-10-CM

## 2018-01-30 DIAGNOSIS — M545 Low back pain, unspecified: Secondary | ICD-10-CM

## 2018-01-30 NOTE — Therapy (Signed)
[facility] [place], [place], [address] Phone: [phone]   Fax:  [phone]  Physical Therapy Treatment  Patient Details  Name: Matthew [name] MRN: [id number] Date of Birth: [birth year] Referring Provider (PT): Arther Abbott, MD   Encounter Date: 01/30/2018  PT End of Session - 01/30/18 1350    Visit Number  7    Number of Visits  18    Date for PT Re-Evaluation  02/12/18    Authorization Type  Progress note every 10th visit; KX modifier at 15th visit    PT Start Time  1347    PT Stop Time  1438    PT Time Calculation (min)  51 min    Activity Tolerance  Patient tolerated treatment well    Behavior During Therapy  [facility] for tasks assessed/performed       Past Medical History:  Diagnosis Date  . Abscess   . Anxiety   . Aphthous ulcer   . Arthritis   . Asthma   . Chronic bronchitis   . Chronic pain   . COPD (chronic obstructive pulmonary disease) ([place])   . CVA (cerebral vascular accident) ([place])   . Depression   . Hyperlipidemia   . Hypertension   . Trigeminal neuralgia   . Vertigo     Past Surgical History:  Procedure Laterality Date  . CHOLECYSTECTOMY    . HAND RECONSTRUCTION Right 1990's    There were no vitals filed for this visit.  Subjective Assessment - 01/30/18 1350    Subjective  Patient reports feeling good but is feeling weak.    Pertinent History  CVA 10/22/17, HTN, COPD    Limitations  Sitting;Standing;Walking;House hold activities    Patient Stated Goals  no pain         [facility] PT Assessment - 01/30/18 0001      Assessment   Medical Diagnosis  lumbar DDD; primary osteoarthritis of L hip    Referring Provider (PT)  Arther Abbott, MD    Next MD Visit  December, 2019    Prior Therapy  no                   [facility] Adult PT Treatment/Exercise - 01/30/18 0001      Lumbar Exercises: Standing   Row  Strengthening;Both;20 reps;10 reps    Row Limitations  Pink XTS    Shoulder Extension  Strengthening;Both;20 reps;Limitations;10 reps    Shoulder Extension Limitations  Pink XTS      Knee/Hip Exercises: Aerobic   Nustep  L4 x16 min      Knee/Hip Exercises: Machines for Strengthening   Cybex Knee Extension  20# 3 minutes    Cybex Knee Flexion  30# 3 minutes      Knee/Hip Exercises: Supine   Other Supine Knee/Hip Exercises  B clam green theraband x20 reps      Moist Heat Therapy   Number Minutes Moist Heat  15 Minutes    Moist Heat Location  Lumbar Spine      Electrical Stimulation   Electrical Stimulation Location  B low back    Electrical Stimulation Action  pre-mod    Electrical Stimulation Parameters  80-150 hz x15 mins    Electrical Stimulation Goals  Pain                  PT Long Term Goals - 01/30/18 1402      PT LONG TERM GOAL #1   Title  Patient will  be independent with HEP    Time  6    Period  Weeks    Status  On-going      PT LONG TERM GOAL #2   Title  Patient will demonstrate 4+/5 or greater hip and knee MMT to improve stability during functional tasks.     Time  6    Period  Weeks    Status  New      PT LONG TERM GOAL #3   Title  Patient will report ability to perform ADLs and home activities with left hip pain less than 4/10.    Time  6    Period  Weeks    Status  Achieved   no pain in left hip, but pain in back           Plan - 01/30/18 1421    Clinical Impression Statement  Patient was able to tolerate progression of treatment well with no reports of increased pain in low back. Patient's goals are ongoing at this time. Patient has not been performing HEP and stated he does other house work that feels like exercise. Patient instructed exercises provided work muscles that help stabilize the back and he should return to performing exercises. Normal response to modalities upon removal.     Clinical Presentation  Evolving    Clinical Decision Making  Moderate    Rehab Potential  Good    PT Frequency  3x  / week    PT Duration  6 weeks    PT Treatment/Interventions  ADLs/Self Care Home Management;Neuromuscular re-education;Electrical Stimulation;Cryotherapy;Moist Heat;Gait training;Stair training;Iontophoresis 4mg /ml Dexamethasone;Therapeutic activities;Ultrasound;Therapeutic exercise;Balance training;Passive range of motion;Manual techniques;Patient/family education    PT Next Visit Plan  Nustep or bike, core strengthening, LE strengthening modalities PRN for pain relief.    Consulted and Agree with Plan of Care  Patient       Patient will benefit from skilled therapeutic intervention in order to improve the following deficits and impairments:  Pain, Postural dysfunction, Decreased strength, Decreased range of motion, Difficulty walking  Visit Diagnosis: Pain in left hip  Muscle weakness (generalized)  Stiffness of left hip, not elsewhere classified  Low back pain, unspecified back pain laterality, unspecified chronicity, unspecified whether sciatica present     Problem List Patient Active Problem List   Diagnosis Date Noted  . DDD (degenerative disc disease), lumbar 11/13/2017  . Femoroacetabular impingement of both hips 11/13/2017  . Late effect of cerebrovascular accident (CVA) 10/25/2017  . Acute CVA (cerebrovascular accident) ([place]) 10/22/2017  . Tobacco use 10/22/2017  . Hyperglycemia 10/22/2017  . Depression 12/25/2016  . Abnormal drug screen 11/02/2016  . Pain management contract broken 03/09/2016  . Coronary artery calcification 01/18/2016  . Thoracic aortic atherosclerosis ([place]) 01/18/2016  . Chronic back pain 08/03/2015  . Opioid dependence ([name]) 08/03/2015  . Obesity (BMI 30-39.9) 03/09/2015  . Smoker 02/02/2015  . Erectile dysfunction 08/24/2014  . Vitamin D deficiency 04/18/2014  . Hyperlipidemia 04/18/2014  . COPD (chronic obstructive pulmonary disease) ([place]) 04/17/2014  . Weakness 08/22/2012  . Chronic pain syndrome 08/19/2012  . Adjustment disorder with  mixed anxiety and depressed mood 05/11/2012  . Trigeminal neuralgia 05/11/2012  . HTN (hypertension) 05/11/2012    Gabriela Eves, PT, DPT 01/30/2018, 2:38 PM  [facility] [address] [place], [place], [address] Phone: [phone]   Fax:  [phone]  Name: Matthew [name] MRN: [id number] Date of Birth: [birth year]

## 2018-02-02 ENCOUNTER — Other Ambulatory Visit: Payer: Self-pay | Admitting: Family

## 2018-02-04 NOTE — Telephone Encounter (Signed)
Last seen 11/13/17  [facility]

## 2018-02-07 ENCOUNTER — Ambulatory Visit: Payer: Medicare Other | Admitting: *Deleted

## 2018-02-07 DIAGNOSIS — M25552 Pain in left hip: Secondary | ICD-10-CM | POA: Diagnosis not present

## 2018-02-07 DIAGNOSIS — M545 Low back pain, unspecified: Secondary | ICD-10-CM

## 2018-02-07 DIAGNOSIS — M6281 Muscle weakness (generalized): Secondary | ICD-10-CM

## 2018-02-07 DIAGNOSIS — M25652 Stiffness of left hip, not elsewhere classified: Secondary | ICD-10-CM | POA: Diagnosis not present

## 2018-02-07 NOTE — Therapy (Addendum)
[facility] [place], [place], [address] Phone: [phone]   Fax:  [phone]  Physical Therapy Treatment PHYSICAL THERAPY DISCHARGE SUMMARY  Visits from Start of Care:8   Current functional level related to goals / functional outcomes: See below   Remaining deficits: See goals   Education / Equipment: HEP Plan: Patient agrees to discharge.  Patient goals were not met. Patient is being discharged due to not returning since the last visit.  ?????    Gabriela Eves, PT, DPT 12/01/19    Patient Details  Name: Matthew [name] MRN: [id number] Date of Birth: 1960/08/21 Referring Provider (PT): Arther Abbott, MD   Encounter Date: 02/07/2018  PT End of Session - 02/07/18 1413    Visit Number  8    Number of Visits  18    Date for PT Re-Evaluation  02/12/18    Authorization Type  Progress note every 10th visit; KX modifier at 15th visit    PT Start Time  1345    PT Stop Time  1423   )nly 2 units due to Pt's request for short RX   PT Time Calculation (min)  38 min       Past Medical History:  Diagnosis Date  . Abscess   . Anxiety   . Aphthous ulcer   . Arthritis   . Asthma   . Chronic bronchitis   . Chronic pain   . COPD (chronic obstructive pulmonary disease) ([place])   . CVA (cerebral vascular accident) ([place])   . Depression   . Hyperlipidemia   . Hypertension   . Trigeminal neuralgia   . Vertigo     Past Surgical History:  Procedure Laterality Date  . CHOLECYSTECTOMY    . HAND RECONSTRUCTION Right 1990's    There were no vitals filed for this visit.  Subjective Assessment - 02/07/18 1351    Subjective  Pt reports not feeling well today and wants to just do a little exercise today and heat/estim    Pertinent History  CVA 10/22/17, HTN, COPD    Limitations  Sitting;Standing;Walking;House hold activities    Patient Stated Goals  no pain    Currently in Pain?  Yes    Pain Score  4     Pain  Location  Back    Pain Orientation  Left;Lower    Pain Descriptors / Indicators  Discomfort    Pain Type  Chronic pain    Pain Onset  More than a month ago                       [facility] Adult PT Treatment/Exercise - 02/07/18 0001      Exercises   Exercises  Knee/Hip;Lumbar      Knee/Hip Exercises: Aerobic   Nustep  L4 x16 min      Modalities   Modalities  Electrical Stimulation;Moist Heat      Moist Heat Therapy   Number Minutes Moist Heat  20 Minutes    Moist Heat Location  Lumbar Spine      Electrical Stimulation   Electrical Stimulation Location  B low back    Electrical Stimulation Action  premod    Electrical Stimulation Parameters  80-'150hz'$  x 20 mins    Electrical Stimulation Goals  Pain                  PT Long Term Goals - 01/30/18 1402      PT LONG TERM GOAL #  1   Title  Patient will be independent with HEP    Time  6    Period  Weeks    Status  On-going      PT LONG TERM GOAL #2   Title  Patient will demonstrate 4+/5 or greater hip and knee MMT to improve stability during functional tasks.     Time  6    Period  Weeks    Status  New      PT LONG TERM GOAL #3   Title  Patient will report ability to perform ADLs and home activities with left hip pain less than 4/10.    Time  6    Period  Weeks    Status  Achieved   no pain in left hip, but pain in back           Plan - 02/07/18 1414    Clinical Impression Statement  Pt arrived today reporting not feeling good and would like to do just  nustep and heat and estim. He was able to perform therex without increased pain and had a normal response to modalities today. No new LTGs met due to Pt not feeling good.    Clinical Presentation  Evolving    Clinical Decision Making  Moderate    Rehab Potential  Good    PT Frequency  3x / week    PT Duration  6 weeks    PT Treatment/Interventions  ADLs/Self Care Home Management;Neuromuscular re-education;Electrical  Stimulation;Cryotherapy;Moist Heat;Gait training;Stair training;Iontophoresis '4mg'$ /ml Dexamethasone;Therapeutic activities;Ultrasound;Therapeutic exercise;Balance training;Passive range of motion;Manual techniques;Patient/family education    PT Next Visit Plan  Nustep or bike, core strengthening, LE strengthening modalities PRN for pain relief.    Consulted and Agree with Plan of Care  Patient       Patient will benefit from skilled therapeutic intervention in order to improve the following deficits and impairments:  Pain, Postural dysfunction, Decreased strength, Decreased range of motion, Difficulty walking  Visit Diagnosis: Pain in left hip  Muscle weakness (generalized)  Stiffness of left hip, not elsewhere classified  Low back pain, unspecified back pain laterality, unspecified chronicity, unspecified whether sciatica present     Problem List Patient Active Problem List   Diagnosis Date Noted  . DDD (degenerative disc disease), lumbar 11/13/2017  . Femoroacetabular impingement of both hips 11/13/2017  . Late effect of cerebrovascular accident (CVA) 10/25/2017  . Acute CVA (cerebrovascular accident) ([place]) 10/22/2017  . Tobacco use 10/22/2017  . Hyperglycemia 10/22/2017  . Depression 12/25/2016  . Abnormal drug screen 11/02/2016  . Pain management contract broken 03/09/2016  . Coronary artery calcification 01/18/2016  . Thoracic aortic atherosclerosis ([place]) 01/18/2016  . Chronic back pain 08/03/2015  . Opioid dependence ([place]) 08/03/2015  . Obesity (BMI 30-39.9) 03/09/2015  . Smoker 02/02/2015  . Erectile dysfunction 08/24/2014  . Vitamin D deficiency 04/18/2014  . Hyperlipidemia 04/18/2014  . COPD (chronic obstructive pulmonary disease) ([place]) 04/17/2014  . Weakness 08/22/2012  . Chronic pain syndrome 08/19/2012  . Adjustment disorder with mixed anxiety and depressed mood 05/11/2012  . Trigeminal neuralgia 05/11/2012  . HTN (hypertension) 05/11/2012    Matthew [name],CHRIS,  PTA 02/07/2018, 3:29 PM  [facility] [place], [place], [address] Phone: [phone]   Fax:  [phone]  Name: Matthew [name] MRN: [id number] Date of Birth: May 10, 1960

## 2018-02-21 ENCOUNTER — Ambulatory Visit (INDEPENDENT_AMBULATORY_CARE_PROVIDER_SITE_OTHER): Payer: Medicare Other | Admitting: Family

## 2018-02-21 ENCOUNTER — Encounter: Payer: Self-pay | Admitting: Family

## 2018-02-21 VITALS — BP 104/63 | HR 63 | Temp 97.2°F | Ht 69.0 in | Wt 249.4 lb

## 2018-02-21 DIAGNOSIS — J441 Chronic obstructive pulmonary disease with (acute) exacerbation: Secondary | ICD-10-CM | POA: Diagnosis not present

## 2018-02-21 MED ORDER — AZITHROMYCIN 250 MG PO TABS: ORAL_TABLET | ORAL | 0 refills | Status: DC

## 2018-02-21 MED ORDER — PREDNISONE 20 MG PO TABS: ORAL_TABLET | ORAL | 0 refills | Status: DC

## 2018-02-21 NOTE — Progress Notes (Signed)
Subjective:    Patient ID: Matthew [name], male    DOB: 1960/12/02, 58 y.o.   MRN: [id number]  Chief Complaint  Patient presents with  . Medical Management of Chronic Issues    three month recheck  . head congestion    Cough  This is a chronic problem. The current episode started in the past 7 days. The problem has been gradually worsening. The problem occurs every few minutes. The cough is productive of sputum. Associated symptoms include headaches, nasal congestion, postnasal drip, rhinorrhea and wheezing. Pertinent negatives include no chills, ear congestion, ear pain, fever or myalgias. The symptoms are aggravated by lying down. Risk factors for lung disease include smoking/tobacco exposure. He has tried rest and OTC cough suppressant for the symptoms. The treatment provided mild relief. His past medical history is significant for COPD.      Review of Systems  Constitutional: Negative for chills and fever.  HENT: Positive for postnasal drip and rhinorrhea. Negative for ear pain.   Respiratory: Positive for cough and wheezing.   Musculoskeletal: Negative for myalgias.  Neurological: Positive for headaches.  All other systems reviewed and are negative.      Objective:   Physical Exam Vitals signs reviewed.  Constitutional:      General: He is not in acute distress.    Appearance: He is well-developed.  HENT:     Head: Normocephalic.     Right Ear: External ear normal.     Left Ear: External ear normal.     Mouth/Throat:     Pharynx: Pharyngeal swelling, oropharyngeal exudate and posterior oropharyngeal erythema present.  Eyes:     General:        Right eye: No discharge.        Left eye: No discharge.     Pupils: Pupils are equal, round, and reactive to light.  Neck:     Musculoskeletal: Normal range of motion and neck supple.     Thyroid: No thyromegaly.  Cardiovascular:     Rate and Rhythm: Normal rate and regular rhythm.     Heart sounds: Normal heart sounds. No  murmur.  Pulmonary:     Effort: Pulmonary effort is normal. No respiratory distress.     Breath sounds: Decreased breath sounds and wheezing present.  Abdominal:     General: Bowel sounds are normal. There is no distension.     Palpations: Abdomen is soft.     Tenderness: There is no abdominal tenderness.  Musculoskeletal: Normal range of motion.        General: No tenderness.  Skin:    General: Skin is warm and dry.     Findings: No erythema or rash.  Neurological:     Mental Status: He is alert and oriented to person, place, and time.     Cranial Nerves: No cranial nerve deficit.     Deep Tendon Reflexes: Reflexes are normal and symmetric.  Psychiatric:        Behavior: Behavior normal.        Thought Content: Thought content normal.        Judgment: Judgment normal.       BP 104/63   Pulse 63   Temp (!) 97.2 F (36.2 C) (Oral)   Ht 5\' 9"  (1.753 m)   Wt 249 lb 6.4 oz (113.1 kg)   BMI 36.83 kg/m      Assessment & Plan:  Michall Noffke Kehres comes in today with chief complaint of head congestion  Diagnosis and orders addressed:  1. COPD exacerbation ([place]) - Take meds as prescribed - Use a cool mist humidifier  -Use saline nose sprays frequently -Force fluids -For any cough or congestion  Use plain Mucinex- regular strength or max strength is fine -For fever or aces or pains- take tylenol or ibuprofen. -Throat lozenges if help -RTO in 2 weeks for chronic follow up and recheck  - azithromycin (ZITHROMAX) 250 MG tablet; Take 500 mg once, then 250 mg for four days  Dispense: 6 tablet; Refill: 0 - predniSONE (DELTASONE) 20 MG tablet; Take 3 tabs daily for 1 week, then 2 tabs for 1 week, then 1 tab for one week  Dispense: 42 tablet; Refill: 0   Evelina Dun, FNP

## 2018-02-21 NOTE — Patient Instructions (Signed)
Chronic Obstructive Pulmonary Disease Chronic obstructive pulmonary disease (COPD) is a long-term (chronic) lung problem. When you have COPD, it is hard for air to get in and out of your lungs. Usually the condition gets worse over time, and your lungs will never return to normal. There are things you can do to keep yourself as healthy as possible.  Your doctor may treat your condition with: ? Medicines. ? Oxygen. ? Lung surgery.  Your doctor may also recommend: ? Rehabilitation. This includes steps to make your body work better. It may involve a team of specialists. ? Quitting smoking, if you smoke. ? Exercise and changes to your diet. ? Comfort measures (palliative care). Follow these instructions at home: Medicines  Take over-the-counter and prescription medicines only as told by your doctor.  Talk to your doctor before taking any cough or allergy medicines. You may need to avoid medicines that cause your lungs to be dry. Lifestyle  If you smoke, stop. Smoking makes the problem worse. If you need help quitting, ask your doctor.  Avoid being around things that make your breathing worse. This may include smoke, chemicals, and fumes.  Stay active, but remember to rest as well.  Learn and use tips on how to relax.  Make sure you get enough sleep. Most adults need at least 7 hours of sleep every night.  Eat healthy foods. Eat smaller meals more often. Rest before meals. Controlled breathing Learn and use tips on how to control your breathing as told by your doctor. Try:  Breathing in (inhaling) through your nose for 1 second. Then, pucker your lips and breath out (exhale) through your lips for 2 seconds.  Putting one hand on your belly (abdomen). Breathe in slowly through your nose for 1 second. Your hand on your belly should move out. Pucker your lips and breathe out slowly through your lips. Your hand on your belly should move in as you breathe out.  Controlled coughing Learn  and use controlled coughing to clear mucus from your lungs. Follow these steps: 1. Lean your head a little forward. 2. Breathe in deeply. 3. Try to hold your breath for 3 seconds. 4. Keep your mouth slightly open while coughing 2 times. 5. Spit any mucus out into a tissue. 6. Rest and do the steps again 1 or 2 times as needed. General instructions  Make sure you get all the shots (vaccines) that your doctor recommends. Ask your doctor about a flu shot and a pneumonia shot.  Use oxygen therapy and pulmonary rehabilitation if told by your doctor. If you need home oxygen therapy, ask your doctor if you should buy a tool to measure your oxygen level (oximeter).  Make a COPD action plan with your doctor. This helps you to know what to do if you feel worse than usual.  Manage any other conditions you have as told by your doctor.  Avoid going outside when it is very hot, cold, or humid.  Avoid people who have a sickness you can catch (contagious).  Keep all follow-up visits as told by your doctor. This is important. Contact a doctor if:  You cough up more mucus than usual.  There is a change in the color or thickness of the mucus.  It is harder to breathe than usual.  Your breathing is faster than usual.  You have trouble sleeping.  You need to use your medicines more often than usual.  You have trouble doing your normal activities such as getting dressed   or walking around the house. Get help right away if:  You have shortness of breath while resting.  You have shortness of breath that stops you from: ? Being able to talk. ? Doing normal activities.  Your chest hurts for longer than 5 minutes.  Your skin color is more blue than usual.  Your pulse oximeter shows that you have low oxygen for longer than 5 minutes.  You have a fever.  You feel too tired to breathe normally. Summary  Chronic obstructive pulmonary disease (COPD) is a long-term lung problem.  The way your  lungs work will never return to normal. Usually the condition gets worse over time. There are things you can do to keep yourself as healthy as possible.  Take over-the-counter and prescription medicines only as told by your doctor.  If you smoke, stop. Smoking makes the problem worse. This information is not intended to replace advice given to you by your health care provider. Make sure you discuss any questions you have with your health care provider. Document Released: 07/19/2007 Document Revised: 03/06/2016 Document Reviewed: 03/06/2016 Elsevier Interactive Patient Education  2019 [employer]  

## 2018-02-27 ENCOUNTER — Encounter (HOSPITAL_COMMUNITY): Payer: Self-pay

## 2018-02-27 ENCOUNTER — Emergency Department (HOSPITAL_COMMUNITY)
Admission: EM | Admit: 2018-02-27 | Discharge: 2018-02-27 | Disposition: A | Discharge status: Home / Self Care | Payer: Medicare Other | Attending: Emergency Medicine | Admitting: Emergency Medicine

## 2018-02-27 ENCOUNTER — Other Ambulatory Visit: Payer: Self-pay

## 2018-02-27 DIAGNOSIS — F17210 Nicotine dependence, cigarettes, uncomplicated: Secondary | ICD-10-CM | POA: Insufficient documentation

## 2018-02-27 DIAGNOSIS — I213 ST elevation (STEMI) myocardial infarction of unspecified site: Secondary | ICD-10-CM | POA: Diagnosis not present

## 2018-02-27 DIAGNOSIS — Z79899 Other long term (current) drug therapy: Secondary | ICD-10-CM | POA: Insufficient documentation

## 2018-02-27 DIAGNOSIS — I4891 Unspecified atrial fibrillation: Secondary | ICD-10-CM | POA: Diagnosis not present

## 2018-02-27 DIAGNOSIS — J449 Chronic obstructive pulmonary disease, unspecified: Secondary | ICD-10-CM | POA: Diagnosis not present

## 2018-02-27 DIAGNOSIS — I499 Cardiac arrhythmia, unspecified: Secondary | ICD-10-CM | POA: Diagnosis not present

## 2018-02-27 DIAGNOSIS — E876 Hypokalemia: Secondary | ICD-10-CM | POA: Diagnosis not present

## 2018-02-27 DIAGNOSIS — Z7982 Long term (current) use of aspirin: Secondary | ICD-10-CM | POA: Insufficient documentation

## 2018-02-27 DIAGNOSIS — I10 Essential (primary) hypertension: Secondary | ICD-10-CM | POA: Insufficient documentation

## 2018-02-27 DIAGNOSIS — E860 Dehydration: Secondary | ICD-10-CM | POA: Diagnosis not present

## 2018-02-27 DIAGNOSIS — I4510 Unspecified right bundle-branch block: Secondary | ICD-10-CM | POA: Diagnosis not present

## 2018-02-27 DIAGNOSIS — E1165 Type 2 diabetes mellitus with hyperglycemia: Secondary | ICD-10-CM | POA: Diagnosis not present

## 2018-02-27 DIAGNOSIS — Z8673 Personal history of transient ischemic attack (TIA), and cerebral infarction without residual deficits: Secondary | ICD-10-CM | POA: Insufficient documentation

## 2018-02-27 DIAGNOSIS — E785 Hyperlipidemia, unspecified: Secondary | ICD-10-CM | POA: Insufficient documentation

## 2018-02-27 LAB — BASIC METABOLIC PANEL
Anion gap: 14 (ref 5–15)
BUN: 8 mg/dL (ref 6–20)
CO2: 25 mmol/L (ref 22–32)
Calcium: 8.6 mg/dL — ABNORMAL LOW (ref 8.9–10.3)
Chloride: 91 mmol/L — ABNORMAL LOW (ref 98–111)
Creatinine, Ser: 0.67 mg/dL (ref 0.61–1.24)
GFR calc Af Amer: >60 mL/min (ref >60)
GFR calc non Af Amer: >60 mL/min (ref >60)
Glucose, Bld: 128 mg/dL — ABNORMAL HIGH (ref 70–99)
POTASSIUM: 3 mmol/L — AB (ref 3.5–5.1)
Sodium: 130 mmol/L — ABNORMAL LOW (ref 135–145)

## 2018-02-27 LAB — I-STAT TROPONIN, ED: Troponin i, poc: 0.03 ng/mL (ref 0.00–0.08)

## 2018-02-27 LAB — CBC WITH DIFFERENTIAL/PLATELET
ABS IMMATURE GRANULOCYTES: 0.76 10*3/uL — AB (ref 0.00–0.07)
Basophils Absolute: 0.1 10*3/uL (ref 0.0–0.1)
Basophils Relative: 1 %
Eosinophils Absolute: 0.1 10*3/uL (ref 0.0–0.5)
Eosinophils Relative: 0 %
HCT: 40.8 % (ref 39.0–52.0)
Hemoglobin: 14.8 g/dL (ref 13.0–17.0)
Immature Granulocytes: 4 %
LYMPHS ABS: 3.7 10*3/uL (ref 0.7–4.0)
Lymphocytes Relative: 21 %
MCH: 31.2 pg (ref 26.0–34.0)
MCHC: 36.3 g/dL — ABNORMAL HIGH (ref 30.0–36.0)
MCV: 85.9 fL (ref 80.0–100.0)
Monocytes Absolute: 1.3 10*3/uL — ABNORMAL HIGH (ref 0.1–1.0)
Monocytes Relative: 7 %
Neutro Abs: 12 10*3/uL — ABNORMAL HIGH (ref 1.7–7.7)
Neutrophils Relative %: 67 %
Platelets: 382 10*3/uL (ref 150–400)
RBC: 4.75 MIL/uL (ref 4.22–5.81)
RDW: 11.6 % (ref 11.5–15.5)
WBC: 17.9 10*3/uL — ABNORMAL HIGH (ref 4.0–10.5)
nRBC: 0 % (ref 0.0–0.2)

## 2018-02-27 LAB — MAGNESIUM: Magnesium: 1.9 mg/dL (ref 1.7–2.4)

## 2018-02-27 MED ORDER — POTASSIUM CHLORIDE CRYS ER 20 MEQ PO TBCR
40.0000 meq | EXTENDED_RELEASE_TABLET | Freq: Once | ORAL | Status: AC
  Administered 2018-02-27: 40 meq via ORAL
  Filled 2018-02-27: qty 2

## 2018-02-27 MED ORDER — SODIUM CHLORIDE 0.9 % IV BOLUS
1000.0000 mL | Freq: Once | INTRAVENOUS | Status: AC
  Administered 2018-02-27: 1000 mL via INTRAVENOUS

## 2018-02-27 MED ORDER — LORAZEPAM 1 MG PO TABS
1.0000 mg | ORAL_TABLET | Freq: Once | ORAL | Status: AC
  Administered 2018-02-27: 1 mg via ORAL
  Filled 2018-02-27: qty 1

## 2018-02-27 MED ORDER — METOPROLOL SUCCINATE ER 25 MG PO TB24: 25.0000 mg | ORAL_TABLET | Freq: Every day | ORAL | 0 refills | Status: DC

## 2018-02-27 MED ORDER — METOPROLOL SUCCINATE ER 25 MG PO TB24
25.0000 mg | ORAL_TABLET | Freq: Once | ORAL | Status: AC
  Administered 2018-02-27: 25 mg via ORAL
  Filled 2018-02-27: qty 1

## 2018-02-27 MED ORDER — METOPROLOL TARTRATE 5 MG/5ML IV SOLN
5.0000 mg | Freq: Once | INTRAVENOUS | Status: AC
  Administered 2018-02-27: 5 mg via INTRAVENOUS
  Filled 2018-02-27: qty 5

## 2018-02-27 NOTE — ED Notes (Signed)
Pt alert and oriented in NAD. Pt verbalized understanding of discharge instructions. 

## 2018-02-27 NOTE — ED Notes (Signed)
Called lab to add-on magnesium. Lab tech will process.

## 2018-02-27 NOTE — ED Notes (Signed)
Ambulated pt with pulse ox. Pt maintained 100% on RA although HR jumped up to 132.

## 2018-02-27 NOTE — ED Notes (Signed)
PA at bedside. She is seeking the EDP for the irregular heart rythmn

## 2018-02-27 NOTE — ED Triage Notes (Signed)
[facility] reports pt called for elevated hr. Norm is 50-60's, currently 110-140. Pt recently taken off xanax without taper.

## 2018-02-27 NOTE — Discharge Instructions (Signed)
It was my pleasure taking care of you today!  Continue taking your aspirin daily.   It is VERY important that you call the cardiology clinic to arrange follow up. I have sent a referral, but I recommend that you call first thing in the morning to schedule a follow up appointment.   Cut down on your caffeine intake.   Return to the ER immediately if you start having chest pain, shortness of breath, suddenly feel weak or as if your heart is racing, any new or worsening symptoms develop, any additional concerns.

## 2018-02-27 NOTE — ED Provider Notes (Signed)
[facility] EMERGENCY DEPARTMENT Provider Note   CSN: [id number] Arrival date & time: 02/27/18  1742     History   Chief Complaint No chief complaint on file.   HPI Matthew [name] is a 58 y.o. male.  The history is provided by the patient and medical records. No language interpreter was used.   Matthew [name] is a 58 y.o. male  with a PMH of HTN, HLD, COPD, prior CVA who presents to the Emergency Department complaining of elevated blood pressure and heart rate which he noticed around noon today. Patient states that he checks his blood pressure every day around this time and these values were very unusual. He does state that he "just doesn't feel good", but denies any specific symptoms.  Denies any chest pain, shortness of breath, palpitations, abdominal pain, nausea, vomiting, sudden sweating.  No medications prior to arrival for symptoms.  Takes 325 ASA daily which he took this morning.   Past Medical History:  Diagnosis Date  . Abscess   . Anxiety   . Aphthous ulcer   . Arthritis   . Asthma   . Chronic bronchitis   . Chronic pain   . COPD (chronic obstructive pulmonary disease) ([place])   . CVA (cerebral vascular accident) ([place])   . Depression   . Hyperlipidemia   . Hypertension   . Trigeminal neuralgia   . Vertigo     Patient Active Problem List   Diagnosis Date Noted  . DDD (degenerative disc disease), lumbar 11/13/2017  . Femoroacetabular impingement of both hips 11/13/2017  . Late effect of cerebrovascular accident (CVA) 10/25/2017  . Acute CVA (cerebrovascular accident) ([place]) 10/22/2017  . Tobacco use 10/22/2017  . Hyperglycemia 10/22/2017  . Depression 12/25/2016  . Abnormal drug screen 11/02/2016  . Pain management contract broken 03/09/2016  . Coronary artery calcification 01/18/2016  . Thoracic aortic atherosclerosis ([place]) 01/18/2016  . Chronic back pain 08/03/2015  . Opioid dependence ([place]) 08/03/2015  . Obesity (BMI 30-39.9) 03/09/2015  .  Smoker 02/02/2015  . Erectile dysfunction 08/24/2014  . Vitamin D deficiency 04/18/2014  . Hyperlipidemia 04/18/2014  . COPD (chronic obstructive pulmonary disease) ([place]) 04/17/2014  . Weakness 08/22/2012  . Chronic pain syndrome 08/19/2012  . Adjustment disorder with mixed anxiety and depressed mood 05/11/2012  . Trigeminal neuralgia 05/11/2012  . HTN (hypertension) 05/11/2012    Past Surgical History:  Procedure Laterality Date  . CHOLECYSTECTOMY    . HAND RECONSTRUCTION Right 1990's        Home Medications    Prior to Admission medications   Medication Sig Start Date End Date Taking? Authorizing Provider  albuterol (VENTOLIN HFA) 108 (90 Base) MCG/ACT inhaler INHALE 2 PUFFS INTO LUNGS EVERY 6 HOURS AS NEEDED FOR WHEEZE OR SHORTNESS OF BREATH Patient taking differently: Inhale 2 puffs into the lungs every 6 (six) hours as needed for shortness of breath.  09/24/17   Evelina Dun A, FNP  amLODipine (NORVASC) 10 MG tablet TAKE 1 TABLET BY MOUTH EVERY DAY 11/06/17   Evelina Dun A, FNP  aspirin 325 MG tablet Take 1 tablet (325 mg total) by mouth daily. 10/25/17   Kathie Dike, MD  atorvastatin (LIPITOR) 40 MG tablet Take 1 tablet (40 mg total) by mouth daily at 6 PM. 10/24/17   Kathie Dike, MD  azithromycin (ZITHROMAX) 250 MG tablet Take 500 mg once, then 250 mg for four days 02/21/18   Evelina Dun A, FNP  carbamazepine (TEGRETOL) 200 MG tablet TAKE 2  TABLETS BY MOUTH 4 TIMES A DAY 01/23/18   Hawks, Alyse Low A, FNP  cyclobenzaprine (FLEXERIL) 10 MG tablet Take 1 tablet (10 mg total) by mouth 3 (three) times daily. 11/10/17   Lily Kocher, PA-C  dexamethasone (DECADRON) 4 MG tablet TAKE 1 TABLET (4 MG TOTAL) BY MOUTH 2 (TWO) TIMES DAILY WITH A MEAL. 12/03/17   Hawks, Alyse Low A, FNP  fluticasone (FLONASE) 50 MCG/ACT nasal spray USE 2 SPRAYS INTO BOTH NOSTRILS ONCE DAILY Patient taking differently: Place 2 sprays into both nostrils daily.  10/18/17   [name], FNP    gabapentin (NEURONTIN) 600 MG tablet Take 1 tablet in the morning, 1 tablet in the afternoon, and 1.5 tablet at bedtime 01/09/18   Evelina Dun A, FNP  hydrOXYzine (ATARAX/VISTARIL) 25 MG tablet TAKE 1 TABLET (25 MG TOTAL) BY MOUTH 3 (THREE) TIMES DAILY AS NEEDED FOR ANXIETY. 02/04/18   Evelina Dun A, FNP  lisinopril-hydrochlorothiazide (ZESTORETIC) 20-12.5 MG tablet Take 2 tablets by mouth daily. 10/25/17   Evelina Dun A, FNP  metoprolol succinate (TOPROL-XL) 25 MG 24 hr tablet Take 1 tablet (25 mg total) by mouth daily. 02/27/18   Arista Kettlewell, Ozella Almond, PA-C  predniSONE (DELTASONE) 20 MG tablet Take 3 tabs daily for 1 week, then 2 tabs for 1 week, then 1 tab for one week 02/21/18   [name], FNP  TRELEGY ELLIPTA 100-62.5-25 MCG/INH AEPB TAKE 1 PUFF BY MOUTH EVERY DAY 12/04/17   [name], FNP    Family History Family History  Problem Relation Age of Onset  . Hypertension Mother   . Diabetes Mother   . Hypertension Father   . Diabetes Father   . Cancer Father   . Emphysema Father     Social History Social History   Tobacco Use  . Smoking status: Current Every Day Smoker    Packs/day: 2.00    Years: 40.00    Pack years: 80.00    Types: Cigarettes    Start date: 07/23/1975  . Smokeless tobacco: Never Used  Substance Use Topics  . Alcohol use: Yes    Comment: 08/21/2012 "quit drinking ~ 1985"  . Drug use: No     Allergies   Penicillins   Review of Systems Review of Systems  All other systems reviewed and are negative.    Physical Exam Updated Vital Signs BP 102/75   Pulse (!) 103 Comment: 90-114bpm  Temp 98.4 F (36.9 C) (Oral)   Resp 17   Ht 5\' 9"  (1.753 m)   Wt 108.9 kg   SpO2 98% Comment: 96-100%  BMI 35.44 kg/m   Physical Exam Vitals signs and nursing note reviewed.  Constitutional:      General: He is not in acute distress.    Appearance: He is well-developed.  HENT:     Head: Normocephalic and atraumatic.  Cardiovascular:      Heart sounds: No murmur.     Comments: Tachycardia, irregularly irregular. Pulmonary:     Effort: Pulmonary effort is normal. No respiratory distress.     Breath sounds: Normal breath sounds.  Abdominal:     General: There is no distension.     Palpations: Abdomen is soft.     Tenderness: There is no abdominal tenderness.  Skin:    General: Skin is warm and dry.  Neurological:     Mental Status: He is alert and oriented to person, place, and time.      ED Treatments / Results  Labs (all labs  ordered are listed, but only abnormal results are displayed) Labs Reviewed  CBC WITH DIFFERENTIAL/PLATELET - Abnormal; Notable for the following components:      Result Value   WBC 17.9 (*)    MCHC 36.3 (*)    Neutro Abs 12.0 (*)    Monocytes Absolute 1.3 (*)    Abs Immature Granulocytes 0.76 (*)    All other components within normal limits  BASIC METABOLIC PANEL - Abnormal; Notable for the following components:   Sodium 130 (*)    Potassium 3.0 (*)    Chloride 91 (*)    Glucose, Bld 128 (*)    Calcium 8.6 (*)    All other components within normal limits  MAGNESIUM  I-STAT TROPONIN, ED    EKG None  Radiology No results found.  Procedures Procedures (including critical care time)  CRITICAL CARE Performed by: Ozella Almond Sharan Mcenaney   Total critical care time: 40 minutes  Critical care time was exclusive of separately billable procedures and treating other patients. Afib with rvr - rate control.   Critical care was necessary to treat or prevent imminent or life-threatening deterioration.  Critical care was time spent personally by me on the following activities: development of treatment plan with patient and/or surrogate as well as nursing, discussions with consultants, evaluation of patient's response to treatment, examination of patient, obtaining history from patient or surrogate, ordering and performing treatments and interventions, ordering and review of laboratory  studies, ordering and review of radiographic studies, pulse oximetry and re-evaluation of patient's condition.   Medications Ordered in ED Medications  sodium chloride 0.9 % bolus 1,000 mL (0 mLs Intravenous Stopped 02/27/18 1919)  metoprolol tartrate (LOPRESSOR) injection 5 mg (5 mg Intravenous Given 02/27/18 1814)  potassium chloride SA (K-DUR,KLOR-CON) CR tablet 40 mEq (40 mEq Oral Given 02/27/18 1929)  sodium chloride 0.9 % bolus 1,000 mL (0 mLs Intravenous Stopped 02/27/18 2128)  metoprolol succinate (TOPROL-XL) 24 hr tablet 25 mg (25 mg Oral Given 02/27/18 2026)  LORazepam (ATIVAN) tablet 1 mg (1 mg Oral Given 02/27/18 2147)     Initial Impression / Assessment and Plan / ED Course  I have reviewed the triage vital signs and the nursing notes.  Pertinent labs & imaging results that were available during my care of the patient were reviewed by me and considered in my medical decision making (see chart for details).    Matthew [name] is a 58 y.o. male who presents to ED after checking his blood pressure as he does every day around lunch.  He states that his heart rate and blood pressure were much higher than usual.  He denies any specific symptoms.  No chest pain or shortness of breath.  EKG with what appears to be new onset A. Fib with HR in the 130's. Chadsvasc 3.   Given 5 of Lopressor.  On repeat evaluation, rate controlled.  K+ of 3.0 -replenished in ED today.  Ambulated in ED without having any symptoms.  He would like to go home today.  Son at bedside is agreeable to this and states that he will help with ensuring he has cardiology follow-up.  We spoke at length about reasons to return to the emergency department including development of chest pain, shortness of breath, sudden weakness or low threshold for any new symptom that may develop.  Continue 325 ASA.  Call cards in the morning.  Will start on Toprol XL at night.  All questions answered.      Patient seen  by and discussed with Dr.  Eulis Foster who agrees with treatment plan.   Final Clinical Impressions(s) / ED Diagnoses   Final diagnoses:  Atrial fibrillation, unspecified type [facility])  Hypokalemia    ED Discharge Orders         Ordered    Amb referral to AFIB Clinic     02/27/18 2141    metoprolol succinate (TOPROL-XL) 25 MG 24 hr tablet  Daily     02/27/18 2144           Capers Hagmann, Ozella Almond, PA-C 02/27/18 2202    Daleen Bo, MD 03/01/18 [phone]

## 2018-02-27 NOTE — ED Provider Notes (Signed)
  Face-to-face evaluation   History: He presents for evaluation of elevated heart rate which he found today when he checked his blood pressure.  He feels generally weak today but denies pain or trouble breathing.  History of hypertension.  Denies other cardiac symptoms.  He was recently taken off Xanax without a taper.  There are no other known modifying factors.  Physical exam: Alert overweight, somewhat uncomfortable.  Tachycardic without murmur.  Lungs clear anteriorly.  Extremities without edema.  Medical screening examination/treatment/procedure(s) were conducted as a shared visit with non-physician practitioner(s) and myself.  I personally evaluated the patient during the encounter    Daleen Bo, MD 03/01/18 [phone]

## 2018-03-04 ENCOUNTER — Ambulatory Visit (HOSPITAL_COMMUNITY)
Admission: RE | Admit: 2018-03-04 | Discharge: 2018-03-04 | Disposition: A | Discharge status: Home / Self Care | Payer: Medicare Other | Source: Ambulatory Visit | Attending: Nurse Practitioner | Admitting: Nurse Practitioner

## 2018-03-04 ENCOUNTER — Encounter (HOSPITAL_COMMUNITY): Payer: Self-pay | Admitting: Nurse Practitioner

## 2018-03-04 VITALS — BP 122/74 | HR 56 | Ht 69.0 in | Wt 242.0 lb

## 2018-03-04 DIAGNOSIS — Z9049 Acquired absence of other specified parts of digestive tract: Secondary | ICD-10-CM | POA: Diagnosis not present

## 2018-03-04 DIAGNOSIS — Z7952 Long term (current) use of systemic steroids: Secondary | ICD-10-CM | POA: Insufficient documentation

## 2018-03-04 DIAGNOSIS — E785 Hyperlipidemia, unspecified: Secondary | ICD-10-CM | POA: Insufficient documentation

## 2018-03-04 DIAGNOSIS — R9431 Abnormal electrocardiogram [ECG] [EKG]: Secondary | ICD-10-CM | POA: Diagnosis not present

## 2018-03-04 DIAGNOSIS — I480 Paroxysmal atrial fibrillation: Secondary | ICD-10-CM | POA: Diagnosis not present

## 2018-03-04 DIAGNOSIS — Z79899 Other long term (current) drug therapy: Secondary | ICD-10-CM | POA: Insufficient documentation

## 2018-03-04 DIAGNOSIS — I444 Left anterior fascicular block: Secondary | ICD-10-CM | POA: Insufficient documentation

## 2018-03-04 DIAGNOSIS — Z833 Family history of diabetes mellitus: Secondary | ICD-10-CM | POA: Insufficient documentation

## 2018-03-04 DIAGNOSIS — Z6835 Body mass index (BMI) 35.0-35.9, adult: Secondary | ICD-10-CM | POA: Diagnosis not present

## 2018-03-04 DIAGNOSIS — Z8673 Personal history of transient ischemic attack (TIA), and cerebral infarction without residual deficits: Secondary | ICD-10-CM | POA: Diagnosis not present

## 2018-03-04 DIAGNOSIS — J45909 Unspecified asthma, uncomplicated: Secondary | ICD-10-CM | POA: Insufficient documentation

## 2018-03-04 DIAGNOSIS — E669 Obesity, unspecified: Secondary | ICD-10-CM | POA: Insufficient documentation

## 2018-03-04 DIAGNOSIS — I10 Essential (primary) hypertension: Secondary | ICD-10-CM | POA: Diagnosis not present

## 2018-03-04 DIAGNOSIS — Z8249 Family history of ischemic heart disease and other diseases of the circulatory system: Secondary | ICD-10-CM | POA: Insufficient documentation

## 2018-03-04 DIAGNOSIS — Z7982 Long term (current) use of aspirin: Secondary | ICD-10-CM | POA: Insufficient documentation

## 2018-03-04 DIAGNOSIS — J449 Chronic obstructive pulmonary disease, unspecified: Secondary | ICD-10-CM | POA: Diagnosis not present

## 2018-03-04 DIAGNOSIS — Z880 Allergy status to penicillin: Secondary | ICD-10-CM | POA: Insufficient documentation

## 2018-03-04 DIAGNOSIS — I4510 Unspecified right bundle-branch block: Secondary | ICD-10-CM | POA: Insufficient documentation

## 2018-03-04 MED ORDER — WARFARIN SODIUM 7.5 MG PO TABS: 7.5000 mg | ORAL_TABLET | Freq: Every day | ORAL | 0 refills | Status: DC

## 2018-03-04 NOTE — Progress Notes (Signed)
Primary Care Physician: [name], FNP Primary Cardiologist: Dr Domenic Polite Referring Physician: [facility]   Matthew [name] is a 58 y.o. male with a history of paroxysmal atrial fibrillation who presents for consultation in the [facility].  The patient was initially diagnosed with atrial fibrillation 02/27/18 after presenting with symptoms of not feeling well overall with increased fatigue symptoms. His BP was elevated and review of ECG showed afib with RVR. K+ was 3.0 in the ED. At the time, he was being treated for an URI and was on antibiotics, decongestants, and prednisone. Started on Toprol 25 daily.  Today, he denies symptoms of palpitations, chest pain, shortness of breath, orthopnea, PND, lower extremity edema, dizziness, presyncope, syncope, snoring, daytime somnolence, bleeding, or neurologic sequela. The patient is tolerating medications without difficulties and is otherwise without complaint today.    Atrial Fibrillation Risk Factors:  he does not have a diagnosis of sleep apnea. Negative sleep study one month ago per patient.  he does not have a history of rheumatic fever.  he does not have a history of alcohol use.  he has a BMI of Body mass index is 35.74 kg/m.Marland Kitchen Filed Weights   03/04/18 1142  Weight: 109.8 kg     Atrial Fibrillation Management history:  Previous antiarrhythmic drugs: none  Previous cardioversions: none  Previous ablations: none  CHADS2VASC score: 3 (HTN, CVA)  Anticoagulation history: none, has been on ASA 325 mg since CVA   Past Medical History:  Diagnosis Date  . Abscess   . Anxiety   . Aphthous ulcer   . Arthritis   . Asthma   . Chronic bronchitis   . Chronic pain   . COPD (chronic obstructive pulmonary disease) ([place])   . CVA (cerebral vascular accident) ([place])   . Depression   . Hyperlipidemia   . Hypertension   . Trigeminal neuralgia   . Vertigo    Past Surgical History:  Procedure  Laterality Date  . CHOLECYSTECTOMY    . HAND RECONSTRUCTION Right 1990's    Current Outpatient Medications  Medication Sig Dispense Refill  . albuterol (VENTOLIN HFA) 108 (90 Base) MCG/ACT inhaler INHALE 2 PUFFS INTO LUNGS EVERY 6 HOURS AS NEEDED FOR WHEEZE OR SHORTNESS OF BREATH (Patient taking differently: Inhale 2 puffs into the lungs every 6 (six) hours as needed for shortness of breath. ) 18 Inhaler 3  . amLODipine (NORVASC) 10 MG tablet TAKE 1 TABLET BY MOUTH EVERY DAY 90 tablet 1  . aspirin 325 MG tablet Take 1 tablet (325 mg total) by mouth daily. 30 tablet 1  . atorvastatin (LIPITOR) 40 MG tablet Take 1 tablet (40 mg total) by mouth daily at 6 PM. 30 tablet 1  . azithromycin (ZITHROMAX) 250 MG tablet Take 500 mg once, then 250 mg for four days 6 tablet 0  . carbamazepine (TEGRETOL) 200 MG tablet TAKE 2 TABLETS BY MOUTH 4 TIMES A DAY 720 tablet 0  . cyclobenzaprine (FLEXERIL) 10 MG tablet Take 1 tablet (10 mg total) by mouth 3 (three) times daily. 20 tablet 0  . dexamethasone (DECADRON) 4 MG tablet TAKE 1 TABLET (4 MG TOTAL) BY MOUTH 2 (TWO) TIMES DAILY WITH A MEAL. 10 tablet 0  . fluticasone (FLONASE) 50 MCG/ACT nasal spray USE 2 SPRAYS INTO BOTH NOSTRILS ONCE DAILY (Patient taking differently: Place 2 sprays into both nostrils daily. ) 48 g 12  . gabapentin (NEURONTIN) 600 MG tablet Take 1 tablet in the morning, 1  tablet in the afternoon, and 1.5 tablet at bedtime 105 tablet 1  . hydrOXYzine (ATARAX/VISTARIL) 25 MG tablet TAKE 1 TABLET (25 MG TOTAL) BY MOUTH 3 (THREE) TIMES DAILY AS NEEDED FOR ANXIETY. 270 tablet 0  . lisinopril-hydrochlorothiazide (ZESTORETIC) 20-12.5 MG tablet Take 2 tablets by mouth daily. 180 tablet 3  . metoprolol succinate (TOPROL-XL) 25 MG 24 hr tablet Take 1 tablet (25 mg total) by mouth daily. 30 tablet 0  . predniSONE (DELTASONE) 20 MG tablet Take 3 tabs daily for 1 week, then 2 tabs for 1 week, then 1 tab for one week 42 tablet 0  . TRELEGY ELLIPTA  100-62.5-25 MCG/INH AEPB TAKE 1 PUFF BY MOUTH EVERY DAY 60 each 2   No current facility-administered medications for this encounter.     Allergies  Allergen Reactions  . Penicillins     Social History   Socioeconomic History  . Marital status: Legally Separated    Spouse name: Not on file  . Number of children: Not on file  . Years of education: Not on file  . Highest education level: Not on file  Occupational History  . Not on file  Social Needs  . Financial resource strain: Not on file  . Food insecurity:    Worry: Not on file    Inability: Not on file  . Transportation needs:    Medical: Not on file    Non-medical: Not on file  Tobacco Use  . Smoking status: Current Every Day Smoker    Packs/day: 2.00    Years: 40.00    Pack years: 80.00    Types: Cigarettes    Start date: 07/23/1975  . Smokeless tobacco: Never Used  Substance and Sexual Activity  . Alcohol use: Yes    Comment: 08/21/2012 "quit drinking ~ 1985"  . Drug use: No  . Sexual activity: Yes  Lifestyle  . Physical activity:    Days per week: Not on file    Minutes per session: Not on file  . Stress: Not on file  Relationships  . Social connections:    Talks on phone: Not on file    Gets together: Not on file    Attends religious service: Not on file    Active member of club or organization: Not on file    Attends meetings of clubs or organizations: Not on file    Relationship status: Not on file  . Intimate partner violence:    Fear of current or ex partner: Not on file    Emotionally abused: Not on file    Physically abused: Not on file    Forced sexual activity: Not on file  Other Topics Concern  . Not on file  Social History Narrative   Highest level of education: 9th grade    Family History  Problem Relation Age of Onset  . Hypertension Mother   . Diabetes Mother   . Hypertension Father   . Diabetes Father   . Cancer Father   . Emphysema Father    The patient does not have a  history of early familial atrial fibrillation or other arrhythmias.  ROS- All systems are reviewed and negative except as per the HPI above.  Physical Exam: Vitals:   03/04/18 1142  Weight: 109.8 kg  Height: 5\' 9"  (1.753 m)    GEN- The patient is well appearing obese male, alert and oriented x 3 today.   Head- normocephalic, atraumatic Eyes-  Sclera clear, conjunctiva pink Ears- hearing intact Oropharynx- clear  Neck- supple  Lungs- Clear to ausculation bilaterally, normal work of breathing Heart- Regular rate and rhythm, no murmurs, rubs or gallops  GI- soft, NT, ND, + BS Extremities- no clubbing, cyanosis, or edema MS- no significant deformity or atrophy Skin- no rash or lesion Psych- euthymic mood, full affect Neuro- strength and sensation are intact  Wt Readings from Last 3 Encounters:  03/04/18 109.8 kg  02/27/18 108.9 kg  02/21/18 113.1 kg    EKG today demonstrates sinus bradycardia, HR 59, RBBB, LAFB, PR 168, QRS 144, QTc 447 Echo 10/24/18 demonstrated Limited echo in setting of stroke. - Left ventricle: The cavity size was normal. Wall thickness was   increased in a pattern of moderate to severe LVH. Systolic   function was normal. The estimated ejection fraction was in the   range of 55% to 60%. Wall motion was normal; there were no   regional wall motion abnormalities. - Aortic valve: Valve area (VTI): 2.11 cm^2. Valve area (Vmax):   2.08 cm^2.  Epic records are reviewed at length today  Assessment and Plan:  1. Proxysmal atrial fibrillation New diagnosis in ER 02/27/18. Started on Toprol 25 daily. At the time, he was symptomatic with fatigue.  This patients CHA2DS2-VASc Score and unadjusted Ischemic Stroke Rate (% per year) is equal to 3.2 % stroke rate/year from a score of 3  Above score calculated as 1 point each if present [CHF, HTN, DM, Vascular=MI/PAD/Aortic Plaque, Age if 65-74, or Male] Above score calculated as 2 points each if present [Age > 75,  or Stroke/TIA/TE]   The patients left atrial size is 46 mm. A long discussion with the patient was had today regarding therapeutic strategies.  Extensive discussion of lifestyle modification including quit smoking, begin progressive daily aerobic exercise program and attempt to lose weight was also discussed.  Presently, our recommendations include: Start Coumadin 7.5 mg daily (unable to take other OACs due to seizure medication). Anticoagulation precautions discussed.  Continue metoprolol 25 mg daily Stop ASA 325 mg once INR therapeutic.    2. Obesity Body mass index is 35.74 kg/m. As above, lifestyle modification was discussed at length including regular exercise and weight reduction.  3. HTN Stable, no changes today   Follow up in Afib clinic in 4 weeks. Will arrange follow up in coumadin clinic.  Oliver Barre, [place] 03/04/2018 11:43 AM

## 2018-03-04 NOTE — Patient Instructions (Signed)
Start coumadin 7.5mg  once a day with supper starting on Wednesday.  Continue aspirin until you see coumadin clinic next Wednesday

## 2018-03-07 ENCOUNTER — Encounter: Payer: Self-pay | Admitting: Family

## 2018-03-07 ENCOUNTER — Ambulatory Visit (INDEPENDENT_AMBULATORY_CARE_PROVIDER_SITE_OTHER): Payer: Medicare Other | Admitting: Family

## 2018-03-07 VITALS — BP 126/76 | HR 102 | Temp 97.5°F | Ht 69.0 in | Wt 248.2 lb

## 2018-03-07 DIAGNOSIS — J42 Unspecified chronic bronchitis: Secondary | ICD-10-CM

## 2018-03-07 DIAGNOSIS — I4891 Unspecified atrial fibrillation: Secondary | ICD-10-CM | POA: Diagnosis not present

## 2018-03-07 DIAGNOSIS — G500 Trigeminal neuralgia: Secondary | ICD-10-CM | POA: Diagnosis not present

## 2018-03-07 DIAGNOSIS — Z09 Encounter for follow-up examination after completed treatment for conditions other than malignant neoplasm: Secondary | ICD-10-CM | POA: Diagnosis not present

## 2018-03-07 NOTE — Patient Instructions (Signed)
Atrial Fibrillation  Atrial fibrillation is a type of heartbeat that is irregular or fast (rapid). If you have this condition, your heart beats without any order. This makes it hard for your heart to pump blood in a normal way. Having this condition gives you more risk for stroke, heart failure, and other heart problems. Atrial fibrillation may start all of a sudden and then stop on its own, or it may become a long-lasting problem. What are the causes? This condition may be caused by heart conditions, such as:  High blood pressure.  Heart failure.  Heart valve disease.  Heart surgery. Other causes include:  Pneumonia.  Obstructive sleep apnea.  Lung cancer.  Thyroid disease.  Drinking too much alcohol. Sometimes the cause is not known. What increases the risk? You are more likely to develop this condition if:  You smoke.  You are older.  You have diabetes.  You are overweight.  You have a family history of this condition.  You exercise often and hard. What are the signs or symptoms? Common symptoms of this condition include:  A feeling like your heart is beating very fast.  Chest pain.  Feeling short of breath.  Feeling light-headed or weak.  Getting tired easily. Follow these instructions at home: Medicines  Take over-the-counter and prescription medicines only as told by your doctor.  If your doctor gives you a blood-thinning medicine, take it exactly as told. Taking too much of it can cause bleeding. Taking too little of it does not protect you against clots. Clots can cause a stroke. Lifestyle      Do not use any tobacco products. These include cigarettes, chewing tobacco, and e-cigarettes. If you need help quitting, ask your doctor.  Do not drink alcohol.  Do not drink beverages that have caffeine. These include coffee, soda, and tea.  Follow diet instructions as told by your doctor.  Exercise regularly as told by your doctor. General  instructions  If you have a condition that causes breathing to stop for a short period of time (apnea), treat it as told by your doctor.  Keep a healthy weight. Do not use diet pills unless your doctor says they are safe for you. Diet pills may make heart problems worse.  Keep all follow-up visits as told by your doctor. This is important. Contact a doctor if:  You notice a change in the speed, rhythm, or strength of your heartbeat.  You are taking a blood-thinning medicine and you see more bruising.  You get tired more easily when you move or exercise.  You have a sudden change in weight. Get help right away if:   You have pain in your chest or your belly (abdomen).  You have trouble breathing.  You have blood in your vomit, poop, or pee (urine).  You have any signs of a stroke. "BE FAST" is an easy way to remember the main warning signs: ? B - Balance. Signs are dizziness, sudden trouble walking, or loss of balance. ? E - Eyes. Signs are trouble seeing or a change in how you see. ? F - Face. Signs are sudden weakness or loss of feeling in the face, or the face or eyelid drooping on one side. ? A - Arms. Signs are weakness or loss of feeling in an arm. This happens suddenly and usually on one side of the body. ? S - Speech. Signs are sudden trouble speaking, slurred speech, or trouble understanding what people say. ? T - Time.  or loss of feeling in the face, or the face or eyelid drooping on one side.  ? A - Arms. Signs are weakness or loss of feeling in an arm. This happens suddenly and usually on one side of the body.  ? S - Speech. Signs are sudden trouble speaking, slurred speech, or trouble understanding what people say.  ? T - Time. Time to call emergency services. Write down what time symptoms started.  · You have other signs of a stroke, such as:  ? A sudden, very bad headache with no known cause.  ? Feeling sick to your stomach (nausea).  ? Throwing up (vomiting).  ? Jerky movements you cannot control (seizure).  These symptoms may be an emergency. Do not wait to see if the symptoms will go away. Get medical help right away. Call your local emergency services (911 in the [place] Do not drive yourself to the hospital.  Summary  · Atrial fibrillation is a type of heartbeat that is irregular  or fast (rapid).  · You are at higher risk of this condition if you smoke, are older, have diabetes, or are overweight.  · Follow your doctor's instructions about medicines, diet, exercise, and follow-up visits.  · Get help right away if you think that you have signs of a stroke.  This information is not intended to replace advice given to you by your health care provider. Make sure you discuss any questions you have with your health care provider.  Document Released: 11/09/2007 Document Revised: 03/23/2017 Document Reviewed: 03/23/2017  Elsevier Interactive Patient Education © 2019 [employer]

## 2018-03-07 NOTE — Progress Notes (Signed)
Subjective:    Patient ID: Matthew [name], male    DOB: [birth year], 58 y.o.   MRN: [id number]  Chief Complaint  Patient presents with  . COPD recheck   Pt presents to the office today for COPD recheck and new onset on A Fib. He went to the ED on 02/27/18 with new onset A FIb. He was started on warfarin and metoprolol. He saw his Cardiologists on 03/04/18. Per their note they started him on warfarin because he is unable to take other OAC's due to seizure medication.   This patients CHA2DS2-VASc Score and unadjusted Ischemic Stroke Rate (% per year) is equal to 3.2 % stroke rate/year from a score of 3  Denies any new SOB, palpitations, or chest pain.    Pt continues to smoke about a pack a day.  Cough  This is a chronic problem. The current episode started more than 1 year ago. The problem occurs every few minutes. The cough is non-productive. Associated symptoms include wheezing. Pertinent negatives include no chills or ear pain. The symptoms are aggravated by lying down.      Review of Systems  Constitutional: Negative for chills.  HENT: Negative for ear pain.   Respiratory: Positive for cough and wheezing.   All other systems reviewed and are negative.      Objective:   Physical Exam Vitals signs reviewed.  Constitutional:      General: He is not in acute distress.    Appearance: He is well-developed.  HENT:     Head: Normocephalic.     Right Ear: Tympanic membrane normal.     Left Ear: Tympanic membrane normal.     Nose: Mucosal edema present.     Mouth/Throat:     Pharynx: Posterior oropharyngeal erythema present.  Eyes:     General:        Right eye: No discharge.        Left eye: No discharge.     Pupils: Pupils are equal, round, and reactive to light.  Neck:     Musculoskeletal: Normal range of motion and neck supple.     Thyroid: No thyromegaly.  Cardiovascular:     Rate and Rhythm: Normal rate and regular rhythm.     Heart sounds: Normal heart sounds. No  murmur.  Pulmonary:     Effort: Pulmonary effort is normal. No respiratory distress.     Breath sounds: Decreased breath sounds present. No wheezing.     Comments: Coarse nonproductive cough Abdominal:     General: Bowel sounds are normal. There is no distension.     Palpations: Abdomen is soft.     Tenderness: There is no abdominal tenderness.  Musculoskeletal: Normal range of motion.        General: No tenderness.  Skin:    General: Skin is warm and dry.     Findings: No erythema or rash.  Neurological:     Mental Status: He is alert and oriented to person, place, and time.     Cranial Nerves: No cranial nerve deficit.     Deep Tendon Reflexes: Reflexes are normal and symmetric.  Psychiatric:        Behavior: Behavior normal.        Thought Content: Thought content normal.        Judgment: Judgment normal.       BP (!) 146/94   Pulse (!) 109   Temp (!) 97.5 F (36.4 C) (Oral)   Ht '5\' 9"'$  (1.753  m)   Wt 248 lb 3.2 oz (112.6 kg)   BMI 36.65 kg/m      Assessment & Plan:  Matthew [name] comes in today with chief complaint of COPD recheck and Atrial Fibrillation   Diagnosis and orders addressed:  1. Chronic bronchitis, unspecified chronic bronchitis type ([place]) - BMP8+EGFR - CBC with Differential/Platelet  2. Atrial fibrillation, unspecified type (HCC) - BMP8+EGFR - CBC with Differential/Platelet  3. Hospital discharge follow-up  - BMP8+EGFR - CBC with Differential/Platelet  Long discussion with patient. He states he does not wish to start taking the warfarin because he does not want to be limited on what he can eat. Discussed he could continue to eat what he wanted, but not vary from week to week. He states some times all he has to eat is "greens" and has to eat these all day. He states he has been stressed about this and wants to start medication that does not interfere with food.   I told him they could not start Xarelto because there is a possible reaction with  his tegretol for his Trigeminal neuralgia. He states his pain has been doing well, but is willing to change this if it meant he would not have to take the warfarin.   He also states getting transportation to get his INR checked weekly is going to be difficult, because his son is having to drive from [place] to here.   I am a little hesitant to change his medications today for his trigeminal neuralgia. He has been stable for the last 6 months with no pain.   Evelina Dun, FNP

## 2018-03-08 ENCOUNTER — Other Ambulatory Visit: Payer: Self-pay | Admitting: *Deleted

## 2018-03-08 DIAGNOSIS — D72828 Other elevated white blood cell count: Secondary | ICD-10-CM

## 2018-03-08 LAB — BMP8+EGFR
BUN/Creatinine Ratio: 17 (ref 9–20)
BUN: 12 mg/dL (ref 6–24)
CO2: 24 mmol/L (ref 20–29)
Calcium: 9.3 mg/dL (ref 8.7–10.2)
Chloride: 86 mmol/L — ABNORMAL LOW (ref 96–106)
Creatinine, Ser: 0.71 mg/dL — ABNORMAL LOW (ref 0.76–1.27)
GFR calc Af Amer: 120 mL/min/{1.73_m2} (ref >59)
GFR calc non Af Amer: 103 mL/min/{1.73_m2} (ref >59)
Glucose: 189 mg/dL — ABNORMAL HIGH (ref 65–99)
Potassium: 3.8 mmol/L (ref 3.5–5.2)
SODIUM: 130 mmol/L — AB (ref 134–144)

## 2018-03-08 LAB — CBC WITH DIFFERENTIAL/PLATELET
Basophils Absolute: 0.1 10*3/uL (ref 0.0–0.2)
Basos: 1 %
EOS (ABSOLUTE): 0 10*3/uL (ref 0.0–0.4)
Eos: 0 %
Hematocrit: 42.4 % (ref 37.5–51.0)
Hemoglobin: 14.5 g/dL (ref 13.0–17.7)
Immature Grans (Abs): 0.2 10*3/uL — ABNORMAL HIGH (ref 0.0–0.1)
Immature Granulocytes: 1 %
Lymphocytes Absolute: 3.4 10*3/uL — ABNORMAL HIGH (ref 0.7–3.1)
Lymphs: 21 %
MCH: 30.7 pg (ref 26.6–33.0)
MCHC: 34.2 g/dL (ref 31.5–35.7)
MCV: 90 fL (ref 79–97)
MONOS ABS: 0.7 10*3/uL (ref 0.1–0.9)
Monocytes: 5 %
Neutrophils Absolute: 11.7 10*3/uL — ABNORMAL HIGH (ref 1.4–7.0)
Neutrophils: 72 %
PLATELETS: 395 10*3/uL (ref 150–450)
RBC: 4.73 x10E6/uL (ref 4.14–5.80)
RDW: 11.8 % (ref 11.6–15.4)
WBC: 16.1 10*3/uL — ABNORMAL HIGH (ref 3.4–10.8)

## 2018-03-15 ENCOUNTER — Other Ambulatory Visit: Payer: Self-pay | Admitting: Family

## 2018-03-15 DIAGNOSIS — R0602 Shortness of breath: Secondary | ICD-10-CM

## 2018-03-15 DIAGNOSIS — J441 Chronic obstructive pulmonary disease with (acute) exacerbation: Secondary | ICD-10-CM

## 2018-03-18 ENCOUNTER — Telehealth: Payer: Self-pay | Admitting: Family

## 2018-03-18 DIAGNOSIS — J449 Chronic obstructive pulmonary disease, unspecified: Secondary | ICD-10-CM | POA: Diagnosis not present

## 2018-03-18 DIAGNOSIS — Z7982 Long term (current) use of aspirin: Secondary | ICD-10-CM | POA: Diagnosis not present

## 2018-03-18 DIAGNOSIS — Z8673 Personal history of transient ischemic attack (TIA), and cerebral infarction without residual deficits: Secondary | ICD-10-CM | POA: Diagnosis not present

## 2018-03-18 DIAGNOSIS — R0902 Hypoxemia: Secondary | ICD-10-CM | POA: Diagnosis not present

## 2018-03-18 DIAGNOSIS — F17210 Nicotine dependence, cigarettes, uncomplicated: Secondary | ICD-10-CM | POA: Diagnosis not present

## 2018-03-18 DIAGNOSIS — Z79899 Other long term (current) drug therapy: Secondary | ICD-10-CM | POA: Diagnosis not present

## 2018-03-18 DIAGNOSIS — I10 Essential (primary) hypertension: Secondary | ICD-10-CM | POA: Diagnosis not present

## 2018-03-18 DIAGNOSIS — F419 Anxiety disorder, unspecified: Secondary | ICD-10-CM | POA: Diagnosis not present

## 2018-03-18 DIAGNOSIS — F329 Major depressive disorder, single episode, unspecified: Secondary | ICD-10-CM | POA: Diagnosis not present

## 2018-03-18 DIAGNOSIS — E7800 Pure hypercholesterolemia, unspecified: Secondary | ICD-10-CM | POA: Diagnosis not present

## 2018-03-18 DIAGNOSIS — Z880 Allergy status to penicillin: Secondary | ICD-10-CM | POA: Diagnosis not present

## 2018-03-18 NOTE — Telephone Encounter (Signed)
Returned patient's call and patient sounded intoxicated and was upset because he is on hydroxyzine and he can not get is Xanax anymore. States the hydroxyzine gave him heart problems. Patient was cussing and his sentences were not going together. Last thing patient said was he would get it figured out and call us back.

## 2018-03-20 ENCOUNTER — Inpatient Hospital Stay (HOSPITAL_COMMUNITY)
Admission: EM | Admit: 2018-03-20 | Discharge: 2018-03-26 | DRG: 309 | Disposition: A | Discharge status: Home / Self Care | Payer: Medicare Other | Attending: Internal Medicine | Admitting: Internal Medicine

## 2018-03-20 ENCOUNTER — Emergency Department (HOSPITAL_COMMUNITY): Payer: Medicare Other

## 2018-03-20 ENCOUNTER — Other Ambulatory Visit: Payer: Self-pay

## 2018-03-20 ENCOUNTER — Encounter (HOSPITAL_COMMUNITY): Payer: Self-pay | Admitting: Emergency Medicine

## 2018-03-20 DIAGNOSIS — I4510 Unspecified right bundle-branch block: Secondary | ICD-10-CM | POA: Diagnosis not present

## 2018-03-20 DIAGNOSIS — E785 Hyperlipidemia, unspecified: Secondary | ICD-10-CM | POA: Diagnosis present

## 2018-03-20 DIAGNOSIS — E876 Hypokalemia: Secondary | ICD-10-CM | POA: Diagnosis not present

## 2018-03-20 DIAGNOSIS — I69354 Hemiplegia and hemiparesis following cerebral infarction affecting left non-dominant side: Secondary | ICD-10-CM | POA: Diagnosis not present

## 2018-03-20 DIAGNOSIS — Z8249 Family history of ischemic heart disease and other diseases of the circulatory system: Secondary | ICD-10-CM

## 2018-03-20 DIAGNOSIS — E669 Obesity, unspecified: Secondary | ICD-10-CM | POA: Diagnosis present

## 2018-03-20 DIAGNOSIS — Z91128 Patient's intentional underdosing of medication regimen for other reason: Secondary | ICD-10-CM

## 2018-03-20 DIAGNOSIS — I639 Cerebral infarction, unspecified: Secondary | ICD-10-CM | POA: Diagnosis present

## 2018-03-20 DIAGNOSIS — Z720 Tobacco use: Secondary | ICD-10-CM | POA: Diagnosis present

## 2018-03-20 DIAGNOSIS — I499 Cardiac arrhythmia, unspecified: Secondary | ICD-10-CM | POA: Diagnosis not present

## 2018-03-20 DIAGNOSIS — R001 Bradycardia, unspecified: Secondary | ICD-10-CM

## 2018-03-20 DIAGNOSIS — Z716 Tobacco abuse counseling: Secondary | ICD-10-CM

## 2018-03-20 DIAGNOSIS — B370 Candidal stomatitis: Secondary | ICD-10-CM | POA: Diagnosis not present

## 2018-03-20 DIAGNOSIS — I4891 Unspecified atrial fibrillation: Secondary | ICD-10-CM | POA: Diagnosis present

## 2018-03-20 DIAGNOSIS — I480 Paroxysmal atrial fibrillation: Secondary | ICD-10-CM | POA: Diagnosis not present

## 2018-03-20 DIAGNOSIS — I10 Essential (primary) hypertension: Secondary | ICD-10-CM | POA: Diagnosis not present

## 2018-03-20 DIAGNOSIS — Z833 Family history of diabetes mellitus: Secondary | ICD-10-CM

## 2018-03-20 DIAGNOSIS — Z6835 Body mass index (BMI) 35.0-35.9, adult: Secondary | ICD-10-CM

## 2018-03-20 DIAGNOSIS — Z825 Family history of asthma and other chronic lower respiratory diseases: Secondary | ICD-10-CM

## 2018-03-20 DIAGNOSIS — Z880 Allergy status to penicillin: Secondary | ICD-10-CM

## 2018-03-20 DIAGNOSIS — R000 Tachycardia, unspecified: Secondary | ICD-10-CM | POA: Diagnosis not present

## 2018-03-20 DIAGNOSIS — J449 Chronic obstructive pulmonary disease, unspecified: Secondary | ICD-10-CM | POA: Diagnosis not present

## 2018-03-20 DIAGNOSIS — R079 Chest pain, unspecified: Secondary | ICD-10-CM | POA: Diagnosis not present

## 2018-03-20 DIAGNOSIS — Z7982 Long term (current) use of aspirin: Secondary | ICD-10-CM

## 2018-03-20 DIAGNOSIS — F17210 Nicotine dependence, cigarettes, uncomplicated: Secondary | ICD-10-CM | POA: Diagnosis present

## 2018-03-20 DIAGNOSIS — G894 Chronic pain syndrome: Secondary | ICD-10-CM | POA: Diagnosis present

## 2018-03-20 DIAGNOSIS — G8194 Hemiplegia, unspecified affecting left nondominant side: Secondary | ICD-10-CM

## 2018-03-20 DIAGNOSIS — I700 Atherosclerosis of aorta: Secondary | ICD-10-CM | POA: Diagnosis present

## 2018-03-20 DIAGNOSIS — T45516A Underdosing of anticoagulants, initial encounter: Secondary | ICD-10-CM | POA: Diagnosis present

## 2018-03-20 DIAGNOSIS — Z79899 Other long term (current) drug therapy: Secondary | ICD-10-CM

## 2018-03-20 DIAGNOSIS — R0902 Hypoxemia: Secondary | ICD-10-CM | POA: Diagnosis not present

## 2018-03-20 DIAGNOSIS — Z9049 Acquired absence of other specified parts of digestive tract: Secondary | ICD-10-CM

## 2018-03-20 DIAGNOSIS — I495 Sick sinus syndrome: Secondary | ICD-10-CM | POA: Diagnosis present

## 2018-03-20 DIAGNOSIS — I119 Hypertensive heart disease without heart failure: Secondary | ICD-10-CM | POA: Diagnosis present

## 2018-03-20 DIAGNOSIS — G500 Trigeminal neuralgia: Secondary | ICD-10-CM | POA: Diagnosis present

## 2018-03-20 DIAGNOSIS — K120 Recurrent oral aphthae: Secondary | ICD-10-CM

## 2018-03-20 DIAGNOSIS — E871 Hypo-osmolality and hyponatremia: Secondary | ICD-10-CM | POA: Diagnosis present

## 2018-03-20 DIAGNOSIS — E860 Dehydration: Secondary | ICD-10-CM | POA: Diagnosis present

## 2018-03-20 DIAGNOSIS — I452 Bifascicular block: Secondary | ICD-10-CM | POA: Diagnosis present

## 2018-03-20 DIAGNOSIS — Z7951 Long term (current) use of inhaled steroids: Secondary | ICD-10-CM

## 2018-03-20 DIAGNOSIS — R002 Palpitations: Secondary | ICD-10-CM | POA: Diagnosis not present

## 2018-03-20 LAB — CBC
HCT: 42.3 % (ref 39.0–52.0)
HEMOGLOBIN: 14.9 g/dL (ref 13.0–17.0)
MCH: 29.7 pg (ref 26.0–34.0)
MCHC: 35.2 g/dL (ref 30.0–36.0)
MCV: 84.4 fL (ref 80.0–100.0)
Platelets: 226 10*3/uL (ref 150–400)
RBC: 5.01 MIL/uL (ref 4.22–5.81)
RDW: 11.4 % — ABNORMAL LOW (ref 11.5–15.5)
WBC: 9.3 10*3/uL (ref 4.0–10.5)
nRBC: 0 % (ref 0.0–0.2)

## 2018-03-20 LAB — BASIC METABOLIC PANEL
Anion gap: 12 (ref 5–15)
BUN: <5 mg/dL — ABNORMAL LOW (ref 6–20)
CO2: 24 mmol/L (ref 22–32)
Calcium: 8.6 mg/dL — ABNORMAL LOW (ref 8.9–10.3)
Chloride: 88 mmol/L — ABNORMAL LOW (ref 98–111)
Creatinine, Ser: 0.64 mg/dL (ref 0.61–1.24)
GFR calc Af Amer: >60 mL/min (ref >60)
GFR calc non Af Amer: >60 mL/min (ref >60)
Glucose, Bld: 185 mg/dL — ABNORMAL HIGH (ref 70–99)
POTASSIUM: 2.9 mmol/L — AB (ref 3.5–5.1)
Sodium: 124 mmol/L — ABNORMAL LOW (ref 135–145)

## 2018-03-20 LAB — TROPONIN I: Troponin I: <0.03 ng/mL (ref <0.03)

## 2018-03-20 MED ORDER — DILTIAZEM HCL-DEXTROSE 100-5 MG/100ML-% IV SOLN (PREMIX)
5.0000 mg/h | INTRAVENOUS | Status: DC
  Administered 2018-03-20: 5 mg/h via INTRAVENOUS
  Filled 2018-03-20 (×2): qty 100

## 2018-03-20 MED ORDER — POTASSIUM CHLORIDE 20 MEQ/15ML (10%) PO SOLN
60.0000 meq | Freq: Once | ORAL | Status: AC
  Administered 2018-03-21: 60 meq via ORAL
  Filled 2018-03-20: qty 45

## 2018-03-20 MED ORDER — DILTIAZEM LOAD VIA INFUSION
10.0000 mg | Freq: Once | INTRAVENOUS | Status: AC
  Administered 2018-03-20: 10 mg via INTRAVENOUS
  Filled 2018-03-20: qty 10

## 2018-03-20 MED ORDER — SODIUM CHLORIDE 0.9 % IV BOLUS
500.0000 mL | Freq: Once | INTRAVENOUS | Status: AC
  Administered 2018-03-20: 500 mL via INTRAVENOUS

## 2018-03-20 MED ORDER — ALUM & MAG HYDROXIDE-SIMETH 200-200-20 MG/5ML PO SUSP
30.0000 mL | Freq: Once | ORAL | Status: AC
  Administered 2018-03-20: 30 mL via ORAL
  Filled 2018-03-20: qty 30

## 2018-03-20 MED ORDER — POTASSIUM CHLORIDE 10 MEQ/100ML IV SOLN
10.0000 meq | INTRAVENOUS | Status: AC
  Administered 2018-03-21 (×2): 10 meq via INTRAVENOUS
  Filled 2018-03-20 (×3): qty 100

## 2018-03-20 MED ORDER — SODIUM CHLORIDE 0.9% FLUSH
3.0000 mL | Freq: Once | INTRAVENOUS | Status: AC
  Administered 2018-03-20: 3 mL via INTRAVENOUS

## 2018-03-20 MED ORDER — POTASSIUM CHLORIDE 10 MEQ/100ML IV SOLN
10.0000 meq | Freq: Once | INTRAVENOUS | Status: AC
  Administered 2018-03-20: 10 meq via INTRAVENOUS
  Filled 2018-03-20: qty 100

## 2018-03-20 MED ORDER — MAGNESIUM SULFATE IN D5W 1-5 GM/100ML-% IV SOLN
1.0000 g | Freq: Once | INTRAVENOUS | Status: AC
  Administered 2018-03-21: 1 g via INTRAVENOUS
  Filled 2018-03-20 (×2): qty 100
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
## 2018-03-20 NOTE — ED Notes (Signed)
Admitting doctor at the bedside 

## 2018-03-20 NOTE — ED Notes (Signed)
cardizem drip requested from pharmacy
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
## 2018-03-21 ENCOUNTER — Other Ambulatory Visit: Payer: Self-pay

## 2018-03-21 DIAGNOSIS — Z720 Tobacco use: Secondary | ICD-10-CM

## 2018-03-21 DIAGNOSIS — G8194 Hemiplegia, unspecified affecting left nondominant side: Secondary | ICD-10-CM | POA: Diagnosis not present

## 2018-03-21 DIAGNOSIS — I639 Cerebral infarction, unspecified: Secondary | ICD-10-CM | POA: Diagnosis present

## 2018-03-21 DIAGNOSIS — I4891 Unspecified atrial fibrillation: Secondary | ICD-10-CM

## 2018-03-21 DIAGNOSIS — E871 Hypo-osmolality and hyponatremia: Secondary | ICD-10-CM | POA: Diagnosis not present

## 2018-03-21 DIAGNOSIS — E876 Hypokalemia: Secondary | ICD-10-CM | POA: Diagnosis not present

## 2018-03-21 LAB — CBC
HCT: 39.4 % (ref 39.0–52.0)
Hemoglobin: 13.8 g/dL (ref 13.0–17.0)
MCH: 29.4 pg (ref 26.0–34.0)
MCHC: 35 g/dL (ref 30.0–36.0)
MCV: 84 fL (ref 80.0–100.0)
Platelets: 249 10*3/uL (ref 150–400)
RBC: 4.69 MIL/uL (ref 4.22–5.81)
RDW: 11.7 % (ref 11.5–15.5)
WBC: 8.2 10*3/uL (ref 4.0–10.5)
nRBC: 0 % (ref 0.0–0.2)

## 2018-03-21 LAB — BASIC METABOLIC PANEL
Anion gap: 10 (ref 5–15)
BUN: <5 mg/dL — ABNORMAL LOW (ref 6–20)
CHLORIDE: 94 mmol/L — AB (ref 98–111)
CO2: 23 mmol/L (ref 22–32)
Calcium: 8.2 mg/dL — ABNORMAL LOW (ref 8.9–10.3)
Creatinine, Ser: 0.63 mg/dL (ref 0.61–1.24)
GFR calc non Af Amer: >60 mL/min (ref >60)
Glucose, Bld: 138 mg/dL — ABNORMAL HIGH (ref 70–99)
Potassium: 4 mmol/L (ref 3.5–5.1)
Sodium: 127 mmol/L — ABNORMAL LOW (ref 135–145)

## 2018-03-21 LAB — TSH: TSH: 0.497 u[IU]/mL (ref 0.350–4.500)

## 2018-03-21 LAB — RAPID URINE DRUG SCREEN, HOSP PERFORMED
AMPHETAMINES: NOT DETECTED
Barbiturates: NOT DETECTED
Benzodiazepines: NOT DETECTED
Cocaine: NOT DETECTED
Opiates: NOT DETECTED
Tetrahydrocannabinol: NOT DETECTED

## 2018-03-21 LAB — MAGNESIUM: Magnesium: 1.6 mg/dL — ABNORMAL LOW (ref 1.7–2.4)

## 2018-03-21 LAB — TROPONIN I
Troponin I: 0.03 ng/mL (ref <0.03)
Troponin I: 0.03 ng/mL (ref <0.03)
Troponin I: <0.03 ng/mL (ref <0.03)

## 2018-03-21 LAB — PROTIME-INR
INR: 0.9
Prothrombin Time: 12.1 seconds (ref 11.4–15.2)

## 2018-03-21 LAB — OSMOLALITY: Osmolality: 269 mOsm/kg — ABNORMAL LOW (ref 275–295)

## 2018-03-21 LAB — SODIUM, URINE, RANDOM: Sodium, Ur: 71 mmol/L

## 2018-03-21 LAB — OSMOLALITY, URINE: Osmolality, Ur: 188 mOsm/kg — ABNORMAL LOW (ref 300–900)

## 2018-03-21 MED ORDER — NICOTINE 21 MG/24HR TD PT24
21.0000 mg | MEDICATED_PATCH | Freq: Every day | TRANSDERMAL | Status: DC
  Filled 2018-03-21 (×5): qty 1

## 2018-03-21 MED ORDER — COUMADIN BOOK
Freq: Once | Status: DC
  Filled 2018-03-21: qty 1

## 2018-03-21 MED ORDER — GABAPENTIN 600 MG PO TABS: 600.0000 mg | ORAL_TABLET | ORAL | Status: DC

## 2018-03-21 MED ORDER — FLUTICASONE FUROATE-VILANTEROL 100-25 MCG/INH IN AEPB
1.0000 | INHALATION_SPRAY | Freq: Every day | RESPIRATORY_TRACT | Status: DC
  Administered 2018-03-21 – 2018-03-26 (×6): 1 via RESPIRATORY_TRACT
  Filled 2018-03-21: qty 28

## 2018-03-21 MED ORDER — WARFARIN - PHARMACIST DOSING INPATIENT: Freq: Every day | Status: DC

## 2018-03-21 MED ORDER — MAGNESIUM SULFATE IN D5W 1-5 GM/100ML-% IV SOLN: 1.0000 g | Freq: Once | INTRAVENOUS | Status: DC

## 2018-03-21 MED ORDER — AMLODIPINE BESYLATE 10 MG PO TABS
10.0000 mg | ORAL_TABLET | Freq: Every day | ORAL | Status: DC
  Filled 2018-03-21: qty 1

## 2018-03-21 MED ORDER — HYDROXYZINE HCL 25 MG PO TABS: 25.0000 mg | ORAL_TABLET | Freq: Three times a day (TID) | ORAL | Status: DC | PRN

## 2018-03-21 MED ORDER — SODIUM CHLORIDE 0.9 % IV SOLN
INTRAVENOUS | Status: DC
  Administered 2018-03-21 – 2018-03-23 (×4): via INTRAVENOUS

## 2018-03-21 MED ORDER — HEPARIN SODIUM (PORCINE) 5000 UNIT/ML IJ SOLN
5000.0000 [IU] | Freq: Three times a day (TID) | INTRAMUSCULAR | Status: DC
  Administered 2018-03-21 – 2018-03-22 (×5): 5000 [IU] via SUBCUTANEOUS
  Filled 2018-03-21 (×5): qty 1

## 2018-03-21 MED ORDER — PATIENT'S GUIDE TO USING COUMADIN BOOK
Freq: Once | Status: DC
  Filled 2018-03-21 (×2): qty 1

## 2018-03-21 MED ORDER — FLUTICASONE-UMECLIDIN-VILANT 100-62.5-25 MCG/INH IN AEPB: 1.0000 | INHALATION_SPRAY | Freq: Every day | RESPIRATORY_TRACT | Status: DC

## 2018-03-21 MED ORDER — ATORVASTATIN CALCIUM 40 MG PO TABS
40.0000 mg | ORAL_TABLET | Freq: Every day | ORAL | Status: DC
  Administered 2018-03-21 – 2018-03-25 (×5): 40 mg via ORAL
  Filled 2018-03-21 (×5): qty 1

## 2018-03-21 MED ORDER — FLUTICASONE PROPIONATE 50 MCG/ACT NA SUSP
2.0000 | Freq: Every day | NASAL | Status: DC
  Administered 2018-03-21 – 2018-03-26 (×6): 2 via NASAL
  Filled 2018-03-21: qty 16

## 2018-03-21 MED ORDER — METOPROLOL SUCCINATE ER 25 MG PO TB24: 25.0000 mg | ORAL_TABLET | Freq: Every day | ORAL | Status: DC

## 2018-03-21 MED ORDER — SENNOSIDES-DOCUSATE SODIUM 8.6-50 MG PO TABS: 1.0000 | ORAL_TABLET | Freq: Every evening | ORAL | Status: DC | PRN

## 2018-03-21 MED ORDER — MAGNESIUM SULFATE IN D5W 1-5 GM/100ML-% IV SOLN
1.0000 g | Freq: Once | INTRAVENOUS | Status: AC
  Administered 2018-03-21: 1 g via INTRAVENOUS
  Filled 2018-03-21: qty 100

## 2018-03-21 MED ORDER — UMECLIDINIUM BROMIDE 62.5 MCG/INH IN AEPB
1.0000 | INHALATION_SPRAY | Freq: Every day | RESPIRATORY_TRACT | Status: DC
  Administered 2018-03-21 – 2018-03-26 (×6): 1 via RESPIRATORY_TRACT
  Filled 2018-03-21: qty 7

## 2018-03-21 MED ORDER — ACETAMINOPHEN 325 MG PO TABS
650.0000 mg | ORAL_TABLET | Freq: Four times a day (QID) | ORAL | Status: DC | PRN
  Administered 2018-03-24 – 2018-03-26 (×8): 650 mg via ORAL
  Filled 2018-03-21 (×10): qty 2

## 2018-03-21 MED ORDER — ACETAMINOPHEN 650 MG RE SUPP: 650.0000 mg | Freq: Four times a day (QID) | RECTAL | Status: DC | PRN

## 2018-03-21 MED ORDER — CARBAMAZEPINE 200 MG PO TABS
400.0000 mg | ORAL_TABLET | Freq: Four times a day (QID) | ORAL | Status: DC
  Administered 2018-03-21 – 2018-03-22 (×7): 400 mg via ORAL
  Filled 2018-03-21 (×7): qty 2

## 2018-03-21 MED ORDER — GABAPENTIN 300 MG PO CAPS
600.0000 mg | ORAL_CAPSULE | Freq: Two times a day (BID) | ORAL | Status: DC
  Administered 2018-03-21 – 2018-03-26 (×11): 600 mg via ORAL
  Filled 2018-03-21 (×11): qty 2

## 2018-03-21 MED ORDER — ALBUTEROL SULFATE (2.5 MG/3ML) 0.083% IN NEBU
2.5000 mg | INHALATION_SOLUTION | Freq: Four times a day (QID) | RESPIRATORY_TRACT | Status: DC | PRN
  Administered 2018-03-24 – 2018-03-25 (×2): 2.5 mg via RESPIRATORY_TRACT
  Filled 2018-03-21 (×2): qty 3

## 2018-03-21 MED ORDER — HYDROXYZINE HCL 10 MG PO TABS
10.0000 mg | ORAL_TABLET | Freq: Three times a day (TID) | ORAL | Status: DC | PRN
  Administered 2018-03-22 (×2): 10 mg via ORAL
  Filled 2018-03-21 (×2): qty 1

## 2018-03-21 MED ORDER — GABAPENTIN 300 MG PO CAPS
900.0000 mg | ORAL_CAPSULE | Freq: Every day | ORAL | Status: DC
  Administered 2018-03-21 (×2): 900 mg via ORAL
  Filled 2018-03-21 (×2): qty 3

## 2018-03-21 MED ORDER — ASPIRIN 325 MG PO TABS
325.0000 mg | ORAL_TABLET | Freq: Every day | ORAL | Status: DC
  Administered 2018-03-21 – 2018-03-26 (×6): 325 mg via ORAL
  Filled 2018-03-21 (×7): qty 1

## 2018-03-21 MED ORDER — METOPROLOL SUCCINATE ER 25 MG PO TB24
25.0000 mg | ORAL_TABLET | Freq: Every day | ORAL | Status: DC
  Administered 2018-03-23: 25 mg via ORAL
  Filled 2018-03-21 (×4): qty 1

## 2018-03-21 MED ORDER — WARFARIN SODIUM 7.5 MG PO TABS
7.5000 mg | ORAL_TABLET | Freq: Once | ORAL | Status: AC
  Administered 2018-03-21: 7.5 mg via ORAL
  Filled 2018-03-21: qty 1

## 2018-03-21 MED ORDER — HYDRALAZINE HCL 20 MG/ML IJ SOLN
5.0000 mg | INTRAMUSCULAR | Status: DC | PRN
  Filled 2018-03-21: qty 1
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
## 2018-03-21 NOTE — ED Notes (Signed)
Mag sulfate 1 gm  lolose from iv most of [facility] leaked pharmacy contacyed  They will send again
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
## 2018-03-21 NOTE — ED Notes (Signed)
The pts speech is very difficult to understand

## 2018-03-21 NOTE — Care Management Obs Status (Signed)
[place] NOTIFICATION   Patient Details  Name: Matthew [name] MRN: [id number] Date of Birth: 1960/05/25   Medicare Observation Status Notification Given:       Maryclare Labrador, RN 03/21/2018, 2:57 PM

## 2018-03-21 NOTE — ED Notes (Signed)
pts bp is bouncing around  I was prepared to give  Hydra;azine iv for bp but his bp is currently 120/80

## 2018-03-21 NOTE — Progress Notes (Signed)
While RN in room, patient had about a four second pause and then rhythm converted to sinus bradycardia with heart rate in the 50's.  Cardizem IV stopped.  Dr. Blaine Hamper with [facility] paged, returned call and was updated.  Order entered per Dr. Blaine Hamper.

## 2018-03-21 NOTE — Progress Notes (Signed)
ANTICOAGULATION CONSULT NOTE - Initial Consult  Pharmacy Consult for warfarin Indication: atrial fibrillation  Allergies  Allergen Reactions  . Penicillins Other (See Comments)    unknown    Patient Measurements: Height: 5\' 9"  (175.3 cm) Weight: 244 lb 4.8 oz (110.8 kg) IBW/kg (Calculated) : 70.7  Vital Signs: Temp: 97.8 F (36.6 C) (02/06 0534) Temp Source: Oral (02/06 0534) BP: 121/80 (02/06 1240) Pulse Rate: 51 (02/06 1240)  Labs: Recent Labs    03/20/18 2211 03/20/18 2332 03/21/18 0100 03/21/18 0643 03/21/18 1213  HGB 14.9  --   --  13.8  --   HCT 42.3  --   --  39.4  --   PLT 226  --   --  249  --   LABPROT  --  12.1  --   --   --   INR  --  0.90  --   --   --   CREATININE 0.64  --   --  0.63  --   TROPONINI <0.03  --  0.03* 0.03* <0.03    Estimated Creatinine Clearance: 123.4 mL/min (by C-G formula based on SCr of 0.63 mg/dL).  Assessment: CC/HPI: heart racing, palpitations  PMH: AFib not taking Coumadin  Anticoag: warfarin for afib - admit INR 0.9 Patient on carbamazepine as outpt so interacts with DOAC - initiating on warfarin until find alternative   Renal: SCr 0.63  Heme/Onc: H&H 13.8/39.4, Plt 249  Goal of Therapy:  INR 2-3 Monitor platelets by anticoagulation protocol: Yes   Plan:  Warfarin 7.5 mg x 1  Daily INR  Educate in AM  Levester Fresh, PharmD, BCPS, BCCCP Clinical Pharmacist [phone]  Please check AMION for all [place] numbers  03/21/2018 4:08 PM

## 2018-03-22 DIAGNOSIS — I119 Hypertensive heart disease without heart failure: Secondary | ICD-10-CM | POA: Diagnosis present

## 2018-03-22 DIAGNOSIS — J449 Chronic obstructive pulmonary disease, unspecified: Secondary | ICD-10-CM | POA: Diagnosis present

## 2018-03-22 DIAGNOSIS — Z9049 Acquired absence of other specified parts of digestive tract: Secondary | ICD-10-CM | POA: Diagnosis not present

## 2018-03-22 DIAGNOSIS — F17200 Nicotine dependence, unspecified, uncomplicated: Secondary | ICD-10-CM | POA: Diagnosis not present

## 2018-03-22 DIAGNOSIS — E785 Hyperlipidemia, unspecified: Secondary | ICD-10-CM | POA: Diagnosis present

## 2018-03-22 DIAGNOSIS — Z6835 Body mass index (BMI) 35.0-35.9, adult: Secondary | ICD-10-CM | POA: Diagnosis not present

## 2018-03-22 DIAGNOSIS — T45516A Underdosing of anticoagulants, initial encounter: Secondary | ICD-10-CM | POA: Diagnosis present

## 2018-03-22 DIAGNOSIS — G500 Trigeminal neuralgia: Secondary | ICD-10-CM

## 2018-03-22 DIAGNOSIS — Z91128 Patient's intentional underdosing of medication regimen for other reason: Secondary | ICD-10-CM | POA: Diagnosis not present

## 2018-03-22 DIAGNOSIS — I480 Paroxysmal atrial fibrillation: Secondary | ICD-10-CM | POA: Diagnosis not present

## 2018-03-22 DIAGNOSIS — Z720 Tobacco use: Secondary | ICD-10-CM | POA: Diagnosis not present

## 2018-03-22 DIAGNOSIS — I10 Essential (primary) hypertension: Secondary | ICD-10-CM | POA: Diagnosis not present

## 2018-03-22 DIAGNOSIS — E871 Hypo-osmolality and hyponatremia: Secondary | ICD-10-CM | POA: Diagnosis not present

## 2018-03-22 DIAGNOSIS — E860 Dehydration: Secondary | ICD-10-CM | POA: Diagnosis present

## 2018-03-22 DIAGNOSIS — I495 Sick sinus syndrome: Secondary | ICD-10-CM | POA: Diagnosis not present

## 2018-03-22 DIAGNOSIS — E876 Hypokalemia: Secondary | ICD-10-CM | POA: Diagnosis not present

## 2018-03-22 DIAGNOSIS — J438 Other emphysema: Secondary | ICD-10-CM | POA: Diagnosis not present

## 2018-03-22 DIAGNOSIS — I700 Atherosclerosis of aorta: Secondary | ICD-10-CM | POA: Diagnosis not present

## 2018-03-22 DIAGNOSIS — G894 Chronic pain syndrome: Secondary | ICD-10-CM | POA: Diagnosis present

## 2018-03-22 DIAGNOSIS — F17210 Nicotine dependence, cigarettes, uncomplicated: Secondary | ICD-10-CM | POA: Diagnosis present

## 2018-03-22 DIAGNOSIS — K120 Recurrent oral aphthae: Secondary | ICD-10-CM | POA: Diagnosis not present

## 2018-03-22 DIAGNOSIS — I69354 Hemiplegia and hemiparesis following cerebral infarction affecting left non-dominant side: Secondary | ICD-10-CM | POA: Diagnosis not present

## 2018-03-22 DIAGNOSIS — I4891 Unspecified atrial fibrillation: Secondary | ICD-10-CM | POA: Diagnosis not present

## 2018-03-22 DIAGNOSIS — R001 Bradycardia, unspecified: Secondary | ICD-10-CM | POA: Diagnosis not present

## 2018-03-22 DIAGNOSIS — R002 Palpitations: Secondary | ICD-10-CM | POA: Diagnosis not present

## 2018-03-22 DIAGNOSIS — B370 Candidal stomatitis: Secondary | ICD-10-CM | POA: Diagnosis not present

## 2018-03-22 DIAGNOSIS — Z716 Tobacco abuse counseling: Secondary | ICD-10-CM | POA: Diagnosis not present

## 2018-03-22 DIAGNOSIS — I452 Bifascicular block: Secondary | ICD-10-CM | POA: Diagnosis not present

## 2018-03-22 DIAGNOSIS — E669 Obesity, unspecified: Secondary | ICD-10-CM | POA: Diagnosis present

## 2018-03-22 DIAGNOSIS — Z8249 Family history of ischemic heart disease and other diseases of the circulatory system: Secondary | ICD-10-CM | POA: Diagnosis not present

## 2018-03-22 DIAGNOSIS — G8194 Hemiplegia, unspecified affecting left nondominant side: Secondary | ICD-10-CM | POA: Diagnosis not present

## 2018-03-22 LAB — COMPREHENSIVE METABOLIC PANEL
ALT: 19 U/L (ref 0–44)
AST: 15 U/L (ref 15–41)
Albumin: 3 g/dL — ABNORMAL LOW (ref 3.5–5.0)
Alkaline Phosphatase: 65 U/L (ref 38–126)
Anion gap: 6 (ref 5–15)
BUN: <5 mg/dL — ABNORMAL LOW (ref 6–20)
CHLORIDE: 97 mmol/L — AB (ref 98–111)
CO2: 25 mmol/L (ref 22–32)
Calcium: 7.9 mg/dL — ABNORMAL LOW (ref 8.9–10.3)
Creatinine, Ser: 0.62 mg/dL (ref 0.61–1.24)
GFR calc Af Amer: >60 mL/min (ref >60)
Glucose, Bld: 111 mg/dL — ABNORMAL HIGH (ref 70–99)
Potassium: 3.9 mmol/L (ref 3.5–5.1)
Sodium: 128 mmol/L — ABNORMAL LOW (ref 135–145)
Total Bilirubin: 0.4 mg/dL (ref 0.3–1.2)
Total Protein: 5.5 g/dL — ABNORMAL LOW (ref 6.5–8.1)

## 2018-03-22 LAB — MAGNESIUM: MAGNESIUM: 1.8 mg/dL (ref 1.7–2.4)

## 2018-03-22 LAB — PROTIME-INR
INR: 0.99
PROTHROMBIN TIME: 13 s (ref 11.4–15.2)

## 2018-03-22 MED ORDER — CARBAMAZEPINE 200 MG PO TABS
200.0000 mg | ORAL_TABLET | Freq: Four times a day (QID) | ORAL | Status: DC
  Administered 2018-03-22 – 2018-03-24 (×7): 200 mg via ORAL
  Filled 2018-03-22 (×7): qty 1

## 2018-03-22 MED ORDER — MAGNESIUM SULFATE 2 GM/50ML IV SOLN
2.0000 g | Freq: Once | INTRAVENOUS | Status: AC
  Administered 2018-03-22: 2 g via INTRAVENOUS
  Filled 2018-03-22: qty 50

## 2018-03-22 MED ORDER — ONDANSETRON HCL 4 MG/2ML IJ SOLN
4.0000 mg | Freq: Four times a day (QID) | INTRAMUSCULAR | Status: DC | PRN
  Administered 2018-03-22 (×2): 4 mg via INTRAVENOUS
  Filled 2018-03-22 (×3): qty 2

## 2018-03-22 MED ORDER — GABAPENTIN 300 MG PO CAPS
600.0000 mg | ORAL_CAPSULE | Freq: Every day | ORAL | Status: DC
  Administered 2018-03-22 – 2018-03-25 (×4): 600 mg via ORAL
  Filled 2018-03-22 (×4): qty 2

## 2018-03-22 MED ORDER — WARFARIN SODIUM 7.5 MG PO TABS: 7.5000 mg | ORAL_TABLET | Freq: Once | ORAL | Status: DC

## 2018-03-22 MED ORDER — LEVETIRACETAM IN NACL 1500 MG/100ML IV SOLN
1500.0000 mg | Freq: Once | INTRAVENOUS | Status: AC
  Administered 2018-03-22: 1500 mg via INTRAVENOUS
  Filled 2018-03-22: qty 100

## 2018-03-22 MED ORDER — ENOXAPARIN SODIUM 120 MG/0.8ML ~~LOC~~ SOLN
1.0000 mg/kg | Freq: Two times a day (BID) | SUBCUTANEOUS | Status: DC
  Administered 2018-03-22 – 2018-03-26 (×8): 110 mg via SUBCUTANEOUS
  Filled 2018-03-22 (×9): qty 0.74

## 2018-03-22 NOTE — Progress Notes (Signed)
ANTICOAGULATION CONSULT NOTE  Pharmacy Consult:  Coumadin Indication: atrial fibrillation  Allergies  Allergen Reactions  . Penicillins Other (See Comments)    unknown    Patient Measurements: Height: 5\' 9"  (175.3 cm) Weight: 246 lb (111.6 kg) IBW/kg (Calculated) : 70.7  Vital Signs: Temp: 97.5 F (36.4 C) (02/07 0504) Temp Source: Oral (02/07 0504) BP: 141/93 (02/07 0504) Pulse Rate: 59 (02/07 0504)  Labs: Recent Labs    03/20/18 2211 03/20/18 2332 03/21/18 0100 03/21/18 0643 03/21/18 1213 03/22/18 0434  HGB 14.9  --   --  13.8  --   --   HCT 42.3  --   --  39.4  --   --   PLT 226  --   --  249  --   --   LABPROT  --  12.1  --   --   --  13.0  INR  --  0.90  --   --   --  0.99  CREATININE 0.64  --   --  0.63  --  0.62  TROPONINI <0.03  --  0.03* 0.03* <0.03  --     Estimated Creatinine Clearance: 124 mL/min (by C-G formula based on SCr of 0.62 mg/dL).  Assessment: 35 YOM with history of Afib not taking his Coumadin as recommended by Cards.  Patient presented with AFib with RVR and Pharmacy consulted to dose Coumadin.  INR sub-therapeutic as expected; no bleeding reported.  Goal of Therapy:  INR 2-3 Monitor platelets by anticoagulation protocol: Yes   Plan:  Repeat Coumadin 7.5mg  PO today Heparin SQ until INR therapeutic Daily PT / INR   Jonne Rote D. Mina Marble, PharmD, BCPS, [place] 03/22/2018, 7:30 AM

## 2018-03-22 NOTE — Progress Notes (Addendum)
ANTICOAGULATION CONSULT NOTE  Pharmacy Consult:  Coumadin >> Lovenox Indication: atrial fibrillation  Allergies  Allergen Reactions  . Penicillins Other (See Comments)    unknown    Patient Measurements: Height: 5\' 9"  (175.3 cm) Weight: 246 lb (111.6 kg) IBW/kg (Calculated) : 70.7  Vital Signs: Temp: 97.5 F (36.4 C) (02/07 0504) Temp Source: Oral (02/07 0504) BP: 142/108 (02/07 0839) Pulse Rate: 82 (02/07 0738)  Labs: Recent Labs    03/20/18 2211 03/20/18 2332 03/21/18 0100 03/21/18 0643 03/21/18 1213 03/22/18 0434  HGB 14.9  --   --  13.8  --   --   HCT 42.3  --   --  39.4  --   --   PLT 226  --   --  249  --   --   LABPROT  --  12.1  --   --   --  13.0  INR  --  0.90  --   --   --  0.99  CREATININE 0.64  --   --  0.63  --  0.62  TROPONINI <0.03  --  0.03* 0.03* <0.03  --     Estimated Creatinine Clearance: 124 mL/min (by C-G formula based on SCr of 0.62 mg/dL).  Assessment: 32 YOM with history of Afib not taking his Coumadin PTA as recommended by Cards. Patient presented with AFib with RVR and hyponatremia. INR sub-therapeutic as expected at 0.99, CBC WNL, SCr stable WNL. No bleed issues documented.  Pharmacy consulted to transition to Lovenox x 3 days while patient tapering off Tegretol and transitioning to [place] for trigeminal neuralgia. Neurology recommended holding warfarin during this period and then transitioning to [place] when transition complete.  Last dose of warfarin was 2/6 PM (only dose received inpatient), also on heparin SQ prophylactic dose - last dose heparin SQ 5000 units was 2/7 at 1458. Ok to start Lovenox now as Paramedic.  Goal of Therapy:  INR 2-3 Monitor platelets by anticoagulation protocol: Yes   Plan:  D/c warfarin and heparin SQ Start Lovenox 110mg  (~1mg /kg) SQ Q12h Monitor CBC at least q72h while on Lovenox, s/sx bleeding F/u transition to DOAC after Tegretol to Keppra transition (planning 3 days per MD)  Elicia Lamp, PharmD,  BCPS Please check AMION for all [place] contact numbers Clinical Pharmacist 03/22/2018 5:06 PM

## 2018-03-22 NOTE — Progress Notes (Signed)
PROGRESS NOTE  Matthew [name] FUX:[id number] DOB: 1960-10-14 DOA: 03/20/2018 PCP: [name], FNP  HPI/Recap of past 24 hours:  Denies pain, no sob, has some bradycardia, but asymptomatic  He is a poor historian, he does not read or write   Assessment/Plan: Principal Problem:   Atrial fibrillation with RVR ([place]) Active Problems:   Trigeminal neuralgia   HTN (hypertension)   COPD (chronic obstructive pulmonary disease) (HCC)   Hyperlipidemia   Tobacco use   Hypokalemia   Hyponatremia   Stroke (HCC)   Left hemiparesis (HCC)   Hypomagnesemia  PAF: -presented with afib/RVR -he was recently saw by cardiology a few weeks ago who recommended coumadin which patient has not been on, he came to the ED due to not feeling well found to have Afib/RVR -he is started on cardizem drip, now converted to sinus rhythm with bradycardia -tsh 0.497 -cardiology consulted, input appreciated, will follow recommendation  Hyponatremia - from dehydration, continue ivf, repeat lab in am -also could be due to tegretol , taper off tegretol   hypokalemia/hypomagnesemia: -replaced, repeat lab in am, keep K.4, mag >2  HTN:  D/c norvasc Patient has bradycardia can not tolerate up titrate lopressor or adding ccb Consider transition from lopressor to coreg if bp elevated   H/o CVA in 10/2017  with mild left side weakness  -no acute issues, continue statin, will need anticoagulation for stroke prevention  H/o Trigeminal neuralgia  -controlled by tegretol and neurontin -will need to transition tegretol due to the need of NOAC and hyponatremia -case discussed with neurology Dr Earnestine Leys who recommend transition from tegretol to keppra; Load keppra 1500mg  x1 now, then start keppra 500mg  bid 12hr from loading dose taper tegretol to 200mg  QID start now, then change to 100mg  QID for 24hrs, then 100mg  BID for 24hr, then stop tegretol. Will keep on lovenox therapeutic dose while making the transition, can  start NOAC after taper off tegretol. Neurology dr Earnestine Leys input appreciated  Smoker: smoking cessation education provided I have discussed tobacco cessation with the patient.  I have counseled the patient regarding the negative impacts of continued tobacco use including but not limited to lung cancer, COPD, and cardiovascular disease.  I have discussed alternatives to tobacco and modalities that may help facilitate tobacco cessation including but not limited to biofeedback, hypnosis, and medications.  Total time spent with tobacco counseling was 4 minutes.    Code Status: full  Family Communication: patient , I called patient son and left message  Disposition Plan: home in 3 days after transition from tegretol to keppra   Consultants:  Cardiology  Neurology Dr Earnestine Leys on 2/7  Procedures:  none  Antibiotics:  none   Objective: BP (!) 142/108   Pulse 82   Temp (!) 97.5 F (36.4 C) (Oral)   Resp 15   Ht 5\' 9"  (1.753 m)   Wt 111.6 kg   SpO2 98%   BMI 36.33 kg/m   Intake/Output Summary (Last 24 hours) at 03/22/2018 1641 Last data filed at 03/22/2018 1458 Gross per 24 hour  Intake 2722.58 ml  Output 2150 ml  Net 572.58 ml   Filed Weights   03/20/18 2209 03/21/18 0244 03/22/18 0508  Weight: 111.1 kg 110.8 kg 111.6 kg    Exam: Patient is examined daily including today on 03/22/2018, exams remain the same as of yesterday except that has changed    General:  NAD  Cardiovascular: RRR  Respiratory: CTABL  Abdomen: Soft/ND/NT, positive BS  Musculoskeletal: No  Edema  Neuro: alert, oriented   Data Reviewed: Basic Metabolic Panel: Recent Labs  Lab 03/20/18 2211 03/20/18 2332 03/21/18 0643 03/22/18 0434  NA 124*  --  127* 128*  K 2.9*  --  4.0 3.9  CL 88*  --  94* 97*  CO2 24  --  23 25  GLUCOSE 185*  --  138* 111*  BUN <5*  --  <5* <5*  CREATININE 0.64  --  0.63 0.62  CALCIUM 8.6*  --  8.2* 7.9*  MG  --  1.6*  --  1.8   Liver Function Tests: Recent  Labs  Lab 03/22/18 0434  AST 15  ALT 19  ALKPHOS 65  BILITOT 0.4  PROT 5.5*  ALBUMIN 3.0*   No results for input(s): LIPASE, AMYLASE in the last 168 hours. No results for input(s): AMMONIA in the last 168 hours. CBC: Recent Labs  Lab 03/20/18 2211 03/21/18 0643  WBC 9.3 8.2  HGB 14.9 13.8  HCT 42.3 39.4  MCV 84.4 84.0  PLT 226 249   Cardiac Enzymes:   Recent Labs  Lab 03/20/18 2211 03/21/18 0100 03/21/18 0643 03/21/18 1213  TROPONINI <0.03 0.03* 0.03* <0.03   BNP (last 3 results) No results for input(s): BNP in the last 8760 hours.  ProBNP (last 3 results) No results for input(s): PROBNP in the last 8760 hours.  CBG: No results for input(s): GLUCAP in the last 168 hours.  No results found for this or any previous visit (from the past 240 hour(s)).   Studies: No results found.  Scheduled Meds: . aspirin  325 mg Oral Daily  . atorvastatin  40 mg Oral q1800  . carbamazepine  200 mg Oral QID  . coumadin book   Does not apply Once  . fluticasone  2 spray Each Nare Daily  . fluticasone furoate-vilanterol  1 puff Inhalation Daily   And  . umeclidinium bromide  1 puff Inhalation Daily  . gabapentin  600 mg Oral BID  . gabapentin  900 mg Oral QHS  . heparin  5,000 Units Subcutaneous Q8H  . metoprolol succinate  25 mg Oral Daily  . nicotine  21 mg Transdermal Daily    Continuous Infusions: . sodium chloride 75 mL/hr at 03/22/18 1459  . diltiazem (CARDIZEM) infusion Stopped (03/21/18 0305)  . levETIRAcetam       Time spent: 72mins, case discussed with cardiology/neurology, I have personally reviewed and interpreted on  03/22/2018 daily labs, tele strips, imagings as discussed above under date review session and assessment and plans.  I reviewed all nursing notes, pharmacy notes, consultant notes,  vitals, pertinent old records  I have discussed plan of care as described above with RN , patient on 03/22/2018   Florencia Reasons MD, PhD  [facility] Pager  [phone]. If 7PM-7AM, please contact night-coverage at [email or web address], password [facility] 03/22/2018, 4:41 PM  LOS: 0 days

## 2018-03-22 NOTE — Progress Notes (Signed)
Pt refused to take 600 mg of Gabapentin today. He said it can make him dizzy and nauseated.  Pt stated he wanted to take 300 mg. MD on call made aware. Pt also stated he takes 1 tablet of gabapentin 600 mg three times a day. He stated he dose not take 900 mg at bed time as directed. Md made aware and changed bed time dose of Gabapentin to 600 mg. Will cont to monitor pt.

## 2018-03-22 NOTE — Progress Notes (Addendum)
[facility] will sign off.   Medication Recommendations: as outlined in EP note Other recommendations (labs, testing, etc):  Needs INR f/u Follow up as an outpatient:    D/w EP team - no further role for EP needed. Patient is in NSR so no new cardiac recs either. Per my discussion with EP APP today, recommend patient keep previously scheduled AF clinic visit 2/21 as planned.  Have re-scheduled his coumadin clinic visit to Monday 2/10 at 10:30am in our [place] office.   Matthew Dunn PA-C    Addendum:    I spoke to Dr. Erlinda [name] of [facility] Matthew [name] is not a good coumadin candidate. He cannot read and has not been reliable in the past Elquis was contraindicated with carbamazepine so Dr. Erlinda [name] will DC the carbamazepine and start Kepra.  Will start him on Eliquis tonight which should be easier for him     Matthew Moores, MD  03/22/2018 4:38 PM    Matthew [name] Matthew [name],  Matthew [name] Matthew [name], [place]  [phone] Pager [phone] Phone: [phone]; Fax: [phone]

## 2018-03-23 DIAGNOSIS — J438 Other emphysema: Secondary | ICD-10-CM

## 2018-03-23 LAB — BASIC METABOLIC PANEL
Anion gap: 6 (ref 5–15)
BUN: <5 mg/dL — ABNORMAL LOW (ref 6–20)
CO2: 27 mmol/L (ref 22–32)
Calcium: 8.4 mg/dL — ABNORMAL LOW (ref 8.9–10.3)
Chloride: 102 mmol/L (ref 98–111)
Creatinine, Ser: 0.68 mg/dL (ref 0.61–1.24)
GFR calc Af Amer: >60 mL/min (ref >60)
GFR calc non Af Amer: >60 mL/min (ref >60)
Glucose, Bld: 116 mg/dL — ABNORMAL HIGH (ref 70–99)
Potassium: 5.2 mmol/L — ABNORMAL HIGH (ref 3.5–5.1)
Sodium: 135 mmol/L (ref 135–145)

## 2018-03-23 LAB — CBC
HCT: 37.6 % — ABNORMAL LOW (ref 39.0–52.0)
HEMOGLOBIN: 12.7 g/dL — AB (ref 13.0–17.0)
MCH: 29.6 pg (ref 26.0–34.0)
MCHC: 33.8 g/dL (ref 30.0–36.0)
MCV: 87.6 fL (ref 80.0–100.0)
Platelets: 218 10*3/uL (ref 150–400)
RBC: 4.29 MIL/uL (ref 4.22–5.81)
RDW: 11.9 % (ref 11.5–15.5)
WBC: 6.3 10*3/uL (ref 4.0–10.5)
nRBC: 0 % (ref 0.0–0.2)

## 2018-03-23 LAB — MAGNESIUM: Magnesium: 2 mg/dL (ref 1.7–2.4)

## 2018-03-23 MED ORDER — CARVEDILOL 3.125 MG PO TABS
3.1250 mg | ORAL_TABLET | Freq: Two times a day (BID) | ORAL | Status: DC
  Administered 2018-03-23 – 2018-03-26 (×5): 3.125 mg via ORAL
  Filled 2018-03-23 (×6): qty 1

## 2018-03-23 MED ORDER — NYSTATIN 100000 UNIT/ML MT SUSP
5.0000 mL | Freq: Four times a day (QID) | OROMUCOSAL | Status: DC
  Administered 2018-03-23 – 2018-03-26 (×13): 500000 [IU] via OROMUCOSAL
  Filled 2018-03-23 (×13): qty 5

## 2018-03-23 MED ORDER — LEVETIRACETAM 500 MG PO TABS
500.0000 mg | ORAL_TABLET | Freq: Two times a day (BID) | ORAL | Status: DC
  Administered 2018-03-23 – 2018-03-26 (×7): 500 mg via ORAL
  Filled 2018-03-23 (×7): qty 1

## 2018-03-23 MED ORDER — GUAIFENESIN ER 600 MG PO TB12
600.0000 mg | ORAL_TABLET | Freq: Two times a day (BID) | ORAL | Status: DC
  Administered 2018-03-23 – 2018-03-26 (×7): 600 mg via ORAL
  Filled 2018-03-23 (×7): qty 1

## 2018-03-23 MED ORDER — AMLODIPINE BESYLATE 5 MG PO TABS: 5.0000 mg | ORAL_TABLET | Freq: Every day | ORAL | Status: DC

## 2018-03-23 NOTE — Plan of Care (Signed)
  Problem: Education: Goal: Knowledge of General Education information will improve Description Including pain rating scale, medication(s)/side effects and non-pharmacologic comfort measures Outcome: Progressing   

## 2018-03-23 NOTE — Care Management (Signed)
Acknowledge consult for medication assistance. Patient has coverage w Medicare and Medicaid. No financial assistance available. If needs arise for a specific medication that has a manufacturer coupon, please re consult.

## 2018-03-23 NOTE — Progress Notes (Addendum)
PROGRESS NOTE  Matthew [name] [id number] DOB: December 08, 1960 DOA: 03/20/2018 PCP: [name], FNP  HPI/Recap of past 24 hours:  Report vomitedx1 last night but night RN did not report this , no sure the detail, patient is not a good historian,   He initially told me he has no pain, after further interrogation, he reports he has mouth pain, he reports it happens some time at home as well  no fever, no sob Sinus brady in the high 50's, bp stable   He is a poor historian, he does not read or write   Assessment/Plan: Principal Problem:   Atrial fibrillation with RVR ([place]) Active Problems:   Trigeminal neuralgia   HTN (hypertension)   COPD (chronic obstructive pulmonary disease) (HCC)   Hyperlipidemia   Tobacco use   Hypokalemia   Hyponatremia   Stroke ([place])   Left hemiparesis (HCC)   Hypomagnesemia  PAF: -presented with afib/RVR -he was recently saw by cardiology a few weeks ago who recommended coumadin which patient has not been on, he came to the ED due to not feeling well found to have Afib/RVR -he is started on cardizem drip, now converted to sinus rhythm with bradycardia -tsh 0.497 -cardiology consulted, input appreciated, will follow recommendation  Hyponatremia - from dehydration, continue ivf, repeat lab in am -also could be due to tegretol , taper off tegretol  -sodium improving  hypokalemia/hypomagnesemia: -replaced, repeat lab in am, keep K.4, mag >2  HTN:  Patient has bradycardia can not tolerate up titrate lopressor or adding ccb  transition from lopressor to coreg , now bp elevated, may need to restart norvasc if bp still elevated   H/o CVA in 10/2017  with mild left side weakness  -no acute issues, continue statin, will need anticoagulation for stroke prevention  H/o Trigeminal neuralgia  -controlled by tegretol and neurontin -will need to transition tegretol due to the need of NOAC and hyponatremia -case discussed with neurology Dr Earnestine Leys who  recommend transition from tegretol to keppra; Load keppra 1500mg  x1 now, then start keppra 500mg  bid 12hr from loading dose taper tegretol to 200mg  QID start now, then change to 100mg  QID for 24hrs, then 100mg  BID for 24hr, then stop tegretol. Will keep on lovenox therapeutic dose while making the transition, can start NOAC after taper off tegretol. Neurology dr Earnestine Leys input appreciated  Smoker: smoking cessation education provided I have discussed tobacco cessation with the patient.  I have counseled the patient regarding the negative impacts of continued tobacco use including but not limited to lung cancer, COPD, and cardiovascular disease.  I have discussed alternatives to tobacco and modalities that may help facilitate tobacco cessation including but not limited to biofeedback, hypnosis, and medications.  Total time spent with tobacco counseling was 4 minutes.   Oral thrush: Start topical nystatin solution   Code Status: full  Family Communication: patient  And son  Disposition Plan: home in 3 days after transition from tegretol to keppra   Consultants:  Cardiology  Neurology Dr Earnestine Leys on 2/7  Procedures:  none  Antibiotics:  none   Objective: BP 139/87 (BP Location: Right Arm)   Pulse (!) 50   Temp (!) 97.5 F (36.4 C) (Oral)   Resp 20   Ht 5\' 9"  (1.753 m)   Wt 110.3 kg   SpO2 93%   BMI 35.91 kg/m   Intake/Output Summary (Last 24 hours) at 03/23/2018 0855 Last data filed at 03/23/2018 0746 Gross per 24 hour  Intake 1826.46  ml  Output 3950 ml  Net -2123.54 ml   Filed Weights   03/21/18 0244 03/22/18 0508 03/23/18 0338  Weight: 110.8 kg 111.6 kg 110.3 kg    Exam: Patient is examined daily including today on 03/23/2018, exams remain the same as of yesterday except that has changed    General:  NAD  Cardiovascular: RRR  Respiratory: CTABL  Abdomen: Soft/ND/NT, positive BS  Musculoskeletal: No Edema  Neuro: alert, oriented   Data Reviewed: Basic  Metabolic Panel: Recent Labs  Lab 03/20/18 2211 03/20/18 2332 03/21/18 0643 03/22/18 0434 03/23/18 0528  NA 124*  --  127* 128* 135  K 2.9*  --  4.0 3.9 5.2*  CL 88*  --  94* 97* 102  CO2 24  --  23 25 27   GLUCOSE 185*  --  138* 111* 116*  BUN <5*  --  <5* <5* <5*  CREATININE 0.64  --  0.63 0.62 0.68  CALCIUM 8.6*  --  8.2* 7.9* 8.4*  MG  --  1.6*  --  1.8 2.0   Liver Function Tests: Recent Labs  Lab 03/22/18 0434  AST 15  ALT 19  ALKPHOS 65  BILITOT 0.4  PROT 5.5*  ALBUMIN 3.0*   No results for input(s): LIPASE, AMYLASE in the last 168 hours. No results for input(s): AMMONIA in the last 168 hours. CBC: Recent Labs  Lab 03/20/18 2211 03/21/18 0643 03/23/18 0528  WBC 9.3 8.2 6.3  HGB 14.9 13.8 12.7*  HCT 42.3 39.4 37.6*  MCV 84.4 84.0 87.6  PLT 226 249 218   Cardiac Enzymes:   Recent Labs  Lab 03/20/18 2211 03/21/18 0100 03/21/18 0643 03/21/18 1213  TROPONINI <0.03 0.03* 0.03* <0.03   BNP (last 3 results) No results for input(s): BNP in the last 8760 hours.  ProBNP (last 3 results) No results for input(s): PROBNP in the last 8760 hours.  CBG: No results for input(s): GLUCAP in the last 168 hours.  No results found for this or any previous visit (from the past 240 hour(s)).   Studies: No results found.  Scheduled Meds: . aspirin  325 mg Oral Daily  . atorvastatin  40 mg Oral q1800  . carbamazepine  200 mg Oral QID  . enoxaparin (LOVENOX) injection  1 mg/kg Subcutaneous Q12H  . fluticasone  2 spray Each Nare Daily  . fluticasone furoate-vilanterol  1 puff Inhalation Daily   And  . umeclidinium bromide  1 puff Inhalation Daily  . gabapentin  600 mg Oral BID  . gabapentin  600 mg Oral QHS  . levETIRAcetam  500 mg Oral BID  . metoprolol succinate  25 mg Oral Daily  . nicotine  21 mg Transdermal Daily    Continuous Infusions: . sodium chloride 75 mL/hr at 03/23/18 0130     Time spent: 67mins,  I have personally reviewed and interpreted  on  03/23/2018 daily labs, tele strips, imagings as discussed above under date review session and assessment and plans.  I reviewed all nursing notes, pharmacy notes, consultant notes,  vitals, pertinent old records  I have discussed plan of care as described above with RN , patient on 03/23/2018   Florencia Reasons MD, PhD  [facility] Pager [phone]. If 7PM-7AM, please contact night-coverage at [email or web address], password [facility] 03/23/2018, 8:55 AM  LOS: 1 day

## 2018-03-24 LAB — CBC
HCT: 36.1 % — ABNORMAL LOW (ref 39.0–52.0)
Hemoglobin: 12.3 g/dL — ABNORMAL LOW (ref 13.0–17.0)
MCH: 30.1 pg (ref 26.0–34.0)
MCHC: 34.1 g/dL (ref 30.0–36.0)
MCV: 88.5 fL (ref 80.0–100.0)
Platelets: 208 10*3/uL (ref 150–400)
RBC: 4.08 MIL/uL — ABNORMAL LOW (ref 4.22–5.81)
RDW: 12 % (ref 11.5–15.5)
WBC: 6.2 10*3/uL (ref 4.0–10.5)
nRBC: 0 % (ref 0.0–0.2)

## 2018-03-24 LAB — HEPATIC FUNCTION PANEL
ALT: 22 U/L (ref 0–44)
AST: 15 U/L (ref 15–41)
Albumin: 3.3 g/dL — ABNORMAL LOW (ref 3.5–5.0)
Alkaline Phosphatase: 79 U/L (ref 38–126)
BILIRUBIN DIRECT: 0.1 mg/dL (ref 0.0–0.2)
BILIRUBIN TOTAL: 0.7 mg/dL (ref 0.3–1.2)
Indirect Bilirubin: 0.6 mg/dL (ref 0.3–0.9)
Total Protein: 5.9 g/dL — ABNORMAL LOW (ref 6.5–8.1)

## 2018-03-24 LAB — BASIC METABOLIC PANEL
Anion gap: 10 (ref 5–15)
BUN: 5 mg/dL — AB (ref 6–20)
CO2: 25 mmol/L (ref 22–32)
Calcium: 8.5 mg/dL — ABNORMAL LOW (ref 8.9–10.3)
Chloride: 101 mmol/L (ref 98–111)
Creatinine, Ser: 0.63 mg/dL (ref 0.61–1.24)
GFR calc Af Amer: >60 mL/min (ref >60)
GFR calc non Af Amer: >60 mL/min (ref >60)
GLUCOSE: 111 mg/dL — AB (ref 70–99)
Potassium: 3.9 mmol/L (ref 3.5–5.1)
Sodium: 136 mmol/L (ref 135–145)

## 2018-03-24 MED ORDER — CARBAMAZEPINE 100 MG PO CHEW
100.0000 mg | CHEWABLE_TABLET | Freq: Four times a day (QID) | ORAL | Status: DC
  Filled 2018-03-24 (×3): qty 1

## 2018-03-24 MED ORDER — SENNOSIDES-DOCUSATE SODIUM 8.6-50 MG PO TABS
1.0000 | ORAL_TABLET | Freq: Two times a day (BID) | ORAL | Status: DC
  Administered 2018-03-24 – 2018-03-26 (×4): 1 via ORAL
  Filled 2018-03-24 (×4): qty 1

## 2018-03-24 MED ORDER — CARBAMAZEPINE 100 MG PO CHEW
100.0000 mg | CHEWABLE_TABLET | Freq: Four times a day (QID) | ORAL | Status: DC
  Administered 2018-03-24 – 2018-03-25 (×6): 100 mg via ORAL
  Filled 2018-03-24 (×7): qty 1

## 2018-03-24 NOTE — Progress Notes (Signed)
PROGRESS NOTE  SLATE DEBROUX [id number] DOB: 09-14-1960 DOA: 03/20/2018 PCP: [name], FNP  HPI/Recap of past 24 hours:  Report right sided jaw pain when try to move his tongue , no sure the detail, patient is not a good historian,    no fever, no sob Sinus brady in the high 50's, bp stable   He is a poor historian, he does not read or write   Assessment/Plan: Principal Problem:   Atrial fibrillation with RVR ([place]) Active Problems:   Trigeminal neuralgia   HTN (hypertension)   COPD (chronic obstructive pulmonary disease) (HCC)   Hyperlipidemia   Tobacco use   Hypokalemia   Hyponatremia   Stroke ([place])   Left hemiparesis (HCC)   Hypomagnesemia  PAF: -presented with afib/RVR -he was recently saw by cardiology a few weeks ago who recommended coumadin which patient has not been on, he came to the ED due to not feeling well found to have Afib/RVR -he is started on cardizem drip, now converted to sinus rhythm with bradycardia -tsh 0.497 -cardiology consulted, input appreciated, will follow recommendation  Hyponatremia - from dehydration, continue ivf -also could be due to tegretol , taper off tegretol  -sodium nadir at 124, now normalized  hypokalemia/hypomagnesemia: -replaced, repeat lab in am, keep K.4, mag >2  HTN:  Patient has bradycardia can not tolerate up titrate lopressor or adding ccb  transition from lopressor to coreg , now bp elevated, may need to restart norvasc if bp still elevated   H/o CVA in 10/2017  with mild left side weakness  -no acute issues, continue statin, will need anticoagulation for stroke prevention  H/o Trigeminal neuralgia  -controlled by tegretol and neurontin -will need to transition tegretol due to the need of NOAC and hyponatremia -case discussed with neurology Dr Earnestine Leys who recommend transition from tegretol to keppra; Load keppra 1500mg  x1 now, then start keppra 500mg  bid 12hr from loading dose taper tegretol to 200mg   QID start now, then change to 100mg  QID for 24hrs, then 100mg  BID for 24hr, then stop tegretol. Will keep on lovenox therapeutic dose while making the transition, can start NOAC after taper off tegretol. Neurology dr Earnestine Leys input appreciated  Patient's son reports patient is followed by neurology Dr Posey Pronto for trigeminal neuralgia, advised patient should follow up in 1-2 weeks post discharge.  Smoker: smoking cessation education provided I have discussed tobacco cessation with the patient.  I have counseled the patient regarding the negative impacts of continued tobacco use including but not limited to lung cancer, COPD, and cardiovascular disease.  I have discussed alternatives to tobacco and modalities that may help facilitate tobacco cessation including but not limited to biofeedback, hypnosis, and medications.  Total time spent with tobacco counseling was 4 minutes.   Oral thrush: Start topical nystatin solution   Code Status: full  Family Communication: patient  And son over the phone on 2/7, at bedside on 2/8.  Disposition Plan: home early next week after transition from tegretol to keppra   Consultants:  Cardiology  Neurology Dr Earnestine Leys on 2/7  Procedures:  none  Antibiotics:  none   Objective: BP (!) 150/90 (BP Location: Right Arm)   Pulse (!) 48   Temp 98.7 F (37.1 C) (Oral)   Resp 16   Ht 5\' 9"  (1.753 m)   Wt 106.4 kg   SpO2 98%   BMI 34.64 kg/m   Intake/Output Summary (Last 24 hours) at 03/24/2018 0856 Last data filed at 03/24/2018 0800 Gross  per 24 hour  Intake 2680 ml  Output 2290 ml  Net 390 ml   Filed Weights   03/22/18 0508 03/23/18 0338 03/24/18 0519  Weight: 111.6 kg 110.3 kg 106.4 kg    Exam: Patient is examined daily including today on 03/24/2018, exams remain the same as of yesterday except that has changed    General:  NAD  Cardiovascular: RRR  Respiratory: CTABL  Abdomen: Soft/ND/NT, positive BS  Musculoskeletal: No  Edema  Neuro: alert, oriented   Data Reviewed: Basic Metabolic Panel: Recent Labs  Lab 03/20/18 2211 03/20/18 2332 03/21/18 0643 03/22/18 0434 03/23/18 0528 03/24/18 0303  NA 124*  --  127* 128* 135 136  K 2.9*  --  4.0 3.9 5.2* 3.9  CL 88*  --  94* 97* 102 101  CO2 24  --  23 25 27 25   GLUCOSE 185*  --  138* 111* 116* 111*  BUN <5*  --  <5* <5* <5* 5*  CREATININE 0.64  --  0.63 0.62 0.68 0.63  CALCIUM 8.6*  --  8.2* 7.9* 8.4* 8.5*  MG  --  1.6*  --  1.8 2.0  --    Liver Function Tests: Recent Labs  Lab 03/22/18 0434 03/24/18 0303  AST 15 15  ALT 19 22  ALKPHOS 65 79  BILITOT 0.4 0.7  PROT 5.5* 5.9*  ALBUMIN 3.0* 3.3*   No results for input(s): LIPASE, AMYLASE in the last 168 hours. No results for input(s): AMMONIA in the last 168 hours. CBC: Recent Labs  Lab 03/20/18 2211 03/21/18 0643 03/23/18 0528 03/24/18 0303  WBC 9.3 8.2 6.3 6.2  HGB 14.9 13.8 12.7* 12.3*  HCT 42.3 39.4 37.6* 36.1*  MCV 84.4 84.0 87.6 88.5  PLT 226 249 218 208   Cardiac Enzymes:   Recent Labs  Lab 03/20/18 2211 03/21/18 0100 03/21/18 0643 03/21/18 1213  TROPONINI <0.03 0.03* 0.03* <0.03   BNP (last 3 results) No results for input(s): BNP in the last 8760 hours.  ProBNP (last 3 results) No results for input(s): PROBNP in the last 8760 hours.  CBG: No results for input(s): GLUCAP in the last 168 hours.  No results found for this or any previous visit (from the past 240 hour(s)).   Studies: No results found.  Scheduled Meds: . aspirin  325 mg Oral Daily  . atorvastatin  40 mg Oral q1800  . carbamazepine  100 mg Oral QID  . carvedilol  3.125 mg Oral BID WC  . enoxaparin (LOVENOX) injection  1 mg/kg Subcutaneous Q12H  . fluticasone  2 spray Each Nare Daily  . fluticasone furoate-vilanterol  1 puff Inhalation Daily   And  . umeclidinium bromide  1 puff Inhalation Daily  . gabapentin  600 mg Oral BID  . gabapentin  600 mg Oral QHS  . guaiFENesin  600 mg Oral BID  .  levETIRAcetam  500 mg Oral BID  . nicotine  21 mg Transdermal Daily  . nystatin  5 mL Mouth/Throat QID  . senna-docusate  1 tablet Oral BID    Continuous Infusions:    Time spent: 30mins,  I have personally reviewed and interpreted on  03/24/2018 daily labs, tele strips, imagings as discussed above under date review session and assessment and plans.  I reviewed all nursing notes, pharmacy notes, consultant notes,  vitals, pertinent old records  I have discussed plan of care as described above with RN , patient on 03/24/2018   Florencia Reasons MD, PhD  [facility] Pager [phone]. If 7PM-7AM, please contact night-coverage at [email or web address], password [facility] 03/24/2018, 8:56 AM  LOS: 2 days

## 2018-03-25 DIAGNOSIS — R001 Bradycardia, unspecified: Secondary | ICD-10-CM

## 2018-03-25 DIAGNOSIS — K120 Recurrent oral aphthae: Secondary | ICD-10-CM

## 2018-03-25 LAB — MAGNESIUM: Magnesium: 2 mg/dL (ref 1.7–2.4)

## 2018-03-25 LAB — BASIC METABOLIC PANEL
Anion gap: 8 (ref 5–15)
BUN: 5 mg/dL — ABNORMAL LOW (ref 6–20)
CALCIUM: 8.4 mg/dL — AB (ref 8.9–10.3)
CO2: 24 mmol/L (ref 22–32)
CREATININE: 0.65 mg/dL (ref 0.61–1.24)
Chloride: 103 mmol/L (ref 98–111)
GFR calc non Af Amer: >60 mL/min (ref >60)
Glucose, Bld: 112 mg/dL — ABNORMAL HIGH (ref 70–99)
Potassium: 3.8 mmol/L (ref 3.5–5.1)
Sodium: 135 mmol/L (ref 135–145)

## 2018-03-25 MED ORDER — CARBAMAZEPINE 100 MG PO CHEW
100.0000 mg | CHEWABLE_TABLET | Freq: Two times a day (BID) | ORAL | Status: DC
  Administered 2018-03-25 – 2018-03-26 (×3): 100 mg via ORAL
  Filled 2018-03-25 (×2): qty 1

## 2018-03-25 MED ORDER — VITAMIN B-6 25 MG PO TABS
25.0000 mg | ORAL_TABLET | Freq: Every day | ORAL | Status: DC
  Administered 2018-03-25 – 2018-03-26 (×2): 25 mg via ORAL
  Filled 2018-03-25 (×2): qty 1

## 2018-03-25 MED ORDER — VITAMIN B-12 100 MCG PO TABS
50.0000 ug | ORAL_TABLET | Freq: Every day | ORAL | Status: DC
  Administered 2018-03-25 – 2018-03-26 (×2): 50 ug via ORAL
  Filled 2018-03-25: qty 1

## 2018-03-25 MED ORDER — VITAMIN C 500 MG PO TABS
250.0000 mg | ORAL_TABLET | Freq: Every day | ORAL | Status: DC
  Administered 2018-03-25 – 2018-03-26 (×2): 250 mg via ORAL
  Filled 2018-03-25: qty 1

## 2018-03-25 MED ORDER — MAGIC MOUTHWASH W/LIDOCAINE
5.0000 mL | Freq: Three times a day (TID) | ORAL | Status: DC | PRN
  Administered 2018-03-25 – 2018-03-26 (×4): 5 mL via ORAL
  Filled 2018-03-25 (×6): qty 5

## 2018-03-25 NOTE — Consult Note (Addendum)
   [facility] CM Inpatient Consult   03/25/2018  Matthew [name] 02/01/1961 [id number]     Patient screened for potential [name] services due to unplanned readmission risk score of 29% (high).  Went to bedside to discuss Matthew [name] program services. [name] was unable to talk clearly. He advised Probation officer to contact [employer] (son) to further discuss. [name] states " my son will let you know if I need that service or not." [name] indicates he can not read or write. States his medication bottles are color coded.  Telephone call made to Matthew [name] (son) at [phone] to discuss and explain [name] services. Matthew [name] is agreeable and states he should be contacted for post hospital discharge calls. [name] is agreeable to this plan as well. [facility] written consent obtained and [facility] folder provided. Matthew [name] states even though he lives in [place] he is his father's main support and is able to relay information to [name] and the like.   Confirmed Primary Care Provider is Evelina Dun,  FNP with [facility] (this practice provides post hospital discharge calls).  [name] has a history of AFIB with RVR, HTN, HLD, CVA, COPD. Will make referral to [facility] for COPD.  Sent notification to inpatient RNCM to make aware [facility] to follow post hospital discharge.   [name], MSN-Ed, RN,BSN [facility] Liaison [phone]

## 2018-03-25 NOTE — Care Management Important Message (Signed)
Important Message  Patient Details  Name: Matthew [name] MRN: [id number] Date of Birth: [birth year]   Medicare Important Message Given:  Yes    [name] [name] 03/25/2018, 2:59 PM

## 2018-03-25 NOTE — Progress Notes (Signed)
Progress Note  Patient Name: Matthew [name] Date of Encounter: 03/25/2018  Primary Cardiologist: Rozann Lesches, MD   Subjective   Feeling well.  No chest pain, shortness of breath, lightheadedness, dizziness or palpitations.   Inpatient Medications    Scheduled Meds: . aspirin  325 mg Oral Daily  . atorvastatin  40 mg Oral q1800  . carbamazepine  100 mg Oral BID  . carvedilol  3.125 mg Oral BID WC  . enoxaparin (LOVENOX) injection  1 mg/kg Subcutaneous Q12H  . fluticasone  2 spray Each Nare Daily  . fluticasone furoate-vilanterol  1 puff Inhalation Daily   And  . umeclidinium bromide  1 puff Inhalation Daily  . gabapentin  600 mg Oral BID  . gabapentin  600 mg Oral QHS  . guaiFENesin  600 mg Oral BID  . levETIRAcetam  500 mg Oral BID  . nicotine  21 mg Transdermal Daily  . nystatin  5 mL Mouth/Throat QID  . senna-docusate  1 tablet Oral BID  . vitamin B-12  50 mcg Oral Daily  . vitamin B-6  25 mg Oral Daily  . vitamin C  250 mg Oral Daily   Continuous Infusions:  PRN Meds: acetaminophen **OR** acetaminophen, albuterol, hydrALAZINE, hydrOXYzine, magic mouthwash w/lidocaine, ondansetron (ZOFRAN) IV   Vital Signs    Vitals:   03/25/18 0814 03/25/18 0836 03/25/18 1158 03/25/18 1629  BP: (!) 167/98  (!) 154/97 (!) 154/83  Pulse:   (!) 59 (!) 47  Resp:   20 18  Temp:   (!) 97.5 F (36.4 C) (!) 97.5 F (36.4 C)  TempSrc:   Axillary Axillary  SpO2:  98% 98% 99%  Weight:      Height:        Intake/Output Summary (Last 24 hours) at 03/25/2018 1655 Last data filed at 03/25/2018 1629 Gross per 24 hour  Intake 1800 ml  Output 2875 ml  Net -1075 ml   Last 3 Weights 03/25/2018 03/24/2018 03/23/2018  Weight (lbs) 241 lb 1.6 oz 234 lb 9.6 oz 243 lb 3.2 oz  Weight (kg) 109.362 kg 106.414 kg 110.315 kg      Telemetry    Sinus Matthew, sinus bradycardia.  NSVT up to 3 beats.  Rate 40s-90s.   - Personally Reviewed  ECG    03/25/18: Sinus bradycardia.  Rate 46 bpm.  RBBB.   LAFB.  - Personally Reviewed  Physical Exam   VS:  BP (!) 154/83 (BP Location: Right Arm)   Pulse (!) 47   Temp (!) 97.5 F (36.4 C) (Axillary)   Resp 18   Ht 5\' 9"  (1.753 m)   Wt 109.4 kg   SpO2 99%   BMI 35.60 kg/m  , BMI Body mass index is 35.6 kg/m. GENERAL:  Well appearing HEENT: Pupils equal round and reactive, fundi not visualized, oral mucosa unremarkable NECK:  No jugular venous distention, waveform within normal limits, carotid upstroke brisk and symmetric, no bruits, no thyromegaly LYMPHATICS:  No cervical adenopathy LUNGS:  Clear to auscultation bilaterally HEART:  Regular Matthew.  Bradycardic.  PMI not displaced or sustained,S1 and S2 within normal limits, no S3, no S4, no clicks, no rubs, no murmurs ABD:  Flat, positive bowel sounds normal in frequency in pitch, no bruits, no rebound, no guarding, no midline pulsatile mass, no hepatomegaly, no splenomegaly EXT:  2 plus pulses throughout, no edema, no cyanosis no clubbing SKIN:  No rashes no nodules NEURO:  Cranial nerves II through XII grossly intact, motor  grossly intact throughout [facility]:  Cognitively intact, oriented to person place and time   Labs    Chemistry Recent Labs  Lab 03/22/18 0434 03/23/18 0528 03/24/18 0303 03/25/18 0425  NA 128* 135 136 135  K 3.9 5.2* 3.9 3.8  CL 97* 102 101 103  CO2 25 27 25 24   GLUCOSE 111* 116* 111* 112*  BUN <5* <5* 5* 5*  CREATININE 0.62 0.68 0.63 0.65  CALCIUM 7.9* 8.4* 8.5* 8.4*  PROT 5.5*  --  5.9*  --   ALBUMIN 3.0*  --  3.3*  --   AST 15  --  15  --   ALT 19  --  22  --   ALKPHOS 65  --  79  --   BILITOT 0.4  --  0.7  --   GFRNONAA >60 >60 >60 >60  GFRAA >60 >60 >60 >60  ANIONGAP 6 6 10 8      Hematology Recent Labs  Lab 03/21/18 0643 03/23/18 0528 03/24/18 0303  WBC 8.2 6.3 6.2  RBC 4.69 4.29 4.08*  HGB 13.8 12.7* 12.3*  HCT 39.4 37.6* 36.1*  MCV 84.0 87.6 88.5  MCH 29.4 29.6 30.1  MCHC 35.0 33.8 34.1  RDW 11.7 11.9 12.0  PLT 249 218 208     Cardiac Enzymes Recent Labs  Lab 03/20/18 2211 03/21/18 0100 03/21/18 0643 03/21/18 1213  TROPONINI <0.03 0.03* 0.03* <0.03   No results for input(s): TROPIPOC in the last 168 hours.   BNPNo results for input(s): BNP, PROBNP in the last 168 hours.   DDimer No results for input(s): DDIMER in the last 168 hours.   Radiology    No results found.  Cardiac Studies   Echo 10/23/17: Study Conclusions  - Limited echo in setting of stroke. - Left ventricle: The cavity size was normal. Wall thickness was   increased in a pattern of moderate to severe LVH. Systolic   function was normal. The estimated ejection fraction was in the   range of 55% to 60%. Wall motion was normal; there were no   regional wall motion abnormalities. - Aortic valve: Valve area (VTI): 2.11 cm^2. Valve area (Vmax):   2.08 cm^2.   Patient Profile     58 y.o. male with hypertension, recurrent stroke, COPD, ongoing tobacco abuse, hypertension, hyperlipidemia, and obesity with atrial fibrillation with RVR.  Assessment & Plan    # Atrial fibrillation with RVR: # Sinus bradycardia: # Sick sinus syndrome: Cardiology was intially consulted for atrial fibrillation with RVR.  This was first noted 02/2018.  He was initially started on warfarin due to concern for interaction no DOACs with carbamazepine.  This is being switched to [place] so that he can be on a DOAC.  He has limited medical literacy and is unable to read or write.  He was evaluated by EP and not thought to be a good candidate for antiarrhythmics due to noncompliance.  He is now in sinus Matthew and cardiology was asked to see him again due to bradycardia. Initial rates in afib were in the 140s.  Potassium was 2.9 and magnesium was 1.6.  He has been on carvedilol 3.125mg  bid.  One dose was held on 2/9 2/2 bradycardia.  He has been asymptomatic.  He was similarly bradycardic when evaluated by EP.  Given his lack of symptoms would not consider PPM at this  time.  He will require close outpatient monitoring, especially given his bifascicular block.  He has outpatient afib clinic follow up.  Query whether he may be an ablation candidate at a later time.  LA is normal in size.  # Hypertension: Blood pressure poorly controlled.  No room to increase carvedilol 2/2 bradycardia.  Continue carvedilol and add amlodipine 2.5mg  daily.   # Hyperlipidemia: Continue atorvastatin.   # Recurrent stroke: Anticoagulation and lipid management as above.  # Tobacco abuse: Cessation advised.  He is not interested.       For questions or updates, please contact [place] Please consult [email or web address] for contact info under        Signed, Skeet Latch, MD  03/25/2018, 4:55 PM

## 2018-03-25 NOTE — Progress Notes (Signed)
PROGRESS NOTE  Matthew [name] ZOX:[id number] DOB: 14-Sep-1960 DOA: 03/20/2018 PCP: [name], FNP  HPI/Recap of past 24 hours:  Report right sided jaw pain when try to move his tongue , no sure the detail, patient is not a good historian,    no fever, no sob Sinus brady in the high 40's to low 50's, bp stable, he denies symptom   He is a poor historian, he does not read or write   Assessment/Plan: Principal Problem:   Atrial fibrillation with RVR ([place]) Active Problems:   Trigeminal neuralgia   HTN (hypertension)   COPD (chronic obstructive pulmonary disease) (HCC)   Hyperlipidemia   Tobacco use   Hypokalemia   Hyponatremia   Stroke ([place])   Left hemiparesis (HCC)   Hypomagnesemia  PAF: -presented with afib/RVR -he was recently saw by cardiology a few weeks ago who recommended coumadin which patient has not been on, he came to the ED due to not feeling well found to have Afib/RVR -he is started on cardizem drip, now converted to sinus rhythm with bradycardia -tsh 0.497 -cardiology consulted, input appreciated, will follow recommendation  Hyponatremia - from dehydration, continue ivf -also could be due to tegretol , taper off tegretol  -sodium nadir at 124, now normalized  hypokalemia/hypomagnesemia: -replaced, repeat lab in am, keep K.4, mag >2  HTN:  Patient has bradycardia can not tolerate up titrate lopressor or adding ccb  transition from lopressor to coreg , now bp elevated, may need to restart norvasc if bp still elevated, patient has bradycardia, not able to uptitrate coreg.   H/o CVA in 10/2017  with mild left side weakness  -no acute issues, continue statin, will need anticoagulation for stroke prevention  H/o Trigeminal neuralgia  -controlled by tegretol and neurontin -will need to transition tegretol due to the need of NOAC and hyponatremia -case discussed with neurology Dr Earnestine Leys who recommend transition from tegretol to keppra; Load keppra 1500mg   x1 now, then start keppra 500mg  bid 12hr from loading dose taper tegretol to 200mg  QID start now, then change to 100mg  QID for 24hrs, then 100mg  BID for 24hr, then stop tegretol. Will keep on lovenox therapeutic dose while making the transition, can start NOAC after taper off tegretol. Neurology dr Earnestine Leys input appreciated  Patient's son reports patient is followed by neurology Dr Posey Pronto for trigeminal neuralgia, advised patient should follow up in 1-2 weeks post discharge.  Smoker: smoking cessation education provided I have discussed tobacco cessation with the patient.  I have counseled the patient regarding the negative impacts of continued tobacco use including but not limited to lung cancer, COPD, and cardiovascular disease.  I have discussed alternatives to tobacco and modalities that may help facilitate tobacco cessation including but not limited to biofeedback, hypnosis, and medications.  Total time spent with tobacco counseling was 4 minutes.   Oral thrush: Start topical nystatin solution   Canker sore: Right buccal mucosa Magic mouth wash,  Vitamin b and c supplement   Code Status: full  Family Communication: patient  And son over the phone on 2/7, at bedside on 2/8.  Disposition Plan: home, tomorrow with cardiology clearance    Consultants:  Cardiology  Neurology Dr Earnestine Leys on 2/7  Procedures:  none  Antibiotics:  none   Objective: BP (!) 167/98   Pulse (!) 47   Temp (!) 97.5 F (36.4 C) (Axillary)   Resp 20   Ht 5\' 9"  (1.753 m)   Wt 109.4 kg   SpO2  98%   BMI 35.60 kg/m   Intake/Output Summary (Last 24 hours) at 03/25/2018 0856 Last data filed at 03/25/2018 0600 Gross per 24 hour  Intake 1320 ml  Output 1400 ml  Net -80 ml   Filed Weights   03/23/18 0338 03/24/18 0519 03/25/18 0500  Weight: 110.3 kg 106.4 kg 109.4 kg    Exam: Patient is examined daily including today on 03/25/2018, exams remain the same as of yesterday except that has changed      General:  NAD, canker sore right buccal mucosa, tender,   Cardiovascular: RRR  Respiratory: CTABL  Abdomen: Soft/ND/NT, positive BS  Musculoskeletal: No Edema  Neuro: alert, oriented   Data Reviewed: Basic Metabolic Panel: Recent Labs  Lab 03/20/18 2332 03/21/18 0643 03/22/18 0434 03/23/18 0528 03/24/18 0303 03/25/18 0425  NA  --  127* 128* 135 136 135  K  --  4.0 3.9 5.2* 3.9 3.8  CL  --  94* 97* 102 101 103  CO2  --  23 25 27 25 24   GLUCOSE  --  138* 111* 116* 111* 112*  BUN  --  <5* <5* <5* 5* 5*  CREATININE  --  0.63 0.62 0.68 0.63 0.65  CALCIUM  --  8.2* 7.9* 8.4* 8.5* 8.4*  MG 1.6*  --  1.8 2.0  --  2.0   Liver Function Tests: Recent Labs  Lab 03/22/18 0434 03/24/18 0303  AST 15 15  ALT 19 22  ALKPHOS 65 79  BILITOT 0.4 0.7  PROT 5.5* 5.9*  ALBUMIN 3.0* 3.3*   No results for input(s): LIPASE, AMYLASE in the last 168 hours. No results for input(s): AMMONIA in the last 168 hours. CBC: Recent Labs  Lab 03/20/18 2211 03/21/18 0643 03/23/18 0528 03/24/18 0303  WBC 9.3 8.2 6.3 6.2  HGB 14.9 13.8 12.7* 12.3*  HCT 42.3 39.4 37.6* 36.1*  MCV 84.4 84.0 87.6 88.5  PLT 226 249 218 208   Cardiac Enzymes:   Recent Labs  Lab 03/20/18 2211 03/21/18 0100 03/21/18 0643 03/21/18 1213  TROPONINI <0.03 0.03* 0.03* <0.03   BNP (last 3 results) No results for input(s): BNP in the last 8760 hours.  ProBNP (last 3 results) No results for input(s): PROBNP in the last 8760 hours.  CBG: No results for input(s): GLUCAP in the last 168 hours.  No results found for this or any previous visit (from the past 240 hour(s)).   Studies: No results found.  Scheduled Meds: . aspirin  325 mg Oral Daily  . atorvastatin  40 mg Oral q1800  . carbamazepine  100 mg Oral QID  . carvedilol  3.125 mg Oral BID WC  . enoxaparin (LOVENOX) injection  1 mg/kg Subcutaneous Q12H  . fluticasone  2 spray Each Nare Daily  . fluticasone furoate-vilanterol  1 puff Inhalation  Daily   And  . umeclidinium bromide  1 puff Inhalation Daily  . gabapentin  600 mg Oral BID  . gabapentin  600 mg Oral QHS  . guaiFENesin  600 mg Oral BID  . levETIRAcetam  500 mg Oral BID  . nicotine  21 mg Transdermal Daily  . nystatin  5 mL Mouth/Throat QID  . senna-docusate  1 tablet Oral BID    Continuous Infusions:    Time spent: 44mins,  I have personally reviewed and interpreted on  03/25/2018 daily labs, tele strips, imagings as discussed above under date review session and assessment and plans.  I reviewed all nursing notes, pharmacy notes, consultant notes,  vitals, pertinent old records  I have discussed plan of care as described above with RN , patient on 03/25/2018   Florencia Reasons MD, PhD  [facility] Pager [phone]. If 7PM-7AM, please contact night-coverage at [email or web address], password [facility] 03/25/2018, 8:56 AM  LOS: 3 days

## 2018-03-26 ENCOUNTER — Other Ambulatory Visit (HOSPITAL_COMMUNITY): Payer: Self-pay | Admitting: Nurse Practitioner

## 2018-03-26 ENCOUNTER — Encounter: Payer: Self-pay | Admitting: *Deleted

## 2018-03-26 LAB — CBC
HCT: 38.4 % — ABNORMAL LOW (ref 39.0–52.0)
Hemoglobin: 12.7 g/dL — ABNORMAL LOW (ref 13.0–17.0)
MCH: 29.1 pg (ref 26.0–34.0)
MCHC: 33.1 g/dL (ref 30.0–36.0)
MCV: 88.1 fL (ref 80.0–100.0)
Platelets: 216 10*3/uL (ref 150–400)
RBC: 4.36 MIL/uL (ref 4.22–5.81)
RDW: 11.9 % (ref 11.5–15.5)
WBC: 5.6 10*3/uL (ref 4.0–10.5)
nRBC: 0 % (ref 0.0–0.2)

## 2018-03-26 LAB — BASIC METABOLIC PANEL
Anion gap: 11 (ref 5–15)
BUN: <5 mg/dL — ABNORMAL LOW (ref 6–20)
CO2: 23 mmol/L (ref 22–32)
Calcium: 9.1 mg/dL (ref 8.9–10.3)
Chloride: 104 mmol/L (ref 98–111)
Creatinine, Ser: 0.65 mg/dL (ref 0.61–1.24)
GFR calc Af Amer: >60 mL/min (ref >60)
GFR calc non Af Amer: >60 mL/min (ref >60)
Glucose, Bld: 120 mg/dL — ABNORMAL HIGH (ref 70–99)
Potassium: 3.9 mmol/L (ref 3.5–5.1)
Sodium: 138 mmol/L (ref 135–145)

## 2018-03-26 LAB — MAGNESIUM: Magnesium: 2 mg/dL (ref 1.7–2.4)

## 2018-03-26 MED ORDER — MAGIC MOUTHWASH W/LIDOCAINE: 5.0000 mL | Freq: Three times a day (TID) | ORAL | 0 refills | Status: DC | PRN

## 2018-03-26 MED ORDER — LEVETIRACETAM 500 MG PO TABS: 500.0000 mg | ORAL_TABLET | Freq: Two times a day (BID) | ORAL | 0 refills | Status: DC

## 2018-03-26 MED ORDER — APIXABAN 5 MG PO TABS: 5.0000 mg | ORAL_TABLET | Freq: Two times a day (BID) | ORAL | Status: DC

## 2018-03-26 MED ORDER — ASPIRIN EC 81 MG PO TBEC: 81.0000 mg | DELAYED_RELEASE_TABLET | Freq: Every day | ORAL | 0 refills | Status: DC

## 2018-03-26 MED ORDER — CARVEDILOL 3.125 MG PO TABS: 3.1250 mg | ORAL_TABLET | Freq: Two times a day (BID) | ORAL | 0 refills | Status: DC

## 2018-03-26 MED ORDER — LISINOPRIL 5 MG PO TABS: 5.0000 mg | ORAL_TABLET | Freq: Every day | ORAL | 0 refills | Status: DC

## 2018-03-26 MED ORDER — APIXABAN 5 MG PO TABS: 5.0000 mg | ORAL_TABLET | Freq: Two times a day (BID) | ORAL | 0 refills | Status: DC

## 2018-03-26 MED ORDER — B COMPLEX-C PO TABS: 1.0000 | ORAL_TABLET | Freq: Every day | ORAL | 0 refills | Status: DC

## 2018-03-26 MED ORDER — CARBAMAZEPINE 100 MG PO CHEW
100.0000 mg | CHEWABLE_TABLET | Freq: Once | ORAL | Status: AC
  Administered 2018-03-26: 100 mg via ORAL
  Filled 2018-03-26: qty 1

## 2018-03-26 MED FILL — ELIQUIS 5 MG TABLET: 5 | 30 days supply | Qty: 60 | Fill #0 | Status: TO

## 2018-03-26 MED FILL — CARVEDILOL 3.125 MG TABLET: 3.125 | 30 days supply | Qty: 60 | Fill #0 | Status: TO

## 2018-03-26 MED FILL — B COMPLEX TABLET: 30 days supply | Qty: 30 | Fill #0 | Status: TO

## 2018-03-26 MED FILL — LISINOPRIL 5 MG TABLET: 5 | 30 days supply | Qty: 30 | Fill #0 | Status: TO

## 2018-03-26 MED FILL — ASPIRIN LOW DOSE 81 MG TBEC: 81 | 30 days supply | Qty: 30 | Fill #0 | Status: TO

## 2018-03-26 MED FILL — levETIRAcetam 500 MG TABS: 500 | 30 days supply | Qty: 60 | Fill #0 | Status: TO

## 2018-03-26 NOTE — Progress Notes (Signed)
ANTICOAGULATION CONSULT NOTE  Pharmacy Consult:  Lovenox to Eliquis Indication: atrial fibrillation  Allergies  Allergen Reactions  . Penicillins Other (See Comments)    unknown    Patient Measurements: Height: 5\' 9"  (175.3 cm) Weight: 237 lb (107.5 kg) IBW/kg (Calculated) : 70.7  Vital Signs: Temp: 98.5 F (36.9 C) (02/11 0754) Temp Source: Axillary (02/11 0754) BP: 160/101 (02/11 0751) Pulse Rate: 54 (02/11 0754)  Labs: Recent Labs    03/24/18 0303 03/25/18 0425 03/26/18 0424  HGB 12.3*  --  12.7*  HCT 36.1*  --  38.4*  PLT 208  --  216  CREATININE 0.63 0.65 0.65    Estimated Creatinine Clearance: 121.6 mL/min (by C-G formula based on SCr of 0.65 mg/dL).  Assessment: 38 YOM with history of Afib not taking his Coumadin PTA as recommended by Cards. Patient presented with AFib with RVR and hyponatremia.  To transition to Eliquis following carbamazepine taper, which is now complete  Received Lovenox at 5 am  Goal of Therapy:   Monitor platelets by anticoagulation protocol: Yes   Plan:  DC Lovenox Eliquis 5 mg po BID to start at 6 pm tonight  Eliquis prescription for a 30 day supply sent to [facility] (to use 30 day free card)   Thank you Anette Guarneri, PharmD [phone]  03/26/2018 8:58 AM

## 2018-03-26 NOTE — Progress Notes (Signed)
[facility] will sign off.   Medication Recommendations:  No changes  Other recommendations (labs, testing, etc):  n/a Follow up as an outpatient:  We will arrange follow up prior to discharge.  Morell Mears C. Oval Linsey, MD, [facility] 03/26/2018 9:47 AM

## 2018-03-26 NOTE — Care Management Note (Signed)
Case Management Note  Patient Details  Name: Matthew [name] MRN: [id number] Date of Birth: 1960/03/05  Subjective/Objective:  Pt presented for Atrial Fib. PTA from home with family support. Pt has Medicare and Medicaid. Patient unable to read and write. [facility] will be following in the community.                    Action/Plan: Patient will have medications delivered to bedside via [place]. No further needs from CM at this time.   Expected Discharge Date:  03/26/18               Expected Discharge Plan:  Home/Self Care(Independent from home alone, pt has PCP and denied barriers with paying for medications)  In-House Referral:  [facility]  Discharge planning Services  CM Consult  Post Acute Care Choice:  NA Choice offered to:  NA  DME Arranged:  N/A DME Agency:  NA  HH Arranged:  NA HH Agency:  NA  Status of Service:  Completed, signed off  If discussed at [place] of Stay Meetings, dates discussed:    Additional Comments:  Bethena Roys, RN 03/26/2018, 9:59 AM
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
## 2018-03-26 NOTE — Progress Notes (Deleted)
ANTICOAGULATION CONSULT NOTE  Pharmacy Consult:  Lovenox to Eliquis Indication: atrial fibrillation  Allergies  Allergen Reactions  . Penicillins Other (See Comments)    unknown    Patient Measurements: Height: 5\' 9"  (175.3 cm) Weight: 237 lb (107.5 kg) IBW/kg (Calculated) : 70.7  Vital Signs: Temp: 98.5 F (36.9 C) (02/11 0754) Temp Source: Axillary (02/11 0754) BP: 160/101 (02/11 0751) Pulse Rate: 54 (02/11 0754)  Labs: Recent Labs    03/24/18 0303 03/25/18 0425 03/26/18 0424  HGB 12.3*  --  12.7*  HCT 36.1*  --  38.4*  PLT 208  --  216  CREATININE 0.63 0.65 0.65    Estimated Creatinine Clearance: 121.6 mL/min (by C-G formula based on SCr of 0.65 mg/dL).  Assessment: 79 YOM with history of Afib not taking his Coumadin PTA as recommended by Cards. Patient presented with AFib with RVR and hyponatremia.  To transition to Eliquis following carbamazepine taper, which is now complete  Received Lovenox at 5 am  Goal of Therapy:   Monitor platelets by anticoagulation protocol: Yes   Plan:  DC Lovenox Eliquis 5 mg po BID to start at 6 pm tonight   Thank you Anette Guarneri, PharmD [phone]  03/26/2018 8:56 AM

## 2018-03-27 ENCOUNTER — Other Ambulatory Visit: Payer: Self-pay | Admitting: *Deleted

## 2018-03-27 NOTE — Patient Outreach (Signed)
Referral received from hospital liason, pt hospitalized 2/5-2/11/20 for episode Atrial Fibrillation, pt has history CVA, COPD, tobacco use 2 ppd currently, chronic pain. Primary MD office provides transition of care. RN CM spoke with patient's son Que Meneely by telephone (son is listed as primary contact), HIPAA verified, [name] reports he lives 2.5 hours away and manages pt care over the phone and other family members such as cousins assist with transportation, etc.  [name] also visits his father and provides any assistance needed.  [name] was able to answer most questions for the screening but RN CM will need to call pt directly to schedule home visit, pt cannot read or write.  RN CM reviewed all upcoming appointments with A-Fib clinic on 2/21 and follow up with primary care within one week and bloodwork due CBC, BMP, also follow up with neurology in one week, Justin verbalizes understanding and will make sure appointments are arranged.  [name] states pt is now on eliquis as coumadin regimen was too complicated, medications are color coded and [name] states pt has all medications and taking as prescribed. RN CM spoke with pt, HIPAA verified, RN CM explained [facility] program and pt agreeable to home visit.  PLAN See pt for initial home visit this week  Jacqlyn Larsen [facility] [place] Coordinator [phone]

## 2018-03-28 ENCOUNTER — Emergency Department (HOSPITAL_COMMUNITY)
Admission: EM | Admit: 2018-03-28 | Discharge: 2018-03-28 | Disposition: A | Discharge status: Home / Self Care | Payer: Medicare Other | Attending: Emergency Medicine | Admitting: Emergency Medicine

## 2018-03-28 ENCOUNTER — Encounter (HOSPITAL_COMMUNITY): Payer: Self-pay

## 2018-03-28 ENCOUNTER — Other Ambulatory Visit: Payer: Self-pay

## 2018-03-28 ENCOUNTER — Ambulatory Visit: Payer: Medicare Other | Admitting: *Deleted

## 2018-03-28 ENCOUNTER — Other Ambulatory Visit: Payer: Self-pay | Admitting: *Deleted

## 2018-03-28 ENCOUNTER — Encounter: Payer: Self-pay | Admitting: *Deleted

## 2018-03-28 DIAGNOSIS — J449 Chronic obstructive pulmonary disease, unspecified: Secondary | ICD-10-CM | POA: Diagnosis not present

## 2018-03-28 DIAGNOSIS — B370 Candidal stomatitis: Secondary | ICD-10-CM | POA: Insufficient documentation

## 2018-03-28 DIAGNOSIS — Z7982 Long term (current) use of aspirin: Secondary | ICD-10-CM | POA: Insufficient documentation

## 2018-03-28 DIAGNOSIS — R5381 Other malaise: Secondary | ICD-10-CM | POA: Diagnosis not present

## 2018-03-28 DIAGNOSIS — R531 Weakness: Secondary | ICD-10-CM | POA: Diagnosis present

## 2018-03-28 DIAGNOSIS — F17210 Nicotine dependence, cigarettes, uncomplicated: Secondary | ICD-10-CM | POA: Insufficient documentation

## 2018-03-28 DIAGNOSIS — I10 Essential (primary) hypertension: Secondary | ICD-10-CM | POA: Diagnosis not present

## 2018-03-28 DIAGNOSIS — R52 Pain, unspecified: Secondary | ICD-10-CM | POA: Diagnosis not present

## 2018-03-28 DIAGNOSIS — Z79899 Other long term (current) drug therapy: Secondary | ICD-10-CM | POA: Insufficient documentation

## 2018-03-28 LAB — COMPREHENSIVE METABOLIC PANEL
ALBUMIN: 4 g/dL (ref 3.5–5.0)
ALT: 41 U/L (ref 0–44)
AST: 28 U/L (ref 15–41)
Alkaline Phosphatase: 76 U/L (ref 38–126)
Anion gap: 12 (ref 5–15)
CO2: 23 mmol/L (ref 22–32)
CREATININE: 0.61 mg/dL (ref 0.61–1.24)
Calcium: 9.3 mg/dL (ref 8.9–10.3)
Chloride: 105 mmol/L (ref 98–111)
GFR calc Af Amer: >60 mL/min (ref >60)
GFR calc non Af Amer: >60 mL/min (ref >60)
GLUCOSE: 109 mg/dL — AB (ref 70–99)
Potassium: 3.6 mmol/L (ref 3.5–5.1)
Sodium: 140 mmol/L (ref 135–145)
Total Bilirubin: 0.7 mg/dL (ref 0.3–1.2)
Total Protein: 7.2 g/dL (ref 6.5–8.1)

## 2018-03-28 LAB — CBC WITH DIFFERENTIAL/PLATELET
Abs Immature Granulocytes: 0.03 10*3/uL (ref 0.00–0.07)
Basophils Absolute: 0 10*3/uL (ref 0.0–0.1)
Basophils Relative: 0 %
EOS PCT: 1 %
Eosinophils Absolute: 0.1 10*3/uL (ref 0.0–0.5)
HCT: 42 % (ref 39.0–52.0)
Hemoglobin: 14.5 g/dL (ref 13.0–17.0)
Immature Granulocytes: 0 %
Lymphocytes Relative: 29 %
Lymphs Abs: 2.3 10*3/uL (ref 0.7–4.0)
MCH: 30.5 pg (ref 26.0–34.0)
MCHC: 34.5 g/dL (ref 30.0–36.0)
MCV: 88.2 fL (ref 80.0–100.0)
MONOS PCT: 8 %
Monocytes Absolute: 0.6 10*3/uL (ref 0.1–1.0)
Neutro Abs: 4.7 10*3/uL (ref 1.7–7.7)
Neutrophils Relative %: 62 %
Platelets: 239 10*3/uL (ref 150–400)
RBC: 4.76 MIL/uL (ref 4.22–5.81)
RDW: 12 % (ref 11.5–15.5)
WBC: 7.8 10*3/uL (ref 4.0–10.5)
nRBC: 0 % (ref 0.0–0.2)

## 2018-03-28 LAB — URINALYSIS, ROUTINE W REFLEX MICROSCOPIC
Bilirubin Urine: NEGATIVE
Glucose, UA: NEGATIVE mg/dL
Hgb urine dipstick: NEGATIVE
Ketones, ur: NEGATIVE mg/dL
Leukocytes,Ua: NEGATIVE
Nitrite: NEGATIVE
Protein, ur: NEGATIVE mg/dL
SPECIFIC GRAVITY, URINE: 1.006 (ref 1.005–1.030)
pH: 8 (ref 5.0–8.0)

## 2018-03-28 LAB — LIPASE, BLOOD: LIPASE: 23 U/L (ref 11–51)

## 2018-03-28 MED ORDER — MORPHINE SULFATE (PF) 4 MG/ML IV SOLN
4.0000 mg | Freq: Once | INTRAVENOUS | Status: AC
  Administered 2018-03-28: 4 mg via INTRAVENOUS
  Filled 2018-03-28: qty 1

## 2018-03-28 MED ORDER — MAGIC MOUTHWASH W/LIDOCAINE: 5.0000 mL | Freq: Three times a day (TID) | ORAL | 0 refills | Status: DC | PRN

## 2018-03-28 MED ORDER — MAGIC MOUTHWASH
10.0000 mL | Freq: Once | ORAL | Status: AC
  Administered 2018-03-28: 10 mL via ORAL
  Filled 2018-03-28 (×2): qty 10

## 2018-03-28 MED ORDER — SODIUM CHLORIDE 0.9 % IV BOLUS
1000.0000 mL | Freq: Once | INTRAVENOUS | Status: AC
  Administered 2018-03-28: 1000 mL via INTRAVENOUS

## 2018-03-28 NOTE — ED Provider Notes (Signed)
[facility] EMERGENCY DEPARTMENT Provider Note   CSN: [id number] Arrival date & time: 03/28/18  1703     History   Chief Complaint No chief complaint on file.   HPI Matthew [name] is a 58 y.o. male.  The history is provided by the patient and medical records. No language interpreter was used.  Abdominal Pain     58 year old male with history of prior stroke, depression, chronic pain, obesity, polysubstance abuse, atrial fibrillation on Eliquis brought here via EMS from home for evaluation of generalized weakness.  History is very difficult to obtain as patient is having difficulty speaking.  From what I can gather, patient was admitted to the hospital for high blood pressure and atrial fibrillation recently.  He was discharged back home several days ago.  He mention he is currently having pain in his tongue and having trouble eating and drinking because of that.  He feels hungry but hurts to eat.  Therefore, he is now complaining of headache, tongue pain, hunger pain, and generalized weakness.  He does not complain of any chest pain or trouble breathing or dysuria.  Per hospital note, patient admitted for canker sores/oral thrush, and atrial fibrillation with RVR.  He also diagnosed with trigeminal neuralgia involving the right side of his head. He did received Nystatin and Magic mouthwash during hospitalization which provide relieves.   Past Medical History:  Diagnosis Date  . Abscess   . Anxiety   . Aphthous ulcer   . Arthritis   . Asthma   . Chronic bronchitis   . Chronic pain   . COPD (chronic obstructive pulmonary disease) ([place])   . CVA (cerebral vascular accident) ([place])   . Depression   . Hyperlipidemia   . Hypertension   . Trigeminal neuralgia   . Vertigo     Patient Active Problem List   Diagnosis Date Noted  . Canker sores oral   . Bradycardia   . Stroke ([place]) 03/21/2018  . Left hemiparesis ([place])   . Hypomagnesemia   . Hypokalemia 03/20/2018    . Atrial fibrillation with RVR ([place]) 03/20/2018  . Hyponatremia 03/20/2018  . DDD (degenerative disc disease), lumbar 11/13/2017  . Femoroacetabular impingement of both hips 11/13/2017  . Late effect of cerebrovascular accident (CVA) 10/25/2017  . Acute CVA (cerebrovascular accident) ([place]) 10/22/2017  . Tobacco use 10/22/2017  . Hyperglycemia 10/22/2017  . Depression 12/25/2016  . Abnormal drug screen 11/02/2016  . Pain management contract broken 03/09/2016  . Coronary artery calcification 01/18/2016  . Thoracic aortic atherosclerosis ([place]) 01/18/2016  . Chronic back pain 08/03/2015  . Opioid dependence ([place]) 08/03/2015  . Obesity (BMI 30-39.9) 03/09/2015  . Smoker 02/02/2015  . Erectile dysfunction 08/24/2014  . Vitamin D deficiency 04/18/2014  . Hyperlipidemia 04/18/2014  . COPD (chronic obstructive pulmonary disease) ([place]) 04/17/2014  . Weakness 08/22/2012  . Chronic pain syndrome 08/19/2012  . Adjustment disorder with mixed anxiety and depressed mood 05/11/2012  . Trigeminal neuralgia 05/11/2012  . HTN (hypertension) 05/11/2012    Past Surgical History:  Procedure Laterality Date  . CHOLECYSTECTOMY    . HAND RECONSTRUCTION Right 1990's        Home Medications    Prior to Admission medications   Medication Sig Start Date End Date Taking? Authorizing Provider  albuterol (PROVENTIL HFA;VENTOLIN HFA) 108 (90 Base) MCG/ACT inhaler INHALE 2 PUFFS INTO LUNGS EVERY 6 HOURS AS NEEDED FOR WHEEZE OR SHORTNESS OF BREATH Patient taking differently: Inhale 2 puffs into the lungs every  6 (six) hours as needed for wheezing.  03/18/18   Evelina Dun A, FNP  amLODipine (NORVASC) 10 MG tablet TAKE 1 TABLET BY MOUTH EVERY DAY Patient taking differently: Take 10 mg by mouth daily.  11/06/17   [name], FNP  apixaban (ELIQUIS) 5 MG TABS tablet Take 1 tablet (5 mg total) by mouth 2 (two) times daily. 03/26/18   Florencia Reasons, MD  aspirin EC 81 MG tablet Take 1 tablet (81 mg total) by  mouth daily for 30 days. 03/26/18 04/25/18  Florencia Reasons, MD  atorvastatin (LIPITOR) 40 MG tablet Take 1 tablet (40 mg total) by mouth daily at 6 PM. 10/24/17   Kathie Dike, MD  B Complex-C (B-COMPLEX WITH VITAMIN C) tablet Take 1 tablet by mouth daily. 03/26/18   Florencia Reasons, MD  carvedilol (COREG) 3.125 MG tablet Take 1 tablet (3.125 mg total) by mouth 2 (two) times daily with a meal. 03/26/18   Florencia Reasons, MD  fluticasone (FLONASE) 50 MCG/ACT nasal spray USE 2 SPRAYS INTO BOTH NOSTRILS ONCE DAILY Patient taking differently: Place 2 sprays into both nostrils daily.  10/18/17   [name], FNP  gabapentin (NEURONTIN) 600 MG tablet Take 1 tablet in the morning, 1 tablet in the afternoon, and 1.5 tablet at bedtime Patient taking differently: Take 600-900 mg by mouth See admin instructions. Take 1 tablet in the morning, 1 tablet in the afternoon, and 1.5 tablet at bedtime 01/09/18   Evelina Dun A, FNP  hydrOXYzine (ATARAX/VISTARIL) 25 MG tablet TAKE 1 TABLET (25 MG TOTAL) BY MOUTH 3 (THREE) TIMES DAILY AS NEEDED FOR ANXIETY. Patient not taking: Reported on 03/28/2018 02/04/18   [name], FNP  levETIRAcetam (KEPPRA) 500 MG tablet Take 1 tablet (500 mg total) by mouth 2 (two) times daily. 03/26/18   Florencia Reasons, MD  lisinopril (PRINIVIL,ZESTRIL) 5 MG tablet Take 1 tablet (5 mg total) by mouth daily for 30 days. 03/26/18 04/25/18  Florencia Reasons, MD  magic mouthwash w/lidocaine SOLN Take 5 mLs by mouth 3 (three) times daily as needed for mouth pain. 03/26/18   Florencia Reasons, MD  TRELEGY ELLIPTA 100-62.5-25 MCG/INH AEPB INHALE 1 PUFF BY MOUTH EVERY DAY Patient taking differently: Inhale 1 puff into the lungs daily.  03/18/18   [name], FNP    Family History Family History  Problem Relation Age of Onset  . Hypertension Mother   . Diabetes Mother   . Hypertension Father   . Diabetes Father   . Cancer Father   . Emphysema Father     Social History Social History   Tobacco Use  . Smoking status:  Current Every Day Smoker    Packs/day: 2.00    Years: 40.00    Pack years: 80.00    Types: Cigarettes    Start date: 07/23/1975  . Smokeless tobacco: Never Used  Substance Use Topics  . Alcohol use: Yes    Comment: 08/21/2012 "quit drinking ~ 1985"  . Drug use: No     Allergies   Penicillins   Review of Systems Review of Systems  Gastrointestinal: Positive for abdominal pain.  All other systems reviewed and are negative.    Physical Exam Updated Vital Signs Pulse 83   Temp 98.8 F (37.1 C) (Oral)   Resp 18   SpO2 95%   Physical Exam Vitals signs and nursing note reviewed.  Constitutional:      General: He is not in acute distress.    Appearance: He is well-developed.  He is obese.  HENT:     Head: Atraumatic.     Comments: Mouth: Significant white coating and ulceration noted to tongue, tender to palpation. Eyes:     Conjunctiva/sclera: Conjunctivae normal.  Neck:     Musculoskeletal: Neck supple. No neck rigidity.  Cardiovascular:     Rate and Rhythm: Normal rate and regular rhythm.  Pulmonary:     Effort: Pulmonary effort is normal.     Breath sounds: Normal breath sounds.  Abdominal:     General: Abdomen is flat.     Tenderness: There is generalized abdominal tenderness. Negative signs include Murphy's sign and McBurney's sign.     Comments: Several bruises noted to anterior abdomen from Lovenox injection.  Musculoskeletal:     Comments: Able to move all 4 extremities.  Skin:    Findings: No rash.  Neurological:     Mental Status: He is alert.  Psychiatric:        Mood and Affect: Mood normal.      ED Treatments / Results  Labs (all labs ordered are listed, but only abnormal results are displayed) Labs Reviewed  COMPREHENSIVE METABOLIC PANEL - Abnormal; Notable for the following components:      Result Value   Glucose, Bld 109 (*)    BUN <5 (*)    All other components within normal limits  CBC WITH DIFFERENTIAL/PLATELET  URINALYSIS, ROUTINE W  REFLEX MICROSCOPIC  LIPASE, BLOOD    EKG None  Radiology No results found.  Procedures Procedures (including critical care time)  Medications Ordered in ED Medications  magic mouthwash (has no administration in time range)  sodium chloride 0.9 % bolus 1,000 mL (0 mLs Intravenous Stopped 03/28/18 1917)  morphine 4 MG/ML injection 4 mg (4 mg Intravenous Given 03/28/18 1847)     Initial Impression / Assessment and Plan / ED Course  I have reviewed the triage vital signs and the nursing notes.  Pertinent labs & imaging results that were available during my care of the patient were reviewed by me and considered in my medical decision making (see chart for details).     BP (!) 153/92   Pulse 64   Temp 98.8 F (37.1 C) (Oral)   Resp 17   SpO2 95%    Final Clinical Impressions(s) / ED Diagnoses   Final diagnoses:  Oral candidiasis    ED Discharge Orders         Ordered    magic mouthwash w/lidocaine SOLN  3 times daily PRN     03/28/18 2014         5:33 PM Patient has canker sores and oral candidiasis presenting today due to generalized weakness due to difficulty eating secondary to his oral thrush causing discomfort.  This was present during his recent hospitalization when he was treated for atrial fibrillation with RVR.  Patient complained of abdominal pain from regular Lovenox injection.  He also complained of right-sided headache from his trigeminal neuralgia.  History of chronic pain syndrome.  8:12 PM Labs are reassuring, no electrolyte derangement, no anemia, normal WBC.  Patient felt better with Magic mouthwash and can tolerate p.o.  Will discharge home with viscous lidocaine for comfort, nystatin for oral thrush and outpatient follow-up.  Return precaution discussed.   Domenic Moras, PA-C 03/28/18 2014    Duffy Bruce, MD 03/29/18 1308

## 2018-03-28 NOTE — ED Notes (Signed)
Pt has numerous bumps on his tongue and as a result has difficulty speaking. Pt states that he cannot eat or drink, he also cannot read or write to communicate.

## 2018-03-28 NOTE — ED Triage Notes (Signed)
Per [facility]: 190/100 check again, 194/96, did decrease to 160/80. Hx of stroke. Pt has generalized weakness. Was in hospital about 2-3 days ago. HX of afib and HTN. Pt alert and oriented X 4. Pt on room air. Pt was ambulatory on scene with no difficulty.

## 2018-03-28 NOTE — Patient Outreach (Signed)
[facility]) Care Management   03/28/2018  COHL BEHRENS [birth year] [id number]  Matthew [name] is an 58 y.o. male  Subjective: Initial home visit with pt, HIPAA verified, present in the home are 2 ladies, [name] and [name] (pts girlfriend), they report they assist pt with transportation, making appointments if needed, cooking, cleaning, etc if pt needs the assistance.  Pt states " I don't really have any goals"  RN CM ask pt if he would like to learn more about Atrial Fibrillation and pt states " I guess so".    Objective:  Vitals:   03/28/18 1151  BP: 130/70  Pulse: 60  Resp: 16  SpO2: 96%  Weight: 248 lb (112.5 kg)  Height: 1.753 m (5\' 9" )    ROS  Physical Exam  Constitutional: He is oriented to person, place, and time. He appears well-developed and well-nourished.  HENT:  Head: Normocephalic.  Neck: Normal range of motion. Neck supple.  Cardiovascular: Normal rate.  Respiratory: Effort normal.  bil breath sounds diminished all lobes  GI: Soft. Bowel sounds are normal.  Musculoskeletal: Normal range of motion.        General: No edema.  Neurological: He is alert and oriented to person, place, and time.  Skin: Skin is warm and dry.  Psychiatric: He has a normal mood and affect. His behavior is normal. Thought content normal.    Encounter Medications:   Outpatient Encounter Medications as of 03/28/2018  Medication Sig  . albuterol (PROVENTIL HFA;VENTOLIN HFA) 108 (90 Base) MCG/ACT inhaler INHALE 2 PUFFS INTO LUNGS EVERY 6 HOURS AS NEEDED FOR WHEEZE OR SHORTNESS OF BREATH (Patient taking differently: Inhale 2 puffs into the lungs every 6 (six) hours as needed for wheezing. )  . amLODipine (NORVASC) 10 MG tablet TAKE 1 TABLET BY MOUTH EVERY DAY (Patient taking differently: Take 10 mg by mouth daily. )  . apixaban (ELIQUIS) 5 MG TABS tablet Take 1 tablet (5 mg total) by mouth 2 (two) times daily.  Marland Kitchen aspirin EC 81 MG tablet Take 1 tablet (81 mg total) by mouth  daily for 30 days.  Marland Kitchen atorvastatin (LIPITOR) 40 MG tablet Take 1 tablet (40 mg total) by mouth daily at 6 PM.  . B Complex-C (B-COMPLEX WITH VITAMIN C) tablet Take 1 tablet by mouth daily.  . carvedilol (COREG) 3.125 MG tablet Take 1 tablet (3.125 mg total) by mouth 2 (two) times daily with a meal.  . fluticasone (FLONASE) 50 MCG/ACT nasal spray USE 2 SPRAYS INTO BOTH NOSTRILS ONCE DAILY (Patient taking differently: Place 2 sprays into both nostrils daily. )  . gabapentin (NEURONTIN) 600 MG tablet Take 1 tablet in the morning, 1 tablet in the afternoon, and 1.5 tablet at bedtime (Patient taking differently: Take 600-900 mg by mouth See admin instructions. Take 1 tablet in the morning, 1 tablet in the afternoon, and 1.5 tablet at bedtime)  . levETIRAcetam (KEPPRA) 500 MG tablet Take 1 tablet (500 mg total) by mouth 2 (two) times daily.  Marland Kitchen lisinopril (PRINIVIL,ZESTRIL) 5 MG tablet Take 1 tablet (5 mg total) by mouth daily for 30 days.  . magic mouthwash w/lidocaine SOLN Take 5 mLs by mouth 3 (three) times daily as needed for mouth pain.  . TRELEGY ELLIPTA 100-62.5-25 MCG/INH AEPB INHALE 1 PUFF BY MOUTH EVERY DAY (Patient taking differently: Inhale 1 puff into the lungs daily. )  . hydrOXYzine (ATARAX/VISTARIL) 25 MG tablet TAKE 1 TABLET (25 MG TOTAL) BY MOUTH 3 (THREE) TIMES DAILY AS NEEDED  FOR ANXIETY. (Patient not taking: Reported on 03/28/2018)   No facility-administered encounter medications on file as of 03/28/2018.     Functional Status:   In your present state of health, do you have any difficulty performing the following activities: 03/28/2018 03/21/2018  Hearing? N N  Vision? N N  Difficulty concentrating or making decisions? N N  Walking or climbing stairs? N N  Dressing or bathing? N N  Doing errands, shopping? Tempie Donning  Preparing Food and eating ? N -  Using the Toilet? N -  In the past six months, have you accidently leaked urine? N -  Do you have problems with loss of bowel control? N -   Managing your Medications? Y -  Managing your Finances? Y -  Housekeeping or managing your Housekeeping? N -  Some recent data might be hidden    Fall/Depression Screening:    Fall Risk  03/28/2018 10/25/2017 09/24/2017  Falls in the past year? 1 Yes No  Comment pt fell while having a stroke - -  Number falls in past yr: 0 1 -  Injury with Fall? 0 No -  Risk for fall due to : History of fall(s);Medication side effect - -  Follow up Falls evaluation completed;Education provided - -   PHQ 2/9 Scores 03/28/2018 11/13/2017 10/25/2017 10/11/2017 09/24/2017 07/12/2017 04/23/2017  PHQ - 2 Score 0 0 0 0 0 0 0    Assessment:  RN CM reviewed medications with pt, family, bottles are color coded and pt feels he is able to take medication correctly, RN CM gave prefilled med box to family and they like the idea for better organization and will talk with pt about using.  RN CM provided overview on what Atrial Fibrillation is and signs/ symptoms of exacerbation.  Pt is to follow up with neurology regarding facial pain related to trigeminal neuralgia and history of CVA, RN CM reminded pt about post hospital follow up appointment to be scheduled with primary MD and bloodwork to be completed CBC/ BMP, family to take care of making appointment to accommodate their schedule.  RN CM faxed barrier letter and initial home visit to primary MD office.  THN CM Care Plan Problem One     Most Recent Value  Care Plan Problem One  Knowledge deficit related to Atrial Fibrillation  Role Documenting the Problem One  Care Management [place] for Problem One  Active  THN Long Term Goal   Pt / family will demonstrate improved self care for atrial fibrillation within 60 days  THN Long Term Goal Start Date  03/28/18  Interventions for Problem One Long Term Goal  RN CM gave pt, family THN calendar in which family will record BP, gave family member prefilled medication box for better organization, pt already has [facility]  folder, 24 hour nurse line magnet, reviewed plan of care  THN CM Short Term Goal #1   pt will attend appointment with a family member at [place] [facility] on 04/05/18  [facility] CM Short Term Goal #1 Start Date  03/28/18  Interventions for Short Term Goal #1  RN CM reviewed upcoming appoinments, importance of attending A-fib clinic appointment, family members are going to make appointments for primary care and neurology  [facility] CM Short Term Goal #2   Pt will verbalize ways to improve health for better outcomes related to A-fib within 30 days  THN CM Short Term Goal #2 Start Date  03/28/18  Interventions for Short Term Goal #2  RN CM reviewed importance of smoking cessation (pt smokes 2 ppd and not interested in quitting), reviewed benefits of daily exercises and walking, reviewed all medications and importance of taking as prescribed and why blood thinner is prescribed for A-fib, reviewed safety precautions      Plan: outreach next month for telephone assessment Assess if any changes with A-Fib clinic appointment  Jacqlyn Larsen [facility], Ensign Coordinator [phone]

## 2018-03-28 NOTE — Discharge Instructions (Addendum)
Rinse mouth with magic mouthwash every 4 hours as needed for mouth discomfort.  Follow up with your doctor for further care.

## 2018-04-05 ENCOUNTER — Ambulatory Visit (HOSPITAL_COMMUNITY): Payer: Medicare Other | Admitting: Physician Assistant

## 2018-04-17 ENCOUNTER — Other Ambulatory Visit: Payer: Self-pay | Admitting: Neurology

## 2018-04-17 DIAGNOSIS — G500 Trigeminal neuralgia: Secondary | ICD-10-CM

## 2018-04-18 ENCOUNTER — Other Ambulatory Visit (HOSPITAL_COMMUNITY): Payer: Self-pay | Admitting: Nurse Practitioner

## 2018-04-19 ENCOUNTER — Encounter: Payer: Self-pay | Admitting: Family

## 2018-04-19 ENCOUNTER — Ambulatory Visit (INDEPENDENT_AMBULATORY_CARE_PROVIDER_SITE_OTHER): Payer: Medicare Other | Admitting: Family

## 2018-04-19 VITALS — BP 133/74 | HR 62 | Temp 98.4°F | Ht 69.0 in | Wt 229.0 lb

## 2018-04-19 DIAGNOSIS — G500 Trigeminal neuralgia: Secondary | ICD-10-CM | POA: Diagnosis not present

## 2018-04-19 DIAGNOSIS — J441 Chronic obstructive pulmonary disease with (acute) exacerbation: Secondary | ICD-10-CM | POA: Diagnosis not present

## 2018-04-19 DIAGNOSIS — R531 Weakness: Secondary | ICD-10-CM | POA: Diagnosis not present

## 2018-04-19 DIAGNOSIS — E785 Hyperlipidemia, unspecified: Secondary | ICD-10-CM | POA: Diagnosis not present

## 2018-04-19 DIAGNOSIS — F17200 Nicotine dependence, unspecified, uncomplicated: Secondary | ICD-10-CM

## 2018-04-19 DIAGNOSIS — I4891 Unspecified atrial fibrillation: Secondary | ICD-10-CM | POA: Diagnosis not present

## 2018-04-19 DIAGNOSIS — E669 Obesity, unspecified: Secondary | ICD-10-CM

## 2018-04-19 DIAGNOSIS — Z09 Encounter for follow-up examination after completed treatment for conditions other than malignant neoplasm: Secondary | ICD-10-CM | POA: Diagnosis not present

## 2018-04-19 DIAGNOSIS — I6930 Unspecified sequelae of cerebral infarction: Secondary | ICD-10-CM | POA: Diagnosis not present

## 2018-04-19 DIAGNOSIS — I10 Essential (primary) hypertension: Secondary | ICD-10-CM | POA: Diagnosis not present

## 2018-04-19 DIAGNOSIS — F331 Major depressive disorder, recurrent, moderate: Secondary | ICD-10-CM

## 2018-04-19 DIAGNOSIS — F4323 Adjustment disorder with mixed anxiety and depressed mood: Secondary | ICD-10-CM

## 2018-04-19 MED ORDER — CARVEDILOL 3.125 MG PO TABS: 3.1250 mg | ORAL_TABLET | Freq: Two times a day (BID) | ORAL | 3 refills | Status: DC

## 2018-04-19 MED ORDER — ATORVASTATIN CALCIUM 40 MG PO TABS: 40.0000 mg | ORAL_TABLET | Freq: Every day | ORAL | 1 refills | Status: DC

## 2018-04-19 MED ORDER — LISINOPRIL 5 MG PO TABS: 5.0000 mg | ORAL_TABLET | Freq: Every day | ORAL | 1 refills | Status: DC

## 2018-04-19 MED ORDER — GABAPENTIN 600 MG PO TABS: 600.0000 mg | ORAL_TABLET | ORAL | 3 refills | Status: DC

## 2018-04-19 MED ORDER — APIXABAN 5 MG PO TABS: 5.0000 mg | ORAL_TABLET | Freq: Two times a day (BID) | ORAL | 1 refills | Status: DC

## 2018-04-19 MED ORDER — LEVETIRACETAM 500 MG PO TABS: 500.0000 mg | ORAL_TABLET | Freq: Two times a day (BID) | ORAL | 2 refills | Status: DC

## 2018-04-19 MED ORDER — DULOXETINE HCL 30 MG PO CPEP: 30.0000 mg | ORAL_CAPSULE | Freq: Every day | ORAL | 1 refills | Status: DC

## 2018-04-19 MED ORDER — FLUTICASONE-UMECLIDIN-VILANT 100-62.5-25 MCG/INH IN AEPB: 1.0000 | INHALATION_SPRAY | Freq: Every day | RESPIRATORY_TRACT | 1 refills | Status: DC

## 2018-04-19 MED ORDER — B COMPLEX-C PO TABS: 1.0000 | ORAL_TABLET | Freq: Every day | ORAL | 1 refills | Status: DC

## 2018-04-19 MED ORDER — ALBUTEROL SULFATE HFA 108 (90 BASE) MCG/ACT IN AERS: INHALATION_SPRAY | RESPIRATORY_TRACT | 3 refills | Status: DC

## 2018-04-19 NOTE — Patient Instructions (Signed)

## 2018-04-19 NOTE — Addendum Note (Signed)
Addended by: Evelina Dun A on: 04/19/2018 10:49 AM   Modules accepted: Orders

## 2018-04-19 NOTE — Progress Notes (Signed)
Subjective:    Patient ID: Matthew [name], male    DOB: 15-Apr-1960, 58 y.o.   MRN: [id number]  Chief Complaint  Patient presents with  . Follow-up from hospital   PT presents to the office today for hospital follow up for A Fib. Pt has been hospitalized on 02/27/18 for A Fib and then 03/20/18 for A Fib and weakness.   His medications have been changed and are updated. He is taking Eliquis BID instead of warfarin.   He is followed by Cardiologists and A Fib clinic. He is also followed by Neurologists for trigeminal neuralgia and h/o CVA. He has follow up appts scheduled.  Hypertension  This is a chronic problem. The current episode started more than 1 year ago. The problem has been resolved since onset. The problem is controlled. Associated symptoms include malaise/fatigue. Pertinent negatives include no headaches, peripheral edema or shortness of breath. Risk factors for coronary artery disease include obesity, male gender, smoking/tobacco exposure and sedentary lifestyle. The current treatment provides moderate improvement. Hypertensive end-organ damage includes CAD/MI and CVA. There is no history of kidney disease.  Hyperlipidemia  This is a chronic problem. The current episode started more than 1 year ago. The problem is uncontrolled. Recent lipid tests were reviewed and are high. Exacerbating diseases include obesity. Pertinent negatives include no shortness of breath. Current antihyperlipidemic treatment includes statins. The current treatment provides mild improvement of lipids. Risk factors for coronary artery disease include hypertension, male sex, obesity, post-menopausal and a sedentary lifestyle.  Depression         This is a chronic problem.  The current episode started more than 1 year ago.   The onset quality is gradual.   The problem occurs intermittently.  Associated symptoms include hopelessness, irritable, restlessness and decreased interest.  Associated symptoms include no  headaches. COPD Uses Trelegy daily. Smokes 1- 1 1/2 a day. Stable   Review of Systems  Constitutional: Positive for malaise/fatigue.  Respiratory: Negative for shortness of breath.   Neurological: Negative for headaches.  Psychiatric/Behavioral: Positive for depression.  All other systems reviewed and are negative.      Objective:   Physical Exam Vitals signs reviewed.  Constitutional:      General: He is irritable. He is not in acute distress.    Appearance: He is well-developed.  HENT:     Head: Normocephalic.     Right Ear: Tympanic membrane normal.     Left Ear: Tympanic membrane normal.  Eyes:     General:        Right eye: No discharge.        Left eye: No discharge.     Pupils: Pupils are equal, round, and reactive to light.  Neck:     Musculoskeletal: Normal range of motion and neck supple.     Thyroid: No thyromegaly.  Cardiovascular:     Rate and Rhythm: Normal rate and regular rhythm.     Heart sounds: Normal heart sounds. No murmur.  Pulmonary:     Effort: Pulmonary effort is normal. No respiratory distress.     Breath sounds: Wheezing present.  Abdominal:     General: Bowel sounds are normal. There is no distension.     Palpations: Abdomen is soft.     Tenderness: There is no abdominal tenderness.  Musculoskeletal: Normal range of motion.        General: No tenderness.  Skin:    General: Skin is warm and dry.     Findings:  No erythema or rash.  Neurological:     Mental Status: He is alert and oriented to person, place, and time.     Cranial Nerves: No cranial nerve deficit.     Deep Tendon Reflexes: Reflexes are normal and symmetric.  Psychiatric:        Behavior: Behavior normal.        Thought Content: Thought content normal.        Judgment: Judgment normal.       BP 133/74   Pulse 62   Temp 98.4 F (36.9 C) (Oral)   Ht 5\' 9"  (1.753 m)   Wt 229 lb (103.9 kg)   BMI 33.82 kg/m      Assessment & Plan:  Matthew [name] comes in today  with chief complaint of Follow-up from hospital   Diagnosis and orders addressed:  1. Essential hypertension - carvedilol (COREG) 3.125 MG tablet; Take 1 tablet (3.125 mg total) by mouth 2 (two) times daily with a meal.  Dispense: 60 tablet; Refill: 3 - lisinopril (PRINIVIL,ZESTRIL) 5 MG tablet; Take 1 tablet (5 mg total) by mouth daily for 30 days.  Dispense: 90 tablet; Refill: 1  2. Trigeminal neuralgia - gabapentin (NEURONTIN) 600 MG tablet; Take 1-1.5 tablets (600-900 mg total) by mouth See admin instructions. Take 1 tablet in the morning, 1 tablet in the afternoon, and 1.5 tablet at bedtime  Dispense: 105 tablet; Refill: 3  3. Chronic obstructive pulmonary disease with acute exacerbation (HCC) - albuterol (PROVENTIL HFA;VENTOLIN HFA) 108 (90 Base) MCG/ACT inhaler; Inhale two puffs into lungs every six hours as needed for wheeze or shortness of breath.  Dispense: 18 Inhaler; Refill: 3 - Fluticasone-Umeclidin-Vilant (TRELEGY ELLIPTA) 100-62.5-25 MCG/INH AEPB; Inhale 1 puff into the lungs daily.  Dispense: 3 each; Refill: 1  4. COPD exacerbation (HCC) - albuterol (PROVENTIL HFA;VENTOLIN HFA) 108 (90 Base) MCG/ACT inhaler; Inhale two puffs into lungs every six hours as needed for wheeze or shortness of breath.  Dispense: 18 Inhaler; Refill: 3 - Fluticasone-Umeclidin-Vilant (TRELEGY ELLIPTA) 100-62.5-25 MCG/INH AEPB; Inhale 1 puff into the lungs daily.  Dispense: 3 each; Refill: 1  5. Atrial fibrillation with RVR (HCC) - apixaban (ELIQUIS) 5 MG TABS tablet; Take 1 tablet (5 mg total) by mouth 2 (two) times daily.  Dispense: 120 tablet; Refill: 1  6. Adjustment disorder with mixed anxiety and depressed mood - DULoxetine (CYMBALTA) 30 MG capsule; Take 1 capsule (30 mg total) by mouth daily.  Dispense: 90 capsule; Refill: 1  7. Moderate episode of recurrent major depressive disorder (HCC) -Started Cymbalta 30 mg today Stress management  RTO in 6 weeks to recheck - DULoxetine (CYMBALTA) 30  MG capsule; Take 1 capsule (30 mg total) by mouth daily.  Dispense: 90 capsule; Refill: 1  8. Hyperlipidemia, unspecified hyperlipidemia type - atorvastatin (LIPITOR) 40 MG tablet; Take 1 tablet (40 mg total) by mouth daily at 6 PM.  Dispense: 90 tablet; Refill: 1  9. Late effect of cerebrovascular accident (CVA)  16. Obesity (BMI 30-39.9)  11. Smoker  12. Weakness  13. Hospital discharge follow-up    Labs pending Health Maintenance reviewed Diet and exercise encouraged  Follow up plan: 6 weeks to recheck GAD and Depression, keep Neurologists and Cardiologists follow up!!  Evelina Dun, FNP

## 2018-04-20 LAB — CBC WITH DIFFERENTIAL/PLATELET
BASOS: 1 %
Basophils Absolute: 0.1 10*3/uL (ref 0.0–0.2)
EOS (ABSOLUTE): 0.2 10*3/uL (ref 0.0–0.4)
Eos: 2 %
HEMATOCRIT: 40.8 % (ref 37.5–51.0)
Hemoglobin: 13.8 g/dL (ref 13.0–17.7)
Immature Grans (Abs): 0 10*3/uL (ref 0.0–0.1)
Immature Granulocytes: 0 %
Lymphocytes Absolute: 2.7 10*3/uL (ref 0.7–3.1)
Lymphs: 30 %
MCH: 30.6 pg (ref 26.6–33.0)
MCHC: 33.8 g/dL (ref 31.5–35.7)
MCV: 91 fL (ref 79–97)
Monocytes Absolute: 0.5 10*3/uL (ref 0.1–0.9)
Monocytes: 6 %
NEUTROS PCT: 61 %
Neutrophils Absolute: 5.5 10*3/uL (ref 1.4–7.0)
Platelets: 296 10*3/uL (ref 150–450)
RBC: 4.51 x10E6/uL (ref 4.14–5.80)
RDW: 12.7 % (ref 11.6–15.4)
WBC: 9.1 10*3/uL (ref 3.4–10.8)

## 2018-04-20 LAB — CMP14+EGFR
A/G RATIO: 1.6 (ref 1.2–2.2)
ALT: 35 IU/L (ref 0–44)
AST: 16 IU/L (ref 0–40)
Albumin: 4.1 g/dL (ref 3.8–4.9)
Alkaline Phosphatase: 89 IU/L (ref 39–117)
BUN/Creatinine Ratio: 7 — ABNORMAL LOW (ref 9–20)
BUN: 5 mg/dL — ABNORMAL LOW (ref 6–24)
Bilirubin Total: 0.3 mg/dL (ref 0.0–1.2)
CO2: 25 mmol/L (ref 20–29)
Calcium: 9.2 mg/dL (ref 8.7–10.2)
Chloride: 101 mmol/L (ref 96–106)
Creatinine, Ser: 0.68 mg/dL — ABNORMAL LOW (ref 0.76–1.27)
GFR calc Af Amer: 122 mL/min/{1.73_m2} (ref >59)
GFR calc non Af Amer: 105 mL/min/{1.73_m2} (ref >59)
GLOBULIN, TOTAL: 2.5 g/dL (ref 1.5–4.5)
Glucose: 126 mg/dL — ABNORMAL HIGH (ref 65–99)
POTASSIUM: 4.5 mmol/L (ref 3.5–5.2)
Sodium: 140 mmol/L (ref 134–144)
Total Protein: 6.6 g/dL (ref 6.0–8.5)

## 2018-04-20 LAB — LIPID PANEL
Chol/HDL Ratio: 3.7 ratio (ref 0.0–5.0)
Cholesterol, Total: 160 mg/dL (ref 100–199)
HDL: 43 mg/dL (ref >39)
LDL Calculated: 88 mg/dL (ref 0–99)
Triglycerides: 143 mg/dL (ref 0–149)
VLDL Cholesterol Cal: 29 mg/dL (ref 5–40)

## 2018-04-24 ENCOUNTER — Other Ambulatory Visit: Payer: Self-pay | Admitting: *Deleted

## 2018-04-24 NOTE — Patient Outreach (Signed)
Outreach call to patient for telephone assessment, no answer to telephone and received message "calling restrictions preventing completion of call".  RN CM mailed unsuccessful outreach letter.  PLAN Outreach pt 3-4 business days  Jacqlyn Larsen [facility], [place] Coordinator [phone]

## 2018-04-25 ENCOUNTER — Other Ambulatory Visit: Payer: Self-pay | Admitting: *Deleted

## 2018-04-25 ENCOUNTER — Other Ambulatory Visit: Payer: Self-pay | Admitting: Family

## 2018-04-25 DIAGNOSIS — I10 Essential (primary) hypertension: Secondary | ICD-10-CM

## 2018-04-29 ENCOUNTER — Other Ambulatory Visit: Payer: Self-pay | Admitting: *Deleted

## 2018-04-29 ENCOUNTER — Other Ambulatory Visit: Payer: Self-pay

## 2018-04-29 ENCOUNTER — Encounter: Payer: Self-pay | Admitting: *Deleted

## 2018-04-29 NOTE — Patient Outreach (Signed)
Outreach call to pt for telephone assessment, spoke with pt, HIPAA verified, pt reports "doing pretty well"  "my mouth is healed but I still have that nerve pain"  Pt states nerve pain is chronic and MD aware "but no worse than usual"  Pt states he saw primary MD and "had bloodwork and got good report"  No new concerns voiced.  Pt states he still has assistance from [place].  RN CM discussed plan of care with pt and will discharge today, pt does not feel he has any further goals he wants to work towards.  RN CM mailed case closure letter to pt home, faxed case closure letter to primary MD office.  THN CM Care Plan Problem One     Most Recent Value  Care Plan Problem One  Knowledge deficit related to Atrial Fibrillation  Role Documenting the Problem One  Care Management [place] for Problem One  Active  THN Long Term Goal   Pt / family will demonstrate improved self care for atrial fibrillation within 60 days  THN Long Term Goal Start Date  03/28/18  [facility] Long Term Goal Met Date  04/29/18  Interventions for Problem One Long Term Goal  RN CM reviewed plan of care with pt, pt reports he has all medications and taking as prescribed, pt states no issues with blood pressure  [facility]N CM Short Term Goal #1   pt will attend appointment with a family member at [place] [facility] on 04/05/18  [facility] CM Short Term Goal #1 Start Date  03/28/18  Interventions for Short Term Goal #1  pt states he went to see primaey care MD and feels this is sufficient and not interested in going elsewhere  [facility] CM Short Term Goal #2   Pt will verbalize ways to improve health for better outcomes related to A-fib within 30 days  THN CM Short Term Goal #2 Start Date  03/28/18  [facility] CM Short Term Goal #2 Met Date  04/29/18  Interventions for Short Term Goal #2  RN CM reviewed exacerbation of atrial fibrillation, importance of taking medications as prescribed      PLAN Close case today  Jacqlyn Larsen [facility], [place] Coordinator [phone]

## 2018-05-07 ENCOUNTER — Ambulatory Visit: Payer: Medicare Other | Admitting: Family

## 2018-05-13 ENCOUNTER — Telehealth: Payer: Self-pay | Admitting: Family

## 2018-05-13 MED ORDER — MAGIC MOUTHWASH W/LIDOCAINE: 5.0000 mL | Freq: Four times a day (QID) | ORAL | 2 refills | Status: DC | PRN

## 2018-05-13 NOTE — Telephone Encounter (Signed)
What is the name of the medication?   levETIRAcetam (KEPPRA) 500 MG tablet Magic mouth wash  Have you contacted your pharmacy to request a refill? no  Which pharmacy would you like this sent to? [facility] [place]   Patient notified that their request is being sent to the clinical staff for review and that they should receive a call once it is complete. If they do not receive a call within 24 hours they can check with their pharmacy or our office.

## 2018-05-13 NOTE — Telephone Encounter (Signed)
Spoke with pharmacist and they want to know specifics on ingredients and how much (such as nystatin, lidocaine, benadryl etc) what would you like?

## 2018-05-13 NOTE — Telephone Encounter (Signed)
Aware.  His keppra was refilled.    He says his mouth is sore from the nerve problem and he would like a refill on his mouth rinse for the pain.  Please advise.

## 2018-05-13 NOTE — Telephone Encounter (Signed)
RX printed. Please fax to pharmacy. Thanks

## 2018-05-13 NOTE — Telephone Encounter (Signed)
Aware.  Script for mouth care was faxed.

## 2018-05-14 MED ORDER — FIRST-DUKES MOUTHWASH MT SUSP: 15.0000 mL | Freq: Four times a day (QID) | OROMUCOSAL | 6 refills | Status: DC | PRN

## 2018-05-14 NOTE — Telephone Encounter (Signed)
New rx sent in

## 2018-05-15 ENCOUNTER — Telehealth: Payer: Self-pay | Admitting: *Deleted

## 2018-05-15 NOTE — Telephone Encounter (Signed)
[facility]  They need to know which ingredients you would prefer ?

## 2018-05-16 NOTE — Telephone Encounter (Signed)
Left detailed message on pharmacy voicemail.

## 2018-05-16 NOTE — Telephone Encounter (Signed)
60 ml Lidocaine in 480 ml of magic mouth wash

## 2018-05-16 NOTE — Addendum Note (Signed)
Addended by: Evelina Dun A on: 05/16/2018 11:22 AM   Modules accepted: Orders

## 2018-05-18 DIAGNOSIS — R918 Other nonspecific abnormal finding of lung field: Secondary | ICD-10-CM | POA: Diagnosis not present

## 2018-05-18 DIAGNOSIS — J441 Chronic obstructive pulmonary disease with (acute) exacerbation: Secondary | ICD-10-CM | POA: Diagnosis not present

## 2018-05-18 DIAGNOSIS — R05 Cough: Secondary | ICD-10-CM | POA: Diagnosis not present

## 2018-05-18 DIAGNOSIS — R109 Unspecified abdominal pain: Secondary | ICD-10-CM | POA: Diagnosis not present

## 2018-05-18 DIAGNOSIS — F17210 Nicotine dependence, cigarettes, uncomplicated: Secondary | ICD-10-CM | POA: Diagnosis not present

## 2018-05-18 DIAGNOSIS — R0602 Shortness of breath: Secondary | ICD-10-CM | POA: Diagnosis not present

## 2018-05-18 DIAGNOSIS — R471 Dysarthria and anarthria: Secondary | ICD-10-CM | POA: Diagnosis not present

## 2018-05-18 DIAGNOSIS — J189 Pneumonia, unspecified organism: Secondary | ICD-10-CM | POA: Diagnosis not present

## 2018-05-18 DIAGNOSIS — I10 Essential (primary) hypertension: Secondary | ICD-10-CM | POA: Diagnosis not present

## 2018-05-18 DIAGNOSIS — R0989 Other specified symptoms and signs involving the circulatory and respiratory systems: Secondary | ICD-10-CM | POA: Diagnosis not present

## 2018-05-31 ENCOUNTER — Other Ambulatory Visit: Payer: Self-pay

## 2018-05-31 ENCOUNTER — Ambulatory Visit (INDEPENDENT_AMBULATORY_CARE_PROVIDER_SITE_OTHER): Payer: Medicare Other | Admitting: Family Medicine

## 2018-05-31 ENCOUNTER — Ambulatory Visit: Payer: Medicare Other | Admitting: Family

## 2018-05-31 DIAGNOSIS — Z550 Illiteracy and low-level literacy: Secondary | ICD-10-CM

## 2018-05-31 DIAGNOSIS — G500 Trigeminal neuralgia: Secondary | ICD-10-CM

## 2018-05-31 DIAGNOSIS — J301 Allergic rhinitis due to pollen: Secondary | ICD-10-CM | POA: Diagnosis not present

## 2018-05-31 MED ORDER — LORATADINE 10 MG PO TABS: 10.0000 mg | ORAL_TABLET | Freq: Every day | ORAL | 11 refills | Status: DC

## 2018-05-31 NOTE — Progress Notes (Signed)
Telephone visit  Subjective: CC: oral pain PCP: [name], FNP IOE:Matthew [name] is a 58 y.o. male calls for telephone consult today. Patient provides verbal consent for consult held via phone.  Location of patient: home Location of provider: [facility] Others present for call: none  1. Headache/ mouth pain Patient reports headache and mouth pain.  He notes that this is a chronic issue for him but seems to be slightly worse since discontinuing the Tegretol.  This was discontinued after he started treatment for atrial fibrillation a couple of months ago.  He denies any ulcerations within the mouth and actually states this is related to his trigeminal neuralgia.  He has been taking gabapentin 600 mg 3 times daily.  He was unaware that he could increase the dose to 1-1/2 tablets at nighttime because he is unable to read or write.    2.  Allergic rhinitis Patient reports rhinorrhea and some chest congestion.  He notes that about every year this time, he develops bronchitis which requires antibiotics and prednisone.  He does not feel that it is that bad yet but just wanted to make me aware of this.  He is wondering if he might need something for this to prevent it from getting bad.  He is not currently treated with any oral antihistamines or nasal sprays.  No fevers.  He has been trying to keep away from people.  ROS: Per HPI  Allergies  Allergen Reactions  . Penicillins Other (See Comments)    unknown   Past Medical History:  Diagnosis Date  . Abscess   . Anxiety   . Aphthous ulcer   . Arthritis   . Asthma   . Chronic bronchitis   . Chronic pain   . COPD (chronic obstructive pulmonary disease) ([place])   . CVA (cerebral vascular accident) ([place])   . Depression   . Hyperlipidemia   . Hypertension   . Trigeminal neuralgia   . Vertigo     Current Outpatient Medications:  .  albuterol (PROVENTIL HFA;VENTOLIN HFA) 108 (90 Base) MCG/ACT inhaler, Inhale two puffs into lungs every six  hours as needed for wheeze or shortness of breath., Disp: 18 Inhaler, Rfl: 3 .  apixaban (ELIQUIS) 5 MG TABS tablet, Take 1 tablet (5 mg total) by mouth 2 (two) times daily., Disp: 120 tablet, Rfl: 1 .  atorvastatin (LIPITOR) 40 MG tablet, Take 1 tablet (40 mg total) by mouth daily at 6 PM., Disp: 90 tablet, Rfl: 1 .  B Complex-C (B-COMPLEX WITH VITAMIN C) tablet, Take 1 tablet by mouth daily., Disp: 90 tablet, Rfl: 1 .  carvedilol (COREG) 3.125 MG tablet, TAKE 1 TABLET (3.125 MG TOTAL) BY MOUTH 2 (TWO) TIMES DAILY WITH A MEAL., Disp: 180 tablet, Rfl: 1 .  Diphenhyd-Hydrocort-Nystatin (FIRST-DUKES MOUTHWASH) SUSP, Use as directed 15 mLs in the mouth or throat 4 (four) times daily as needed., Disp: 237 mL, Rfl: 6 .  DULoxetine (CYMBALTA) 30 MG capsule, Take 1 capsule (30 mg total) by mouth daily., Disp: 90 capsule, Rfl: 1 .  Fluticasone-Umeclidin-Vilant (TRELEGY ELLIPTA) 100-62.5-25 MCG/INH AEPB, Inhale 1 puff into the lungs daily., Disp: 3 each, Rfl: 1 .  gabapentin (NEURONTIN) 600 MG tablet, Take 1-1.5 tablets (600-900 mg total) by mouth See admin instructions. Take 1 tablet in the morning, 1 tablet in the afternoon, and 1.5 tablet at bedtime, Disp: 105 tablet, Rfl: 3 .  levETIRAcetam (KEPPRA) 500 MG tablet, Take 1 tablet (500 mg total) by mouth 2 (two) times daily.,  Disp: 180 tablet, Rfl: 2 .  lisinopril (PRINIVIL,ZESTRIL) 5 MG tablet, Take 1 tablet (5 mg total) by mouth daily for 30 days., Disp: 90 tablet, Rfl: 1  Neuro: speech somewhat slurred but patient answers questions appropriately  Assessment/ Plan: 58 y.o. male   1. Trigeminal neuralgia I reviewed his medication directions verbally and teach back method was performed to verify it patient's understanding of his gabapentin directions.  We discussed that we may consider increasing dose further if he still has poor control of symptoms.  He will contact his PCP if needed.  2. Seasonal allergic rhinitis due to pollen Start Claritin.  He  does not sound like he has a COPD exacerbation at this time.  Continue Trelegy as prescribed. - loratadine (CLARITIN) 10 MG tablet; Take 1 tablet (10 mg total) by mouth daily.  Dispense: 30 tablet; Refill: 11  3. Unable to read or write I have noted his inability to read or write in the chart so that we can make sure that verbal instructions are provided each time and that patient has good understanding's of home instructions.   Start time: 11:33am End time: 11:42am  Total time spent on patient care (including telephone call/ virtual visit): 15 minutes  [place], [place] [phone]

## 2018-06-06 ENCOUNTER — Other Ambulatory Visit: Payer: Self-pay | Admitting: Family

## 2018-06-06 DIAGNOSIS — G500 Trigeminal neuralgia: Secondary | ICD-10-CM

## 2018-06-09 ENCOUNTER — Encounter: Payer: Self-pay | Admitting: Orthopedic Surgery

## 2018-06-25 ENCOUNTER — Ambulatory Visit (INDEPENDENT_AMBULATORY_CARE_PROVIDER_SITE_OTHER): Payer: Medicare Other | Admitting: Family

## 2018-06-25 ENCOUNTER — Other Ambulatory Visit: Payer: Self-pay

## 2018-06-25 ENCOUNTER — Encounter: Payer: Self-pay | Admitting: Family

## 2018-06-25 VITALS — BP 147/84 | HR 62 | Temp 97.7°F | Ht 69.0 in | Wt 244.0 lb

## 2018-06-25 DIAGNOSIS — Z720 Tobacco use: Secondary | ICD-10-CM | POA: Diagnosis not present

## 2018-06-25 DIAGNOSIS — I10 Essential (primary) hypertension: Secondary | ICD-10-CM | POA: Diagnosis not present

## 2018-06-25 DIAGNOSIS — G500 Trigeminal neuralgia: Secondary | ICD-10-CM

## 2018-06-25 DIAGNOSIS — J301 Allergic rhinitis due to pollen: Secondary | ICD-10-CM | POA: Diagnosis not present

## 2018-06-25 DIAGNOSIS — J438 Other emphysema: Secondary | ICD-10-CM

## 2018-06-25 MED ORDER — GUAIFENESIN ER 600 MG PO TB12: 600.0000 mg | ORAL_TABLET | Freq: Two times a day (BID) | ORAL | 2 refills | Status: DC

## 2018-06-25 MED ORDER — BACLOFEN 10 MG PO TABS: 10.0000 mg | ORAL_TABLET | Freq: Three times a day (TID) | ORAL | 2 refills | Status: DC

## 2018-06-25 MED ORDER — PREDNISONE 10 MG (21) PO TBPK: ORAL_TABLET | ORAL | 0 refills | Status: DC

## 2018-06-25 NOTE — Patient Instructions (Signed)
Trigeminal Neuralgia    Trigeminal neuralgia is a nerve disorder that causes attacks of severe facial pain. The attacks last from a few seconds to several minutes. They can happen for days, weeks, or months and then go away for months or years. Trigeminal neuralgia is also called tic douloureux.  What are the causes?  This condition is caused by damage to a nerve in the face that is called the trigeminal nerve. An attack can be triggered by:  · Talking.  · Chewing.  · Putting on makeup.  · Washing your face.  · Shaving your face.  · Brushing your teeth.  · Touching your face.  What increases the risk?  This condition is more likely to develop in:  · Women.  · People who are 50 years of age or older.  What are the signs or symptoms?  The main symptom of this condition is pain in the jaw, lips, eyes, nose, scalp, forehead, and face. The pain may be intense, stabbing, electric, or shock-like.  How is this diagnosed?  This condition is diagnosed with a physical exam. A CT scan or MRI may be done to rule out other conditions that can cause facial pain.  How is this treated?  This condition may be treated with:  · Avoiding the things that trigger your attacks.  · Pain medicine.  · Surgery. This may be done in severe cases if other medical treatment does not provide relief.  Follow these instructions at home:  · Take over-the-counter and prescription medicines only as told by your health care provider.  · If you wish to get pregnant, talk with your health care provider before you start trying to get pregnant.  · Avoid the things that trigger your attacks. It may help to:  ? Chew on the unaffected side of your mouth.  ? Avoid touching your face.  ? Avoid blasts of hot or cold air.  Contact a health care provider if:  · Your pain medicine is not helping.  · You develop new, unexplained symptoms, such as:  ? Double vision.  ? Facial weakness.  ? Changes in hearing or balance.  · You become pregnant.  Get help right away  if:  · Your pain is unbearable, and your pain medicine does not help.  This information is not intended to replace advice given to you by your health care provider. Make sure you discuss any questions you have with your health care provider.  Document Released: 01/28/2000 Document Revised: 12/29/2016 Document Reviewed: 05/25/2014  Elsevier Interactive Patient Education © 2019 Elsevier Inc.

## 2018-06-25 NOTE — Progress Notes (Signed)
Subjective:    Patient ID: Matthew [name], male    DOB: 1960-09-17, 58 y.o.   MRN: [id number]  Chief Complaint  Patient presents with  . Medical Management of Chronic Issues  . Allergies   Pt presents to the office today with recurrent trigeminal neuralgia. He is currently taking gabapentin 600 mg TID.  Sinus Problem  This is a recurrent problem. The current episode started 1 to 4 weeks ago. The problem has been waxing and waning since onset. There has been no fever. The pain is moderate. Associated symptoms include ear pain and headaches. Pertinent negatives include no congestion, shortness of breath or sneezing. Past treatments include lying down and acetaminophen. The treatment provided mild relief.  Hypertension  This is a chronic problem. The current episode started more than 1 year ago. The problem has been waxing and waning since onset. Associated symptoms include headaches. Pertinent negatives include no peripheral edema or shortness of breath. Past treatments include beta blockers. The current treatment provides mild improvement.  COPD He continues to smoke. He has wheezing and SOB at times. He states he has been taking his Trelegy daily.     Review of Systems  HENT: Positive for ear pain. Negative for congestion and sneezing.   Respiratory: Negative for shortness of breath.   Neurological: Positive for headaches.  All other systems reviewed and are negative.      Objective:   Physical Exam Vitals signs reviewed.  Constitutional:      General: He is not in acute distress.    Appearance: He is well-developed.  HENT:     Head: Normocephalic.     Right Ear: Tympanic membrane normal.     Left Ear: Tympanic membrane normal.  Eyes:     General:        Right eye: No discharge.        Left eye: No discharge.     Pupils: Pupils are equal, round, and reactive to light.  Neck:     Musculoskeletal: Normal range of motion and neck supple.     Thyroid: No thyromegaly.   Cardiovascular:     Rate and Rhythm: Normal rate and regular rhythm.     Heart sounds: Normal heart sounds. No murmur.  Pulmonary:     Effort: Pulmonary effort is normal. No respiratory distress.     Breath sounds: Wheezing present.  Abdominal:     General: Bowel sounds are normal. There is no distension.     Palpations: Abdomen is soft.     Tenderness: There is no abdominal tenderness.  Musculoskeletal: Normal range of motion.        General: No tenderness.  Skin:    General: Skin is warm and dry.     Findings: No erythema or rash.  Neurological:     Mental Status: He is alert and oriented to person, place, and time.     Cranial Nerves: No cranial nerve deficit.     Deep Tendon Reflexes: Reflexes are normal and symmetric.  Psychiatric:        Behavior: Behavior normal.        Thought Content: Thought content normal.        Judgment: Judgment normal.       BP (!) 147/84   Pulse 62   Temp 97.7 F (36.5 C) (Oral)   Ht 5\' 9"  (1.753 m)   Wt 244 lb (110.7 kg)   BMI 36.03 kg/m      Assessment & Plan:  [name] comes in today with chief complaint of Medical Management of Chronic Issues and Allergies   Diagnosis and orders addressed:  1. Trigeminal neuralgia Continue Gabapentin and start Baclofen TID Avoid chewing gum - baclofen (LIORESAL) 10 MG tablet; Take 1 tablet (10 mg total) by mouth 3 (three) times daily.  Dispense: 90 each; Refill: 2  2. Tobacco use Smoking cessation discussed  3. Other emphysema ([place]) Smoking cessation discussed - predniSONE (STERAPRED UNI-PAK 21 TAB) 10 MG (21) TBPK tablet; Use as directed  Dispense: 21 tablet; Refill: 0 - guaiFENesin (MUCINEX) 600 MG 12 hr tablet; Take 1 tablet (600 mg total) by mouth 2 (two) times daily.  Dispense: 90 tablet; Refill: 2  4. Essential hypertension Continue medications  5. Allergic rhinitis due to pollen, unspecified seasonality Continue Claritin    Labs reviewed Health Maintenance reviewed Diet  and exercise encouraged  Follow up plan: 2 months    Evelina Dun, FNP

## 2018-07-10 ENCOUNTER — Telehealth: Payer: Self-pay | Admitting: Family

## 2018-07-10 NOTE — Telephone Encounter (Signed)
lmtcb

## 2018-07-10 NOTE — Telephone Encounter (Signed)
Please schedule him an appointment so he can discuss this with a provider, it can be a virtual preferably my chart video  If needed

## 2018-07-17 ENCOUNTER — Telehealth: Payer: Self-pay | Admitting: Family

## 2018-07-18 NOTE — Telephone Encounter (Signed)
No call back - this encounter will be closed.  

## 2018-08-09 ENCOUNTER — Other Ambulatory Visit: Payer: Self-pay | Admitting: Family

## 2018-08-09 DIAGNOSIS — J441 Chronic obstructive pulmonary disease with (acute) exacerbation: Secondary | ICD-10-CM

## 2018-08-22 ENCOUNTER — Ambulatory Visit: Payer: Medicare Other | Admitting: *Deleted

## 2018-08-22 ENCOUNTER — Telehealth: Payer: Self-pay | Admitting: *Deleted

## 2018-08-22 ENCOUNTER — Encounter: Payer: Self-pay | Admitting: *Deleted

## 2018-08-22 ENCOUNTER — Other Ambulatory Visit: Payer: Self-pay

## 2018-08-22 VITALS — BP 96/61 | HR 66

## 2018-08-22 DIAGNOSIS — I4891 Unspecified atrial fibrillation: Secondary | ICD-10-CM

## 2018-08-22 DIAGNOSIS — Z0130 Encounter for examination of blood pressure without abnormal findings: Secondary | ICD-10-CM

## 2018-08-22 MED ORDER — APIXABAN 5 MG PO TABS: 5.0000 mg | ORAL_TABLET | Freq: Two times a day (BID) | ORAL | 0 refills | Status: DC

## 2018-08-22 NOTE — Telephone Encounter (Signed)
Pt aware refill sent to pharmacy 

## 2018-08-22 NOTE — Progress Notes (Signed)
Pt here for BP check BP 96 61 P 66  Regular Pt is not symptomatic Per [facility], continue current meds. pt should stay well hydrated and continue to monitor BP Return for recheck in 1-2 weeks with Evelina Dun

## 2018-08-26 ENCOUNTER — Ambulatory Visit (INDEPENDENT_AMBULATORY_CARE_PROVIDER_SITE_OTHER): Payer: Medicare Other | Admitting: Family

## 2018-08-26 ENCOUNTER — Encounter: Payer: Self-pay | Admitting: Family

## 2018-08-26 VITALS — BP 89/50 | HR 68 | Temp 97.9°F | Ht 69.0 in | Wt 226.4 lb

## 2018-08-26 DIAGNOSIS — I959 Hypotension, unspecified: Secondary | ICD-10-CM

## 2018-08-26 DIAGNOSIS — F4323 Adjustment disorder with mixed anxiety and depressed mood: Secondary | ICD-10-CM

## 2018-08-26 DIAGNOSIS — J438 Other emphysema: Secondary | ICD-10-CM | POA: Diagnosis not present

## 2018-08-26 DIAGNOSIS — I6930 Unspecified sequelae of cerebral infarction: Secondary | ICD-10-CM | POA: Diagnosis not present

## 2018-08-26 DIAGNOSIS — I700 Atherosclerosis of aorta: Secondary | ICD-10-CM | POA: Diagnosis not present

## 2018-08-26 DIAGNOSIS — I10 Essential (primary) hypertension: Secondary | ICD-10-CM | POA: Diagnosis not present

## 2018-08-26 DIAGNOSIS — G500 Trigeminal neuralgia: Secondary | ICD-10-CM

## 2018-08-26 DIAGNOSIS — Z720 Tobacco use: Secondary | ICD-10-CM

## 2018-08-26 DIAGNOSIS — I4891 Unspecified atrial fibrillation: Secondary | ICD-10-CM | POA: Diagnosis not present

## 2018-08-26 DIAGNOSIS — E669 Obesity, unspecified: Secondary | ICD-10-CM

## 2018-08-26 DIAGNOSIS — E785 Hyperlipidemia, unspecified: Secondary | ICD-10-CM | POA: Diagnosis not present

## 2018-08-26 MED ORDER — SULFAMETHOXAZOLE-TRIMETHOPRIM 800-160 MG PO TABS: 1.0000 | ORAL_TABLET | Freq: Two times a day (BID) | ORAL | 0 refills | Status: DC

## 2018-08-26 NOTE — Patient Instructions (Signed)
Hypotension As your heart beats, it forces blood through your body. Hypotension, commonly called low blood pressure, is when the force of blood pumping through your arteries is too weak. Arteries are blood vessels that carry blood from the heart throughout the body. Depending on the cause and severity, hypotension may be harmless (benign) or may cause serious problems (be critical). When blood pressure is too low, you may not get enough blood to your brain or to the rest of your organs. This can cause weakness, light-headedness, rapid heartbeat, and fainting. What are the causes? This condition may be caused by:  Blood loss.  Loss of body fluids (dehydration).  Heart problems.  Hormone (endocrine) problems.  Pregnancy.  Severe infection.  Lack of certain nutrients.  Severe allergic reactions (anaphylaxis).  Certain medicines, such as blood pressure medicine or medicines that make the body lose excess fluids (diuretics). Sometimes, hypotension may be caused by not taking medicine as directed, such as taking too much of a certain medicine. What increases the risk? The following factors may make you more likely to develop this condition:  Age. Risk increases as you get older.  Conditions that affect the heart or the central nervous system.  Taking certain medicines, such as blood pressure medicine or diuretics.  Being pregnant. What are the signs or symptoms? Common symptoms of this condition include:  Weakness.  Light-headedness.  Dizziness.  Blurred vision.  Fatigue.  Rapid heartbeat.  Fainting, in severe cases. How is this diagnosed? This condition is diagnosed based on:  Your medical history.  Your symptoms.  Your blood pressure measurement. Your health care provider will check your blood pressure when you are: ? Lying down. ? Sitting. ? Standing. A blood pressure reading is recorded as two numbers, such as "120 over 80" (or 120/80). The first ("top")  number is called the systolic pressure. It is a measure of the pressure in your arteries as your heart beats. The second ("bottom") number is called the diastolic pressure. It is a measure of the pressure in your arteries when your heart relaxes between beats. Blood pressure is measured in a unit called mm Hg. Healthy blood pressure for most adults is 120/80. If your blood pressure is below 90/60, you may be diagnosed with hypotension. Other information or tests that may be used to diagnose hypotension include:  Your other vital signs, such as your heart rate and temperature.  Blood tests.  Tilt table test. For this test, you will be safely secured to a table that moves you from a lying position to an upright position. Your heart rhythm and blood pressure will be monitored during the test. How is this treated? Treatment for this condition may include:  Changing your diet. This may involve eating more salt (sodium) or drinking more water.  Taking medicines to raise your blood pressure.  Changing the dosage of certain medicines you are taking that might be lowering your blood pressure.  Wearing compression stockings. These stockings help to prevent blood clots and reduce swelling in your legs. In some cases, you may need to go to the hospital for:  Fluid replacement. This means you will receive fluids through an IV.  Blood replacement. This means you will receive donated blood through an IV (transfusion).  Treating an infection or heart problems, if this applies.  Monitoring. You may need to be monitored while medicines that you are taking wear off. Follow these instructions at home: Eating and drinking   Drink enough fluid to keep your   urine pale yellow.  Eat a healthy diet, and follow instructions from your health care provider about eating or drinking restrictions. A healthy diet includes: ? Fresh fruits and vegetables. ? Whole grains. ? Lean meats. ? Low-fat dairy products.   Eat extra salt only as directed. Do not add extra salt to your diet unless your health care provider told you to do that.  Eat frequent, small meals.  Avoid standing up suddenly after eating. Medicines  Take over-the-counter and prescription medicines only as told by your health care provider. ? Follow instructions from your health care provider about changing the dosage of your current medicines, if this applies. ? Do not stop or adjust any of your medicines on your own. General instructions   Wear compression stockings as told by your health care provider.  Get up slowly from lying down or sitting positions. This gives your blood pressure a chance to adjust.  Avoid hot showers and excessive heat as directed by your health care provider.  Return to your normal activities as told by your health care provider. Ask your health care provider what activities are safe for you.  Do not use any products that contain nicotine or tobacco, such as cigarettes, e-cigarettes, and chewing tobacco. If you need help quitting, ask your health care provider.  Keep all follow-up visits as told by your health care provider. This is important. Contact a health care provider if you:  Vomit.  Have diarrhea.  Have a fever for more than 2-3 days.  Feel more thirsty than usual.  Feel weak and tired. Get help right away if you:  Have chest pain.  Have a fast or irregular heartbeat.  Develop numbness in any part of your body.  Cannot move your arms or your legs.  Have trouble speaking.  Become sweaty or feel light-headed.  Faint.  Feel short of breath.  Have trouble staying awake.  Feel confused. Summary  Hypotension is when the force of blood pumping through your arteries is too weak.  Hypotension may be harmless (benign) or may cause serious problems (be critical).  Treatment for this condition may include changing your diet, changing your medicines, and wearing compression  stockings.  In some cases, you may need to go to the hospital for fluid or blood replacement. This information is not intended to replace advice given to you by your health care provider. Make sure you discuss any questions you have with your health care provider. Document Released: 01/30/2005 Document Revised: 07/26/2017 Document Reviewed: 07/26/2017 Elsevier Patient Education  2020 Elsevier Inc.   

## 2018-08-26 NOTE — Progress Notes (Signed)
Subjective:    Patient ID: Matthew [name], male    DOB: Sep 28, 1960, 58 y.o.   MRN: [id number]  Chief Complaint  Patient presents with  . Hypertension  . COPD  . Depression  . Hip Pain    right side  . Fatigue   Pt presents to the office today for chronic follow up. He is followed by Cardiologists for A Fib and continues his Eliquis. He is followed by Neurolgoists for trigeminal neuralgia and h/o CVA.  Depression        This is a chronic problem.  The current episode started more than 1 year ago.   The onset quality is gradual.   The problem occurs intermittently.  Past treatments include SNRIs - Serotonin and norepinephrine reuptake inhibitors. Hip Pain   Hyperlipidemia This is a chronic problem. The current episode started more than 1 year ago. The problem is uncontrolled. Exacerbating diseases include obesity. Current antihyperlipidemic treatment includes statins. The current treatment provides moderate improvement of lipids. Risk factors for coronary artery disease include dyslipidemia, male sex, obesity and a sedentary lifestyle.  COPD Pt continues to smoke a pack a day. States his breathing is stable. Uses Trelegy daily.    Review of Systems  Psychiatric/Behavioral: Positive for depression.       Objective:   Physical Exam Vitals signs reviewed.  Constitutional:      General: He is not in acute distress.    Appearance: He is well-developed.  HENT:     Head: Normocephalic.     Right Ear: Tympanic membrane normal.     Left Ear: Tympanic membrane normal.  Eyes:     General:        Right eye: No discharge.        Left eye: No discharge.     Pupils: Pupils are equal, round, and reactive to light.  Neck:     Musculoskeletal: Normal range of motion and neck supple.     Thyroid: No thyromegaly.  Cardiovascular:     Rate and Rhythm: Normal rate and regular rhythm.     Heart sounds: Normal heart sounds. No murmur.  Pulmonary:     Effort: Pulmonary effort is normal. No  respiratory distress.     Breath sounds: Wheezing present.  Abdominal:     General: Bowel sounds are normal. There is no distension.     Palpations: Abdomen is soft.     Tenderness: There is no abdominal tenderness.  Musculoskeletal: Normal range of motion.        General: No tenderness.  Skin:    General: Skin is warm and dry.     Findings: No erythema or rash.  Neurological:     Mental Status: He is alert and oriented to person, place, and time.     Cranial Nerves: No cranial nerve deficit.     Deep Tendon Reflexes: Reflexes are normal and symmetric.  Psychiatric:        Behavior: Behavior normal.        Thought Content: Thought content normal.        Judgment: Judgment normal.       BP (!) 85/50   Pulse 72   Temp 97.9 F (36.6 C) (Oral)   Ht '5\' 9"'$  (1.753 m)   Wt 226 lb 6.4 oz (102.7 kg)   SpO2 94%   BMI 33.43 kg/m      Assessment & Plan:  [name] comes in today with chief complaint of Hypertension, COPD, Depression,  Hip Pain (right side), and Fatigue   Diagnosis and orders addressed:  1. Thoracic aortic atherosclerosis (HCC) - CMP14+EGFR - CBC with Differential/Platelet - Lipid panel  2. Essential hypertension - CMP14+EGFR - CBC with Differential/Platelet  3. Atrial fibrillation with RVR (HCC) - CMP14+EGFR - CBC with Differential/Platelet  4. Other emphysema ([place]) - CMP14+EGFR - CBC with Differential/Platelet  5. Trigeminal neuralgia - CMP14+EGFR - CBC with Differential/Platelet  6. Tobacco use - CMP14+EGFR - CBC with Differential/Platelet  7. Obesity (BMI 30-39.9)  - CMP14+EGFR - CBC with Differential/Platelet  8. Late effect of cerebrovascular accident (CVA) - CMP14+EGFR - CBC with Differential/Platelet  9. Hyperlipidemia, unspecified hyperlipidemia type - CMP14+EGFR - CBC with Differential/Platelet - Lipid panel  10. Adjustment disorder with mixed anxiety and depressed mood - CMP14+EGFR - CBC with Differential/Platelet   11. Hypotension, unspecified hypotension type - CMP14+EGFR - CBC with Differential/Platelet   Will hold his baclofen, Coreg, and Lisinopril.  Recheck in 2-3 days in office If weakness worsens need to go to ED. Fall preventions discussed. Force fluids!!! Call Cardiologists and schedule follow up appt.   Pt can not read, but spent approx 5 mins going over medications and witting names of medications. His room mate can read and will pull these medications. Labs pending   Evelina Dun, FNP

## 2018-08-28 ENCOUNTER — Other Ambulatory Visit: Payer: Medicare Other

## 2018-08-28 ENCOUNTER — Telehealth: Payer: Self-pay | Admitting: Family Medicine

## 2018-08-28 ENCOUNTER — Encounter (HOSPITAL_COMMUNITY): Payer: Self-pay | Admitting: *Deleted

## 2018-08-28 ENCOUNTER — Other Ambulatory Visit: Payer: Self-pay

## 2018-08-28 ENCOUNTER — Emergency Department (HOSPITAL_COMMUNITY): Payer: Medicare Other

## 2018-08-28 ENCOUNTER — Inpatient Hospital Stay (HOSPITAL_COMMUNITY)
Admission: EM | Admit: 2018-08-28 | Discharge: 2018-08-30 | DRG: 394 | Disposition: A | Discharge status: Home / Self Care | Payer: Medicare Other | Source: Ambulatory Visit | Attending: Internal Medicine | Admitting: Internal Medicine

## 2018-08-28 DIAGNOSIS — Z7982 Long term (current) use of aspirin: Secondary | ICD-10-CM | POA: Diagnosis not present

## 2018-08-28 DIAGNOSIS — Z8673 Personal history of transient ischemic attack (TIA), and cerebral infarction without residual deficits: Secondary | ICD-10-CM

## 2018-08-28 DIAGNOSIS — Z79899 Other long term (current) drug therapy: Secondary | ICD-10-CM

## 2018-08-28 DIAGNOSIS — Z880 Allergy status to penicillin: Secondary | ICD-10-CM

## 2018-08-28 DIAGNOSIS — E1165 Type 2 diabetes mellitus with hyperglycemia: Secondary | ICD-10-CM | POA: Diagnosis present

## 2018-08-28 DIAGNOSIS — E785 Hyperlipidemia, unspecified: Secondary | ICD-10-CM | POA: Diagnosis present

## 2018-08-28 DIAGNOSIS — E871 Hypo-osmolality and hyponatremia: Secondary | ICD-10-CM | POA: Diagnosis present

## 2018-08-28 DIAGNOSIS — E669 Obesity, unspecified: Secondary | ICD-10-CM

## 2018-08-28 DIAGNOSIS — R531 Weakness: Secondary | ICD-10-CM

## 2018-08-28 DIAGNOSIS — Z7951 Long term (current) use of inhaled steroids: Secondary | ICD-10-CM | POA: Diagnosis not present

## 2018-08-28 DIAGNOSIS — I10 Essential (primary) hypertension: Secondary | ICD-10-CM | POA: Diagnosis present

## 2018-08-28 DIAGNOSIS — J438 Other emphysema: Secondary | ICD-10-CM

## 2018-08-28 DIAGNOSIS — Z833 Family history of diabetes mellitus: Secondary | ICD-10-CM

## 2018-08-28 DIAGNOSIS — E663 Overweight: Secondary | ICD-10-CM | POA: Diagnosis present

## 2018-08-28 DIAGNOSIS — K611 Rectal abscess: Secondary | ICD-10-CM

## 2018-08-28 DIAGNOSIS — K612 Anorectal abscess: Principal | ICD-10-CM | POA: Diagnosis present

## 2018-08-28 DIAGNOSIS — F17210 Nicotine dependence, cigarettes, uncomplicated: Secondary | ICD-10-CM | POA: Diagnosis present

## 2018-08-28 DIAGNOSIS — Z8249 Family history of ischemic heart disease and other diseases of the circulatory system: Secondary | ICD-10-CM

## 2018-08-28 DIAGNOSIS — R739 Hyperglycemia, unspecified: Secondary | ICD-10-CM | POA: Diagnosis not present

## 2018-08-28 DIAGNOSIS — Z209 Contact with and (suspected) exposure to unspecified communicable disease: Secondary | ICD-10-CM | POA: Diagnosis not present

## 2018-08-28 DIAGNOSIS — I480 Paroxysmal atrial fibrillation: Secondary | ICD-10-CM

## 2018-08-28 DIAGNOSIS — Z03818 Encounter for observation for suspected exposure to other biological agents ruled out: Secondary | ICD-10-CM | POA: Diagnosis not present

## 2018-08-28 DIAGNOSIS — Z1159 Encounter for screening for other viral diseases: Secondary | ICD-10-CM

## 2018-08-28 DIAGNOSIS — J449 Chronic obstructive pulmonary disease, unspecified: Secondary | ICD-10-CM | POA: Diagnosis present

## 2018-08-28 DIAGNOSIS — F329 Major depressive disorder, single episode, unspecified: Secondary | ICD-10-CM | POA: Diagnosis present

## 2018-08-28 DIAGNOSIS — E119 Type 2 diabetes mellitus without complications: Secondary | ICD-10-CM

## 2018-08-28 DIAGNOSIS — K610 Anal abscess: Secondary | ICD-10-CM | POA: Diagnosis not present

## 2018-08-28 DIAGNOSIS — G500 Trigeminal neuralgia: Secondary | ICD-10-CM | POA: Diagnosis present

## 2018-08-28 DIAGNOSIS — Z6832 Body mass index (BMI) 32.0-32.9, adult: Secondary | ICD-10-CM

## 2018-08-28 DIAGNOSIS — Z825 Family history of asthma and other chronic lower respiratory diseases: Secondary | ICD-10-CM | POA: Diagnosis not present

## 2018-08-28 DIAGNOSIS — I454 Nonspecific intraventricular block: Secondary | ICD-10-CM | POA: Diagnosis not present

## 2018-08-28 DIAGNOSIS — Z7901 Long term (current) use of anticoagulants: Secondary | ICD-10-CM

## 2018-08-28 DIAGNOSIS — I4510 Unspecified right bundle-branch block: Secondary | ICD-10-CM | POA: Diagnosis not present

## 2018-08-28 LAB — CBC WITH DIFFERENTIAL/PLATELET
Abs Immature Granulocytes: 0.06 10*3/uL (ref 0.00–0.07)
Basophils Absolute: 0 10*3/uL (ref 0.0–0.2)
Basophils Absolute: 0 10*3/uL (ref 0.0–0.1)
Basophils Relative: 0 %
Basos: 0 %
EOS (ABSOLUTE): 0.1 10*3/uL (ref 0.0–0.4)
Eos: 1 %
Eosinophils Absolute: 0.1 10*3/uL (ref 0.0–0.5)
Eosinophils Relative: 1 %
HCT: 39.1 % (ref 39.0–52.0)
Hematocrit: 44 % (ref 37.5–51.0)
Hemoglobin: 14.3 g/dL (ref 13.0–17.7)
Hemoglobin: 13.5 g/dL (ref 13.0–17.0)
Immature Grans (Abs): 0.1 10*3/uL (ref 0.0–0.1)
Immature Granulocytes: 1 %
Immature Granulocytes: 1 %
Lymphocytes Absolute: 1.8 10*3/uL (ref 0.7–3.1)
Lymphocytes Relative: 11 %
Lymphs Abs: 1.1 10*3/uL (ref 0.7–4.0)
Lymphs: 14 %
MCH: 30.2 pg (ref 26.6–33.0)
MCH: 30.5 pg (ref 26.0–34.0)
MCHC: 32.5 g/dL (ref 31.5–35.7)
MCHC: 34.5 g/dL (ref 30.0–36.0)
MCV: 93 fL (ref 79–97)
MCV: 88.3 fL (ref 80.0–100.0)
Monocytes Absolute: 0.9 10*3/uL (ref 0.1–0.9)
Monocytes Absolute: 0.8 10*3/uL (ref 0.1–1.0)
Monocytes Relative: 8 %
Monocytes: 7 %
Neutro Abs: 8.2 10*3/uL — ABNORMAL HIGH (ref 1.7–7.7)
Neutrophils Absolute: 9.5 10*3/uL — ABNORMAL HIGH (ref 1.4–7.0)
Neutrophils Relative %: 79 %
Neutrophils: 77 %
Platelets: 228 10*3/uL (ref 150–450)
Platelets: 189 10*3/uL (ref 150–400)
RBC: 4.74 x10E6/uL (ref 4.14–5.80)
RBC: 4.43 MIL/uL (ref 4.22–5.81)
RDW: 12.1 % (ref 11.6–15.4)
RDW: 12.3 % (ref 11.5–15.5)
WBC: 12.3 10*3/uL — ABNORMAL HIGH (ref 3.4–10.8)
WBC: 10.3 10*3/uL (ref 4.0–10.5)
nRBC: 0 % (ref 0.0–0.2)

## 2018-08-28 LAB — URINALYSIS, ROUTINE W REFLEX MICROSCOPIC
Bacteria, UA: NONE SEEN
Bilirubin Urine: NEGATIVE
Glucose, UA: >=500 mg/dL — AB
Hgb urine dipstick: NEGATIVE
Ketones, ur: 20 mg/dL — AB
Leukocytes,Ua: NEGATIVE
Nitrite: NEGATIVE
Protein, ur: NEGATIVE mg/dL
Specific Gravity, Urine: 1.03 (ref 1.005–1.030)
pH: 6 (ref 5.0–8.0)

## 2018-08-28 LAB — CMP14+EGFR
ALT: 32 IU/L (ref 0–44)
AST: 17 IU/L (ref 0–40)
Albumin/Globulin Ratio: 1.5 (ref 1.2–2.2)
Albumin: 4.1 g/dL (ref 3.8–4.9)
Alkaline Phosphatase: 179 IU/L — ABNORMAL HIGH (ref 39–117)
BUN/Creatinine Ratio: 14 (ref 9–20)
BUN: 16 mg/dL (ref 6–24)
Bilirubin Total: 1 mg/dL (ref 0.0–1.2)
CO2: 21 mmol/L (ref 20–29)
Calcium: 9 mg/dL (ref 8.7–10.2)
Chloride: 78 mmol/L — ABNORMAL LOW (ref 96–106)
Creatinine, Ser: 1.15 mg/dL (ref 0.76–1.27)
GFR calc Af Amer: 80 mL/min/{1.73_m2} (ref >59)
GFR calc non Af Amer: 69 mL/min/{1.73_m2} (ref >59)
Globulin, Total: 2.8 g/dL (ref 1.5–4.5)
Glucose: 858 mg/dL (ref 65–99)
Potassium: 5 mmol/L (ref 3.5–5.2)
Sodium: 120 mmol/L — ABNORMAL LOW (ref 134–144)
Total Protein: 6.9 g/dL (ref 6.0–8.5)

## 2018-08-28 LAB — CBG MONITORING, ED
Glucose-Capillary: 461 mg/dL — ABNORMAL HIGH (ref 70–99)
Glucose-Capillary: 331 mg/dL — ABNORMAL HIGH (ref 70–99)

## 2018-08-28 LAB — COMPREHENSIVE METABOLIC PANEL
ALT: 24 U/L (ref 0–44)
AST: 14 U/L — ABNORMAL LOW (ref 15–41)
Albumin: 3.2 g/dL — ABNORMAL LOW (ref 3.5–5.0)
Alkaline Phosphatase: 101 U/L (ref 38–126)
Anion gap: 12 (ref 5–15)
BUN: 17 mg/dL (ref 6–20)
CO2: 24 mmol/L (ref 22–32)
Calcium: 8.1 mg/dL — ABNORMAL LOW (ref 8.9–10.3)
Chloride: 94 mmol/L — ABNORMAL LOW (ref 98–111)
Creatinine, Ser: 0.79 mg/dL (ref 0.61–1.24)
GFR calc Af Amer: >60 mL/min (ref >60)
GFR calc non Af Amer: >60 mL/min (ref >60)
Glucose, Bld: 464 mg/dL — ABNORMAL HIGH (ref 70–99)
Potassium: 3.9 mmol/L (ref 3.5–5.1)
Sodium: 130 mmol/L — ABNORMAL LOW (ref 135–145)
Total Bilirubin: 1.2 mg/dL (ref 0.3–1.2)
Total Protein: 6.3 g/dL — ABNORMAL LOW (ref 6.5–8.1)

## 2018-08-28 LAB — SARS CORONAVIRUS 2 BY RT PCR (HOSPITAL ORDER, PERFORMED IN ~~LOC~~ HOSPITAL LAB): SARS Coronavirus 2: NEGATIVE

## 2018-08-28 LAB — LIPID PANEL
Chol/HDL Ratio: 2.9 ratio (ref 0.0–5.0)
Cholesterol, Total: 120 mg/dL (ref 100–199)
HDL: 42 mg/dL (ref >39)
LDL Calculated: 37 mg/dL (ref 0–99)
Triglycerides: 204 mg/dL — ABNORMAL HIGH (ref 0–149)
VLDL Cholesterol Cal: 41 mg/dL — ABNORMAL HIGH (ref 5–40)

## 2018-08-28 LAB — GLUCOSE HEMOCUE WAIVED: Glu Hemocue Waived: >444 mg/dL (ref 65–99)

## 2018-08-28 LAB — GLUCOSE, CAPILLARY
Glucose-Capillary: 337 mg/dL — ABNORMAL HIGH (ref 70–99)
Glucose-Capillary: 402 mg/dL — ABNORMAL HIGH (ref 70–99)

## 2018-08-28 LAB — TSH: TSH: 0.292 u[IU]/mL — ABNORMAL LOW (ref 0.350–4.500)

## 2018-08-28 MED ORDER — ONDANSETRON HCL 4 MG/2ML IJ SOLN: 4.0000 mg | Freq: Four times a day (QID) | INTRAMUSCULAR | Status: DC | PRN

## 2018-08-28 MED ORDER — POTASSIUM CHLORIDE IN NACL 20-0.9 MEQ/L-% IV SOLN
INTRAVENOUS | Status: AC
  Administered 2018-08-28 – 2018-08-29 (×2): via INTRAVENOUS

## 2018-08-28 MED ORDER — ATORVASTATIN CALCIUM 40 MG PO TABS
40.0000 mg | ORAL_TABLET | Freq: Every day | ORAL | Status: DC
  Administered 2018-08-28 – 2018-08-30 (×3): 40 mg via ORAL
  Filled 2018-08-28 (×3): qty 1

## 2018-08-28 MED ORDER — SODIUM CHLORIDE 0.9 % IV SOLN
1.0000 g | Freq: Three times a day (TID) | INTRAVENOUS | Status: DC
  Administered 2018-08-29 – 2018-08-30 (×5): 1 g via INTRAVENOUS
  Filled 2018-08-28 (×5): qty 1

## 2018-08-28 MED ORDER — CARVEDILOL 3.125 MG PO TABS
3.1250 mg | ORAL_TABLET | Freq: Two times a day (BID) | ORAL | Status: DC
  Administered 2018-08-28 – 2018-08-30 (×5): 3.125 mg via ORAL
  Filled 2018-08-28 (×6): qty 1

## 2018-08-28 MED ORDER — LISINOPRIL 5 MG PO TABS
5.0000 mg | ORAL_TABLET | Freq: Every day | ORAL | Status: DC
  Filled 2018-08-28: qty 1

## 2018-08-28 MED ORDER — ALBUTEROL SULFATE (2.5 MG/3ML) 0.083% IN NEBU: 2.5000 mg | INHALATION_SOLUTION | Freq: Four times a day (QID) | RESPIRATORY_TRACT | Status: DC | PRN

## 2018-08-28 MED ORDER — IOHEXOL 300 MG/ML  SOLN
100.0000 mL | Freq: Once | INTRAMUSCULAR | Status: AC | PRN
  Administered 2018-08-28: 100 mL via INTRAVENOUS

## 2018-08-28 MED ORDER — HYDROCODONE-ACETAMINOPHEN 5-325 MG PO TABS: 1.0000 | ORAL_TABLET | ORAL | Status: DC | PRN

## 2018-08-28 MED ORDER — SODIUM CHLORIDE 0.9% FLUSH
3.0000 mL | Freq: Two times a day (BID) | INTRAVENOUS | Status: DC
  Administered 2018-08-29 – 2018-08-30 (×2): 3 mL via INTRAVENOUS

## 2018-08-28 MED ORDER — SODIUM CHLORIDE 0.9 % IV BOLUS
1000.0000 mL | Freq: Once | INTRAVENOUS | Status: AC
  Administered 2018-08-28: 16:00:00 1000 mL via INTRAVENOUS

## 2018-08-28 MED ORDER — VANCOMYCIN HCL IN DEXTROSE 1-5 GM/200ML-% IV SOLN
1000.0000 mg | Freq: Three times a day (TID) | INTRAVENOUS | Status: DC
  Administered 2018-08-29 – 2018-08-30 (×4): 1000 mg via INTRAVENOUS
  Filled 2018-08-28 (×4): qty 200

## 2018-08-28 MED ORDER — INSULIN GLARGINE 100 UNIT/ML ~~LOC~~ SOLN
15.0000 [IU] | Freq: Every day | SUBCUTANEOUS | Status: DC
  Administered 2018-08-28 – 2018-08-29 (×2): 15 [IU] via SUBCUTANEOUS
  Filled 2018-08-28 (×5): qty 0.15

## 2018-08-28 MED ORDER — GABAPENTIN 300 MG PO CAPS
900.0000 mg | ORAL_CAPSULE | Freq: Every day | ORAL | Status: DC
  Administered 2018-08-28 – 2018-08-29 (×2): 900 mg via ORAL
  Filled 2018-08-28 (×2): qty 3

## 2018-08-28 MED ORDER — ACETAMINOPHEN 650 MG RE SUPP: 650.0000 mg | Freq: Four times a day (QID) | RECTAL | Status: DC | PRN

## 2018-08-28 MED ORDER — INSULIN ASPART 100 UNIT/ML ~~LOC~~ SOLN
6.0000 [IU] | Freq: Once | SUBCUTANEOUS | Status: AC
  Administered 2018-08-28: 6 [IU] via SUBCUTANEOUS
  Filled 2018-08-28: qty 1

## 2018-08-28 MED ORDER — BACLOFEN 10 MG PO TABS
10.0000 mg | ORAL_TABLET | Freq: Three times a day (TID) | ORAL | Status: DC
  Administered 2018-08-28 – 2018-08-30 (×6): 10 mg via ORAL
  Filled 2018-08-28 (×7): qty 1

## 2018-08-28 MED ORDER — ACETAMINOPHEN 325 MG PO TABS: 650.0000 mg | ORAL_TABLET | Freq: Four times a day (QID) | ORAL | Status: DC | PRN

## 2018-08-28 MED ORDER — LORATADINE 10 MG PO TABS
10.0000 mg | ORAL_TABLET | Freq: Every day | ORAL | Status: DC
  Administered 2018-08-29 – 2018-08-30 (×2): 10 mg via ORAL
  Filled 2018-08-28 (×3): qty 1

## 2018-08-28 MED ORDER — GUAIFENESIN ER 600 MG PO TB12
600.0000 mg | ORAL_TABLET | Freq: Two times a day (BID) | ORAL | Status: DC
  Administered 2018-08-28 – 2018-08-30 (×4): 600 mg via ORAL
  Filled 2018-08-28 (×5): qty 1

## 2018-08-28 MED ORDER — LEVETIRACETAM 500 MG PO TABS
500.0000 mg | ORAL_TABLET | Freq: Two times a day (BID) | ORAL | Status: DC
  Administered 2018-08-28 – 2018-08-30 (×4): 500 mg via ORAL
  Filled 2018-08-28 (×5): qty 1

## 2018-08-28 MED ORDER — DULOXETINE HCL 30 MG PO CPEP
30.0000 mg | ORAL_CAPSULE | Freq: Every day | ORAL | Status: DC
  Administered 2018-08-29 – 2018-08-30 (×2): 30 mg via ORAL
  Filled 2018-08-28 (×3): qty 1

## 2018-08-28 MED ORDER — FLUTICASONE PROPIONATE 50 MCG/ACT NA SUSP
2.0000 | Freq: Every day | NASAL | Status: DC
  Administered 2018-08-29 – 2018-08-30 (×2): 2 via NASAL
  Filled 2018-08-28: qty 16

## 2018-08-28 MED ORDER — VANCOMYCIN HCL IN DEXTROSE 1-5 GM/200ML-% IV SOLN
1000.0000 mg | Freq: Once | INTRAVENOUS | Status: AC
  Administered 2018-08-28: 18:00:00 1000 mg via INTRAVENOUS
  Filled 2018-08-28: qty 200

## 2018-08-28 MED ORDER — INSULIN ASPART 100 UNIT/ML ~~LOC~~ SOLN
0.0000 [IU] | Freq: Every day | SUBCUTANEOUS | Status: DC
  Administered 2018-08-28: 23:00:00 5 [IU] via SUBCUTANEOUS
  Administered 2018-08-29: 22:00:00 3 [IU] via SUBCUTANEOUS

## 2018-08-28 MED ORDER — GABAPENTIN 600 MG PO TABS: 600.0000 mg | ORAL_TABLET | ORAL | Status: DC

## 2018-08-28 MED ORDER — ASPIRIN EC 81 MG PO TBEC
81.0000 mg | DELAYED_RELEASE_TABLET | Freq: Every day | ORAL | Status: DC
  Administered 2018-08-28 – 2018-08-30 (×3): 81 mg via ORAL
  Filled 2018-08-28 (×4): qty 1

## 2018-08-28 MED ORDER — ONDANSETRON HCL 4 MG PO TABS: 4.0000 mg | ORAL_TABLET | Freq: Four times a day (QID) | ORAL | Status: DC | PRN

## 2018-08-28 MED ORDER — AMLODIPINE BESYLATE 5 MG PO TABS
10.0000 mg | ORAL_TABLET | Freq: Every day | ORAL | Status: DC
Start: 2018-08-29 — End: 2018-08-29
  Filled 2018-08-28: qty 2

## 2018-08-28 MED ORDER — INSULIN ASPART 100 UNIT/ML ~~LOC~~ SOLN
0.0000 [IU] | Freq: Three times a day (TID) | SUBCUTANEOUS | Status: DC
  Administered 2018-08-29: 12:00:00 11 [IU] via SUBCUTANEOUS
  Administered 2018-08-29 – 2018-08-30 (×4): 8 [IU] via SUBCUTANEOUS
  Administered 2018-08-30: 11 [IU] via SUBCUTANEOUS

## 2018-08-28 MED ORDER — GABAPENTIN 300 MG PO CAPS
600.0000 mg | ORAL_CAPSULE | Freq: Two times a day (BID) | ORAL | Status: DC
  Administered 2018-08-29 – 2018-08-30 (×4): 600 mg via ORAL
  Filled 2018-08-28 (×5): qty 2

## 2018-08-28 MED ORDER — VANCOMYCIN HCL IN DEXTROSE 1-5 GM/200ML-% IV SOLN
1000.0000 mg | Freq: Once | INTRAVENOUS | Status: AC
  Administered 2018-08-28: 23:00:00 1000 mg via INTRAVENOUS
  Filled 2018-08-28: qty 200

## 2018-08-28 NOTE — Progress Notes (Unsigned)
bloo 

## 2018-08-28 NOTE — Telephone Encounter (Signed)
Attempted to contact patient again - left another VM telling patient to give me a call back asap

## 2018-08-28 NOTE — H&P (Signed)
History and Physical    Matthew [name] CHE:[id number] DOB: 1960/07/01 DOA: 08/28/2018  PCP: [name], FNP  Patient coming from: Home  I have personally briefly reviewed patient's old medical records in [place]  Chief Complaint: Weakness/buttock bump  HPI: Matthew [name] is a 58 y.o. male with medical history significant of paroxysmal atrial fibrillation on Eliquis, COPD, hypertension, hyperlipidemia, obesity and several other comorbidities was sent to the emergency department by his PCP.  Reportedly, patient went to see his PCP 2 days ago for a regular visit.  He ended up having basic lab work and went today the results came back to his PCP, his blood sugar was 858 and sodium was 120.  He was called by PCP office to come to the emergency department.  According to his PCPs note 2 days ago, patient did not have any complaint however today patient tells me that he has been having weakness along with polyuria and polydipsia as well as some blurry vision for past 3 weeks.  Once arrived to the emergency department, he also told ED physician that he has noticed a left buttock bump also about 3 weeks.  He denied any fever, chills, sweating, chest pain, shortness of breath, nausea, vomiting, any problem with urination or with bowel movements.  ED Course: Upon arrival to the emergency department, he was hemodynamically stable.  CBC was normal.  Initial blood sugar was > 444.  Anion gap of only 12.  Sodium 130 (corrected sodium within normal range).  He was given some IV fluids and regular insulin.  Repeat blood sugar was 331.  He told ED physician about the left buttock bump.  He ended up having CT abdomen and pelvis and was diagnosed with left perirectal abscess.  ED physician discussed the case with on-call general surgeon who had recommended admission to hospital service and probably incision and drainage tomorrow morning.  I had requested ED physician to obtain blood cultures and provide the  patient with antibiotics.  Review of Systems: As per HPI otherwise 10 point review of systems negative.    Past Medical History:  Diagnosis Date   Abscess    Anxiety    Aphthous ulcer    Arthritis    Asthma    Chronic bronchitis    Chronic pain    COPD (chronic obstructive pulmonary disease) (HCC)    CVA (cerebral vascular accident) ([place])    Depression    Hyperlipidemia    Hypertension    Trigeminal neuralgia    Vertigo     Past Surgical History:  Procedure Laterality Date   CHOLECYSTECTOMY     HAND RECONSTRUCTION Right 1990's     reports that he has been smoking cigarettes. He started smoking about 43 years ago. He has a 80.00 pack-year smoking history. He has never used smokeless tobacco. He reports previous alcohol use. He reports that he does not use drugs.  Allergies  Allergen Reactions   Penicillins Other (See Comments)    Did it involve swelling of the face/tongue/throat, SOB, or low BP? Unknown Did it involve sudden or severe rash/hives, skin peeling, or any reaction on the inside of your mouth or nose? Unknown Did you need to seek medical attention at a hospital or doctor's office? Unknown When did it last happen?childhood If all above answers are NO, may proceed with cephalosporin use.     Family History  Problem Relation Age of Onset   Hypertension Mother    Diabetes Mother  Hypertension Father    Diabetes Father    Cancer Father    Emphysema Father     Prior to Admission medications   Medication Sig Start Date End Date Taking? Authorizing Provider  albuterol (VENTOLIN HFA) 108 (90 Base) MCG/ACT inhaler INHALE TWO PUFFS INTO LUNGS EVERY SIX HOURS AS NEEDED FOR WHEEZE OR SHORTNESS OF BREATH. Patient taking differently: Inhale 2 puffs into the lungs every 6 (six) hours as needed for wheezing or shortness of breath.  08/09/18  Yes Hawks, Christy A, FNP  amLODipine (NORVASC) 10 MG tablet Take 10 mg by mouth daily. 07/24/18  Yes  [provider]  apixaban (ELIQUIS) 5 MG TABS tablet Take 1 tablet (5 mg total) by mouth 2 (two) times daily. 08/22/18  Yes Hawks, Christy A, FNP  aspirin EC 81 MG tablet Take 81 mg by mouth daily.   Yes [provider]  atorvastatin (LIPITOR) 40 MG tablet Take 1 tablet (40 mg total) by mouth daily at 6 PM. 04/19/18  Yes Hawks, Christy A, FNP  B Complex-C (B-COMPLEX WITH VITAMIN C) tablet Take 1 tablet by mouth daily. 04/19/18  Yes Hawks, Christy A, FNP  baclofen (LIORESAL) 10 MG tablet Take 1 tablet (10 mg total) by mouth 3 (three) times daily. 06/25/18  Yes Hawks, Christy A, FNP  carvedilol (COREG) 3.125 MG tablet TAKE 1 TABLET (3.125 MG TOTAL) BY MOUTH 2 (TWO) TIMES DAILY WITH A MEAL. 04/25/18  Yes Hawks, Christy A, FNP  DULoxetine (CYMBALTA) 30 MG capsule Take 1 capsule (30 mg total) by mouth daily. 04/19/18  Yes Hawks, Christy A, FNP  fluticasone (FLONASE) 50 MCG/ACT nasal spray Place 2 sprays into both nostrils daily.  05/27/18  Yes [provider]  Fluticasone-Umeclidin-Vilant (TRELEGY ELLIPTA) 100-62.5-25 MCG/INH AEPB Inhale 1 puff into the lungs daily. 04/19/18  Yes Hawks, Christy A, FNP  gabapentin (NEURONTIN) 600 MG tablet TAKE 1 TABLET IN THE MORNING, 1 TABLET IN THE AFTERNOON, AND 1.5 TABLET AT BEDTIME AS DIRECTED Patient taking differently: Take 600-900 mg by mouth See admin instructions. Take 600mg  in the morning, 600mg  in the afternoon, and take 900mg  at bedtime 06/07/18  Yes Hawks, Christy A, FNP  guaiFENesin (MUCINEX) 600 MG 12 hr tablet Take 1 tablet (600 mg total) by mouth 2 (two) times daily. 06/25/18  Yes Hawks, Christy A, FNP  levETIRAcetam (KEPPRA) 500 MG tablet Take 1 tablet (500 mg total) by mouth 2 (two) times daily. 04/19/18  Yes Hawks, Christy A, FNP  lisinopril (PRINIVIL,ZESTRIL) 5 MG tablet Take 1 tablet (5 mg total) by mouth daily for 30 days. 04/19/18 08/28/18 Yes Hawks, Christy A, FNP  loratadine (CLARITIN) 10 MG tablet Take 1 tablet (10 mg total) by mouth  daily. 05/31/18  Yes Ronnie Doss M, DO  sulfamethoxazole-trimethoprim (BACTRIM DS) 800-160 MG tablet Take 1 tablet by mouth 2 (two) times daily. Patient taking differently: Take 1 tablet by mouth 2 (two) times daily. 7 day course starting on 08/26/2018 08/26/18  Yes [name], FNP    Physical Exam: Vitals:   08/28/18 1515 08/28/18 1521 08/28/18 1530  BP: 112/70  111/68  Pulse: 83  86  Resp: 16    Temp: 98.7 F (37.1 C)    TempSrc: Oral    SpO2: 97%  97%  Weight:  99.8 kg   Height:  5\' 9"  (1.753 m)     Constitutional: NAD, calm, comfortable Vitals:   08/28/18 1515 08/28/18 1521 08/28/18 1530  BP: 112/70  111/68  Pulse: 83  86  Resp: 16    Temp: 98.7 F (37.1 C)    TempSrc: Oral    SpO2: 97%  97%  Weight:  99.8 kg   Height:  5\' 9"  (1.753 m)    Eyes: PERRL, lids and conjunctivae normal ENMT: Mucous membranes are moist. Posterior pharynx clear of any exudate or lesions.Normal dentition.  Neck: normal, supple, no masses, no thyromegaly Respiratory: clear to auscultation bilaterally, no wheezing, no crackles. Normal respiratory effort. No accessory muscle use.  Cardiovascular: Regular rate and rhythm, no murmurs / rubs / gallops. No extremity edema. 2+ pedal pulses. No carotid bruits.  Abdomen: no tenderness, no masses palpated. No hepatosplenomegaly. Bowel sounds positive.  Musculoskeletal: no clubbing / cyanosis. No joint deformity upper and lower extremities. Good ROM, no contractures. Normal muscle tone.   Skin: no rashes, lesions, ulcers.  He has approximately 5 x 5 cm fluctuant mass at the medial side of the left buttock.  This is nontender and not warm and has no erythema. Neurologic: CN 2-12 grossly intact. Sensation intact, DTR normal. Strength 5/5 in all 4.  Psychiatric: Normal judgment and insight. Alert and oriented x 3. Normal mood.    Labs on Admission: I have personally reviewed following labs and imaging studies  CBC: Recent Labs  Lab 08/26/18 1205  08/28/18 1553  WBC 12.3* 10.3  NEUTROABS 9.5* 8.2*  HGB 14.3 13.5  HCT 44.0 39.1  MCV 93 88.3  PLT 228 423   Basic Metabolic Panel: Recent Labs  Lab 08/26/18 1205 08/28/18 1553  NA 120* 130*  K 5.0 3.9  CL 78* 94*  CO2 21 24  GLUCOSE 858* 464*  BUN 16 17  CREATININE 1.15 0.79  CALCIUM 9.0 8.1*   GFR: Estimated Creatinine Clearance: 115.7 mL/min (by C-G formula based on SCr of 0.79 mg/dL). Liver Function Tests: Recent Labs  Lab 08/26/18 1205 08/28/18 1553  AST 17 14*  ALT 32 24  ALKPHOS 179* 101  BILITOT 1.0 1.2  PROT 6.9 6.3*  ALBUMIN 4.1 3.2*   No results for input(s): LIPASE, AMYLASE in the last 168 hours. No results for input(s): AMMONIA in the last 168 hours. Coagulation Profile: No results for input(s): INR, PROTIME in the last 168 hours. Cardiac Enzymes: No results for input(s): CKTOTAL, CKMB, CKMBINDEX, TROPONINI in the last 168 hours. BNP (last 3 results) No results for input(s): PROBNP in the last 8760 hours. HbA1C: No results for input(s): HGBA1C in the last 72 hours. CBG: Recent Labs  Lab 08/28/18 1521 08/28/18 1731  GLUCAP 461* 331*   Lipid Profile: Recent Labs    08/26/18 1205  CHOL 120  HDL 42  LDLCALC 37  TRIG 204*  CHOLHDL 2.9   Thyroid Function Tests: No results for input(s): TSH, T4TOTAL, FREET4, T3FREE, THYROIDAB in the last 72 hours. Anemia Panel: No results for input(s): VITAMINB12, FOLATE, FERRITIN, TIBC, IRON, RETICCTPCT in the last 72 hours. Urine analysis:    Component Value Date/Time   COLORURINE YELLOW 08/28/2018 1535   APPEARANCEUR CLEAR 08/28/2018 1535   APPEARANCEUR Clear 12/02/2015 1621   LABSPEC 1.030 08/28/2018 1535   PHURINE 6.0 08/28/2018 1535   GLUCOSEU >=500 (A) 08/28/2018 1535   HGBUR NEGATIVE 08/28/2018 1535   BILIRUBINUR NEGATIVE 08/28/2018 1535   BILIRUBINUR Negative 12/02/2015 1621   KETONESUR 20 (A) 08/28/2018 1535   PROTEINUR NEGATIVE 08/28/2018 1535   UROBILINOGEN 0.2 08/21/2012 1549    NITRITE NEGATIVE 08/28/2018 1535   LEUKOCYTESUR NEGATIVE 08/28/2018 1535    Radiological Exams on Admission: Ct Pelvis  W Contrast  Result Date: 08/28/2018 CLINICAL DATA:  Rectal pain for the past 1.5 weeks. Clinically diagnosed abscess in the anal/perirectal region. EXAM: CT PELVIS WITH CONTRAST TECHNIQUE: Multidetector CT imaging of the pelvis was performed using the standard protocol following the bolus administration of intravenous contrast. CONTRAST:  144mL OMNIPAQUE IOHEXOL 300 MG/ML  SOLN COMPARISON:  11/01/2017. FINDINGS: Urinary Tract:  No abnormality visualized. Bowel: Mild sigmoid colon diverticulosis. Mildly prominent stool in the colon, including the rectum. Unremarkable small bowel and appendix. Vascular/Lymphatic: Atheromatous arterial calcifications without aneurysm. No enlarged lymph nodes. Reproductive:  Unremarkable prostate gland. Other: Left perianal fluid collection with a moderately thickened, mildly enhancing surrounding rim of soft tissue. This measures 5.1 x 3.6 cm on image number 55 series 2 and 5.1 cm in length on coronal image number 92. Soft tissue stranding in the adjacent subcutaneous fat. Also noted is a tiny umbilical hernia containing fat. Musculoskeletal: Lumbar and lower thoracic spine degenerative changes. Mild bilateral hip degenerative changes. IMPRESSION: 1. 5.1 x 3.6 x 5.1 cm left perianal abscess. 2. Mild sigmoid diverticulosis. 3. Tiny umbilical hernia containing fat. Electronically Signed   By: Claudie Revering M.D.   On: 08/28/2018 17:04    EKG: None done.  Will order one.  Assessment/Plan Active Problems:   HTN (hypertension)   Weakness   COPD (chronic obstructive pulmonary disease) (HCC)   Obesity (BMI 30-39.9)   Hyperglycemia   Perirectal abscess   PAF (paroxysmal atrial fibrillation) (HCC)   Newly diagnosed diabetes ([place])    Perirectal abscess: Patient is allergic to penicillin so I will start him on cefepime as well as vancomycin.  General surgery  has been consulted through ED physician and they plan to do incision and drainage in the morning and for that reason, I will keep him n.p.o. from midnight.  I have requested ED physician to obtain blood cultures before antibiotics.  Hyperglycemia with possibly newly diagnosed diabetes mellitus: Based on his age, he likely has type 2 diabetes mellitus.  I will check hemoglobin A1c.  Start him on 15 units of Lantus as well as SSI.  Hypertension:.  Blood pressure controlled.  Resume home medications which include amlodipine, carvedilol and lisinopril.  Paroxysmal atrial fibrillation: Current in sinus rhythm.  Resume home medications however I will hold tonight dose of Eliquis in preparation for planned incision and drainage tomorrow morning.  This can be resumed after the procedure tomorrow.  Hyperlipidemia: Resume atorvastatin.  DVT prophylaxis: Patient on Eliquis which is going to be held for tonight. Code Status: Full code Family Communication: None present at bedside.  Plan of admission discussed with patient in detail. Disposition Plan: Likely home in next 2 days. Consults called: General surgery (Dr. Arnoldo Morale). Admission status: Inpatient   Darliss Cheney MD [facility] Pager [phone]  If 7PM-7AM, please contact night-coverage [email or web address] Password [facility]  08/28/2018, 6:23 PM

## 2018-08-28 NOTE — Telephone Encounter (Signed)
Patient showed up at office - aware and verbalizes understanding of lab results. Called 911 to come get patient since he did not have a ride to the ER.

## 2018-08-28 NOTE — ED Triage Notes (Signed)
Patient sent from Dr. Gabriel Carina [facility])  today based on sodium level and blood sugar results from yesterday.

## 2018-08-28 NOTE — Telephone Encounter (Signed)
Patient has critical labs of a glucose of 858 and a sodium of 120, this makes him severely hyponatremic, he needs to go to the emergency department and make sure he does not drive himself that he gets a ride with somebody else.  Hyponatremia at this level can sometimes cause People to lose consciousness Caryl Pina, MD [facility] 08/28/2018, 8:38 AM

## 2018-08-28 NOTE — ED Provider Notes (Signed)
[facility] EMERGENCY DEPARTMENT Provider Note   CSN: [id number] Arrival date & time: 08/28/18  1509    History   Chief Complaint Chief Complaint  Patient presents with  . Hyperglycemia    HPI Matthew [name] is a 58 y.o. male with a past medical history including asthma, COPD, CVA, depression, hyperlipidemia, hypertension, atrial fibrillation on Xarelto, and history of hyperglycemia presenting at his doctor's advice for evaluation of hyponatremia and escalating hyperglycemia.  He was seen by his doctor 2 days ago and lab test revealing for a blood glucose of 85 and a sodium of 120.  He was called and advised to present to this ED.  Symptom wise, he endorses increased thirst and increased urinary frequency and generalized fatigue.  He denies headaches, fevers or chills, denies chest pain, nausea, vomiting or abdominal pain.  He does endorse a 1 week history of a suspected abscess at his right rectum and abscess as he endorses pain and swelling without drainage at the site.  He has had no treatment prior to arrival for his symptoms.     HPI  Past Medical History:  Diagnosis Date  . Abscess   . Anxiety   . Aphthous ulcer   . Arthritis   . Asthma   . Chronic bronchitis   . Chronic pain   . COPD (chronic obstructive pulmonary disease) ([place])   . CVA (cerebral vascular accident) ([place])   . Depression   . Hyperlipidemia   . Hypertension   . Trigeminal neuralgia   . Vertigo     Patient Active Problem List   Diagnosis Date Noted  . Perirectal abscess 08/28/2018  . Unable to read or write 05/31/2018  . Canker sores oral   . Bradycardia   . Stroke ([place]) 03/21/2018  . Left hemiparesis ([place])   . Hypomagnesemia   . Hypokalemia 03/20/2018  . Atrial fibrillation with RVR ([place]) 03/20/2018  . Hyponatremia 03/20/2018  . DDD (degenerative disc disease), lumbar 11/13/2017  . Femoroacetabular impingement of both hips 11/13/2017  . Late effect of cerebrovascular accident (CVA) 10/25/2017   . Acute CVA (cerebrovascular accident) ([place]) 10/22/2017  . Tobacco use 10/22/2017  . Hyperglycemia 10/22/2017  . Depression 12/25/2016  . Abnormal drug screen 11/02/2016  . Pain management contract broken 03/09/2016  . Coronary artery calcification 01/18/2016  . Thoracic aortic atherosclerosis ([place]) 01/18/2016  . Chronic back pain 08/03/2015  . Obesity (BMI 30-39.9) 03/09/2015  . Erectile dysfunction 08/24/2014  . Vitamin D deficiency 04/18/2014  . Hyperlipidemia 04/18/2014  . COPD (chronic obstructive pulmonary disease) ([place]) 04/17/2014  . Weakness 08/22/2012  . Chronic pain syndrome 08/19/2012  . Adjustment disorder with mixed anxiety and depressed mood 05/11/2012  . Trigeminal neuralgia 05/11/2012  . HTN (hypertension) 05/11/2012    Past Surgical History:  Procedure Laterality Date  . CHOLECYSTECTOMY    . HAND RECONSTRUCTION Right 1990's        Home Medications    Prior to Admission medications   Medication Sig Start Date End Date Taking? Authorizing Provider  albuterol (VENTOLIN HFA) 108 (90 Base) MCG/ACT inhaler INHALE TWO PUFFS INTO LUNGS EVERY SIX HOURS AS NEEDED FOR WHEEZE OR SHORTNESS OF BREATH. Patient taking differently: Inhale 2 puffs into the lungs every 6 (six) hours as needed for wheezing or shortness of breath.  08/09/18  Yes Hawks, Christy A, FNP  amLODipine (NORVASC) 10 MG tablet Take 10 mg by mouth daily. 07/24/18  Yes [provider]  apixaban (ELIQUIS) 5 MG TABS tablet Take 1  tablet (5 mg total) by mouth 2 (two) times daily. 08/22/18  Yes Hawks, Christy A, FNP  aspirin EC 81 MG tablet Take 81 mg by mouth daily.   Yes [provider]  atorvastatin (LIPITOR) 40 MG tablet Take 1 tablet (40 mg total) by mouth daily at 6 PM. 04/19/18  Yes Hawks, Christy A, FNP  B Complex-C (B-COMPLEX WITH VITAMIN C) tablet Take 1 tablet by mouth daily. 04/19/18  Yes Hawks, Christy A, FNP  baclofen (LIORESAL) 10 MG tablet Take 1 tablet (10 mg total) by mouth 3  (three) times daily. 06/25/18  Yes Hawks, Christy A, FNP  carvedilol (COREG) 3.125 MG tablet TAKE 1 TABLET (3.125 MG TOTAL) BY MOUTH 2 (TWO) TIMES DAILY WITH A MEAL. 04/25/18  Yes Hawks, Christy A, FNP  DULoxetine (CYMBALTA) 30 MG capsule Take 1 capsule (30 mg total) by mouth daily. 04/19/18  Yes Hawks, Christy A, FNP  fluticasone (FLONASE) 50 MCG/ACT nasal spray Place 2 sprays into both nostrils daily.  05/27/18  Yes [provider]  Fluticasone-Umeclidin-Vilant (TRELEGY ELLIPTA) 100-62.5-25 MCG/INH AEPB Inhale 1 puff into the lungs daily. 04/19/18  Yes Hawks, Christy A, FNP  gabapentin (NEURONTIN) 600 MG tablet TAKE 1 TABLET IN THE MORNING, 1 TABLET IN THE AFTERNOON, AND 1.5 TABLET AT BEDTIME AS DIRECTED Patient taking differently: Take 600-900 mg by mouth See admin instructions. Take 600mg  in the morning, 600mg  in the afternoon, and take 900mg  at bedtime 06/07/18  Yes Hawks, Christy A, FNP  guaiFENesin (MUCINEX) 600 MG 12 hr tablet Take 1 tablet (600 mg total) by mouth 2 (two) times daily. 06/25/18  Yes Hawks, Christy A, FNP  levETIRAcetam (KEPPRA) 500 MG tablet Take 1 tablet (500 mg total) by mouth 2 (two) times daily. 04/19/18  Yes Hawks, Christy A, FNP  lisinopril (PRINIVIL,ZESTRIL) 5 MG tablet Take 1 tablet (5 mg total) by mouth daily for 30 days. 04/19/18 08/28/18 Yes Hawks, Christy A, FNP  loratadine (CLARITIN) 10 MG tablet Take 1 tablet (10 mg total) by mouth daily. 05/31/18  Yes Ronnie Doss M, DO  sulfamethoxazole-trimethoprim (BACTRIM DS) 800-160 MG tablet Take 1 tablet by mouth 2 (two) times daily. Patient taking differently: Take 1 tablet by mouth 2 (two) times daily. 7 day course starting on 08/26/2018 08/26/18  Yes Hawks, Theador Hawthorne, FNP    Family History Family History  Problem Relation Age of Onset  . Hypertension Mother   . Diabetes Mother   . Hypertension Father   . Diabetes Father   . Cancer Father   . Emphysema Father     Social History Social History   Tobacco Use  .  Smoking status: Current Every Day Smoker    Packs/day: 2.00    Years: 40.00    Pack years: 80.00    Types: Cigarettes    Start date: 07/23/1975  . Smokeless tobacco: Never Used  Substance Use Topics  . Alcohol use: Not Currently    Comment: 08/21/2012 "quit drinking ~ 1985"  . Drug use: No     Allergies   Penicillins   Review of Systems Review of Systems  Constitutional: Positive for fatigue. Negative for fever.  HENT: Negative for congestion and sore throat.   Eyes: Negative.   Respiratory: Negative for chest tightness and shortness of breath.   Cardiovascular: Negative for chest pain.  Gastrointestinal: Negative for abdominal pain, nausea and vomiting.  Endocrine: Positive for polydipsia and polyuria.  Genitourinary: Negative.   Musculoskeletal: Negative for arthralgias, joint swelling and neck pain.  Skin: Negative.  Negative for rash and wound.       Per HPI  Neurological: Negative for dizziness, weakness, light-headedness, numbness and headaches.  Psychiatric/Behavioral: Negative.      Physical Exam Updated Vital Signs BP 111/68   Pulse 86   Temp 98.7 F (37.1 C) (Oral)   Resp 16   Ht 5\' 9"  (1.753 m)   Wt 99.8 kg   SpO2 97%   BMI 32.49 kg/m   Physical Exam Vitals signs and nursing note reviewed.  Constitutional:      Appearance: He is well-developed.  HENT:     Head: Normocephalic and atraumatic.     Mouth/Throat:     Mouth: Mucous membranes are dry.  Eyes:     Conjunctiva/sclera: Conjunctivae normal.  Neck:     Musculoskeletal: Normal range of motion.  Cardiovascular:     Rate and Rhythm: Normal rate and regular rhythm.     Heart sounds: Normal heart sounds.  Pulmonary:     Effort: Pulmonary effort is normal.     Breath sounds: Normal breath sounds. No wheezing.  Abdominal:     General: Bowel sounds are normal.     Palpations: Abdomen is soft.     Tenderness: There is no abdominal tenderness.  Genitourinary:    Comments: Linear area of raised  induration along right edge of anus, not involving scrotum but involving perineum.  Digital rectal exam revealing for a soft small tender induration along the right rectal wall.  There does not appear to be any drainage from the rectum.  Externally there is no pointing or tenting, no red streaking, no drainage, skin intact. Musculoskeletal: Normal range of motion.  Skin:    General: Skin is warm and dry.  Neurological:     Mental Status: He is alert.      ED Treatments / Results  Labs (all labs ordered are listed, but only abnormal results are displayed) Labs Reviewed  CBC WITH DIFFERENTIAL/PLATELET - Abnormal; Notable for the following components:      Result Value   Neutro Abs 8.2 (*)    All other components within normal limits  COMPREHENSIVE METABOLIC PANEL - Abnormal; Notable for the following components:   Sodium 130 (*)    Chloride 94 (*)    Glucose, Bld 464 (*)    Calcium 8.1 (*)    Total Protein 6.3 (*)    Albumin 3.2 (*)    AST 14 (*)    All other components within normal limits  URINALYSIS, ROUTINE W REFLEX MICROSCOPIC - Abnormal; Notable for the following components:   Glucose, UA >=500 (*)    Ketones, ur 20 (*)    All other components within normal limits  CBG MONITORING, ED - Abnormal; Notable for the following components:   Glucose-Capillary 461 (*)    All other components within normal limits  CBG MONITORING, ED - Abnormal; Notable for the following components:   Glucose-Capillary 331 (*)    All other components within normal limits  SARS CORONAVIRUS 2 (HOSPITAL ORDER, [facility])  CULTURE, BLOOD (ROUTINE X 2)  CULTURE, BLOOD (ROUTINE X 2)    EKG None  Radiology Ct Pelvis W Contrast  Result Date: 08/28/2018 CLINICAL DATA:  Rectal pain for the past 1.5 weeks. Clinically diagnosed abscess in the anal/perirectal region. EXAM: CT PELVIS WITH CONTRAST TECHNIQUE: Multidetector CT imaging of the pelvis was performed using the  standard protocol following the bolus administration of intravenous contrast. CONTRAST:  142mL  OMNIPAQUE IOHEXOL 300 MG/ML  SOLN COMPARISON:  11/01/2017. FINDINGS: Urinary Tract:  No abnormality visualized. Bowel: Mild sigmoid colon diverticulosis. Mildly prominent stool in the colon, including the rectum. Unremarkable small bowel and appendix. Vascular/Lymphatic: Atheromatous arterial calcifications without aneurysm. No enlarged lymph nodes. Reproductive:  Unremarkable prostate gland. Other: Left perianal fluid collection with a moderately thickened, mildly enhancing surrounding rim of soft tissue. This measures 5.1 x 3.6 cm on image number 55 series 2 and 5.1 cm in length on coronal image number 92. Soft tissue stranding in the adjacent subcutaneous fat. Also noted is a tiny umbilical hernia containing fat. Musculoskeletal: Lumbar and lower thoracic spine degenerative changes. Mild bilateral hip degenerative changes. IMPRESSION: 1. 5.1 x 3.6 x 5.1 cm left perianal abscess. 2. Mild sigmoid diverticulosis. 3. Tiny umbilical hernia containing fat. Electronically Signed   By: Claudie Revering M.D.   On: 08/28/2018 17:04    Procedures Procedures (including critical care time)  Medications Ordered in ED Medications  vancomycin (VANCOCIN) IVPB 1000 mg/200 mL premix (has no administration in time range)  sodium chloride 0.9 % bolus 1,000 mL (0 mLs Intravenous Stopped 08/28/18 1653)  insulin aspart (novoLOG) injection 6 Units (6 Units Subcutaneous Given 08/28/18 1544)  iohexol (OMNIPAQUE) 300 MG/ML solution 100 mL (100 mLs Intravenous Contrast Given 08/28/18 1649)     Initial Impression / Assessment and Plan / ED Course  I have reviewed the triage vital signs and the nursing notes.  Pertinent labs & imaging results that were available during my care of the patient were reviewed by me and considered in my medical decision making (see chart for details).        Discussed pt with Dr. Arnoldo Morale.  Will consult in  am, plan OR after cbg's better.  Discussed with Dr. Doristine Bosworth with [place] who accepts admission.   Vancomycin started for tx of infection. Pt is PCN allergic  Final Clinical Impressions(s) / ED Diagnoses   Final diagnoses:  Hyperglycemia  Perianal abscess    ED Discharge Orders    None       Landis Martins 08/28/18 1810    Fredia Sorrow, MD 09/12/18 [phone]

## 2018-08-28 NOTE — Telephone Encounter (Signed)
Attempted to contact patient - lmtcb . Other number had a mailbox that has not been set up.

## 2018-08-28 NOTE — Progress Notes (Signed)
Pharmacy Antibiotic Note  Matthew [name] is a 58 y.o. male admitted on 08/28/2018 with perirectal abscess.  Pharmacy has been consulted for vancomycin dosing.  Plan: Vancomycin 1000mg  IV every 8 hours.  Goal trough 15-20 mcg/mL.  Height: 5\' 9"  (175.3 cm) Weight: 220 lb (99.8 kg) IBW/kg (Calculated) : 70.7  Temp (24hrs), Avg:98.7 F (37.1 C), Min:98.7 F (37.1 C), Max:98.7 F (37.1 C)  Recent Labs  Lab 08/26/18 1205 08/28/18 1553  WBC 12.3* 10.3  CREATININE 1.15 0.79    Estimated Creatinine Clearance: 115.7 mL/min (by C-G formula based on SCr of 0.79 mg/dL).    Allergies  Allergen Reactions  . Penicillins Other (See Comments)    Did it involve swelling of the face/tongue/throat, SOB, or low BP? Unknown Did it involve sudden or severe rash/hives, skin peeling, or any reaction on the inside of your mouth or nose? Unknown Did you need to seek medical attention at a hospital or doctor's office? Unknown When did it last happen?childhood If all above answers are "NO", may proceed with cephalosporin use.     Antimicrobials this admission: 7/15 vancomycin >>   Microbiology results: 7/15 BCx: sent 7/15 Covid 19 Sent   Thank you for allowing pharmacy to be a part of this patient's care.  Donna Christen France Noyce 08/28/2018 6:58 PM

## 2018-08-29 DIAGNOSIS — K610 Anal abscess: Secondary | ICD-10-CM

## 2018-08-29 LAB — HEMOGLOBIN A1C
Hgb A1c MFr Bld: 13 % — ABNORMAL HIGH (ref 4.8–5.6)
Mean Plasma Glucose: 326.4 mg/dL

## 2018-08-29 LAB — COMPREHENSIVE METABOLIC PANEL
ALT: 21 U/L (ref 0–44)
AST: 12 U/L — ABNORMAL LOW (ref 15–41)
Albumin: 2.9 g/dL — ABNORMAL LOW (ref 3.5–5.0)
Alkaline Phosphatase: 84 U/L (ref 38–126)
Anion gap: 11 (ref 5–15)
BUN: 11 mg/dL (ref 6–20)
CO2: 24 mmol/L (ref 22–32)
Calcium: 8.2 mg/dL — ABNORMAL LOW (ref 8.9–10.3)
Chloride: 99 mmol/L (ref 98–111)
Creatinine, Ser: 0.59 mg/dL — ABNORMAL LOW (ref 0.61–1.24)
GFR calc Af Amer: >60 mL/min (ref >60)
GFR calc non Af Amer: >60 mL/min (ref >60)
Glucose, Bld: 286 mg/dL — ABNORMAL HIGH (ref 70–99)
Potassium: 3.4 mmol/L — ABNORMAL LOW (ref 3.5–5.1)
Sodium: 134 mmol/L — ABNORMAL LOW (ref 135–145)
Total Bilirubin: 1.2 mg/dL (ref 0.3–1.2)
Total Protein: 5.9 g/dL — ABNORMAL LOW (ref 6.5–8.1)

## 2018-08-29 LAB — CBC
HCT: 37.5 % — ABNORMAL LOW (ref 39.0–52.0)
Hemoglobin: 12.6 g/dL — ABNORMAL LOW (ref 13.0–17.0)
MCH: 30.1 pg (ref 26.0–34.0)
MCHC: 33.6 g/dL (ref 30.0–36.0)
MCV: 89.7 fL (ref 80.0–100.0)
Platelets: 193 10*3/uL (ref 150–400)
RBC: 4.18 MIL/uL — ABNORMAL LOW (ref 4.22–5.81)
RDW: 12.5 % (ref 11.5–15.5)
WBC: 10.7 10*3/uL — ABNORMAL HIGH (ref 4.0–10.5)
nRBC: 0 % (ref 0.0–0.2)

## 2018-08-29 LAB — GLUCOSE, CAPILLARY
Glucose-Capillary: 252 mg/dL — ABNORMAL HIGH (ref 70–99)
Glucose-Capillary: 337 mg/dL — ABNORMAL HIGH (ref 70–99)
Glucose-Capillary: 293 mg/dL — ABNORMAL HIGH (ref 70–99)
Glucose-Capillary: 297 mg/dL — ABNORMAL HIGH (ref 70–99)

## 2018-08-29 LAB — PROTIME-INR
INR: 1 (ref 0.8–1.2)
Prothrombin Time: 13.1 seconds (ref 11.4–15.2)

## 2018-08-29 MED ORDER — FLUTICASONE-UMECLIDIN-VILANT 100-62.5-25 MCG/INH IN AEPB: 1.0000 | INHALATION_SPRAY | Freq: Every day | RESPIRATORY_TRACT | Status: DC

## 2018-08-29 MED ORDER — FLUTICASONE FUROATE-VILANTEROL 100-25 MCG/INH IN AEPB
1.0000 | INHALATION_SPRAY | Freq: Every day | RESPIRATORY_TRACT | Status: DC
  Administered 2018-08-30: 1 via RESPIRATORY_TRACT
  Filled 2018-08-29: qty 28

## 2018-08-29 MED ORDER — APIXABAN 5 MG PO TABS
5.0000 mg | ORAL_TABLET | Freq: Two times a day (BID) | ORAL | Status: DC
  Administered 2018-08-29 – 2018-08-30 (×3): 5 mg via ORAL
  Filled 2018-08-29 (×3): qty 1

## 2018-08-29 MED ORDER — UMECLIDINIUM BROMIDE 62.5 MCG/INH IN AEPB
1.0000 | INHALATION_SPRAY | Freq: Every day | RESPIRATORY_TRACT | Status: DC
  Administered 2018-08-30: 1 via RESPIRATORY_TRACT
  Filled 2018-08-29: qty 7

## 2018-08-29 NOTE — TOC Initial Note (Addendum)
Transition of Care [facility]) - Initial/Assessment Note    Patient Details  Name: Matthew [name] MRN: [id number] Date of Birth: July 26, 1960  [facility] of [facility]) CM/SW Contact:    Chastin Riesgo, Chauncey Reading, RN Phone Number: 08/29/2018, 3:20 PM  Clinical Narrative:  Peri rectal abscess, new DM. Met with patient at bedside to discuss options for home health. Patient agreeable. Upon further discussion, patient mentions he is living in 3 bedroom tent. Has been there for about a month, reports this more like camping rather than living in a tent and he plans to ask a friend if he can live with him for a few months. He is a poor historian of events. Asked if I could call his son, he is agreeable. Left VM asking for return call. Patient does have Medicare and Medicaid. He is [facility] eligible.  Unable to order [facility] RN for patient until he has a physical address. Will refer to[facility].              ADDENDUM: 08/30/18: Spoke with one of patient's room mates [facility] [phone]). She plans to help patient manage his DM. She has had experience with her dad and BIL. Plan is for them to get housing the 1st of the month.  Patient shares that they do have access to a bath house, refrigerator, and has air condition in the tent. Alveda Reasons also shares that her daughter lives in a trailer right behind camp ground.       CM will provide housing options for [facility] and number for [employer] for other resources in the area.  Alveda Reasons has a phone and transportation to help patient.  Updated her on [facility] and will add her number to chart for follow up as patient's phone may not always be charged. Lavita agreeable.  Have made a f/u appt with PCP, Lavita can transport. [facility].  Expected Discharge Plan: Home/Self Care     Patient Goals and CMS Choice Patient states their goals for this hospitalization and ongoing recovery are:: feel better CMS Medicare.gov Compare Post Acute  Care list provided to:: Patient Choice offered to / list presented to : Patient  Expected Discharge Plan and Services Expected Discharge Plan: Home/Self Care       Living arrangements for the past 2 months: Homeless                       Prior Living Arrangements/Services Living arrangements for the past 2 months: Homeless Lives with:: Other (Comment), Minor Children Patient language and need for interpreter reviewed:: Yes Do you feel safe going back to the place where you live?: Yes      Need for Family Participation in Patient Care: Yes (Comment)     Criminal Activity/Legal Involvement Pertinent to Current Situation/Hospitalization: No - Comment as needed  Activities of Daily Living Home Assistive Devices/Equipment: Other (Comment)(inhalers) ADL Screening (condition at time of admission) Patient's cognitive ability adequate to safely complete daily activities?: Yes Is the patient deaf or have difficulty hearing?: No Does the patient have difficulty seeing, even when wearing glasses/contacts?: No Does the patient have difficulty concentrating, remembering, or making decisions?: No Patient able to express need for assistance with ADLs?: Yes Does the patient have difficulty dressing or bathing?: No Independently performs ADLs?: Yes (appropriate for developmental age) Does the patient have difficulty walking or climbing stairs?: No Weakness of Legs: Both Weakness of Arms/Hands: None  Permission Sought/Granted  Permission sought to share information with : Other (comment)       Permission granted to share info w AGENCY: [facility]        Emotional Assessment   Attitude/Demeanor/Rapport: Engaged Affect (typically observed): Accepting Orientation: : Oriented to Self, Oriented to Place, Oriented to  Time   Psych Involvement: No (comment)  Admission diagnosis:  Perianal abscess [K61.0] Hyperglycemia [R73.9] Patient Active Problem List   Diagnosis Date Noted  . Perianal  abscess   . Perirectal abscess 08/28/2018  . PAF (paroxysmal atrial fibrillation) ([place]) 08/28/2018  . Newly diagnosed diabetes ([place]) 08/28/2018  . Unable to read or write 05/31/2018  . Canker sores oral   . Bradycardia   . Stroke ([place]) 03/21/2018  . Left hemiparesis ([place])   . Hypomagnesemia   . Hypokalemia 03/20/2018  . Atrial fibrillation with RVR ([place]) 03/20/2018  . Hyponatremia 03/20/2018  . DDD (degenerative disc disease), lumbar 11/13/2017  . Femoroacetabular impingement of both hips 11/13/2017  . Late effect of cerebrovascular accident (CVA) 10/25/2017  . Acute CVA (cerebrovascular accident) ([place]) 10/22/2017  . Tobacco use 10/22/2017  . Hyperglycemia 10/22/2017  . Depression 12/25/2016  . Abnormal drug screen 11/02/2016  . Pain management contract broken 03/09/2016  . Coronary artery calcification 01/18/2016  . Thoracic aortic atherosclerosis ([place]) 01/18/2016  . Chronic back pain 08/03/2015  . Obesity (BMI 30-39.9) 03/09/2015  . Erectile dysfunction 08/24/2014  . Vitamin D deficiency 04/18/2014  . Hyperlipidemia 04/18/2014  . COPD (chronic obstructive pulmonary disease) ([place]) 04/17/2014  . Weakness 08/22/2012  . Chronic pain syndrome 08/19/2012  . Adjustment disorder with mixed anxiety and depressed mood 05/11/2012  . Trigeminal neuralgia 05/11/2012  . HTN (hypertension) 05/11/2012   PCP:  [name], FNP Pharmacy[facility]   [id number] [place] [place]Phone: [phone]: [phone] [place] [address]Phone: [phone]: [phone]    Social Determinants of Health (SDOH) Interventions    Readmission Risk Interventions Readmission Risk Prevention Plan 08/29/2018  Transportation Screening Complete  [place]  Social Work Consult for [place] Planning/Counseling Complete  Palliative Care Screening Not Applicable  Medication Review (Press photographer Complete  Some recent data might be hidden

## 2018-08-29 NOTE — Progress Notes (Signed)
Discussed with patient process of self-administering insulin. Verbalized understanding and was able to demonstrate proper technique.

## 2018-08-29 NOTE — Consult Note (Signed)
Reason for Consult: Perianal abscess, left buttock Referring Physician: Dr. Edythe Clarity [name] is an 58 y.o. male.  HPI: Patient is a 57 year old white male newly diagnosed with uncontrolled diabetes mellitus who presented to the emergency room with worsening perirectal pain and swelling.  He states is been occurring over the past week, but seems to have gotten worse.  CT scan of the abdomen revealed a 5 cm perianal abscess to the left buttock.  No drainage has been noted.  He was also noted to have hyperglycemia.  He was admitted for control of his diabetes.  He states he has had multiple skin abscesses in the past that he has drained on his own.  He has never had a perirectal abscess.  His pain is 6 out of 10.  Past Medical History:  Diagnosis Date  . Abscess   . Anxiety   . Aphthous ulcer   . Arthritis   . Asthma   . Chronic bronchitis   . Chronic pain   . COPD (chronic obstructive pulmonary disease) ([place])   . CVA (cerebral vascular accident) ([place])   . Depression   . Hyperlipidemia   . Hypertension   . Trigeminal neuralgia   . Vertigo     Past Surgical History:  Procedure Laterality Date  . CHOLECYSTECTOMY    . HAND RECONSTRUCTION Right 1990's    Family History  Problem Relation Age of Onset  . Hypertension Mother   . Diabetes Mother   . Hypertension Father   . Diabetes Father   . Cancer Father   . Emphysema Father     Social History:  reports that he has been smoking cigarettes. He started smoking about 43 years ago. He has a 80.00 pack-year smoking history. He has never used smokeless tobacco. He reports previous alcohol use. He reports that he does not use drugs.  Allergies:  Allergies  Allergen Reactions  . Penicillins Other (See Comments)    Did it involve swelling of the face/tongue/throat, SOB, or low BP? Unknown Did it involve sudden or severe rash/hives, skin peeling, or any reaction on the inside of your mouth or nose? Unknown Did you need to seek medical  attention at a hospital or doctor's office? Unknown When did it last happen?childhood If all above answers are "NO", may proceed with cephalosporin use.     Medications: I have reviewed the patient's current medications.  Results for orders placed or performed during the hospital encounter of 08/28/18 (from the past 48 hour(s))  CBG monitoring, ED     Status: Abnormal   Collection Time: 08/28/18  3:21 PM  Result Value Ref Range   Glucose-Capillary 461 (H) 70 - 99 mg/dL  Urinalysis, Routine w reflex microscopic     Status: Abnormal   Collection Time: 08/28/18  3:35 PM  Result Value Ref Range   Color, Urine YELLOW YELLOW   APPearance CLEAR CLEAR   Specific Gravity, Urine 1.030 1.005 - 1.030   pH 6.0 5.0 - 8.0   Glucose, UA >=500 (A) NEGATIVE mg/dL   Hgb urine dipstick NEGATIVE NEGATIVE   Bilirubin Urine NEGATIVE NEGATIVE   Ketones, ur 20 (A) NEGATIVE mg/dL   Protein, ur NEGATIVE NEGATIVE mg/dL   Nitrite NEGATIVE NEGATIVE   Leukocytes,Ua NEGATIVE NEGATIVE   RBC / HPF 0-5 0 - 5 RBC/hpf   WBC, UA 0-5 0 - 5 WBC/hpf   Bacteria, UA NONE SEEN NONE SEEN    Comment: Performed at [facility], [address], [place],  [place] [address]  CBC with Differential     Status: Abnormal   Collection Time: 08/28/18  3:53 PM  Result Value Ref Range   WBC 10.3 4.0 - 10.5 K/uL   RBC 4.43 4.22 - 5.81 MIL/uL   Hemoglobin 13.5 13.0 - 17.0 g/dL   HCT 39.1 39.0 - 52.0 %   MCV 88.3 80.0 - 100.0 fL   MCH 30.5 26.0 - 34.0 pg   MCHC 34.5 30.0 - 36.0 g/dL   RDW 12.3 11.5 - 15.5 %   Platelets 189 150 - 400 K/uL   nRBC 0.0 0.0 - 0.2 %   Neutrophils Relative % 79 %   Neutro Abs 8.2 (H) 1.7 - 7.7 K/uL   Lymphocytes Relative 11 %   Lymphs Abs 1.1 0.7 - 4.0 K/uL   Monocytes Relative 8 %   Monocytes Absolute 0.8 0.1 - 1.0 K/uL   Eosinophils Relative 1 %   Eosinophils Absolute 0.1 0.0 - 0.5 K/uL   Basophils Relative 0 %   Basophils Absolute 0.0 0.0 - 0.1 K/uL   Immature Granulocytes 1 %   Abs Immature  Granulocytes 0.06 0.00 - 0.07 K/uL    Comment: Performed at [facility], [address], [place], [place] [address]  Comprehensive metabolic panel     Status: Abnormal   Collection Time: 08/28/18  3:53 PM  Result Value Ref Range   Sodium 130 (L) 135 - 145 mmol/L   Potassium 3.9 3.5 - 5.1 mmol/L   Chloride 94 (L) 98 - 111 mmol/L   CO2 24 22 - 32 mmol/L   Glucose, Bld 464 (H) 70 - 99 mg/dL   BUN 17 6 - 20 mg/dL   Creatinine, Ser 0.79 0.61 - 1.24 mg/dL   Calcium 8.1 (L) 8.9 - 10.3 mg/dL   Total Protein 6.3 (L) 6.5 - 8.1 g/dL   Albumin 3.2 (L) 3.5 - 5.0 g/dL   AST 14 (L) 15 - 41 U/L   ALT 24 0 - 44 U/L   Alkaline Phosphatase 101 38 - 126 U/L   Total Bilirubin 1.2 0.3 - 1.2 mg/dL   GFR calc non Af Amer >60 >60 mL/min   GFR calc Af Amer >60 >60 mL/min   Anion gap 12 5 - 15    Comment: Performed at [facility], [address] [place], [place] [address]  TSH     Status: Abnormal   Collection Time: 08/28/18  3:53 PM  Result Value Ref Range   TSH 0.292 (L) 0.350 - 4.500 uIU/mL    Comment: Performed by a 3rd Generation assay with a functional sensitivity of <=0.01 uIU/mL. Performed at [facility], [address], [place], [place] [address]   POC CBG, ED     Status: Abnormal   Collection Time: 08/28/18  5:31 PM  Result Value Ref Range   Glucose-Capillary 331 (H) 70 - 99 mg/dL   Comment 1 Notify RN   Blood culture (routine x 2)     Status: None (Preliminary result)   Collection Time: 08/28/18  6:24 PM   Specimen: BLOOD  Result Value Ref Range   Specimen Description BLOOD RIGHT ANTECUBITAL    Special Requests      BOTTLES DRAWN AEROBIC AND ANAEROBIC Blood Culture adequate volume   Culture      NO GROWTH < 12 HOURS Performed at [facility], [address], [place], [place] [address]    Report Status PENDING   SARS Coronavirus 2 (CEPHEID - Performed in [place] hospital  lab), Hosp Order     Status: None   Collection Time: 08/28/18  6:30 PM   Specimen: Nasopharyngeal Swab  Result  Value Ref Range   SARS Coronavirus 2 NEGATIVE NEGATIVE    Comment: (NOTE) If result is NEGATIVE SARS-CoV-2 target nucleic acids are NOT DETECTED. The SARS-CoV-2 RNA is generally detectable in upper and lower  respiratory specimens during the acute phase of infection. The lowest  concentration of SARS-CoV-2 viral copies this assay can detect is 250  copies / mL. A negative result does not preclude SARS-CoV-2 infection  and should not be used as the sole basis for treatment or other  patient management decisions.  A negative result may occur with  improper specimen collection / handling, submission of specimen other  than nasopharyngeal swab, presence of viral mutation(s) within the  areas targeted by this assay, and inadequate number of viral copies  (<250 copies / mL). A negative result must be combined with clinical  observations, patient history, and epidemiological information. If result is POSITIVE SARS-CoV-2 target nucleic acids are DETECTED. The SARS-CoV-2 RNA is generally detectable in upper and lower  respiratory specimens dur ing the acute phase of infection.  Positive  results are indicative of active infection with SARS-CoV-2.  Clinical  correlation with patient history and other diagnostic information is  necessary to determine patient infection status.  Positive results do  not rule out bacterial infection or co-infection with other viruses. If result is PRESUMPTIVE POSTIVE SARS-CoV-2 nucleic acids MAY BE PRESENT.   A presumptive positive result was obtained on the submitted specimen  and confirmed on repeat testing.  While 2019 novel coronavirus  (SARS-CoV-2) nucleic acids may be present in the submitted sample  additional confirmatory testing may be necessary for epidemiological  and / or clinical management purposes  to differentiate between  SARS-CoV-2 and other Sarbecovirus currently known to infect humans.  If clinically indicated additional testing with an  alternate test  methodology [id number]) is advised. The SARS-CoV-2 RNA is generally  detectable in upper and lower respiratory sp ecimens during the acute  phase of infection. The expected result is Negative. Fact Sheet for Patients:  StrictlyIdeas.no Fact Sheet for Healthcare Providers: BankingDealers.co.za This test is not yet approved or cleared by the [place] FDA and has been authorized for detection and/or diagnosis of SARS-CoV-2 by FDA under an Emergency Use Authorization (EUA).  This EUA will remain in effect (meaning this test can be used) for the duration of the COVID-19 declaration under Section 564(b)(1) of the Act, 21 U.S.C. section 360bbb-3(b)(1), unless the authorization is terminated or revoked sooner. Performed at [facility], [address] [place], [place] [address]   Blood culture (routine x 2)     Status: None (Preliminary result)   Collection Time: 08/28/18  6:30 PM   Specimen: BLOOD  Result Value Ref Range   Specimen Description BLOOD LEFT ANTECUBITAL    Special Requests      BOTTLES DRAWN AEROBIC AND ANAEROBIC Blood Culture adequate volume   Culture      NO GROWTH < 12 HOURS Performed at [facility], [address] [place], [place] [address]    Report Status PENDING   Glucose, capillary     Status: Abnormal   Collection Time: 08/28/18  8:03 PM  Result Value Ref Range   Glucose-Capillary 337 (H) 70 - 99 mg/dL   Comment 1 Notify RN   Glucose, capillary     Status: Abnormal   Collection Time: 08/28/18 10:55 PM  Result Value Ref Range   Glucose-Capillary 402 (H) 70 - 99 mg/dL   Comment 1 Notify RN   Comprehensive metabolic panel     Status: Abnormal   Collection Time: 08/29/18  4:06 AM  Result Value Ref Range   Sodium 134 (L) 135 - 145 mmol/L   Potassium 3.4 (L) 3.5 - 5.1 mmol/L   Chloride 99 98 - 111 mmol/L   CO2 24 22 - 32 mmol/L   Glucose, Bld 286 (H) 70 - 99 mg/dL   BUN 11 6 - 20 mg/dL    Creatinine, Ser 0.59 (L) 0.61 - 1.24 mg/dL   Calcium 8.2 (L) 8.9 - 10.3 mg/dL   Total Protein 5.9 (L) 6.5 - 8.1 g/dL   Albumin 2.9 (L) 3.5 - 5.0 g/dL   AST 12 (L) 15 - 41 U/L   ALT 21 0 - 44 U/L   Alkaline Phosphatase 84 38 - 126 U/L   Total Bilirubin 1.2 0.3 - 1.2 mg/dL   GFR calc non Af Amer >60 >60 mL/min   GFR calc Af Amer >60 >60 mL/min   Anion gap 11 5 - 15    Comment: Performed at [facility], [address] [place], [place] [address]  CBC     Status: Abnormal   Collection Time: 08/29/18  4:06 AM  Result Value Ref Range   WBC 10.7 (H) 4.0 - 10.5 K/uL   RBC 4.18 (L) 4.22 - 5.81 MIL/uL   Hemoglobin 12.6 (L) 13.0 - 17.0 g/dL   HCT 37.5 (L) 39.0 - 52.0 %   MCV 89.7 80.0 - 100.0 fL   MCH 30.1 26.0 - 34.0 pg   MCHC 33.6 30.0 - 36.0 g/dL   RDW 12.5 11.5 - 15.5 %   Platelets 193 150 - 400 K/uL   nRBC 0.0 0.0 - 0.2 %    Comment: Performed at [facility], [address] [place], [place] [address]  Protime-INR     Status: None   Collection Time: 08/29/18  4:06 AM  Result Value Ref Range   Prothrombin Time 13.1 11.4 - 15.2 seconds   INR 1.0 0.8 - 1.2    Comment: (NOTE) INR goal varies based on device and disease states. Performed at [facility] [address] [place], [place] [address]   Glucose, capillary     Status: Abnormal   Collection Time: 08/29/18  7:36 AM  Result Value Ref Range   Glucose-Capillary 252 (H) 70 - 99 mg/dL    Ct Pelvis W Contrast  Result Date: 08/28/2018 CLINICAL DATA:  Rectal pain for the past 1.5 weeks. Clinically diagnosed abscess in the anal/perirectal region. EXAM: CT PELVIS WITH CONTRAST TECHNIQUE: Multidetector CT imaging of the pelvis was performed using the standard protocol following the bolus administration of intravenous contrast. CONTRAST:  132mL OMNIPAQUE IOHEXOL 300 MG/ML  SOLN COMPARISON:  11/01/2017. FINDINGS: Urinary Tract:  No abnormality visualized. Bowel: Mild sigmoid colon diverticulosis. Mildly prominent stool in the colon,  including the rectum. Unremarkable small bowel and appendix. Vascular/Lymphatic: Atheromatous arterial calcifications without aneurysm. No enlarged lymph nodes. Reproductive:  Unremarkable prostate gland. Other: Left perianal fluid collection with a moderately thickened, mildly enhancing surrounding rim of soft tissue. This measures 5.1 x 3.6 cm on image number 55 series 2 and 5.1 cm in length on coronal image number 92. Soft tissue stranding in the adjacent subcutaneous fat. Also noted is a tiny umbilical hernia containing fat. Musculoskeletal: Lumbar and lower thoracic spine degenerative changes. Mild bilateral hip degenerative changes.  IMPRESSION: 1. 5.1 x 3.6 x 5.1 cm left perianal abscess. 2. Mild sigmoid diverticulosis. 3. Tiny umbilical hernia containing fat. Electronically Signed   By: Claudie Revering M.D.   On: 08/28/2018 17:04    ROS:  Pertinent items are noted in HPI.  Blood pressure 112/64, pulse 70, temperature 98.8 F (37.1 C), temperature source Oral, resp. rate 17, height 5\' 9"  (1.753 m), weight 102.2 kg, SpO2 97 %. Physical Exam: Pleasant white male no acute distress Rectal examination reveals a large fluctuant area to the left of the anal verge.  Rectal examination was limited secondary to discomfort. Procedure note: Verbal informed consent was obtained.  I did offer to take the patient to the operating room for drainage.  He stated that he wanted to proceed with the drainage at bedside.  The area was prepped with ChloraPrep.  Incision and drainage was performed.  Purulent fluid was expressed.  A culture was sent.  He tolerated the procedure very well.   Assessment/Plan: Impression: Perianal abscess, uncontrolled diabetes mellitus Plan: We will advance to carb modified diet as he does not need operative intervention now.  Cultures are pending.  Aviva Signs 08/29/2018, 8:15 AM

## 2018-08-29 NOTE — Progress Notes (Signed)
PROGRESS NOTE    Matthew [name]  NTZ:[id number] DOB: 02/10/1961 DOA: 08/28/2018 PCP: [name], FNP   Brief Narrative:  Per HPI: Matthew [name] is a 58 y.o. male with medical history significant of paroxysmal atrial fibrillation on Eliquis, COPD, hypertension, hyperlipidemia, obesity and several other comorbidities was sent to the emergency department by his PCP.  Reportedly, patient went to see his PCP 2 days ago for a regular visit.  He ended up having basic lab work and went today the results came back to his PCP, his blood sugar was 858 and sodium was 120.  He was called by PCP office to come to the emergency department.  According to his PCPs note 2 days ago, patient did not have any complaint however today patient tells me that he has been having weakness along with polyuria and polydipsia as well as some blurry vision for past 3 weeks.  Once arrived to the emergency department, he also told ED physician that he has noticed a left buttock bump also about 3 weeks.  He denied any fever, chills, sweating, chest pain, shortness of breath, nausea, vomiting, any problem with urination or with bowel movements.  Patient was admitted with a perirectal abscess and has undergone I&D per general surgery this morning with cultures pending.  He currently remains on vancomycin and cefepime.  He also appears to have new onset diabetes and was noted to be hyperglycemic with improved blood glucose control this morning.  Assessment & Plan:   Active Problems:   HTN (hypertension)   Weakness   COPD (chronic obstructive pulmonary disease) (HCC)   Obesity (BMI 30-39.9)   Hyperglycemia   Perirectal abscess   PAF (paroxysmal atrial fibrillation) (HCC)   Newly diagnosed diabetes ([place])   Perianal abscess   Perirectal abscess status post I&D 7/16 -Await final cultures -Continue maintain on cefepime and vancomycin empirically for now -Restart Eliquis  Hyperglycemia with likely new onset diabetes  -Continue current SSI and Lantus -Hemoglobin A1c pending -Appreciate diabetes coordinator recommendations  Hypertension -Stable pressures noted and will hold amlodipine and lisinopril for now -Continue home Coreg  Paroxysmal atrial fibrillation-currently NSR -Continue Coreg -Restart Eliquis  Dyslipidemia -Continue atorvastatin  DVT prophylaxis: Resume Eliquis today Code Status: Full Family Communication: None at bedside Disposition Plan: Await further microbiology results and maintain on antibiotics.  Diabetes coordinator consulted with A1c pending.   Consultants:   General surgery  Procedures:   Perirectal I&D 7/16  Antimicrobials:   Vancomycin and cefepime 7/15->   Subjective: Patient seen and evaluated today with no new acute complaints or concerns. No acute concerns or events noted overnight.  He is doing well after I&D this morning.  Objective: Vitals:   08/28/18 2000 08/28/18 2256 08/29/18 0500 08/29/18 0506  BP:  117/64  112/64  Pulse:  74  70  Resp:  16  17  Temp:    98.8 F (37.1 C)  TempSrc:    Oral  SpO2: 96% 98%  97%  Weight:   102.2 kg   Height:        Intake/Output Summary (Last 24 hours) at 08/29/2018 1039 Last data filed at 08/29/2018 0500 Gross per 24 hour  Intake 997.34 ml  Output 1400 ml  Net -402.66 ml   Filed Weights   08/28/18 1521 08/29/18 0500  Weight: 99.8 kg 102.2 kg    Examination:  General exam: Appears calm and comfortable  Respiratory system: Clear to auscultation. Respiratory effort normal. Cardiovascular system: S1 & S2 heard,  RRR. No JVD, murmurs, rubs, gallops or clicks. No pedal edema. Gastrointestinal system: Abdomen is nondistended, soft and nontender. No organomegaly or masses felt. Normal bowel sounds heard. Central nervous system: Alert and oriented. No focal neurological deficits. Extremities: Symmetric 5 x 5 power. Skin: No rashes, lesions or ulcers Psychiatry: Judgement and insight appear normal. Mood &  affect appropriate.     Data Reviewed: I have personally reviewed following labs and imaging studies  CBC: Recent Labs  Lab 08/26/18 1205 08/28/18 1553 08/29/18 0406  WBC 12.3* 10.3 10.7*  NEUTROABS 9.5* 8.2*  --   HGB 14.3 13.5 12.6*  HCT 44.0 39.1 37.5*  MCV 93 88.3 89.7  PLT 228 189 161   Basic Metabolic Panel: Recent Labs  Lab 08/26/18 1205 08/28/18 1553 08/29/18 0406  NA 120* 130* 134*  K 5.0 3.9 3.4*  CL 78* 94* 99  CO2 21 24 24   GLUCOSE 858* 464* 286*  BUN 16 17 11   CREATININE 1.15 0.79 0.59*  CALCIUM 9.0 8.1* 8.2*   GFR: Estimated Creatinine Clearance: 117.1 mL/min (A) (by C-G formula based on SCr of 0.59 mg/dL (L)). Liver Function Tests: Recent Labs  Lab 08/26/18 1205 08/28/18 1553 08/29/18 0406  AST 17 14* 12*  ALT 32 24 21  ALKPHOS 179* 101 84  BILITOT 1.0 1.2 1.2  PROT 6.9 6.3* 5.9*  ALBUMIN 4.1 3.2* 2.9*   No results for input(s): LIPASE, AMYLASE in the last 168 hours. No results for input(s): AMMONIA in the last 168 hours. Coagulation Profile: Recent Labs  Lab 08/29/18 0406  INR 1.0   Cardiac Enzymes: No results for input(s): CKTOTAL, CKMB, CKMBINDEX, TROPONINI in the last 168 hours. BNP (last 3 results) No results for input(s): PROBNP in the last 8760 hours. HbA1C: No results for input(s): HGBA1C in the last 72 hours. CBG: Recent Labs  Lab 08/28/18 1521 08/28/18 1731 08/28/18 2003 08/28/18 2255 08/29/18 0736  GLUCAP 461* 331* 337* 402* 252*   Lipid Profile: Recent Labs    08/26/18 1205  CHOL 120  HDL 42  LDLCALC 37  TRIG 204*  CHOLHDL 2.9   Thyroid Function Tests: Recent Labs    08/28/18 1553  TSH 0.292*   Anemia Panel: No results for input(s): VITAMINB12, FOLATE, FERRITIN, TIBC, IRON, RETICCTPCT in the last 72 hours. Sepsis Labs: No results for input(s): PROCALCITON, LATICACIDVEN in the last 168 hours.  Recent Results (from the past 240 hour(s))  Blood culture (routine x 2)     Status: None (Preliminary  result)   Collection Time: 08/28/18  6:24 PM   Specimen: BLOOD  Result Value Ref Range Status   Specimen Description BLOOD RIGHT ANTECUBITAL  Final   Special Requests   Final    BOTTLES DRAWN AEROBIC AND ANAEROBIC Blood Culture adequate volume   Culture   Final    NO GROWTH < 12 HOURS Performed at [facility], [address] [place], [place] [address]    Report Status PENDING  Incomplete  SARS Coronavirus 2 (CEPHEID - Performed in [name] hospital lab), Hosp Order     Status: None   Collection Time: 08/28/18  6:30 PM   Specimen: Nasopharyngeal Swab  Result Value Ref Range Status   SARS Coronavirus 2 NEGATIVE NEGATIVE Final    Comment: (NOTE) If result is NEGATIVE SARS-CoV-2 target nucleic acids are NOT DETECTED. The SARS-CoV-2 RNA is generally detectable in upper and lower  respiratory specimens during the acute phase of infection. The lowest  concentration of SARS-CoV-2 viral copies  this assay can detect is 250  copies / mL. A negative result does not preclude SARS-CoV-2 infection  and should not be used as the sole basis for treatment or other  patient management decisions.  A negative result may occur with  improper specimen collection / handling, submission of specimen other  than nasopharyngeal swab, presence of viral mutation(s) within the  areas targeted by this assay, and inadequate number of viral copies  (<250 copies / mL). A negative result must be combined with clinical  observations, patient history, and epidemiological information. If result is POSITIVE SARS-CoV-2 target nucleic acids are DETECTED. The SARS-CoV-2 RNA is generally detectable in upper and lower  respiratory specimens dur ing the acute phase of infection.  Positive  results are indicative of active infection with SARS-CoV-2.  Clinical  correlation with patient history and other diagnostic information is  necessary to determine patient infection status.  Positive results do  not rule out  bacterial infection or co-infection with other viruses. If result is PRESUMPTIVE POSTIVE SARS-CoV-2 nucleic acids MAY BE PRESENT.   A presumptive positive result was obtained on the submitted specimen  and confirmed on repeat testing.  While 2019 novel coronavirus  (SARS-CoV-2) nucleic acids may be present in the submitted sample  additional confirmatory testing may be necessary for epidemiological  and / or clinical management purposes  to differentiate between  SARS-CoV-2 and other Sarbecovirus currently known to infect humans.  If clinically indicated additional testing with an alternate test  methodology [phone]) is advised. The SARS-CoV-2 RNA is generally  detectable in upper and lower respiratory sp ecimens during the acute  phase of infection. The expected result is Negative. Fact Sheet for Patients:  StrictlyIdeas.no Fact Sheet for Healthcare Providers: BankingDealers.co.za This test is not yet approved or cleared by the [place] FDA and has been authorized for detection and/or diagnosis of SARS-CoV-2 by FDA under an Emergency Use Authorization (EUA).  This EUA will remain in effect (meaning this test can be used) for the duration of the COVID-19 declaration under Section 564(b)(1) of the Act, 21 U.S.C. section 360bbb-3(b)(1), unless the authorization is terminated or revoked sooner. Performed at [facility], [address], [place], [place] [address]   Blood culture (routine x 2)     Status: None (Preliminary result)   Collection Time: 08/28/18  6:30 PM   Specimen: BLOOD  Result Value Ref Range Status   Specimen Description BLOOD LEFT ANTECUBITAL  Final   Special Requests   Final    BOTTLES DRAWN AEROBIC AND ANAEROBIC Blood Culture adequate volume   Culture   Final    NO GROWTH < 12 HOURS Performed at [facility], [address], [place], [place] [address]    Report Status PENDING  Incomplete         Radiology  Studies: Ct Pelvis W Contrast  Result Date: 08/28/2018 CLINICAL DATA:  Rectal pain for the past 1.5 weeks. Clinically diagnosed abscess in the anal/perirectal region. EXAM: CT PELVIS WITH CONTRAST TECHNIQUE: Multidetector CT imaging of the pelvis was performed using the standard protocol following the bolus administration of intravenous contrast. CONTRAST:  165mL OMNIPAQUE IOHEXOL 300 MG/ML  SOLN COMPARISON:  11/01/2017. FINDINGS: Urinary Tract:  No abnormality visualized. Bowel: Mild sigmoid colon diverticulosis. Mildly prominent stool in the colon, including the rectum. Unremarkable small bowel and appendix. Vascular/Lymphatic: Atheromatous arterial calcifications without aneurysm. No enlarged lymph nodes. Reproductive:  Unremarkable prostate gland. Other: Left perianal fluid collection with a moderately thickened, mildly enhancing surrounding rim  of soft tissue. This measures 5.1 x 3.6 cm on image number 55 series 2 and 5.1 cm in length on coronal image number 92. Soft tissue stranding in the adjacent subcutaneous fat. Also noted is a tiny umbilical hernia containing fat. Musculoskeletal: Lumbar and lower thoracic spine degenerative changes. Mild bilateral hip degenerative changes. IMPRESSION: 1. 5.1 x 3.6 x 5.1 cm left perianal abscess. 2. Mild sigmoid diverticulosis. 3. Tiny umbilical hernia containing fat. Electronically Signed   By: Claudie Revering M.D.   On: 08/28/2018 17:04        Scheduled Meds: . aspirin EC  81 mg Oral Daily  . atorvastatin  40 mg Oral q1800  . baclofen  10 mg Oral TID  . carvedilol  3.125 mg Oral BID WC  . DULoxetine  30 mg Oral Daily  . fluticasone  2 spray Each Nare Daily  . Fluticasone-Umeclidin-Vilant  1 puff Inhalation Daily  . gabapentin  600 mg Oral BID   And  . gabapentin  900 mg Oral QHS  . guaiFENesin  600 mg Oral BID  . insulin aspart  0-15 Units Subcutaneous TID WC  . insulin aspart  0-5 Units Subcutaneous QHS  . insulin glargine  15 Units Subcutaneous  QHS  . levETIRAcetam  500 mg Oral BID  . loratadine  10 mg Oral Daily  . sodium chloride flush  3 mL Intravenous Q12H   Continuous Infusions: . 0.9 % NaCl with KCl 20 mEq / L 100 mL/hr at 08/28/18 2312  . ceFEPime (MAXIPIME) IV 1 g (08/29/18 0917)  . vancomycin 1,000 mg (08/29/18 0745)     LOS: 1 day    Time spent: 30 minutes    Matthew Gloster Darleen Crocker, DO [facility] Pager [phone]  If 7PM-7AM, please contact night-coverage [email or web address] Password [email or web address] 08/29/2018, 10:39 AM

## 2018-08-29 NOTE — Progress Notes (Addendum)
Inpatient Diabetes Program Recommendations  AACE/ADA: New Consensus Statement on Inpatient Glycemic Control (2015)  Target Ranges:  Prepandial:   less than 140 mg/dL      Peak postprandial:   less than 180 mg/dL (1-2 hours)      Critically ill patients:  140 - 180 mg/dL   Lab Results  Component Value Date   GLUCAP 337 (H) 08/29/2018   HGBA1C 5.6 10/23/2017  Results for [name], [name] (MRN [id number]) as of 08/29/2018 15:23  Ref. Range 08/28/2018 17:31 08/28/2018 20:03 08/28/2018 22:55 08/29/2018 07:36 08/29/2018 11:25  Glucose-Capillary Latest Ref Range: 70 - 99 mg/dL 331 (H) 337 (H) 402 (H) 252 (H) 337 (H)    Review of Glycemic Control A1C pending Diabetes history: ? New diagnosis Outpatient Diabetes medications: None Current orders for Inpatient glycemic control:  Lantus 15 units q HS Novolog moderate tid with meals and HS  Inpatient Diabetes Program Recommendations:    Note patient admitted with elevated blood sugars. A1C pending.  Based on current blood sugars, patient will likely need insulin at d/c.  Will order insulin starter kit and will have bedside RN's begin education.  Will also call patient to begin discussion regarding DM.    Addendum 1600:  Called patient by phone.  He states that he is unable to read and write.  He says that he has been having blurred vision for a while and wondered if it could be from the "sugar".  We discussed symptoms of high blood sugar including blurred vision, frequent urination and thirst.  He states that he has PCP and was checked for diabetes but did not have it. A1C is still pending? It appears that patient may need insulin at d/c.  Discussed this with patient and he asked if insulin can be given by mouth.  I explained that it is an injection and patient will be instructed on how to use insulin while in the hospital.  Patient states that [place] alone [place]  He is unsure where he will go after d/c.  Placed social work and care  management referral.  Discussed with RN and asked her to begin DM teaching including how to give insulin, checking blood sugars, and help patient watch DM videos.  Will also order dietician consult.    Will f/u on 7/17.   Thanks  Adah Perl, RN, BC-ADM Inpatient Diabetes Coordinator Pager [phone]

## 2018-08-30 LAB — CBC
HCT: 35.4 % — ABNORMAL LOW (ref 39.0–52.0)
Hemoglobin: 11.9 g/dL — ABNORMAL LOW (ref 13.0–17.0)
MCH: 30.4 pg (ref 26.0–34.0)
MCHC: 33.6 g/dL (ref 30.0–36.0)
MCV: 90.3 fL (ref 80.0–100.0)
Platelets: 172 10*3/uL (ref 150–400)
RBC: 3.92 MIL/uL — ABNORMAL LOW (ref 4.22–5.81)
RDW: 12.5 % (ref 11.5–15.5)
WBC: 7.2 10*3/uL (ref 4.0–10.5)
nRBC: 0 % (ref 0.0–0.2)

## 2018-08-30 LAB — BASIC METABOLIC PANEL
Anion gap: 9 (ref 5–15)
BUN: 10 mg/dL (ref 6–20)
CO2: 24 mmol/L (ref 22–32)
Calcium: 8.2 mg/dL — ABNORMAL LOW (ref 8.9–10.3)
Chloride: 102 mmol/L (ref 98–111)
Creatinine, Ser: 0.63 mg/dL (ref 0.61–1.24)
GFR calc Af Amer: >60 mL/min (ref >60)
GFR calc non Af Amer: >60 mL/min (ref >60)
Glucose, Bld: 294 mg/dL — ABNORMAL HIGH (ref 70–99)
Potassium: 3.6 mmol/L (ref 3.5–5.1)
Sodium: 135 mmol/L (ref 135–145)

## 2018-08-30 LAB — MAGNESIUM: Magnesium: 1.8 mg/dL (ref 1.7–2.4)

## 2018-08-30 LAB — GLUCOSE, CAPILLARY
Glucose-Capillary: 276 mg/dL — ABNORMAL HIGH (ref 70–99)
Glucose-Capillary: 325 mg/dL — ABNORMAL HIGH (ref 70–99)
Glucose-Capillary: 283 mg/dL — ABNORMAL HIGH (ref 70–99)

## 2018-08-30 MED ORDER — METRONIDAZOLE 500 MG PO TABS: 500.0000 mg | ORAL_TABLET | Freq: Three times a day (TID) | ORAL | 0 refills | Status: AC

## 2018-08-30 MED ORDER — BLOOD GLUCOSE METER KIT: PACK | 0 refills | Status: DC

## 2018-08-30 MED ORDER — ASPIRIN EC 81 MG PO TBEC: 81.0000 mg | DELAYED_RELEASE_TABLET | Freq: Every day | ORAL | 3 refills | Status: AC

## 2018-08-30 MED ORDER — CIPROFLOXACIN HCL 500 MG PO TABS: 500.0000 mg | ORAL_TABLET | Freq: Two times a day (BID) | ORAL | 0 refills | Status: AC

## 2018-08-30 MED ORDER — METFORMIN HCL 500 MG PO TABS: 500.0000 mg | ORAL_TABLET | Freq: Two times a day (BID) | ORAL | 11 refills | Status: DC

## 2018-08-30 NOTE — Progress Notes (Signed)
  Subjective: Patient denies any buttock pain.  States drainage has decreased significantly.  Objective: Vital [name] in last 24 hours: Temp:  [98.3 F (36.8 C)-98.4 F (36.9 C)] 98.4 F (36.9 C) (07/17 0527) Pulse Rate:  [57-78] 57 (07/17 0527) Resp:  [17] 17 (07/17 0527) BP: (108-155)/(65-85) 155/85 (07/17 0527) SpO2:  [95 %-96 %] 96 % (07/17 0527) Weight:  [103.5 kg] 103.5 kg (07/17 0500) Last BM Date: 08/28/18  Intake/Output from previous day: 07/16 0701 - 07/17 0700 In: 2892.3 [P.O.:1440; I.V.:852.3; IV Piggyback:600] Out: -  Intake/Output this shift: No intake/output data recorded.  General appearance: alert, cooperative and no distress Incision/Wound: Perianal abscess resolving.  No significant purulent drainage noted.  Lab Results:  Recent Labs    08/29/18 0406 08/30/18 0357  WBC 10.7* 7.2  HGB 12.6* 11.9*  HCT 37.5* 35.4*  PLT 193 172   BMET Recent Labs    08/29/18 0406 08/30/18 0357  NA 134* 135  K 3.4* 3.6  CL 99 102  CO2 24 24  GLUCOSE 286* 294*  BUN 11 10  CREATININE 0.59* 0.63  CALCIUM 8.2* 8.2*   PT/INR Recent Labs    08/29/18 0406  LABPROT 13.1  INR 1.0    Studies/Results: Ct Pelvis W Contrast  Result Date: 08/28/2018 CLINICAL DATA:  Rectal pain for the past 1.5 weeks. Clinically diagnosed abscess in the anal/perirectal region. EXAM: CT PELVIS WITH CONTRAST TECHNIQUE: Multidetector CT imaging of the pelvis was performed using the standard protocol following the bolus administration of intravenous contrast. CONTRAST:  159mL OMNIPAQUE IOHEXOL 300 MG/ML  SOLN COMPARISON:  11/01/2017. FINDINGS: Urinary Tract:  No abnormality visualized. Bowel: Mild sigmoid colon diverticulosis. Mildly prominent stool in the colon, including the rectum. Unremarkable small bowel and appendix. Vascular/Lymphatic: Atheromatous arterial calcifications without aneurysm. No enlarged lymph nodes. Reproductive:  Unremarkable prostate gland. Other: Left perianal fluid  collection with a moderately thickened, mildly enhancing surrounding rim of soft tissue. This measures 5.1 x 3.6 cm on image number 55 series 2 and 5.1 cm in length on coronal image number 92. Soft tissue stranding in the adjacent subcutaneous fat. Also noted is a tiny umbilical hernia containing fat. Musculoskeletal: Lumbar and lower thoracic spine degenerative changes. Mild bilateral hip degenerative changes. IMPRESSION: 1. 5.1 x 3.6 x 5.1 cm left perianal abscess. 2. Mild sigmoid diverticulosis. 3. Tiny umbilical hernia containing fat. Electronically Signed   By: Claudie Revering M.D.   On: 08/28/2018 17:04    Anti-infectives: Anti-infectives (From admission, onward)   Start     Dose/Rate Route Frequency Ordered Stop   08/29/18 0800  vancomycin (VANCOCIN) IVPB 1000 mg/200 mL premix     1,000 mg 200 mL/hr over 60 Minutes Intravenous Every 8 hours 08/28/18 1858     08/28/18 2000  ceFEPIme (MAXIPIME) 1 g in sodium chloride 0.9 % 100 mL IVPB     1 g 200 mL/hr over 30 Minutes Intravenous Every 8 hours 08/28/18 1917     08/28/18 1900  vancomycin (VANCOCIN) IVPB 1000 mg/200 mL premix     1,000 mg 200 mL/hr over 60 Minutes Intravenous  Once 08/28/18 1856 08/29/18 0013   08/28/18 1815  vancomycin (VANCOCIN) IVPB 1000 mg/200 mL premix     1,000 mg 200 mL/hr over 60 Minutes Intravenous  Once 08/28/18 1807 08/28/18 1920      Assessment/Plan: Impression: Perirectal abscess, resolving.  Cultures show mixed flora. Plan: Adjust antibiotics per culture results.  LOS: 2 days    Matthew [name] 08/30/2018

## 2018-08-30 NOTE — Progress Notes (Signed)
oo [facility]patient Diabetes Program Recommendations  AACE/ADA: New Consensus Statement on Inpatient Glycemic Control (2015)  Target Ranges:  Prepandial:   less than 140 mg/dL      Peak postprandial:   less than 180 mg/dL (1-2 hours)      Critically ill patients:  140 - 180 mg/dL   Lab Results  Component Value Date   GLUCAP 276 (H) 08/30/2018   HGBA1C 13.0 (H) 08/28/2018    Review of Glycemic Control Results for Matthew [name], Matthew [name] (MRN [id number]) as of 08/30/2018 10:39  Ref. Range 08/29/2018 07:36 08/29/2018 11:25 08/29/2018 16:02 08/29/2018 20:53 08/30/2018 07:26  Glucose-Capillary Latest Ref Range: 70 - 99 mg/dL 252 (H) 337 (H) 293 (H) 297 (H) 276 (H)   Diabetes history: New diagnosis of DM  Inpatient Diabetes Program Recommendations:    Spoke with MD regarding d/c plan for patient.  Due to current living conditions and illiteracy MD plans to d/c home on Metformin only for now.    Called patient to discuss diabetes diagnosis further.  He states that he has been drinking lots of cokes/pepsis and slurpies from the gas station due to thirst.  We discussed that he needs to cut out sugar from beverages and drink mostly water.  Patient verbalized understanding but did ask if "caffeine" free was ok.  I explained that he would need "diet"  Or sugar free as well.  Patient verbalized understanding.  He evidently has a friend who will help him with medications and checking blood sugars as well.  We discussed normal blood sugar values.  He states he is able to read numbers and is familiar with checking blood sugars from his father.  RN will teach patient how to stick finger and place blood on strip.  He will need rx. For glucose meter.  Encouraged patient to have pharmacist help him set-up his meter when he picks it up.  He also states that his friend will help.  Explained that a blood sugar < 70 is too low and means he needs to eat and explained that a blood sugar >200 is too high.  Patient was able to teach back.   Told him to check his blood sugar at least once a day and call MD if blood sugars are > 200 mg/dL.  We discussed that his medications for diabetes will likely need to be changed and he needs close f/u with PCP. He states that he see's [facility] and seems comfortable going there for f/u.  He states that his son has helped him create a color system for his medications.  Orange is for morning meds and purple is for evening meds.  If he takes 2 times a day then the bottles have both purple and orange.  He hopes to be able to get a more stable place to live next month.   Needs very close f/u.  He states he has insurance with medication coverage.  He did however state that food is scarce and he often eats what is cheap.  Encouraged increase in fresh veggies/fruits and he states that he has friends who can give him veggies from the garden.  Overall patient very engaged.  Encouraged him to f/u with MD or go to ED if symptoms of blurred vision, thirst, or frequent urination return.  Also explained that HIGH on glucose meter means blood sugar is too high to register and he needs f/u with MD ASAP.    Discussed with RN.  She will show him videos today  and f/u on meter/ glucose teaching.   Thanks  Adah Perl, RN, BC-ADM Inpatient Diabetes Coordinator Pager [phone] (8a-5p)

## 2018-08-30 NOTE — Progress Notes (Signed)
IV removed, D/C instructions reviewed with patient, verbalized understanding. Stable patient to be transported to private vehicle via wheelchair.

## 2018-08-30 NOTE — Plan of Care (Signed)
Nutrition Education Note  RD consulted for nutrition education regarding new onset diabetes.   Discussed pt social/environmental circumstances with CM RN. Pt has stable monthly income, though it is not much. He [place] in a large tent with 2 other adults and 2 of their children.   Lab Results  Component Value Date   HGBA1C 13.0 (H) 08/28/2018   Took brief diet recall. Pt is relatively hard to understand d/t thick accent. For breakfast, he says he eats eggs almost every day. He intermittently skips lunch. He eats "different stuff" for dinner. When asked for examples of meals, he just said "whatever we have". He has recently been drinking caffeine free soda (unsure if diet or not) and Icees. He says he plans to switch to flavored water.   Pt illiterate with poor level of comprehension. As such, RD simplified education and provided straightforward "do this/not that" recommendations. Focused on largest issues.  Noted sugar sweetened beverages like soda, sweet tea, juice and icees are all poor choices. He asked about choc milk and icecream. Again, noted anything sweetened with sugar is a poor choice. Recommended water, diet soda, unsweet tea/coffee, flavored water, white milk.   Reviewed importance of not skipping meals, especially as he will be discharging on insulin. Taking into consideration pts limited resources, RD noted eating something is better than eating nothing at all, but if able he should make sure to include a protein and vegetables. RD reviewed protein sources- meat, nuts, legumes. He asks about fish and deer. Gave approval to these.   RD listed the food groups that will raise BG- dairy, fruits, grains and starchy vegetables. We reviewed the starchy vs non starchy vegetables. He eats a lot of leafy greens and green beans. Gave approval to these. He asked about rice. Explained what foods are included in grain category- rice, noodles, pasta. He then asked about mac and cheese.  Again, noted this contains noodles and is not a good choice.   Touched on benefit of exercise. During day, he likes to work in garden, fish, or ride his scooter. Explained physical activity/exercise will also help him keep sugars low.   Summary of Recommendations: Dont drink: soda, sweet tea, choc milk, juice. Drink: Water, flavored water, diet soda, white milk Eat protein with meals (fish, pinot/navy/lima beans, deer, eggs, nuts) Avoid biscuits, pastas, rice Choose brown bread, not white bread Avoid starchy vegetables. Choose non starchy vegetables- can have as many of these as wants. Picture-only handouts list provided.  Exercise will help keep sugars low  Expect poor-fair compliance. Pt is motivated, but he is disadvantaged from a financial and educational standpoint. He had great difficult understanding simplest concepts. RD tried to keep education basic, simple, but feel he will need outpatient follow up and frequent reinforcement of concepts.   No further nutrition interventions warranted at this time. If additional nutrition issues arise, please re-consult RD.  Burtis Junes RD, LDN, CNSC Clinical Nutrition Available Tues-Sat via Pager: [phone] 08/30/2018 1:47 PM

## 2018-08-30 NOTE — TOC Transition Note (Addendum)
Transition of Care [facility]) - CM/SW Discharge Note   Patient Details  Name: TYMOTHY CASS MRN: [id number] Date of Birth: May 11, 1960  [facility] CM/SW Contact:  Florencia Zaccaro, Chauncey Reading, RN Phone Number: 08/30/2018, 2:40 PM   Clinical Narrative:  Alveda Reasons confirmed she will pick patient up today. RN updated. No other TOC needs.   ADDENDUM: Son called back, updated him on plan.     Final next level of care: Home/Self Care     Patient Goals and CMS Choice Patient states their goals for this hospitalization and ongoing recovery are:: feel better CMS Medicare.gov Compare Post Acute Care list provided to:: Patient Choice offered to / list presented to : Patient  Discharge Placement                       Discharge Plan and Services                                     Social Determinants of Health (SDOH) Interventions     Readmission Risk Interventions Readmission Risk Prevention Plan 08/30/2018 08/29/2018  Transportation Screening - Complete  PCP or Specialist Appt within 3-5 Days Complete -  [place]RI or [place] - Complete  Social Work Consult for [place] Planning/Counseling - Complete  Palliative Care Screening - Not Applicable  Medication Review [employer]) - Complete  Some recent data might be hidden

## 2018-08-30 NOTE — Discharge Summary (Signed)
Physician Discharge Summary  AVIN GIBBONS BCW:[id number] DOB: [birth year] DOA: 08/28/2018  PCP: [name], FNP  Admit date: 08/28/2018  Discharge date: 08/30/2018  Admitted From:Home  Disposition:  Home  Recommendations for Outpatient Follow-up:  1. Follow up with PCP in 1-2 weeks 2. Continue on metformin twice daily as prescribed and check blood glucose regularly.  Will likely require insulin in the near future given high A1c. 3. Continue on ciprofloxacin and Flagyl as prescribed and Dr. Arnoldo Morale of general surgery will follow-up final culture results as discussed. 4. Continue other home medications as prior  Home Health: None  Equipment/Devices: None  Discharge Condition: Stable  CODE STATUS: Full  Diet recommendation: Heart Healthy/carb modified  Brief/Interim Summary: Per HPI: Atticus R Mabeis a 58 y.o.malewith medical history significant ofparoxysmal atrial fibrillation on Eliquis, COPD, hypertension, hyperlipidemia, obesity and several other comorbidities was sent to the emergency department by his PCP. Reportedly, patient went to see his PCP 2 days ago for a regular visit. He ended up having basic lab work and went today the results came back to his PCP, his blood sugar was 858 and sodium was 120. He was called by PCP office to come to the emergency department. According to his PCPs note 2 days ago, patient did not have any complaint however today patient tells me that he has been having weakness along with polyuria and polydipsia as well as some blurry vision for past 3 weeks. Once arrived to the emergency department, he also told ED physician that he has noticed a left buttock bump also about 3 weeks. He denied any fever, chills, sweating, chest pain, shortness of breath, nausea, vomiting, any problem with urination or with bowel movements.  Patient was admitted with a perirectal abscess and has undergone I&D per general surgery on 7/16.  He has been treated with  vancomycin and cefepime for the last 2 days and will require 5 more days of empiric oral ciprofloxacin and Flagyl.  He is noted to have some gram-positive organisms noted in his culture thus far.  He is also noted to have new onset diabetes with A1c greater than 13%.  He was initially recommended to remain on insulin, however due to his illiteracy and poor understanding as well as poor socioeconomic situation, he will remain on metformin for now and have close follow-up with his PCP.  He has been prescribed a blood glucose monitor to check his sugars regularly and remain on a carb modified diet.  He is otherwise stable for discharge and will remain on his antibiotics as well as metformin as prescribed.  He is to follow-up as noted above.  No other acute events noted during the course of this admission.  Discharge Diagnoses:  Active Problems:   HTN (hypertension)   Weakness   COPD (chronic obstructive pulmonary disease) (HCC)   Obesity (BMI 30-39.9)   Hyperglycemia   Perirectal abscess   PAF (paroxysmal atrial fibrillation) (HCC)   Newly diagnosed diabetes ([place])   Perianal abscess  Principal discharge diagnosis: Perianal abscess status post I&D along with new onset diabetes.  Discharge Instructions  Discharge Instructions    Ambulatory referral to Nutrition and Diabetic Education   Complete by: As directed    New diagnosis of DM- A1C=13%- needs individual education (unable to read and write)-   Diet - low sodium heart healthy   Complete by: As directed    Increase activity slowly   Complete by: As directed      Allergies as of  08/30/2018      Reactions   Penicillins Other (See Comments)   Did it involve swelling of the face/tongue/throat, SOB, or low BP? Unknown Did it involve sudden or severe rash/hives, skin peeling, or any reaction on the inside of your mouth or nose? Unknown Did you need to seek medical attention at a hospital or doctor's office? Unknown When did it last  happen?childhood If all above answers are "NO", may proceed with cephalosporin use.      Medication List    STOP taking these medications   sulfamethoxazole-trimethoprim 800-160 MG tablet Commonly known as: Bactrim DS     TAKE these medications   albuterol 108 (90 Base) MCG/ACT inhaler Commonly known as: Ventolin HFA INHALE TWO PUFFS INTO LUNGS EVERY SIX HOURS AS NEEDED FOR WHEEZE OR SHORTNESS OF BREATH. What changed:   how much to take  how to take this  when to take this  reasons to take this  additional instructions   amLODipine 10 MG tablet Commonly known as: NORVASC Take 10 mg by mouth daily.   apixaban 5 MG Tabs tablet Commonly known as: Eliquis Take 1 tablet (5 mg total) by mouth 2 (two) times daily.   aspirin EC 81 MG tablet Take 1 tablet (81 mg total) by mouth daily.   atorvastatin 40 MG tablet Commonly known as: LIPITOR Take 1 tablet (40 mg total) by mouth daily at 6 PM.   B-complex with vitamin C tablet Take 1 tablet by mouth daily.   baclofen 10 MG tablet Commonly known as: LIORESAL Take 1 tablet (10 mg total) by mouth 3 (three) times daily.   blood glucose meter kit and supplies Dispense based on patient and insurance preference. Use up to four times daily as directed. (FOR ICD-10 E10.9, E11.9).   carvedilol 3.125 MG tablet Commonly known as: COREG TAKE 1 TABLET (3.125 MG TOTAL) BY MOUTH 2 (TWO) TIMES DAILY WITH A MEAL.   ciprofloxacin 500 MG tablet Commonly known as: Cipro Take 1 tablet (500 mg total) by mouth 2 (two) times daily for 5 days.   DULoxetine 30 MG capsule Commonly known as: Cymbalta Take 1 capsule (30 mg total) by mouth daily.   fluticasone 50 MCG/ACT nasal spray Commonly known as: FLONASE Place 2 sprays into both nostrils daily.   Fluticasone-Umeclidin-Vilant 100-62.5-25 MCG/INH Aepb Commonly known as: Trelegy Ellipta Inhale 1 puff into the lungs daily.   gabapentin 600 MG tablet Commonly known as: NEURONTIN TAKE  1 TABLET IN THE MORNING, 1 TABLET IN THE AFTERNOON, AND 1.5 TABLET AT BEDTIME AS DIRECTED What changed: See the new instructions.   guaiFENesin 600 MG 12 hr tablet Commonly known as: Mucinex Take 1 tablet (600 mg total) by mouth 2 (two) times daily.   levETIRAcetam 500 MG tablet Commonly known as: KEPPRA Take 1 tablet (500 mg total) by mouth 2 (two) times daily.   lisinopril 5 MG tablet Commonly known as: ZESTRIL Take 1 tablet (5 mg total) by mouth daily for 30 days.   loratadine 10 MG tablet Commonly known as: CLARITIN Take 1 tablet (10 mg total) by mouth daily.   metFORMIN 500 MG tablet Commonly known as: Glucophage Take 1 tablet (500 mg total) by mouth 2 (two) times daily with a meal.   metroNIDAZOLE 500 MG tablet Commonly known as: FLAGYL Take 1 tablet (500 mg total) by mouth 3 (three) times daily for 5 days.      Follow-up Information    [name], FNP Follow up.  Specialty: Family Medicine Why: July 23rd at 42: 40 hospital follow up appointment Contact information: [place] [address] [phone]          Allergies  Allergen Reactions  . Penicillins Other (See Comments)    Did it involve swelling of the face/tongue/throat, SOB, or low BP? Unknown Did it involve sudden or severe rash/hives, skin peeling, or any reaction on the inside of your mouth or nose? Unknown Did you need to seek medical attention at a hospital or doctor's office? Unknown When did it last happen?childhood If all above answers are "NO", may proceed with cephalosporin use.     Consultations:  General surgery-Dr. Arnoldo Morale   Procedures/Studies: Ct Pelvis W Contrast  Result Date: 08/28/2018 CLINICAL DATA:  Rectal pain for the past 1.5 weeks. Clinically diagnosed abscess in the anal/perirectal region. EXAM: CT PELVIS WITH CONTRAST TECHNIQUE: Multidetector CT imaging of the pelvis was performed using the standard protocol following the bolus  administration of intravenous contrast. CONTRAST:  12m OMNIPAQUE IOHEXOL 300 MG/ML  SOLN COMPARISON:  11/01/2017. FINDINGS: Urinary Tract:  No abnormality visualized. Bowel: Mild sigmoid colon diverticulosis. Mildly prominent stool in the colon, including the rectum. Unremarkable small bowel and appendix. Vascular/Lymphatic: Atheromatous arterial calcifications without aneurysm. No enlarged lymph nodes. Reproductive:  Unremarkable prostate gland. Other: Left perianal fluid collection with a moderately thickened, mildly enhancing surrounding rim of soft tissue. This measures 5.1 x 3.6 cm on image number 55 series 2 and 5.1 cm in length on coronal image number 92. Soft tissue stranding in the adjacent subcutaneous fat. Also noted is a tiny umbilical hernia containing fat. Musculoskeletal: Lumbar and lower thoracic spine degenerative changes. Mild bilateral hip degenerative changes. IMPRESSION: 1. 5.1 x 3.6 x 5.1 cm left perianal abscess. 2. Mild sigmoid diverticulosis. 3. Tiny umbilical hernia containing fat. Electronically Signed   By: SClaudie ReveringM.D.   On: 08/28/2018 17:04     Discharge Exam: Vitals:   08/30/18 0527 08/30/18 0755  BP: (!) 155/85   Pulse: (!) 57   Resp: 17   Temp: 98.4 F (36.9 C)   SpO2: 96% 98%   Vitals:   08/29/18 2140 08/30/18 0500 08/30/18 0527 08/30/18 0755  BP: 108/85  (!) 155/85   Pulse: 61  (!) 57   Resp: 17  17   Temp: 98.3 F (36.8 C)  98.4 F (36.9 C)   TempSrc: Oral     SpO2: 95%  96% 98%  Weight:  103.5 kg    Height:        General: Pt is alert, awake, not in acute distress Cardiovascular: RRR, S1/S2 +, no rubs, no gallops Respiratory: CTA bilaterally, no wheezing, no rhonchi Abdominal: Soft, NT, ND, bowel sounds + Extremities: no edema, no cyanosis    The results of significant diagnostics from this hospitalization (including imaging, microbiology, ancillary and laboratory) are listed below for reference.     Microbiology: Recent Results  (from the past 240 hour(s))  Blood culture (routine x 2)     Status: None (Preliminary result)   Collection Time: 08/28/18  6:24 PM   Specimen: BLOOD  Result Value Ref Range Status   Specimen Description BLOOD RIGHT ANTECUBITAL  Final   Special Requests   Final    BOTTLES DRAWN AEROBIC AND ANAEROBIC Blood Culture adequate volume   Culture   Final    NO GROWTH 2 DAYS [facility]formed at [facility] [address] [place] [address]   Report Status PENDING  Incomplete  SARS Coronavirus 2 (CEPHEID - Performed in [place] hospital lab), Hosp Order     Status: None   Collection Time: 08/28/18  6:30 PM   Specimen: Nasopharyngeal Swab  Result Value Ref Range Status   SARS Coronavirus 2 NEGATIVE NEGATIVE Final    Comment: (NOTE) If result is NEGATIVE SARS-CoV-2 target nucleic acids are NOT DETECTED. The SARS-CoV-2 RNA is generally detectable in upper and lower  respiratory specimens during the acute phase of infection. The lowest  concentration of SARS-CoV-2 viral copies this assay can detect is 250  copies / mL. A negative result does not preclude SARS-CoV-2 infection  and should not be used as the sole basis for treatment or other  patient management decisions.  A negative result may occur with  improper specimen collection / handling, submission of specimen other  than nasopharyngeal swab, presence of viral mutation(s) within the  areas targeted by this assay, and inadequate number of viral copies  (<250 copies / mL). A negative result must be combined with clinical  observations, patient history, and epidemiological information. If result is POSITIVE SARS-CoV-2 target nucleic acids are DETECTED. The SARS-CoV-2 RNA is generally detectable in upper and lower  respiratory specimens dur ing the acute phase of infection.  Positive  results are indicative of active infection with SARS-CoV-2.  Clinical  correlation with patient history and other diagnostic information is  necessary  to determine patient infection status.  Positive results do  not rule out bacterial infection or co-infection with other viruses. If result is PRESUMPTIVE POSTIVE SARS-CoV-2 nucleic acids MAY BE PRESENT.   A presumptive positive result was obtained on the submitted specimen  and confirmed on repeat testing.  While 2019 novel coronavirus  (SARS-CoV-2) nucleic acids may be present in the submitted sample  additional confirmatory testing may be necessary for epidemiological  and / or clinical management purposes  to differentiate between  SARS-CoV-2 and other Sarbecovirus currently known to infect humans.  If clinically indicated additional testing with an alternate test  methodology [phone]) is advised. The SARS-CoV-2 RNA is generally  detectable in upper and lower respiratory sp ecimens during the acute  phase of infection. The expected result is Negative. Fact Sheet for Patients:  StrictlyIdeas.no Fact Sheet for Healthcare Providers: BankingDealers.co.za This test is not yet approved or cleared by the [place] FDA and has been authorized for detection and/or diagnosis of SARS-CoV-2 by FDA under an Emergency Use Authorization (EUA).  This EUA will remain in effect (meaning this test can be used) for the duration of the COVID-19 declaration under Section 564(b)(1) of the Act, 21 U.S.C. section 360bbb-3(b)(1), unless the authorization is terminated or revoked sooner. Performed at [facility], [address] [place], [place] [address]   Blood culture (routine x 2)     Status: None (Preliminary result)   Collection Time: 08/28/18  6:30 PM   Specimen: BLOOD  Result Value Ref Range Status   Specimen Description BLOOD LEFT ANTECUBITAL  Final   Special Requests   Final    BOTTLES DRAWN AEROBIC AND ANAEROBIC Blood Culture adequate volume   Culture   Final    NO GROWTH 2 DAYS Performed at [facility], [address], [place], [place]  [address]    Report Status PENDING  Incomplete  Aerobic Culture (superficial specimen)     Status: None (Preliminary result)   Collection Time: 08/29/18  8:15 AM   Specimen: Buttocks  Result Value Ref Range Status   Specimen Description  Final    BUTTOCKS Performed at [facility], [address], [place], [place] [address]    Special Requests   Final    Immunocompromised Performed at [facility], [address], [place], [place] [address]    Gram Stain   Final    MODERATE WBC PRESENT, PREDOMINANTLY PMN FEW GRAM POSITIVE COCCI RARE GRAM POSITIVE RODS    Culture   Final    NO GROWTH < 24 HOURS Performed at [facility], [place] [address], [place], [place] [address]    Report Status PENDING  Incomplete     Labs: BNP (last 3 results) No results for input(s): BNP in the last 8760 hours. Basic Metabolic Panel: Recent Labs  Lab 08/26/18 1205 08/28/18 1553 08/29/18 0406 08/30/18 0357  NA 120* 130* 134* 135  K 5.0 3.9 3.4* 3.6  CL 78* 94* 99 102  CO2 _0 GLUCOSE 858* 464* 286* 294*  BUN _1 CREATININE 1.15 0.79 0.59* 0.63  CALCIUM 9.0 8.1* 8.2* 8.2*  MG  --   --   --  1.8   Liver Function Tests: Recent Labs  Lab 08/26/18 1205 08/28/18 1553 08/29/18 0406  AST 17 14* 12*  ALT 32 24 21  ALKPHOS 179* 101 84  BILITOT 1.0 1.2 1.2  PROT 6.9 6.3* 5.9*  ALBUMIN 4.1 3.2* 2.9*   No results for input(s): LIPASE, AMYLASE in the last 168 hours. No results for input(s): AMMONIA in the last 168 hours. CBC: Recent Labs  Lab 08/26/18 1205 08/28/18 1553 08/29/18 0406 08/30/18 0357  WBC 12.3* 10.3 10.7* 7.2  NEUTROABS 9.5* 8.2*  --   --   HGB 14.3 13.5 12.6* 11.9*  HCT 44.0 39.1 37.5* 35.4*  MCV 93 88.3 89.7 90.3  PLT 228 189 193 172   Cardiac Enzymes: No results for input(s): CKTOTAL, CKMB, CKMBINDEX, TROPONINI in the last 168 hours. BNP: Invalid input(s): POCBNP CBG: Recent Labs  Lab 08/29/18 1125 08/29/18 1602 08/29/18 2053 08/30/18 0726  08/30/18 1134  GLUCAP 337* 293* 297* 276* 325*   D-Dimer No results for input(s): DDIMER in the last 72 hours. Hgb A1c Recent Labs    08/28/18 1553  HGBA1C 13.0*   Lipid Profile No results for input(s): CHOL, HDL, LDLCALC, TRIG, CHOLHDL, LDLDIRECT in the last 72 hours. Thyroid function studies Recent Labs    08/28/18 1553  TSH 0.292*   Anemia work up No results for input(s): VITAMINB12, FOLATE, FERRITIN, TIBC, IRON, RETICCTPCT in the last 72 hours. Urinalysis    Component Value Date/Time   COLORURINE YELLOW 08/28/2018 1535   APPEARANCEUR CLEAR 08/28/2018 1535   APPEARANCEUR Clear 12/02/2015 1621   LABSPEC 1.030 08/28/2018 1535   PHURINE 6.0 08/28/2018 1535   GLUCOSEU >=500 (A) 08/28/2018 1535   HGBUR NEGATIVE 08/28/2018 1535   BILIRUBINUR NEGATIVE 08/28/2018 1535   BILIRUBINUR Negative 12/02/2015 1621   KETONESUR 20 (A) 08/28/2018 1535   PROTEINUR NEGATIVE 08/28/2018 1535   UROBILINOGEN 0.2 08/21/2012 1549   NITRITE NEGATIVE 08/28/2018 1535   LEUKOCYTESUR NEGATIVE 08/28/2018 1535   Sepsis Labs Invalid input(s): PROCALCITONIN,  WBC,  LACTICIDVEN Microbiology Recent Results (from the past 240 hour(s))  Blood culture (routine x 2)     Status: None (Preliminary result)   Collection Time: 08/28/18  6:24 PM   Specimen: BLOOD  Result Value Ref Range Status   Specimen Description BLOOD RIGHT ANTECUBITAL  Final   Special Requests   Final    BOTTLES DRAWN  AEROBIC AND ANAEROBIC Blood Culture adequate volume   Culture   Final    NO GROWTH 2 DAYS Performed at [facility], [address], [place], [place] [address]    Report Status PENDING  Incomplete  SARS Coronavirus 2 (CEPHEID - Performed in [place] hospital lab), Hosp Order     Status: None   Collection Time: 08/28/18  6:30 PM   Specimen: Nasopharyngeal Swab  Result Value Ref Range Status   SARS Coronavirus 2 NEGATIVE NEGATIVE Final    Comment: (NOTE) If result is NEGATIVE SARS-CoV-2 target nucleic acids are  NOT DETECTED. The SARS-CoV-2 RNA is generally detectable in upper and lower  respiratory specimens during the acute phase of infection. The lowest  concentration of SARS-CoV-2 viral copies this assay can detect is 250  copies / mL. A negative result does not preclude SARS-CoV-2 infection  and should not be used as the sole basis for treatment or other  patient management decisions.  A negative result may occur with  improper specimen collection / handling, submission of specimen other  than nasopharyngeal swab, presence of viral mutation(s) within the  areas targeted by this assay, and inadequate number of viral copies  (<250 copies / mL). A negative result must be combined with clinical  observations, patient history, and epidemiological information. If result is POSITIVE SARS-CoV-2 target nucleic acids are DETECTED. The SARS-CoV-2 RNA is generally detectable in upper and lower  respiratory specimens dur ing the acute phase of infection.  Positive  results are indicative of active infection with SARS-CoV-2.  Clinical  correlation with patient history and other diagnostic information is  necessary to determine patient infection status.  Positive results do  not rule out bacterial infection or co-infection with other viruses. If result is PRESUMPTIVE POSTIVE SARS-CoV-2 nucleic acids MAY BE PRESENT.   A presumptive positive result was obtained on the submitted specimen  and confirmed on repeat testing.  While 2019 novel coronavirus  (SARS-CoV-2) nucleic acids may be present in the submitted sample  additional confirmatory testing may be necessary for epidemiological  and / or clinical management purposes  to differentiate between  SARS-CoV-2 and other Sarbecovirus currently known to infect humans.  If clinically indicated additional testing with an alternate test  methodology [phone]) is advised. The SARS-CoV-2 RNA is generally  detectable in upper and lower respiratory sp ecimens  during the acute  phase of infection. The expected result is Negative. Fact Sheet for Patients:  StrictlyIdeas.no Fact Sheet for Healthcare Providers: BankingDealers.co.za This test is not yet approved or cleared by the [place] FDA and has been authorized for detection and/or diagnosis of SARS-CoV-2 by FDA under an Emergency Use Authorization (EUA).  This EUA will remain in effect (meaning this test can be used) for the duration of the COVID-19 declaration under Section 564(b)(1) of the Act, 21 U.S.C. section 360bbb-3(b)(1), unless the authorization is terminated or revoked sooner. Performed at [facility], [address] [place], [place] [address]   Blood culture (routine x 2)     Status: None (Preliminary result)   Collection Time: 08/28/18  6:30 PM   Specimen: BLOOD  Result Value Ref Range Status   Specimen Description BLOOD LEFT ANTECUBITAL  Final   Special Requests   Final    BOTTLES DRAWN AEROBIC AND ANAEROBIC Blood Culture adequate volume   Culture   Final    NO GROWTH 2 DAYS Performed at [facility], [address], [place],  [address]    Report Status PENDING  Incomplete  Aerobic Culture (superficial specimen)     Status: None (Preliminary result)   Collection Time: 08/29/18  8:15 AM   Specimen: Buttocks  Result Value Ref Range Status   Specimen Description   Final    BUTTOCKS Performed at [facility], [address], [place], [place] [address]    Special Requests   Final    Immunocompromised Performed at [facility], [address], [place], [place] [address]    Gram Stain   Final    MODERATE WBC PRESENT, PREDOMINANTLY PMN FEW GRAM POSITIVE COCCI RARE GRAM POSITIVE RODS    Culture   Final    NO GROWTH < 24 HOURS Performed at [facility], [place] [address], [place], [place] [address]    Report Status PENDING  Incomplete     Time coordinating discharge: 35 minutes  SIGNED:   Rodena Goldmann, DO [facility] 08/30/2018, 1:03 PM  If 7PM-7AM, please contact night-coverage [email or web address] Password [email or web address]

## 2018-08-30 NOTE — Care Management Important Message (Signed)
Important Message  Patient Details  Name: Matthew [name] MRN: [id number] Date of Birth: 1960-04-25   Medicare Important Message Given:  Yes     Tommy Medal 08/30/2018, 12:08 PM

## 2018-08-31 LAB — AEROBIC CULTURE W GRAM STAIN (SUPERFICIAL SPECIMEN)

## 2018-09-02 ENCOUNTER — Encounter: Payer: Self-pay | Admitting: *Deleted

## 2018-09-02 ENCOUNTER — Other Ambulatory Visit: Payer: Self-pay | Admitting: *Deleted

## 2018-09-02 ENCOUNTER — Other Ambulatory Visit: Payer: Self-pay | Admitting: Family

## 2018-09-02 DIAGNOSIS — G500 Trigeminal neuralgia: Secondary | ICD-10-CM

## 2018-09-02 LAB — CULTURE, BLOOD (ROUTINE X 2)
Culture: NO GROWTH
Culture: NO GROWTH
Special Requests: ADEQUATE
Special Requests: ADEQUATE

## 2018-09-02 NOTE — Patient Outreach (Addendum)
Referral received from inpatient RN, pt hospitalized 7/15-7/17/20 with hyperglycemia, new diagnosis DM with AIC over 13, perirectal abcess with I & D (now on antibiotic), HTN, COPD, unable to read or write.  Primary MD completes transition of care,  Outreach call to pt for screening, spoke with pt, HIPAA verified, pt reports he was evicted and is now living at camp ground in [facility], receives assistance of his girlfriend and her family, he has been staying at their house at night due to the heat and stays at the camp ground during the day and has air conditioning in his tent, pt states he loves living in [facility] and will be moving into a house within the next month, pt states he still drives and has a scooter and is able to get to his appointments, patient's son [name] lives in [place] and comes to visit pt and pt feels he has adequate assistance with friends and other people, pt states he color codes his medications due to unable to read or write, pt reports he "really wants to try and do what's right with this diabetes, I really am trying"  Pt states he has prescription for glucometer and "I just haven't gone to get it but I plan to in the next day or two"  Pt rides a scooter and friends transport also.  Pt states someone has been using their glucometer and checking his CBG which was around 200.  Pt does not feel he needs social work services at present as he already has somewhere lined up to live and is managing well where he is at.  Pt states if anything changes, he will let RN CM know.  RN CM reviewed plan of care and importance of getting the glucometer and starting to check CBG  (pt states he has family / friends that will show him how to use the glucometer) and will help with any materials RN CM sends in the mail (per pt, send to old address as his mail is still being picked up there)   RN CM reviewed medications over the phone, pt reports he has everything and taking as prescribed.  RN CM faxed  today's visit note and barrier letter to primary MD.  Outpatient Encounter Medications as of 09/02/2018  Medication Sig  . albuterol (VENTOLIN HFA) 108 (90 Base) MCG/ACT inhaler INHALE TWO PUFFS INTO LUNGS EVERY SIX HOURS AS NEEDED FOR WHEEZE OR SHORTNESS OF BREATH. (Patient taking differently: Inhale 2 puffs into the lungs every 6 (six) hours as needed for wheezing or shortness of breath. )  . amLODipine (NORVASC) 10 MG tablet Take 10 mg by mouth daily.  Marland Kitchen apixaban (ELIQUIS) 5 MG TABS tablet Take 1 tablet (5 mg total) by mouth 2 (two) times daily.  Marland Kitchen aspirin EC 81 MG tablet Take 1 tablet (81 mg total) by mouth daily.  Marland Kitchen atorvastatin (LIPITOR) 40 MG tablet Take 1 tablet (40 mg total) by mouth daily at 6 PM.  . B Complex-C (B-COMPLEX WITH VITAMIN C) tablet Take 1 tablet by mouth daily.  . baclofen (LIORESAL) 10 MG tablet Take 1 tablet (10 mg total) by mouth 3 (three) times daily.  . carvedilol (COREG) 3.125 MG tablet TAKE 1 TABLET (3.125 MG TOTAL) BY MOUTH 2 (TWO) TIMES DAILY WITH A MEAL.  . ciprofloxacin (CIPRO) 500 MG tablet Take 1 tablet (500 mg total) by mouth 2 (two) times daily for 5 days.  . DULoxetine (CYMBALTA) 30 MG capsule Take 1 capsule (30 mg total) by  mouth daily.  . fluticasone (FLONASE) 50 MCG/ACT nasal spray Place 2 sprays into both nostrils daily.   . Fluticasone-Umeclidin-Vilant (TRELEGY ELLIPTA) 100-62.5-25 MCG/INH AEPB Inhale 1 puff into the lungs daily.  Marland Kitchen gabapentin (NEURONTIN) 600 MG tablet TAKE 1 TABLET IN THE MORNING, 1 TABLET IN THE AFTERNOON, AND 1.5 TABLET AT BEDTIME AS DIRECTED (Patient taking differently: Take 600-900 mg by mouth See admin instructions. Take '600mg'$  in the morning, '600mg'$  in the afternoon, and take '900mg'$  at bedtime)  . guaiFENesin (MUCINEX) 600 MG 12 hr tablet Take 1 tablet (600 mg total) by mouth 2 (two) times daily.  Marland Kitchen levETIRAcetam (KEPPRA) 500 MG tablet Take 1 tablet (500 mg total) by mouth 2 (two) times daily.  Marland Kitchen loratadine (CLARITIN) 10 MG tablet  Take 1 tablet (10 mg total) by mouth daily.  . metFORMIN (GLUCOPHAGE) 500 MG tablet Take 1 tablet (500 mg total) by mouth 2 (two) times daily with a meal.  . metroNIDAZOLE (FLAGYL) 500 MG tablet Take 1 tablet (500 mg total) by mouth 3 (three) times daily for 5 days.  . blood glucose meter kit and supplies Dispense based on patient and insurance preference. Use up to four times daily as directed. (FOR ICD-10 E10.9, E11.9). (Patient not taking: Reported on 09/02/2018)  . lisinopril (PRINIVIL,ZESTRIL) 5 MG tablet Take 1 tablet (5 mg total) by mouth daily for 30 days.   No facility-administered encounter medications on file as of 09/02/2018.     THN CM Care Plan Problem One     Most Recent Value  Care Plan Problem One  Knowledge deficit related to diabetes  Role Documenting the Problem One  Care Management Coordinator  Care Plan for Problem One  Active  THN Long Term Goal   Pt will show improvement in self care for diabetes aeb decrease in [facility] of 1-2 points within 60 days  THN Long Term Goal Start Date  09/02/18  Interventions for Problem One Long Term Goal  RN CM established plan of care with pt, mailed successful outreach letter to patient's old address (per pt request) with 24 hour nurse line magnet, pamphlet, diabetes booklet  THN CM Short Term Goal #1   Pt will obtain glucometer from pharmacy within one week  THN CM Short Term Goal #1 Start Date  09/02/18  Interventions for Short Term Goal #1  pt has prescription for glucometer, RN CM ask pt to get glucometer at pharmacy as soon as possible and begin checking CBG  THN CM Short Term Goal #2   Pt will verbalize foods high in carbohydrates and be modify carbohydrate intake within 30 days  THN CM Short Term Goal #2 Start Date  09/02/18  Interventions for Short Term Goal #2  RN CM reviewed foods that will elevate CBG such as pastas, rice, bread and importance of being aware of portion sizes and limiting these to no more than 4-5 servings per meal, RN  CM reviewed foods to avoid or limit such as sugary beverages, cakes, candy.     PLAN Outreach pt within one week to ensure pt got glucometer  Jacqlyn Larsen [facility], [place] Coordinator [phone]

## 2018-09-04 ENCOUNTER — Other Ambulatory Visit: Payer: Self-pay

## 2018-09-05 ENCOUNTER — Encounter: Payer: Self-pay | Admitting: Family

## 2018-09-05 ENCOUNTER — Ambulatory Visit (INDEPENDENT_AMBULATORY_CARE_PROVIDER_SITE_OTHER): Payer: Medicare Other | Admitting: Family

## 2018-09-05 VITALS — BP 120/73 | HR 67 | Temp 98.2°F | Ht 69.0 in | Wt 223.4 lb

## 2018-09-05 DIAGNOSIS — E1165 Type 2 diabetes mellitus with hyperglycemia: Secondary | ICD-10-CM

## 2018-09-05 DIAGNOSIS — Z720 Tobacco use: Secondary | ICD-10-CM | POA: Diagnosis not present

## 2018-09-05 DIAGNOSIS — Z09 Encounter for follow-up examination after completed treatment for conditions other than malignant neoplasm: Secondary | ICD-10-CM | POA: Diagnosis not present

## 2018-09-05 DIAGNOSIS — K611 Rectal abscess: Secondary | ICD-10-CM | POA: Diagnosis not present

## 2018-09-05 DIAGNOSIS — I10 Essential (primary) hypertension: Secondary | ICD-10-CM | POA: Diagnosis not present

## 2018-09-05 DIAGNOSIS — R531 Weakness: Secondary | ICD-10-CM | POA: Diagnosis not present

## 2018-09-05 DIAGNOSIS — J438 Other emphysema: Secondary | ICD-10-CM | POA: Diagnosis not present

## 2018-09-05 DIAGNOSIS — E119 Type 2 diabetes mellitus without complications: Secondary | ICD-10-CM | POA: Insufficient documentation

## 2018-09-05 DIAGNOSIS — Z550 Illiteracy and low-level literacy: Secondary | ICD-10-CM

## 2018-09-05 LAB — GLUCOSE HEMOCUE WAIVED: Glu Hemocue Waived: 299 mg/dL — ABNORMAL HIGH (ref 65–99)

## 2018-09-05 MED ORDER — METFORMIN HCL 1000 MG PO TABS: 1000.0000 mg | ORAL_TABLET | Freq: Two times a day (BID) | ORAL | 3 refills | Status: DC

## 2018-09-05 MED ORDER — SITAGLIPTIN PHOSPHATE 50 MG PO TABS: 50.0000 mg | ORAL_TABLET | Freq: Every day | ORAL | 1 refills | Status: DC

## 2018-09-05 NOTE — Progress Notes (Signed)
Subjective:    Patient ID: Matthew [name], male    DOB: Aug 02, 1960, 58 y.o.   MRN: [id number]  Chief Complaint  Patient presents with  . Hospitalization Follow-up   Pt presents to the office today for hospital follow up. He went to the ED on 08/28/18 with hyperglycemia of 858 and sodium of 120. He also had perirectal abscess and had I&D and was given vancomycin and cefepime and discharge on oral cipro and Flagyl. States this area is improved and doing well.   PT states he has taken his metformin every day, but has not been able to pick up his glucose meter because he has been moving.   He is followed by [facility] and has talked to the diabetic educator. Pt can not read and write.  Diabetes He presents for his initial diabetic visit. He has type 2 diabetes mellitus. There are no hypoglycemic associated symptoms. Associated symptoms include foot paresthesias and visual change. Pertinent negatives for diabetes include no blurred vision (improved). Hypoglycemia complications include hospitalization. Symptoms are worsening. Diabetic complications include heart disease and peripheral neuropathy. Risk factors for coronary artery disease include diabetes mellitus, dyslipidemia, hypertension and sedentary lifestyle. His weight is stable. He is following a generally unhealthy diet. Home blood sugar record trend: does not check BS at home. An ACE inhibitor/angiotensin II receptor blocker is being taken. Eye exam is not current.      Review of Systems  Eyes: Negative for blurred vision (improved).  Skin: Positive for wound.  All other systems reviewed and are negative.      Objective:   Physical Exam Vitals signs reviewed.  Constitutional:      General: He is not in acute distress.    Appearance: He is well-developed.  HENT:     Head: Normocephalic.     Right Ear: Tympanic membrane normal.     Left Ear: Tympanic membrane normal.  Eyes:     General:        Right eye: No discharge.        Left eye:  No discharge.     Pupils: Pupils are equal, round, and reactive to light.  Neck:     Musculoskeletal: Normal range of motion and neck supple.     Thyroid: No thyromegaly.  Cardiovascular:     Rate and Rhythm: Normal rate and regular rhythm.     Heart sounds: Normal heart sounds. No murmur.  Pulmonary:     Effort: Pulmonary effort is normal. No respiratory distress.     Breath sounds: Normal breath sounds. No wheezing.  Abdominal:     General: Bowel sounds are normal. There is no distension.     Palpations: Abdomen is soft.     Tenderness: There is no abdominal tenderness.  Genitourinary:      Comments: Abscess looks great, no redness, tenderness present. Area still opened, small hard area underneath.  Musculoskeletal: Normal range of motion.        General: No tenderness.  Skin:    General: Skin is warm and dry.     Findings: No erythema or rash.  Neurological:     Mental Status: He is alert and oriented to person, place, and time.     Cranial Nerves: No cranial nerve deficit.     Deep Tendon Reflexes: Reflexes are normal and symmetric.  Psychiatric:        Behavior: Behavior normal.        Thought Content: Thought content normal.  Judgment: Judgment normal.       BP 120/73   Pulse 67   Temp 98.2 F (36.8 C) (Oral)   Ht '5\' 9"'$  (1.753 m)   Wt 223 lb 6.4 oz (101.3 kg)   BMI 32.99 kg/m      Assessment & Plan:  Matthew [name] comes in today with chief complaint of Hospitalization Follow-up   Diagnosis and orders addressed:  1. Essential hypertension - CMP14+EGFR - CBC with Differential/Platelet  2. Weakness - CMP14+EGFR - CBC with Differential/Platelet  3. Unable to read or write - CMP14+EGFR - CBC with Differential/Platelet  4. Perirectal abscess Area looks great, continue cipro and flagyl  - CMP14+EGFR - CBC with Differential/Platelet  5. Tobacco use - CMP14+EGFR - CBC with Differential/Platelet  6. Type 2 diabetes mellitus with  hyperglycemia, without long-term current use of insulin (HCC) Will increase metformin to 1000 mg from 500 mg and add Januvia 50 mg Strict low carb diet Will do referral to Diabetic educator Long discussion with patient how to take medication  - Glucose Hemocue Waived - CMP14+EGFR - CBC with Differential/Platelet - Referral to Nutrition and Diabetes Services - metFORMIN (GLUCOPHAGE) 1000 MG tablet; Take 1 tablet (1,000 mg total) by mouth 2 (two) times daily with a meal.  Dispense: 180 tablet; Refill: 3 - sitaGLIPtin (JANUVIA) 50 MG tablet; Take 1 tablet (50 mg total) by mouth daily.  Dispense: 90 tablet; Refill: 1  7. Other emphysema ([place])  8. Hospital discharge follow-up Hospital notes reviewed    Labs pending Health Maintenance reviewed Diet and exercise encouraged  Follow up plan: 2 weeks to recheck   Evelina Dun, FNP

## 2018-09-05 NOTE — Patient Instructions (Signed)

## 2018-09-06 LAB — CBC WITH DIFFERENTIAL/PLATELET
Basophils Absolute: 0.1 10*3/uL (ref 0.0–0.2)
Basos: 1 %
EOS (ABSOLUTE): 0.2 10*3/uL (ref 0.0–0.4)
Eos: 2 %
Hematocrit: 39.8 % (ref 37.5–51.0)
Hemoglobin: 13.4 g/dL (ref 13.0–17.7)
Immature Grans (Abs): 0.1 10*3/uL (ref 0.0–0.1)
Immature Granulocytes: 1 %
Lymphocytes Absolute: 2.3 10*3/uL (ref 0.7–3.1)
Lymphs: 24 %
MCH: 29.6 pg (ref 26.6–33.0)
MCHC: 33.7 g/dL (ref 31.5–35.7)
MCV: 88 fL (ref 79–97)
Monocytes Absolute: 0.6 10*3/uL (ref 0.1–0.9)
Monocytes: 6 %
Neutrophils Absolute: 6.2 10*3/uL (ref 1.4–7.0)
Neutrophils: 66 %
Platelets: 288 10*3/uL (ref 150–450)
RBC: 4.52 x10E6/uL (ref 4.14–5.80)
RDW: 12.3 % (ref 11.6–15.4)
WBC: 9.3 10*3/uL (ref 3.4–10.8)

## 2018-09-06 LAB — CMP14+EGFR
ALT: 58 IU/L — ABNORMAL HIGH (ref 0–44)
AST: 50 IU/L — ABNORMAL HIGH (ref 0–40)
Albumin/Globulin Ratio: 1.4 (ref 1.2–2.2)
Albumin: 3.8 g/dL (ref 3.8–4.9)
Alkaline Phosphatase: 85 IU/L (ref 39–117)
BUN/Creatinine Ratio: 8 — ABNORMAL LOW (ref 9–20)
BUN: 6 mg/dL (ref 6–24)
Bilirubin Total: <0.2 mg/dL (ref 0.0–1.2)
CO2: 22 mmol/L (ref 20–29)
Calcium: 8.9 mg/dL (ref 8.7–10.2)
Chloride: 101 mmol/L (ref 96–106)
Creatinine, Ser: 0.72 mg/dL — ABNORMAL LOW (ref 0.76–1.27)
GFR calc Af Amer: 118 mL/min/{1.73_m2} (ref >59)
GFR calc non Af Amer: 102 mL/min/{1.73_m2} (ref >59)
Globulin, Total: 2.7 g/dL (ref 1.5–4.5)
Glucose: 267 mg/dL — ABNORMAL HIGH (ref 65–99)
Potassium: 4.4 mmol/L (ref 3.5–5.2)
Sodium: 138 mmol/L (ref 134–144)
Total Protein: 6.5 g/dL (ref 6.0–8.5)

## 2018-09-09 ENCOUNTER — Other Ambulatory Visit: Payer: Self-pay | Admitting: *Deleted

## 2018-09-09 NOTE — Patient Outreach (Addendum)
Outreach call to pt for follow up on obtaining glucometer.  Pt states he does have prescription but still has not gotten the glucometer citing " I don't get paid until the third of August and can't get anything until then"  Pt states he checked blood sugar " a few days ago and it was 297"  Pt states a friend let him use his glucometer.  RN CM placed order for pharmacist for resources/ assistance for the glucometer  [facility] CM Care Plan Problem One     Most Recent Value  Care Plan Problem One  Knowledge deficit related to diabetes  Role Documenting the Problem One  Care Management [place] for Problem One  Active  THN Long Term Goal   Pt will show improvement in self care for diabetes aeb decrease in [facility] of 1-2 points within 60 days  THN Long Term Goal Start Date  09/02/18  Interventions for Problem One Long Term Goal  RN CM reinforced plan of care with pt,  RN CM placed order for [facility] pharmacist due to pt does not have his glucometer yet and states he cannot afford., pt has polypharmacy and can benefit from oversight of pharmacist due to illiteracy.  THN CM Short Term Goal #1   Pt will obtain glucometer from pharmacy within one week  THN CM Short Term Goal #1 Start Date  09/02/18  THN CM Short Term Goal #2   Pt will verbalize foods high in carbohydrates and be modify carbohydrate intake within 30 days  THN CM Short Term Goal #2 Start Date  09/02/18    .    PLAN Order placed for [facility] pharmacist for assistance with obtaining glucometer Follow up with pt within the next week Collaborate with [facility] pharmacist as needed  Jacqlyn Larsen [facility], [place] Coordinator [phone]

## 2018-09-10 ENCOUNTER — Other Ambulatory Visit: Payer: Self-pay

## 2018-09-10 ENCOUNTER — Other Ambulatory Visit: Payer: Medicare Other

## 2018-09-13 ENCOUNTER — Other Ambulatory Visit: Payer: Self-pay | Admitting: Pharmacist

## 2018-09-13 NOTE — Patient Outreach (Signed)
[facility]) Care Management  [place]   09/13/2018  Matthew [name] 11-Feb-1961 [id number]  Reason for referral: Medication Management  Referral source: [facility] RN Current insurance: Medicaid / Medicare Advantage ([facility])  PMHx includes but not limited to:  HTN, HLD, poor literacy (unable to read/write), tobacco abuse, COPD, PAF on chronic anticoagulation, obesity, with recent hospitalization for perirectal abscess s/p I/D and new onset diabetes with A1C > 13.    Outreach:  Successful telephone call with Matthew [name].  HIPAA identifiers verified.   Subjective:  Patient reports his son helps him manage his medications because he can't read or write.  He states his son uses a color system to let him know to take medication once or twice daily.  He reports he is supposed to be checking his blood sugars but he hasn't been able to pick up his new machine and supplies yet at the pharmacy.  He states he has Medicaid / Medicare so co-pays are usually $1 -3 at most.  He has transportation to pharmacy and provider via a scooter.  He states his son also assists him with picking up medications.  Patient may also have issues with vision as he stated several times he could not see well during the conversation.    [facility] [place] [facility] on diabetic testing supplies.  -Pharmacy needs updated Medicare Part B card on file to bill.   -Patient does not have new Medicare Part B card, only the old one with SS number -Able to acquire updated card information from PCP office.   -[facility] for One Touch Ultra 2 glucometer, test strips, and lancets.  Cost this month will be ~$10, moving forward will be ~$4 / month for only test strips and lancets.  -Patient reports he is able to afford this.  He will pick up later today and bring all supplies to PCP office for training on how to use.    Call placed to patient's son, Matthew [name].  Son is driving to see his  father today.  He will try to call me when he is at the home to review medications.  He states his father had another person helping him with medications last month but this did not work out so he is hopefully going to start helping him again with the color-coded system.  We discussed risk of anticoagulation not being taken appropriately and son voiced understanding.  He states his father has tried pillboxes in the past and did not like this.  Patient prefers using the color coded system they have in placed.     Objective: The ASCVD Risk score Matthew [name]., et al., 2013) failed to calculate for the following reasons:   The patient has a prior MI or stroke diagnosis  Lab Results  Component Value Date   CREATININE 0.72 (L) 09/05/2018   CREATININE 0.63 08/30/2018   CREATININE 0.59 (L) 08/29/2018    Lab Results  Component Value Date   HGBA1C 13.0 (H) 08/28/2018    Lipid Panel     Component Value Date/Time   CHOL 120 08/26/2018 1205   TRIG 204 (H) 08/26/2018 1205   HDL 42 08/26/2018 1205   CHOLHDL 2.9 08/26/2018 1205   CHOLHDL 3.6 10/23/2017 0513   VLDL 21 10/23/2017 0513   LDLCALC 37 08/26/2018 1205    BP Readings from Last 3 Encounters:  09/05/18 120/73  08/30/18 (!) 155/85  08/26/18 (!) 89/50  Allergies  Allergen Reactions  . Penicillins Other (See Comments)    Did it involve swelling of the face/tongue/throat, SOB, or low BP? Unknown Did it involve sudden or severe rash/hives, skin peeling, or any reaction on the inside of your mouth or nose? Unknown Did you need to seek medical attention at a hospital or doctor's office? Unknown When did it last happen?childhood If all above answers are "NO", may proceed with cephalosporin use.     Medications Reviewed Today    Reviewed by [name], FNP (Family Nurse Practitioner) on 09/05/18 at 1234  Med List Status: <None>  Medication Order Taking? Sig Documenting Provider Last Dose Status Informant  albuterol (VENTOLIN  HFA) 108 (90 Base) MCG/ACT inhaler [id number] Yes INHALE TWO PUFFS INTO LUNGS EVERY SIX HOURS AS NEEDED FOR WHEEZE OR SHORTNESS OF BREATH.  Patient taking differently: Inhale 2 puffs into the lungs every 6 (six) hours as needed for wheezing or shortness of breath.    [name], FNP Taking Active Pharmacy Records  amLODipine (NORVASC) 10 MG tablet [id number] Yes Take 10 mg by mouth daily. [provider] Taking Active Pharmacy Records  apixaban (ELIQUIS) 5 MG TABS tablet [id number] Yes Take 1 tablet (5 mg total) by mouth 2 (two) times daily. [name], FNP Taking Active Pharmacy Records  aspirin EC 81 MG tablet [id number] Yes Take 1 tablet (81 mg total) by mouth daily. Manuella Ghazi, Pratik D, DO Taking Active   atorvastatin (LIPITOR) 40 MG tablet [id number] Yes Take 1 tablet (40 mg total) by mouth daily at 6 PM. [name], FNP Taking Active Pharmacy Records  B Complex-C (B-COMPLEX WITH VITAMIN C) tablet [id number] Yes Take 1 tablet by mouth daily. [name], FNP Taking Active Pharmacy Records  baclofen (LIORESAL) 10 MG tablet [id number] Yes Take 1 tablet (10 mg total) by mouth 3 (three) times daily. [name], FNP Taking Active Pharmacy Records  blood glucose meter kit and supplies [id number] Yes Dispense based on patient and insurance preference. Use up to four times daily as directed. (FOR ICD-10 E10.9, E11.9). Manuella Ghazi, Pratik D, DO Taking Active   carvedilol (COREG) 3.125 MG tablet [id number] Yes TAKE 1 TABLET (3.125 MG TOTAL) BY MOUTH 2 (TWO) TIMES DAILY WITH A MEAL. [name], FNP Taking Active Pharmacy Records  DULoxetine (CYMBALTA) 30 MG capsule [id number] Yes Take 1 capsule (30 mg total) by mouth daily. [name], FNP Taking Active Pharmacy Records  fluticasone [facility]) 50 MCG/ACT nasal spray [id number] Yes Place 2 sprays into both nostrils daily.  [provider] Taking Active Pharmacy Records  Fluticasone-Umeclidin-Vilant (TRELEGY ELLIPTA)  100-62.5-25 MCG/INH AEPB [id number] Yes Inhale 1 puff into the lungs daily. [name], FNP Taking Active Pharmacy Records  gabapentin (NEURONTIN) 600 MG tablet [id number] Yes TAKE 1 TABLET IN THE MORNING, 1 TABLET IN THE AFTERNOON, AND 1.5 TABLET AT BEDTIME AS DIRECTED Hawks, Christy A, FNP Taking Active   guaiFENesin (MUCINEX) 600 MG 12 hr tablet [id number] Yes Take 1 tablet (600 mg total) by mouth 2 (two) times daily. [name], FNP Taking Active Pharmacy Records  levETIRAcetam (KEPPRA) 500 MG tablet [id number] Yes Take 1 tablet (500 mg total) by mouth 2 (two) times daily. [name], FNP Taking Active Pharmacy Records  lisinopril (PRINIVIL,ZESTRIL) 5 MG tablet [id number]  Take 1 tablet (5 mg total) by mouth daily for 30 days. [name], [place]  Expired 08/28/18 2359 Pharmacy Records  Med Note Trinidad Curet, DANA K   Wed Aug 28, 2018  4:58 PM)    loratadine (CLARITIN) 10 MG tablet [id number] Yes Take 1 tablet (10 mg total) by mouth daily. Janora Norlander, DO Taking Active Pharmacy Records  metFORMIN (GLUCOPHAGE) 1000 MG tablet [id number]  Take 1 tablet (1,000 mg total) by mouth 2 (two) times daily with a meal. [name], FNP  Active           Assessment: Drugs sorted by system: -Unable to review medications with patient as he cannot see well today and cannot read labels.   -Son may be able to review medications with me later today or next week -No issues with cost of medications or transportation to pharmacy at this time -Patient with poor understanding of diabetes and would benefit from more support and education on this topic.   -Patient will pick-up glucometer and supplies today and will receive training from PCP office   Plan: . will f/u with patient and son again next week if I have not heard back from son for a medication review and update on CBG testing . Will contact [facility] RN to collaborate care  Ralene Bathe, PharmD, [place] [phone]

## 2018-09-17 ENCOUNTER — Other Ambulatory Visit: Payer: Self-pay | Admitting: *Deleted

## 2018-09-17 NOTE — Patient Outreach (Signed)
Outreach call to pt for follow up on obtaining glucometer, spoke with pt who verifies he did get the glucometer and took it by MD office and " they showed me how to use it"  Pt reports he has all medications and taking as prescribed, continues to use color coded system.  Pt states he has chosen not to record CBG at present " since I have trouble writing and it's stored in the meter"  Pt reports CBG over the past 2 days 156, 167.  Pt states vision " is a little better and now I'm wearing reading glasses, I'm going to schedule an eye exam"  Pt states he bought a large camper and will be moving to [facility] maybe within the next month or so and this will be his permanent home.  RN CM sent in basket to [facility] pharmacist with update reporting pt did obtain glucometer.  THN CM Care Plan Problem One     Most Recent Value  Care Plan Problem One  Knowledge deficit related to diabetes  Role Documenting the Problem One  Care Management Coordinator  Care Plan for Problem One  Active  THN Long Term Goal   Pt will show improvement in self care for diabetes aeb decrease in AIC of 1-2 points within 60 days  THN Long Term Goal Start Date  09/02/18  Interventions for Problem One Long Term Goal  RN CM reviewed plan of care with pt,  pt states he now has glucometer and checking CBG daily  THN CM Short Term Goal #1   Pt will obtain glucometer from pharmacy within one week  THN CM Short Term Goal #1 Start Date  09/02/18  [facility] CM Short Term Goal #1 Met Date  09/17/18  Interventions for Short Term Goal #1  RN CM assessed pt knowledge regarding use of glucometer, pt states MD office showed him how to use the machine and pt reports he has had no issues with checking CBG daily.  THN CM Short Term Goal #2   Pt will verbalize foods high in carbohydrates and be modify carbohydrate intake within 30 days  THN CM Short Term Goal #2 Start Date  09/02/18  Interventions for Short Term Goal #2  RN CM reviewed nutritious food choices,  asking pt to be mindful of carbohydrate intake    THN CM Care Plan Problem Two     Most Recent Value  Care Plan Problem Two  Recent issues with poor vision  Role Documenting the Problem Two  Care Management Coordinator  Care Plan for Problem Two  Active  THN CM Short Term Goal #1   Pt will call within one week and schedule appointment for eye exam  THN CM Short Term Goal #1 Start Date  09/17/18  Interventions for Short Term Goal #2   Pt states he intends to call next week and schedule eye exam,  RN CM encouraged pt to do so and reviewed correlation of diabetes and how it can affect eyes/ vision,  pt now has reading glasses and says this has helped greatly      PLAN Call pt later this month for telephone assessment  Jacqlyn Larsen [facility], [place] Coordinator [phone]

## 2018-09-19 ENCOUNTER — Other Ambulatory Visit: Payer: Self-pay | Admitting: Pharmacist

## 2018-09-19 ENCOUNTER — Ambulatory Visit: Payer: Self-pay | Admitting: Pharmacist

## 2018-09-19 NOTE — Patient Outreach (Signed)
[facility]) Care Management  [place] 09/19/2018  Matthew [name] 05-05-1960 [id number]  Reason for call: f/u on medications and glucometer  Communication received from [place] that patient successfully picked up glucometer and was taught how to use it by PCP office staff.    Outreach:  Unsuccessful telephone call attempt #1 to patient. HIPAA compliant voicemail left requesting a return call  Plan:  -I will make another outreach attempt to patient within 3-4 business days.    Ralene Bathe, PharmD, [place] [phone]    s

## 2018-09-24 ENCOUNTER — Other Ambulatory Visit: Payer: Self-pay

## 2018-09-24 ENCOUNTER — Ambulatory Visit (INDEPENDENT_AMBULATORY_CARE_PROVIDER_SITE_OTHER): Payer: Medicare Other | Admitting: Family

## 2018-09-24 ENCOUNTER — Ambulatory Visit: Payer: Medicare Other

## 2018-09-24 ENCOUNTER — Encounter: Payer: Self-pay | Admitting: Family

## 2018-09-24 VITALS — BP 118/80 | HR 74 | Temp 98.9°F | Ht 69.0 in | Wt 224.8 lb

## 2018-09-24 DIAGNOSIS — F17200 Nicotine dependence, unspecified, uncomplicated: Secondary | ICD-10-CM | POA: Diagnosis not present

## 2018-09-24 DIAGNOSIS — E1165 Type 2 diabetes mellitus with hyperglycemia: Secondary | ICD-10-CM | POA: Diagnosis not present

## 2018-09-24 LAB — BAYER DCA HB A1C WAIVED: HB A1C (BAYER DCA - WAIVED): 10.2 % — ABNORMAL HIGH (ref <7.0)

## 2018-09-24 NOTE — Progress Notes (Signed)
Subjective:    Patient ID: Matthew [name], male    DOB: 1960/08/16, 58 y.o.   MRN: [id number]  Chief Complaint  Patient presents with  . Diabetes    recheck   Pt presents to the office today to follow up on DM. Pt diagnosed with DM on 08/28/18 with glucose of 858. He can not read which makes his medications difficulty at times. He has [facility] nurse that is checking on him several times a week.   He did get his glucose meter this week and has been checking his BS and states it has been in the 140's in the evening.  Diabetes He presents for his follow-up diabetic visit. He has type 2 diabetes mellitus. His disease course has been stable. There are no hypoglycemic associated symptoms. Associated symptoms include visual change. Pertinent negatives for diabetes include no blurred vision and no foot paresthesias. There are no hypoglycemic complications. Symptoms are stable. Diabetic complications include peripheral neuropathy. Pertinent negatives for diabetic complications include no CVA or heart disease. Risk factors for coronary artery disease include dyslipidemia, diabetes mellitus, hypertension, sedentary lifestyle and male sex. He is following a generally unhealthy diet. His overall blood glucose range is 140-180 mg/dl. Eye exam is not current.      Review of Systems  Eyes: Negative for blurred vision.  All other systems reviewed and are negative.      Objective:   Physical Exam Vitals signs reviewed.  Constitutional:      General: He is not in acute distress.    Appearance: He is well-developed.  HENT:     Head: Normocephalic.     Right Ear: Tympanic membrane normal.     Left Ear: Tympanic membrane normal.  Eyes:     General:        Right eye: No discharge.        Left eye: No discharge.     Pupils: Pupils are equal, round, and reactive to light.  Neck:     Musculoskeletal: Normal range of motion and neck supple.     Thyroid: No thyromegaly.  Cardiovascular:     Rate and Rhythm:  Normal rate and regular rhythm.     Heart sounds: Normal heart sounds. No murmur.  Pulmonary:     Effort: Pulmonary effort is normal. No respiratory distress.     Breath sounds: Decreased breath sounds present. No wheezing.  Abdominal:     General: Bowel sounds are normal. There is no distension.     Palpations: Abdomen is soft.     Tenderness: There is no abdominal tenderness.  Musculoskeletal: Normal range of motion.        General: No tenderness.  Skin:    General: Skin is warm and dry.     Findings: Abrasion present. No erythema or rash.       Neurological:     Mental Status: He is alert and oriented to person, place, and time.     Cranial Nerves: No cranial nerve deficit.     Deep Tendon Reflexes: Reflexes are normal and symmetric.  Psychiatric:        Behavior: Behavior normal.        Thought Content: Thought content normal.        Judgment: Judgment normal.       BP 118/80   Pulse 74   Temp 98.9 F (37.2 C) (Oral)   Ht _0  (1.753 m)   Wt 224 lb 12.8 oz (102 kg)   BMI  33.20 kg/m      Assessment & Plan:  Matthew [name] comes in today with chief complaint of Diabetes (recheck)   Diagnosis and orders addressed:  1. Type 2 diabetes mellitus with hyperglycemia, without long-term current use of insulin (HCC) Labs pending Low carb diet Continue medications F/U in 1 month  - Bayer DCA Hb A1c Waived - CMP14+EGFR - Microalbumin / creatinine urine ratio  2. Current smoker Smoking cessation  - CMP14+EGFR   Evelina Dun, FNP

## 2018-09-24 NOTE — Patient Instructions (Signed)

## 2018-09-25 LAB — CMP14+EGFR
ALT: 25 IU/L (ref 0–44)
AST: 18 IU/L (ref 0–40)
Albumin/Globulin Ratio: 1.7 (ref 1.2–2.2)
Albumin: 4.3 g/dL (ref 3.8–4.9)
Alkaline Phosphatase: 79 IU/L (ref 39–117)
BUN/Creatinine Ratio: 12 (ref 9–20)
BUN: 9 mg/dL (ref 6–24)
Bilirubin Total: 0.3 mg/dL (ref 0.0–1.2)
CO2: 19 mmol/L — ABNORMAL LOW (ref 20–29)
Calcium: 8.8 mg/dL (ref 8.7–10.2)
Chloride: 104 mmol/L (ref 96–106)
Creatinine, Ser: 0.75 mg/dL — ABNORMAL LOW (ref 0.76–1.27)
GFR calc Af Amer: 116 mL/min/{1.73_m2} (ref >59)
GFR calc non Af Amer: 100 mL/min/{1.73_m2} (ref >59)
Globulin, Total: 2.5 g/dL (ref 1.5–4.5)
Glucose: 146 mg/dL — ABNORMAL HIGH (ref 65–99)
Potassium: 3.6 mmol/L (ref 3.5–5.2)
Sodium: 141 mmol/L (ref 134–144)
Total Protein: 6.8 g/dL (ref 6.0–8.5)

## 2018-09-26 ENCOUNTER — Other Ambulatory Visit: Payer: Self-pay | Admitting: Pharmacist

## 2018-09-26 ENCOUNTER — Ambulatory Visit: Payer: Self-pay | Admitting: Pharmacist

## 2018-09-26 NOTE — Patient Outreach (Signed)
[facility]) [place] 09/26/2018  ORLAN AVERSA [birth year] [id number]  Reason for call: f/u on medications and glucometer  Communication received from [place] that patient successfully picked up glucometer and was taught how to use it by PCP office staff.  Per notes, patient continues to use glucometer and states CBGs in 140s in the evenings.   Outreach:  Unsuccessful telephone call attempt #2 to patient. HIPAA compliant voicemail left requesting a return call  Plan:  -I will make another outreach attempt to patient within 3-4 business days.    Ralene Bathe, PharmD, [place], PharmD, [place] [phone]

## 2018-10-01 ENCOUNTER — Ambulatory Visit: Payer: Self-pay | Admitting: Pharmacist

## 2018-10-01 ENCOUNTER — Other Ambulatory Visit: Payer: Self-pay | Admitting: Pharmacist

## 2018-10-01 NOTE — Patient Outreach (Signed)
[facility]) [place] 10/01/2018  Matthew [name] [birth year] [id number]  Reason for call: f/u on medication and glucometer  Successful call with [name].  Patient reports he is using his glucometer daily and numbers are "never over 200."  He reports they are usually between "100-140s."  He reports he understands how to use it and has not had any issues with it.  He also denies having any issues with medications.  He states he hopes to move next month and will let [facility] RN know his updated address after he moves.  No further questions or concerns at this time.   [facility] pharmacy case is being closed due to the following reasons: -Goals of care have been met. -I have provided my contact information if patient or family needs to reach out to me in the future.  -Thank you for allowing [facility] pharmacy to be involved in this patient's care.    Ralene Bathe, PharmD, [place] [phone]

## 2018-10-08 ENCOUNTER — Encounter: Payer: Self-pay | Admitting: *Deleted

## 2018-10-08 ENCOUNTER — Other Ambulatory Visit: Payer: Self-pay | Admitting: *Deleted

## 2018-10-08 NOTE — Patient Outreach (Signed)
Outreach call to pt for telephone assessment, spoke with pt, HIPAA verified,  Pt reports he will be moving around 10/19/18 to the campground in [facility], he has fully furnished camper, pt states he is no longer living in the tent at [place] but is staying with a friend in [place].  Pt reports he checks CBG BID with reading today of 98, yesterday 101 and no readings higher than 125.  Pt reports "vision back to normal"  Pt reports he feels better since blood sugar is improved, reports has all medications and takes as prescribed.  RN CM reinforced diet and importance of preventive measures such as eye exam, checking feet daily.    THN CM Care Plan Problem One     Most Recent Value  Care Plan Problem One  Knowledge deficit related to diabetes  Role Documenting the Problem One  Care Management [place] for Problem One  Active  THN Long Term Goal   Pt will show improvement in self care for diabetes aeb decrease in AIC of 1-2 points within 60 days  THN Long Term Goal Start Date  09/02/18  Interventions for Problem One Long Term Goal  RN CM reinforced plan of care with pt, continues to progress through plan of care, checking CBG BID, working towards being proactive with his health  THN CM Short Term Goal #1   Pt will obtain glucometer from pharmacy within one week  THN CM Short Term Goal #1 Start Date  09/02/18  [facility] CM Short Term Goal #1 Met Date  09/17/18  THN CM Short Term Goal #2   Pt will verbalize foods high in carbohydrates and be modify carbohydrate intake within 30 days  THN CM Short Term Goal #2 Start Date  10/08/18 [goal re-established]  Interventions for Short Term Goal #2  RN CM reinforced carbohydrate modified diet, foods high in carbohydrates/ sugar to limit.    THN CM Care Plan Problem Two     Most Recent Value  Care Plan Problem Two  Recent issues with poor vision  Role Documenting the Problem Two  Care Management Coordinator  Care Plan for Problem Two  Active  THN CM  Short Term Goal #1   Pt will call within one week and schedule appointment for eye exam  THN CM Short Term Goal #1 Start Date  09/17/18  [facility] CM Short Term Goal #1 Met Date   10/08/18  Interventions for Short Term Goal #2   Pt reports vision "back to normal, much improved" and has decided to wait on making eye exam appointment,   RN CM encouraged pt to make the appointment anyway and have an exam at least yearly.      PLAN Outreach pt next month for telephone assessment Reinforce diet Ask if pt moved into camper  Jacqlyn Larsen [facility], [place] Coordinator [phone]

## 2018-10-15 ENCOUNTER — Other Ambulatory Visit: Payer: Self-pay | Admitting: Family

## 2018-10-15 DIAGNOSIS — E785 Hyperlipidemia, unspecified: Secondary | ICD-10-CM

## 2018-10-22 ENCOUNTER — Other Ambulatory Visit: Payer: Self-pay | Admitting: Family

## 2018-10-22 DIAGNOSIS — G500 Trigeminal neuralgia: Secondary | ICD-10-CM

## 2018-10-22 DIAGNOSIS — F331 Major depressive disorder, recurrent, moderate: Secondary | ICD-10-CM

## 2018-10-22 DIAGNOSIS — I4891 Unspecified atrial fibrillation: Secondary | ICD-10-CM

## 2018-10-22 DIAGNOSIS — F4323 Adjustment disorder with mixed anxiety and depressed mood: Secondary | ICD-10-CM

## 2018-10-25 ENCOUNTER — Ambulatory Visit (INDEPENDENT_AMBULATORY_CARE_PROVIDER_SITE_OTHER): Payer: Medicare Other | Admitting: Family

## 2018-10-25 ENCOUNTER — Other Ambulatory Visit: Payer: Self-pay

## 2018-10-25 ENCOUNTER — Encounter: Payer: Self-pay | Admitting: Family

## 2018-10-25 VITALS — BP 131/81 | HR 61 | Temp 97.3°F | Resp 16 | Ht 69.0 in | Wt 221.0 lb

## 2018-10-25 DIAGNOSIS — Z23 Encounter for immunization: Secondary | ICD-10-CM

## 2018-10-25 DIAGNOSIS — I10 Essential (primary) hypertension: Secondary | ICD-10-CM | POA: Diagnosis not present

## 2018-10-25 DIAGNOSIS — L0291 Cutaneous abscess, unspecified: Secondary | ICD-10-CM

## 2018-10-25 DIAGNOSIS — E669 Obesity, unspecified: Secondary | ICD-10-CM

## 2018-10-25 DIAGNOSIS — Z720 Tobacco use: Secondary | ICD-10-CM | POA: Diagnosis not present

## 2018-10-25 DIAGNOSIS — E1165 Type 2 diabetes mellitus with hyperglycemia: Secondary | ICD-10-CM | POA: Diagnosis not present

## 2018-10-25 DIAGNOSIS — F331 Major depressive disorder, recurrent, moderate: Secondary | ICD-10-CM

## 2018-10-25 LAB — BAYER DCA HB A1C WAIVED: HB A1C (BAYER DCA - WAIVED): 7.1 % — ABNORMAL HIGH (ref <7.0)

## 2018-10-25 MED ORDER — SULFAMETHOXAZOLE-TRIMETHOPRIM 800-160 MG PO TABS: 1.0000 | ORAL_TABLET | Freq: Two times a day (BID) | ORAL | 0 refills | Status: DC

## 2018-10-25 MED ORDER — GLUCOSE BLOOD VI STRP: ORAL_STRIP | 12 refills | Status: DC

## 2018-10-25 MED ORDER — LISINOPRIL 5 MG PO TABS: 5.0000 mg | ORAL_TABLET | Freq: Every day | ORAL | 1 refills | Status: DC

## 2018-10-25 NOTE — Progress Notes (Signed)
Subjective:    Patient ID: Matthew [name], male    DOB: November 28, 1960, 58 y.o.   MRN: [id number]  Chief Complaint  Patient presents with  . Diabetes   Pt presents to the office today for chronic follow up.  Diabetes He presents for his follow-up diabetic visit. He has type 2 diabetes mellitus. His disease course has been stable. There are no hypoglycemic associated symptoms. Pertinent negatives for diabetes include no foot paresthesias and no visual change. There are no hypoglycemic complications. Symptoms are stable. Diabetic complications include heart disease. Risk factors for coronary artery disease include dyslipidemia, diabetes mellitus, male sex, hypertension and sedentary lifestyle. He is following a generally healthy diet. His overall blood glucose range is 110-130 mg/dl.  Hypertension This is a chronic problem. The current episode started more than 1 year ago. The problem has been resolved since onset. The problem is controlled. Associated symptoms include malaise/fatigue. Pertinent negatives include no peripheral edema or shortness of breath. Past treatments include calcium channel blockers. The current treatment provides moderate improvement. Hypertensive end-organ damage includes CAD/MI.  Depression        This is a chronic problem.  The current episode started more than 1 year ago.   The onset quality is gradual.   The problem occurs intermittently.  The problem has been resolved since onset.  Associated symptoms include restlessness and sad.  Associated symptoms include no helplessness, no hopelessness, no decreased interest and no appetite change.     The symptoms are aggravated by family issues.  Past treatments include SNRIs - Serotonin and norepinephrine reuptake inhibitors.  Compliance with treatment is good. Abscess  PT complaining of a new abscess on his right posterior thigh that he noticed about a week ago that was small, but has become larger. He denies any discharge, but states  it is tender that is about a 3-4 out 10.     Review of Systems  Constitutional: Positive for malaise/fatigue. Negative for appetite change.  Respiratory: Negative for shortness of breath.   Psychiatric/Behavioral: Positive for depression.  All other systems reviewed and are negative.      Objective:   Physical Exam Vitals signs reviewed.  Constitutional:      General: He is not in acute distress.    Appearance: He is well-developed.  HENT:     Head: Normocephalic.     Right Ear: Tympanic membrane normal.     Left Ear: Tympanic membrane normal.  Eyes:     General:        Right eye: No discharge.        Left eye: No discharge.     Pupils: Pupils are equal, round, and reactive to light.  Neck:     Musculoskeletal: Normal range of motion and neck supple.     Thyroid: No thyromegaly.  Cardiovascular:     Rate and Rhythm: Normal rate and regular rhythm.     Heart sounds: Murmur present.  Pulmonary:     Effort: Pulmonary effort is normal. No respiratory distress.     Breath sounds: Wheezing present.  Abdominal:     General: Bowel sounds are normal. There is no distension.     Palpations: Abdomen is soft.     Tenderness: There is no abdominal tenderness.  Musculoskeletal: Normal range of motion.        General: No tenderness.  Skin:    General: Skin is warm and dry.     Findings: Abscess present. No erythema or rash.  Neurological:     Mental Status: He is alert and oriented to person, place, and time.     Cranial Nerves: No cranial nerve deficit.     Deep Tendon Reflexes: Reflexes are normal and symmetric.  Psychiatric:        Behavior: Behavior normal.        Thought Content: Thought content normal.        Judgment: Judgment normal.       BP 131/81   Pulse 61   Temp (!) 97.3 F (36.3 C) (Oral)   Resp 16   Ht '5\' 9"'$  (1.753 m)   Wt 221 lb (100.2 kg)   SpO2 98%   BMI 32.64 kg/m      Assessment & Plan:  Matthew [name] comes in today with chief  complaint of Diabetes   Diagnosis and orders addressed:  1. Type 2 diabetes mellitus with hyperglycemia, without long-term current use of insulin (HCC) - glucose blood test strip; Test up to four times daily  Dispense: 100 each; Refill: 12 - Bayer DCA Hb A1c Waived - Microalbumin / creatinine urine ratio - CMP14+EGFR  2. Essential hypertension - lisinopril (ZESTRIL) 5 MG tablet; Take 1 tablet (5 mg total) by mouth daily.  Dispense: 90 tablet; Refill: 1 - CMP14+EGFR  3. Moderate episode of recurrent major depressive disorder (HCC) - CMP14+EGFR  4. Obesity (BMI 30-39.9) - CMP14+EGFR  5. Tobacco use - CMP14+EGFR  6. Abscess Bactrim Prescription sent to pharmacy  Warm compresses Do not pick or squeeze - CMP14+EGFR   Labs pending Health Maintenance reviewed Diet and exercise encouraged  Follow up plan: 1 month   Evelina Dun, FNP

## 2018-10-25 NOTE — Patient Instructions (Signed)

## 2018-10-26 LAB — CMP14+EGFR
ALT: 15 IU/L (ref 0–44)
AST: 10 IU/L (ref 0–40)
Albumin/Globulin Ratio: 1.8 (ref 1.2–2.2)
Albumin: 4.4 g/dL (ref 3.8–4.9)
Alkaline Phosphatase: 91 IU/L (ref 39–117)
BUN/Creatinine Ratio: 11 (ref 9–20)
BUN: 7 mg/dL (ref 6–24)
Bilirubin Total: 0.3 mg/dL (ref 0.0–1.2)
CO2: 21 mmol/L (ref 20–29)
Calcium: 9.1 mg/dL (ref 8.7–10.2)
Chloride: 104 mmol/L (ref 96–106)
Creatinine, Ser: 0.63 mg/dL — ABNORMAL LOW (ref 0.76–1.27)
GFR calc Af Amer: 125 mL/min/{1.73_m2} (ref >59)
GFR calc non Af Amer: 108 mL/min/{1.73_m2} (ref >59)
Globulin, Total: 2.5 g/dL (ref 1.5–4.5)
Glucose: 94 mg/dL (ref 65–99)
Potassium: 4.2 mmol/L (ref 3.5–5.2)
Sodium: 143 mmol/L (ref 134–144)
Total Protein: 6.9 g/dL (ref 6.0–8.5)

## 2018-10-26 LAB — MICROALBUMIN / CREATININE URINE RATIO
Creatinine, Urine: 136.4 mg/dL
Microalb/Creat Ratio: 24 mg/g creat (ref 0–29)
Microalbumin, Urine: 32.7 ug/mL

## 2018-10-28 ENCOUNTER — Other Ambulatory Visit: Payer: Self-pay | Admitting: *Deleted

## 2018-10-28 ENCOUNTER — Encounter: Payer: Self-pay | Admitting: *Deleted

## 2018-10-28 NOTE — Patient Outreach (Signed)
Outreach call to pt for telephone assessment, spoke with pt, HIPAA verified, pt reports he is doing well, is now living with a friend in [place] until he can get his camper renovated with new insulation and other repairs he is working on and hopes to move in a few weeks, will be living in his camper at [facility].  Pt reports CBG readings have been 98, 117, 130, 86 and continues checking BID, states he had blood work done on 10/25/18 " and everything looked good",  AIC 7.1 and much improved.  Pt states " I don't even have to wear my glasses now, my eyesight is good"  Pt reports he has all medications and taking as prescribed. Pt asks that RN CM call him back next month, he feels the phone calls /interaction have helped him and pt can benefit from reinforcement.  THN CM Care Plan Problem One     Most Recent Value  Care Plan Problem One  Knowledge deficit related to diabetes  Role Documenting the Problem One  Care Management Coordinator  Care Plan for Problem One  Active  THN Long Term Goal   Pt will show improvement in self care for diabetes aeb decrease in AIC of 1-2 points within 60 days  THN Long Term Goal Start Date  09/02/18  [facility] Long Term Goal Met Date  10/28/18  Interventions for Problem One Long Term Goal  RN CM praised and encouraged pt for his efforts of being proactive with his health, AIC 7.1 on 10/25/18  THN CM Short Term Goal #1   Pt will obtain glucometer from pharmacy within one week  THN CM Short Term Goal #1 Start Date  09/02/18  [facility] CM Short Term Goal #1 Met Date  09/17/18  THN CM Short Term Goal #2   Pt will verbalize foods high in carbohydrates and be modify carbohydrate intake within 30 days  THN CM Short Term Goal #2 Start Date  10/28/18 [goal re-established]  Interventions for Short Term Goal #2  RN CM reviewed foods high in carbohydrates to limit, reviewed plate method and foods with lower glycemic index and gave examples of health food choices    [facility] CM Care Plan Problem Two      Most Recent Value  Care Plan Problem Two  Recent issues with poor vision  Role Documenting the Problem Two  Care Management Coordinator  Care Plan for Problem Two  Active  THN CM Short Term Goal #1   Pt will call within one week and schedule appointment for eye exam  THN CM Short Term Goal #1 Start Date  09/17/18  [facility] CM Short Term Goal #1 Met Date   10/08/18    [facility] CM Care Plan Problem Three     Most Recent Value  Care Plan Problem Three  Knowledge deficit related to self care management diabetes  Role Documenting the Problem Three  Care Management Coordinator  Care Plan for Problem Three  Active  THN Long Term Goal   Pt will verbalize/ demonstrate improved self care and healthy choices related to diabetes within 60 days  THN Long Term Goal Start Date  10/28/18  Interventions for Problem Three Long Term Goal  RN CM reviewed importance of continuing to check CBG twice daily and calling MD for any sustained/ongoing changes with CBG, reviewed correlation of elevated CBG on overall health such as eye sight, kidneys etc., keeping stress to a minimum       PLAN Outreach pt for  telephone assessment next month  Jacqlyn Larsen [facility], [place] Coordinator [phone]

## 2018-10-29 ENCOUNTER — Other Ambulatory Visit: Payer: Self-pay | Admitting: Family

## 2018-10-30 ENCOUNTER — Ambulatory Visit: Payer: Medicare Other | Admitting: *Deleted

## 2018-11-21 ENCOUNTER — Other Ambulatory Visit: Payer: Self-pay | Admitting: Family

## 2018-11-25 ENCOUNTER — Other Ambulatory Visit: Payer: Self-pay

## 2018-11-25 ENCOUNTER — Ambulatory Visit (INDEPENDENT_AMBULATORY_CARE_PROVIDER_SITE_OTHER): Payer: Medicare Other | Admitting: Family

## 2018-11-25 ENCOUNTER — Encounter: Payer: Self-pay | Admitting: Family

## 2018-11-25 VITALS — BP 122/83 | HR 60 | Temp 98.0°F | Ht 69.0 in | Wt 213.0 lb

## 2018-11-25 DIAGNOSIS — E669 Obesity, unspecified: Secondary | ICD-10-CM

## 2018-11-25 DIAGNOSIS — Z720 Tobacco use: Secondary | ICD-10-CM | POA: Diagnosis not present

## 2018-11-25 DIAGNOSIS — F331 Major depressive disorder, recurrent, moderate: Secondary | ICD-10-CM | POA: Diagnosis not present

## 2018-11-25 DIAGNOSIS — I10 Essential (primary) hypertension: Secondary | ICD-10-CM

## 2018-11-25 DIAGNOSIS — F4323 Adjustment disorder with mixed anxiety and depressed mood: Secondary | ICD-10-CM | POA: Diagnosis not present

## 2018-11-25 DIAGNOSIS — E785 Hyperlipidemia, unspecified: Secondary | ICD-10-CM | POA: Diagnosis not present

## 2018-11-25 DIAGNOSIS — I4891 Unspecified atrial fibrillation: Secondary | ICD-10-CM

## 2018-11-25 DIAGNOSIS — G500 Trigeminal neuralgia: Secondary | ICD-10-CM

## 2018-11-25 DIAGNOSIS — J441 Chronic obstructive pulmonary disease with (acute) exacerbation: Secondary | ICD-10-CM | POA: Diagnosis not present

## 2018-11-25 DIAGNOSIS — Z8673 Personal history of transient ischemic attack (TIA), and cerebral infarction without residual deficits: Secondary | ICD-10-CM

## 2018-11-25 DIAGNOSIS — E1165 Type 2 diabetes mellitus with hyperglycemia: Secondary | ICD-10-CM | POA: Diagnosis not present

## 2018-11-25 DIAGNOSIS — E559 Vitamin D deficiency, unspecified: Secondary | ICD-10-CM | POA: Diagnosis not present

## 2018-11-25 MED ORDER — SITAGLIPTIN PHOSPHATE 50 MG PO TABS: 50.0000 mg | ORAL_TABLET | Freq: Every day | ORAL | 2 refills | Status: DC

## 2018-11-25 MED ORDER — ASPIRIN EC 81 MG PO TBEC: 81.0000 mg | DELAYED_RELEASE_TABLET | Freq: Every day | ORAL | 3 refills | Status: DC

## 2018-11-25 MED ORDER — ALBUTEROL SULFATE HFA 108 (90 BASE) MCG/ACT IN AERS: INHALATION_SPRAY | RESPIRATORY_TRACT | 3 refills | Status: DC

## 2018-11-25 MED ORDER — TRELEGY ELLIPTA 100-62.5-25 MCG/INH IN AEPB: 1.0000 | INHALATION_SPRAY | Freq: Every day | RESPIRATORY_TRACT | 1 refills | Status: DC

## 2018-11-25 NOTE — Progress Notes (Signed)
Subjective:    Patient ID: Matthew [name], male    DOB: 1960/02/24, 58 y.o.   MRN: [id number]  Chief Complaint  Patient presents with  . Diabetes    recheck   Pt presents to the office today for chronic follow up. He is followed by Cardiologists for A Fib and continues his Eliquis. He is followed by Neurolgoists for trigeminal neuralgia and h/o CVA.  He has lost 13 lbs over the last 3 months. He reports he has not been eating any sweets.  Diabetes He presents for his follow-up diabetic visit. He has type 2 diabetes mellitus. His disease course has been stable. Hypoglycemia symptoms include nervousness/anxiousness. Pertinent negatives for diabetes include no blurred vision and no foot paresthesias. Symptoms are stable. Diabetic complications include heart disease. Pertinent negatives for diabetic complications include no nephropathy. Risk factors for coronary artery disease include dyslipidemia, diabetes mellitus, obesity, male sex, hypertension and sedentary lifestyle. His weight is increasing rapidly. He is following a generally healthy diet. His overall blood glucose range is 110-130 mg/dl.  Hypertension This is a chronic problem. The current episode started more than 1 year ago. The problem has been resolved since onset. The problem is controlled. Associated symptoms include anxiety. Pertinent negatives include no blurred vision, malaise/fatigue, peripheral edema or shortness of breath. Risk factors for coronary artery disease include dyslipidemia, obesity, male gender and sedentary lifestyle. The current treatment provides moderate improvement. Hypertensive end-organ damage includes kidney disease.  Hyperlipidemia This is a chronic problem. The current episode started more than 1 year ago. The problem is uncontrolled. Recent lipid tests were reviewed and are high. Exacerbating diseases include obesity. Pertinent negatives include no shortness of breath. Current antihyperlipidemic treatment includes  statins. The current treatment provides moderate improvement of lipids. Risk factors for coronary artery disease include dyslipidemia, diabetes mellitus, male sex, hypertension, a sedentary lifestyle and post-menopausal.  Depression        This is a chronic problem.  The current episode started more than 1 year ago.   The onset quality is gradual.   The problem occurs intermittently.  Associated symptoms include irritable, restlessness and sad.  Associated symptoms include no helplessness and no hopelessness.  Past treatments include SNRIs - Serotonin and norepinephrine reuptake inhibitors.  Past medical history includes anxiety.   Anxiety Presents for follow-up visit. Symptoms include excessive worry, nervous/anxious behavior and restlessness. Patient reports no shortness of breath. Symptoms occur occasionally. The severity of symptoms is moderate.    Nicotine Dependence Presents for follow-up visit. His urge triggers include company of smokers. He smokes 1 pack of cigarettes per day.  COPD  Continues to smoke about a 1 pack a day. Uses Trelegy daily.     Review of Systems  Constitutional: Negative for malaise/fatigue.  Eyes: Negative for blurred vision.  Respiratory: Negative for shortness of breath.   Psychiatric/Behavioral: Positive for depression. The patient is nervous/anxious.   All other systems reviewed and are negative.      Objective:   Physical Exam Vitals signs reviewed.  Constitutional:      General: He is irritable. He is not in acute distress.    Appearance: He is well-developed.  HENT:     Head: Normocephalic.     Right Ear: Tympanic membrane normal.     Left Ear: Tympanic membrane normal.  Eyes:     General:        Right eye: No discharge.        Left eye: No discharge.  Pupils: Pupils are equal, round, and reactive to light.  Neck:     Musculoskeletal: Normal range of motion and neck supple.     Thyroid: No thyromegaly.  Cardiovascular:     Rate and  Rhythm: Normal rate and regular rhythm.     Heart sounds: Normal heart sounds. No murmur.  Pulmonary:     Effort: Pulmonary effort is normal. No respiratory distress.     Breath sounds: Normal breath sounds. No wheezing.  Abdominal:     General: Bowel sounds are normal. There is no distension.     Palpations: Abdomen is soft.     Tenderness: There is no abdominal tenderness.  Musculoskeletal: Normal range of motion.        General: No tenderness.  Skin:    General: Skin is warm and dry.     Findings: No erythema or rash.  Neurological:     Mental Status: He is alert and oriented to person, place, and time.     Cranial Nerves: No cranial nerve deficit.     Deep Tendon Reflexes: Reflexes are normal and symmetric.  Psychiatric:        Behavior: Behavior normal.        Thought Content: Thought content normal.        Judgment: Judgment normal.       BP 122/83   Pulse 60   Temp 98 F (36.7 C) (Temporal)   Ht 5' 9" (1.753 m)   Wt 213 lb (96.6 kg)   SpO2 97%   BMI 31.45 kg/m      Assessment & Plan:  Matthew [name] comes in today with chief complaint of Diabetes (recheck)   Diagnosis and orders addressed:  1. Type 2 diabetes mellitus with hyperglycemia, without long-term current use of insulin (HCC) - sitaGLIPtin (JANUVIA) 50 MG tablet; Take 1 tablet (50 mg total) by mouth daily.  Dispense: 90 tablet; Refill: 2 - CMP14+EGFR - CBC with Differential/Platelet  2. Chronic obstructive pulmonary disease with acute exacerbation ([place]) - Fluticasone-Umeclidin-Vilant (TRELEGY ELLIPTA) 100-62.5-25 MCG/INH AEPB; Inhale 1 puff into the lungs daily.  Dispense: 3 each; Refill: 1 - albuterol (VENTOLIN HFA) 108 (90 Base) MCG/ACT inhaler; INHALE TWO PUFFS INTO LUNGS EVERY SIX HOURS AS NEEDED FOR WHEEZE OR SHORTNESS OF BREATH.  Dispense: 18 g; Refill: 3 - CMP14+EGFR - CBC with Differential/Platelet  3. COPD exacerbation ([place]) - Fluticasone-Umeclidin-Vilant (TRELEGY ELLIPTA) 100-62.5-25  MCG/INH AEPB; Inhale 1 puff into the lungs daily.  Dispense: 3 each; Refill: 1 - albuterol (VENTOLIN HFA) 108 (90 Base) MCG/ACT inhaler; INHALE TWO PUFFS INTO LUNGS EVERY SIX HOURS AS NEEDED FOR WHEEZE OR SHORTNESS OF BREATH.  Dispense: 18 g; Refill: 3 - CMP14+EGFR - CBC with Differential/Platelet  4. Essential hypertension - CMP14+EGFR - CBC with Differential/Platelet  5. H/O: CVA (cerebrovascular accident) - CMP14+EGFR - CBC with Differential/Platelet  6. Atrial fibrillation with RVR (HCC) - CMP14+EGFR - CBC with Differential/Platelet  7. Trigeminal neuralgia - CMP14+EGFR - CBC with Differential/Platelet  8. Vitamin D deficiency - CMP14+EGFR - CBC with Differential/Platelet - VITAMIN D 25 Hydroxy (Vit-D Deficiency, Fractures)  9. Tobacco - CMP14+EGFR - CBC with Differential/Platelet  10. Obesity (BMI 30-39.9) - CMP14+EGFR - CBC with Differential/Platelet  11. Hyperlipidemia, unspecified hyperlipidemia type - CMP14+EGFR - CBC with Differential/Platelet - Lipid panel  12. Adjustment disorder with mixed anxiety and depressed mood - CMP14+EGFR - CBC with Differential/Platelet  13. Moderate episode of recurrent major depressive disorder (HCC) - CMP14+EGFR - CBC with Differential/Platelet   Labs  pending Health Maintenance reviewed Diet and exercise encouraged  Follow up plan: 3 months    Evelina Dun, FNP

## 2018-11-25 NOTE — Patient Instructions (Signed)

## 2018-11-26 ENCOUNTER — Other Ambulatory Visit: Payer: Self-pay | Admitting: *Deleted

## 2018-11-26 ENCOUNTER — Other Ambulatory Visit: Payer: Self-pay | Admitting: Family

## 2018-11-26 LAB — CBC WITH DIFFERENTIAL/PLATELET
Basophils Absolute: 0.1 10*3/uL (ref 0.0–0.2)
Basos: 1 %
EOS (ABSOLUTE): 0.2 10*3/uL (ref 0.0–0.4)
Eos: 2 %
Hematocrit: 46.5 % (ref 37.5–51.0)
Hemoglobin: 15.3 g/dL (ref 13.0–17.7)
Immature Grans (Abs): 0 10*3/uL (ref 0.0–0.1)
Immature Granulocytes: 0 %
Lymphocytes Absolute: 2.8 10*3/uL (ref 0.7–3.1)
Lymphs: 26 %
MCH: 29.2 pg (ref 26.6–33.0)
MCHC: 32.9 g/dL (ref 31.5–35.7)
MCV: 89 fL (ref 79–97)
Monocytes Absolute: 0.7 10*3/uL (ref 0.1–0.9)
Monocytes: 6 %
Neutrophils Absolute: 7 10*3/uL (ref 1.4–7.0)
Neutrophils: 65 %
Platelets: 242 10*3/uL (ref 150–450)
RBC: 5.24 x10E6/uL (ref 4.14–5.80)
RDW: 11.9 % (ref 11.6–15.4)
WBC: 10.8 10*3/uL (ref 3.4–10.8)

## 2018-11-26 LAB — CMP14+EGFR
ALT: 17 IU/L (ref 0–44)
AST: 14 IU/L (ref 0–40)
Albumin/Globulin Ratio: 1.9 (ref 1.2–2.2)
Albumin: 4.5 g/dL (ref 3.8–4.9)
Alkaline Phosphatase: 94 IU/L (ref 39–117)
BUN/Creatinine Ratio: 19 (ref 9–20)
BUN: 15 mg/dL (ref 6–24)
Bilirubin Total: 0.2 mg/dL (ref 0.0–1.2)
CO2: 20 mmol/L (ref 20–29)
Calcium: 8.8 mg/dL (ref 8.7–10.2)
Chloride: 101 mmol/L (ref 96–106)
Creatinine, Ser: 0.78 mg/dL (ref 0.76–1.27)
GFR calc Af Amer: 114 mL/min/{1.73_m2} (ref >59)
GFR calc non Af Amer: 99 mL/min/{1.73_m2} (ref >59)
Globulin, Total: 2.4 g/dL (ref 1.5–4.5)
Glucose: 83 mg/dL (ref 65–99)
Potassium: 4.1 mmol/L (ref 3.5–5.2)
Sodium: 139 mmol/L (ref 134–144)
Total Protein: 6.9 g/dL (ref 6.0–8.5)

## 2018-11-26 LAB — LIPID PANEL
Chol/HDL Ratio: 2.5 ratio (ref 0.0–5.0)
Cholesterol, Total: 104 mg/dL (ref 100–199)
HDL: 41 mg/dL (ref >39)
LDL Chol Calc (NIH): 45 mg/dL (ref 0–99)
Triglycerides: 93 mg/dL (ref 0–149)
VLDL Cholesterol Cal: 18 mg/dL (ref 5–40)

## 2018-11-26 LAB — VITAMIN D 25 HYDROXY (VIT D DEFICIENCY, FRACTURES): Vit D, 25-Hydroxy: 25.2 ng/mL — ABNORMAL LOW (ref 30.0–100.0)

## 2018-11-26 MED ORDER — VITAMIN D (ERGOCALCIFEROL) 1.25 MG (50000 UNIT) PO CAPS: 50000.0000 [IU] | ORAL_CAPSULE | ORAL | 3 refills | Status: DC

## 2018-11-26 NOTE — Patient Outreach (Signed)
Outreach call to pt for telephone assessment, no answer to telephone, left voicemail requesting return phone call.  Rn CM mailed unsuccessful outreach letter to pt home.  PLAN Outreach pt in 3-4 business days  Jacqlyn Larsen [facility], [place] [phone]

## 2018-12-02 ENCOUNTER — Other Ambulatory Visit: Payer: Self-pay | Admitting: *Deleted

## 2018-12-02 NOTE — Patient Outreach (Signed)
Outreach call to pt (2nd attempt) for telephone assessment, no answer to [phone] and mailbox full, no answer to [phone] and no option to leave voicemail.  PLAN Outreach pt in 3-4 business days  Jacqlyn Larsen [facility], [place] [phone]

## 2018-12-04 ENCOUNTER — Encounter: Payer: Self-pay | Admitting: *Deleted

## 2018-12-05 ENCOUNTER — Other Ambulatory Visit: Payer: Self-pay | Admitting: *Deleted

## 2018-12-05 NOTE — Patient Outreach (Signed)
Outreach call to patient for telephone assessment/ 3rd attempt, no answer to telephone and received message mailbox full, called patient's son [name] with no answer to telephone and no option to leave voicemail.  PLAN Close case in 3-4 business day if no response back from pt  Jacqlyn Larsen [facility], [place] Coordinator [phone]

## 2018-12-11 ENCOUNTER — Other Ambulatory Visit: Payer: Self-pay | Admitting: *Deleted

## 2018-12-11 NOTE — Patient Outreach (Signed)
Case closed due to unable to maintain contact with pt, RN CM mailed case closure letter to pt home and faxed case closure letter to primary MD.  Case closed  Jacqlyn Larsen [facility], [place] Coordinator [phone]

## 2018-12-20 ENCOUNTER — Telehealth: Payer: Self-pay | Admitting: Family

## 2018-12-20 ENCOUNTER — Other Ambulatory Visit: Payer: Self-pay | Admitting: Family

## 2018-12-20 DIAGNOSIS — I4891 Unspecified atrial fibrillation: Secondary | ICD-10-CM

## 2018-12-20 MED ORDER — APIXABAN 5 MG PO TABS: 5.0000 mg | ORAL_TABLET | Freq: Two times a day (BID) | ORAL | 1 refills | Status: DC

## 2018-12-20 NOTE — Telephone Encounter (Signed)
Prescription sent to pharmacy.

## 2019-01-11 ENCOUNTER — Other Ambulatory Visit: Payer: Self-pay | Admitting: Family

## 2019-01-11 DIAGNOSIS — E785 Hyperlipidemia, unspecified: Secondary | ICD-10-CM

## 2019-01-13 ENCOUNTER — Other Ambulatory Visit: Payer: Self-pay | Admitting: Family

## 2019-01-13 DIAGNOSIS — I10 Essential (primary) hypertension: Secondary | ICD-10-CM

## 2019-01-16 ENCOUNTER — Other Ambulatory Visit: Payer: Self-pay | Admitting: Family

## 2019-01-16 DIAGNOSIS — F331 Major depressive disorder, recurrent, moderate: Secondary | ICD-10-CM

## 2019-01-16 DIAGNOSIS — F4323 Adjustment disorder with mixed anxiety and depressed mood: Secondary | ICD-10-CM

## 2019-02-25 ENCOUNTER — Other Ambulatory Visit: Payer: Self-pay

## 2019-02-26 ENCOUNTER — Encounter: Payer: Self-pay | Admitting: Family

## 2019-02-26 ENCOUNTER — Ambulatory Visit (INDEPENDENT_AMBULATORY_CARE_PROVIDER_SITE_OTHER): Payer: Medicare Other | Admitting: Family

## 2019-02-26 VITALS — BP 138/83 | HR 79 | Temp 97.8°F | Ht 69.0 in | Wt 210.0 lb

## 2019-02-26 DIAGNOSIS — G500 Trigeminal neuralgia: Secondary | ICD-10-CM

## 2019-02-26 DIAGNOSIS — Z1211 Encounter for screening for malignant neoplasm of colon: Secondary | ICD-10-CM | POA: Diagnosis not present

## 2019-02-26 DIAGNOSIS — Z720 Tobacco use: Secondary | ICD-10-CM | POA: Diagnosis not present

## 2019-02-26 DIAGNOSIS — J438 Other emphysema: Secondary | ICD-10-CM

## 2019-02-26 DIAGNOSIS — I10 Essential (primary) hypertension: Secondary | ICD-10-CM

## 2019-02-26 DIAGNOSIS — I6930 Unspecified sequelae of cerebral infarction: Secondary | ICD-10-CM

## 2019-02-26 DIAGNOSIS — E1165 Type 2 diabetes mellitus with hyperglycemia: Secondary | ICD-10-CM | POA: Diagnosis not present

## 2019-02-26 DIAGNOSIS — Z1212 Encounter for screening for malignant neoplasm of rectum: Secondary | ICD-10-CM | POA: Diagnosis not present

## 2019-02-26 DIAGNOSIS — E785 Hyperlipidemia, unspecified: Secondary | ICD-10-CM

## 2019-02-26 DIAGNOSIS — I4891 Unspecified atrial fibrillation: Secondary | ICD-10-CM

## 2019-02-26 DIAGNOSIS — I700 Atherosclerosis of aorta: Secondary | ICD-10-CM

## 2019-02-26 DIAGNOSIS — E669 Obesity, unspecified: Secondary | ICD-10-CM | POA: Diagnosis not present

## 2019-02-26 LAB — BAYER DCA HB A1C WAIVED: HB A1C (BAYER DCA - WAIVED): 5 % (ref <7.0)

## 2019-02-26 MED ORDER — FLUTICASONE PROPIONATE 50 MCG/ACT NA SUSP: NASAL | 3 refills | Status: DC

## 2019-02-26 MED ORDER — GLUCOSE BLOOD VI STRP: 1.0000 | ORAL_STRIP | Freq: Every day | 12 refills | Status: DC

## 2019-02-26 MED ORDER — GABAPENTIN 800 MG PO TABS: 800.0000 mg | ORAL_TABLET | Freq: Three times a day (TID) | ORAL | 2 refills | Status: DC

## 2019-02-26 MED ORDER — GLUCOSE BLOOD VI STRP: ORAL_STRIP | 12 refills | Status: DC

## 2019-02-26 MED ORDER — SITAGLIPTIN PHOSPHATE 50 MG PO TABS: 50.0000 mg | ORAL_TABLET | Freq: Every day | ORAL | 2 refills | Status: DC

## 2019-02-26 MED ORDER — METFORMIN HCL 1000 MG PO TABS: 1000.0000 mg | ORAL_TABLET | Freq: Two times a day (BID) | ORAL | 3 refills | Status: DC

## 2019-02-26 MED ORDER — DULOXETINE HCL 60 MG PO CPEP: 60.0000 mg | ORAL_CAPSULE | Freq: Every day | ORAL | 3 refills | Status: DC

## 2019-02-26 NOTE — Patient Instructions (Signed)
Trigeminal Neuralgia  Trigeminal neuralgia is a nerve disorder that causes severe pain on one side of the face. The pain may last from a few seconds to several minutes. The pain is usually only on one side of the face. Symptoms may occur for days, weeks, or months and then go away for months or years. The pain may return and be worse than before. What are the causes? This condition is caused by damage or pressure to a nerve in the head that is called the trigeminal nerve. An attack can be triggered by:  Talking.  Chewing.  Putting on makeup.  Washing your face.  Shaving your face.  Brushing your teeth.  Touching your face. What increases the risk? You are more likely to develop this condition if you:  Are 50 years of age or older.  Are male. What are the signs or symptoms? The main symptom of this condition is severe pain in the:  Jaw.  Lips.  Eyes.  Nose.  Scalp.  Forehead.  Face. The pain may be:  Intense.  Stabbing.  Electric.  Shock-like. How is this diagnosed? This condition is diagnosed with a physical exam. A CT scan or an MRI may be done to rule out other conditions that can cause facial pain. How is this treated? This condition may be treated with:  Avoiding the things that trigger your symptoms.  Taking prescription medicines (anticonvulsants).  Having surgery. This may be done in severe cases if other medical treatment does not provide relief.  Having procedures such as ablation, thermal, or radiation therapy. It may take up to one month for treatment to start relieving the pain. Follow these instructions at home: Managing pain  Learn as much as you can about how to manage your pain. Ask your health care provider if a pain specialist would be helpful.  Consider talking with a mental health care provider (psychologist) about how to cope with the pain.  Consider joining a pain support group. General instructions  Take  over-the-counter and prescription medicines only as told by your health care provider.  Avoid the things that trigger your symptoms. It may help to: ? Chew on the unaffected side of your mouth. ? Avoid touching your face. ? Avoid blasts of hot or cold air.  Follow your treatment plan as told by your health care provider. This may include: ? Cognitive or behavioral therapy. ? Gentle, regular exercise. ? Meditation or yoga. ? Aromatherapy.  Keep all follow-up visits as told by your health care provider. You may need to be monitored closely to make sure treatment is working well for you. Where to find more information  Facial Pain Association: fpa-support.org Contact a health care provider if:  Your medicine is not helping your symptoms.  You have side effects from the medicine used for treatment.  You develop new, unexplained symptoms, such as: ? Double vision. ? Facial weakness. ? Facial numbness. ? Changes in hearing or balance.  You feel depressed. Get help right away if:  Your pain is severe and is not getting better.  You develop suicidal thoughts. If you ever feel like you may hurt yourself or others, or have thoughts about taking your own life, get help right away. You can go to your nearest emergency department or call:  Your local emergency services (911 in the [place]  A suicide crisis helpline, such as the National Suicide Prevention Lifeline at 1-800-273-8255. This is open 24 hours a day. Summary  Trigeminal neuralgia is a   nerve disorder that causes severe pain on one side of the face. The pain may last from a few seconds to several minutes.  This condition is caused by damage or pressure to a nerve in the head that is called the trigeminal nerve.  Treatment may include avoiding the things that trigger your symptoms, taking medicines, or having surgery or procedures. It may take up to one month for treatment to start relieving the pain.  Avoid the things that  trigger your symptoms.  Keep all follow-up visits as told by your health care provider. You may need to be monitored closely to make sure treatment is working well for you. This information is not intended to replace advice given to you by your health care provider. Make sure you discuss any questions you have with your health care provider. Document Revised: 12/17/2017 Document Reviewed: 12/17/2017 Elsevier Patient Education  2020 [employer]  

## 2019-02-26 NOTE — Addendum Note (Signed)
Addended by: Evelina Dun A on: 02/26/2019 02:32 PM   Modules accepted: Orders

## 2019-02-26 NOTE — Progress Notes (Signed)
Subjective:    Patient ID: Matthew [name], male    DOB: 07/16/1960, 59 y.o.   MRN: [id number]  Chief Complaint  Patient presents with  . Medical Management of Chronic Issues   Pt presents to the office todayfor chronic follow up. He is followed by Cardiologists for A Fib and continues his Eliquis. He is followed by Neurolgoists for trigeminal neuralgia and h/o CVA. He has lost 16 lbs. He reports he has not been eating any sweets. Reports his glucose is doing well now.  Diabetes He presents for his follow-up diabetic visit. He has type 2 diabetes mellitus. His disease course has been stable. There are no hypoglycemic associated symptoms. Pertinent negatives for diabetes include no blurred vision and no foot paresthesias. Symptoms are stable. Diabetic complications include heart disease. Risk factors for coronary artery disease include dyslipidemia, diabetes mellitus, hypertension, male sex and sedentary lifestyle. He is following a generally healthy diet. His overall blood glucose range is 90-110 mg/dl. An ACE inhibitor/angiotensin II receptor blocker is being taken. Eye exam is not current.  Hypertension This is a chronic problem. The current episode started more than 1 year ago. The problem has been resolved since onset. The problem is controlled. Pertinent negatives include no blurred vision, malaise/fatigue, peripheral edema or shortness of breath. Risk factors for coronary artery disease include dyslipidemia, diabetes mellitus, obesity, male gender, smoking/tobacco exposure and sedentary lifestyle. The current treatment provides moderate improvement. There is no history of heart failure.  Hyperlipidemia This is a chronic problem. The current episode started more than 1 year ago. The problem is controlled. Recent lipid tests were reviewed and are normal. Factors aggravating his hyperlipidemia include smoking. Pertinent negatives include no shortness of breath. Current antihyperlipidemic treatment  includes statins. The current treatment provides moderate improvement of lipids. Risk factors for coronary artery disease include dyslipidemia, diabetes mellitus, male sex, hypertension and a sedentary lifestyle.  Nicotine Dependence Presents for follow-up visit. His urge triggers include company of smokers. He smokes 1 pack of cigarettes per day.      Review of Systems  Constitutional: Negative for malaise/fatigue.  Eyes: Negative for blurred vision.  Respiratory: Negative for shortness of breath.   All other systems reviewed and are negative.      Objective:   Physical Exam Vitals reviewed.  Constitutional:      General: He is not in acute distress.    Appearance: He is well-developed. He is obese.  HENT:     Head: Normocephalic.     Right Ear: Tympanic membrane normal.     Left Ear: Tympanic membrane normal.  Eyes:     General:        Right eye: No discharge.        Left eye: No discharge.     Pupils: Pupils are equal, round, and reactive to light.  Neck:     Thyroid: No thyromegaly.  Cardiovascular:     Rate and Rhythm: Normal rate and regular rhythm.     Heart sounds: Normal heart sounds. No murmur.  Pulmonary:     Effort: Pulmonary effort is normal. No respiratory distress.     Breath sounds: Wheezing (left lower) present.  Abdominal:     General: Bowel sounds are normal. There is no distension.     Palpations: Abdomen is soft.     Tenderness: There is no abdominal tenderness.  Musculoskeletal:        General: No tenderness. Normal range of motion.     Cervical back:  Normal range of motion and neck supple.  Skin:    General: Skin is warm and dry.     Findings: No erythema or rash.  Neurological:     Mental Status: He is alert and oriented to person, place, and time.     Cranial Nerves: No cranial nerve deficit.     Deep Tendon Reflexes: Reflexes are normal and symmetric.  Psychiatric:        Behavior: Behavior normal.        Thought Content: Thought content  normal.        Judgment: Judgment normal.     BP 138/83   Pulse 79   Temp 97.8 F (36.6 C) (Temporal)   Ht '5\' 9"'$  (1.753 m)   Wt 210 lb (95.3 kg)   SpO2 95%   BMI 31.01 kg/m      Assessment & Plan:  Matthew [name] comes in today with chief complaint of Medical Management of Chronic Issues   Diagnosis and orders addressed:  1. Type 2 diabetes mellitus with hyperglycemia, without long-term current use of insulin (HCC) - hgba1c - glucose blood test strip; Test up to four times daily  Dispense: 100 each; Refill: 12 - metFORMIN (GLUCOPHAGE) 1000 MG tablet; Take 1 tablet (1,000 mg total) by mouth 2 (two) times daily with a meal.  Dispense: 180 tablet; Refill: 3 - sitaGLIPtin (JANUVIA) 50 MG tablet; Take 1 tablet (50 mg total) by mouth daily.  Dispense: 90 tablet; Refill: 2 - CMP14+EGFR - CBC with Differential/Platelet  2. Essential hypertension - CMP14+EGFR - CBC with Differential/Platelet  3. Trigeminal neuralgia - CMP14+EGFR - CBC with Differential/Platelet - gabapentin (NEURONTIN) 800 MG tablet; Take 1 tablet (800 mg total) by mouth 3 (three) times daily.  Dispense: 90 tablet; Refill: 2  4. Other emphysema ([place]) - CMP14+EGFR - CBC with Differential/Platelet  5. Hyperlipidemia, unspecified hyperlipidemia type - CMP14+EGFR - CBC with Differential/Platelet - Lipid panel  6. Obesity (BMI 30-39.9) - CMP14+EGFR - CBC with Differential/Platelet  7. Tobacco use - CMP14+EGFR - CBC with Differential/Platelet  8. Late effect of cerebrovascular accident (CVA) - CMP14+EGFR - CBC with Differential/Platelet  9. Thoracic aortic atherosclerosis (HCC) - CMP14+EGFR - CBC with Differential/Platelet  10. Atrial fibrillation with RVR (HCC) - CMP14+EGFR - CBC with Differential/Platelet  11. Screen for colon cancer - Cologuard - CMP14+EGFR - CBC with Differential/Platelet  12. Screening for cancer of the rectum - Cologuard - CMP14+EGFR - CBC with  Differential/Platelet   Labs pending Health Maintenance reviewed Diet and exercise encouraged  Follow up plan: 3 months     Evelina Dun, FNP

## 2019-02-27 ENCOUNTER — Other Ambulatory Visit: Payer: Self-pay | Admitting: *Deleted

## 2019-02-27 ENCOUNTER — Other Ambulatory Visit: Payer: Self-pay | Admitting: Family

## 2019-02-27 LAB — CMP14+EGFR
ALT: 12 IU/L (ref 0–44)
AST: 11 IU/L (ref 0–40)
Albumin/Globulin Ratio: 2.1 (ref 1.2–2.2)
Albumin: 4.5 g/dL (ref 3.8–4.9)
Alkaline Phosphatase: 91 IU/L (ref 39–117)
BUN/Creatinine Ratio: 11 (ref 9–20)
BUN: 6 mg/dL (ref 6–24)
Bilirubin Total: 0.3 mg/dL (ref 0.0–1.2)
CO2: 24 mmol/L (ref 20–29)
Calcium: 8.8 mg/dL (ref 8.7–10.2)
Chloride: 104 mmol/L (ref 96–106)
Creatinine, Ser: 0.53 mg/dL — ABNORMAL LOW (ref 0.76–1.27)
GFR calc Af Amer: 134 mL/min/{1.73_m2} (ref >59)
GFR calc non Af Amer: 116 mL/min/{1.73_m2} (ref >59)
Globulin, Total: 2.1 g/dL (ref 1.5–4.5)
Glucose: 86 mg/dL (ref 65–99)
Potassium: 3.9 mmol/L (ref 3.5–5.2)
Sodium: 141 mmol/L (ref 134–144)
Total Protein: 6.6 g/dL (ref 6.0–8.5)

## 2019-02-27 LAB — CBC WITH DIFFERENTIAL/PLATELET
Basophils Absolute: 0.1 10*3/uL (ref 0.0–0.2)
Basos: 1 %
EOS (ABSOLUTE): 0.2 10*3/uL (ref 0.0–0.4)
Eos: 2 %
Hematocrit: 41.9 % (ref 37.5–51.0)
Hemoglobin: 14.3 g/dL (ref 13.0–17.7)
Immature Grans (Abs): 0 10*3/uL (ref 0.0–0.1)
Immature Granulocytes: 1 %
Lymphocytes Absolute: 2.1 10*3/uL (ref 0.7–3.1)
Lymphs: 28 %
MCH: 29.4 pg (ref 26.6–33.0)
MCHC: 34.1 g/dL (ref 31.5–35.7)
MCV: 86 fL (ref 79–97)
Monocytes Absolute: 0.5 10*3/uL (ref 0.1–0.9)
Monocytes: 7 %
Neutrophils Absolute: 4.5 10*3/uL (ref 1.4–7.0)
Neutrophils: 61 %
Platelets: 219 10*3/uL (ref 150–450)
RBC: 4.86 x10E6/uL (ref 4.14–5.80)
RDW: 13.6 % (ref 11.6–15.4)
WBC: 7.4 10*3/uL (ref 3.4–10.8)

## 2019-02-27 LAB — LIPID PANEL
Chol/HDL Ratio: 2.1 ratio (ref 0.0–5.0)
Cholesterol, Total: 117 mg/dL (ref 100–199)
HDL: 57 mg/dL (ref >39)
LDL Chol Calc (NIH): 46 mg/dL (ref 0–99)
Triglycerides: 64 mg/dL (ref 0–149)
VLDL Cholesterol Cal: 14 mg/dL (ref 5–40)

## 2019-02-27 MED ORDER — GLUCOSE BLOOD VI STRP: ORAL_STRIP | 3 refills | Status: DC

## 2019-02-27 NOTE — Telephone Encounter (Signed)
Fax from [place] Non insulin DM ins will only cover daily testing

## 2019-03-19 ENCOUNTER — Ambulatory Visit (INDEPENDENT_AMBULATORY_CARE_PROVIDER_SITE_OTHER): Payer: Medicare Other | Admitting: *Deleted

## 2019-03-19 DIAGNOSIS — Z0000 Encounter for general adult medical examination without abnormal findings: Secondary | ICD-10-CM | POA: Diagnosis not present

## 2019-03-19 NOTE — Progress Notes (Signed)
MEDICARE ANNUAL WELLNESS VISIT  03/19/2019  Telephone Visit Disclaimer This Medicare AWV was conducted by telephone due to national recommendations for restrictions regarding the COVID-19 Pandemic (e.g. social distancing).  I verified, using two identifiers, that I am speaking with Jennefer Bravo Shor or their authorized healthcare agent. I discussed the limitations, risks, security, and privacy concerns of performing an evaluation and management service by telephone and the potential availability of an in-person appointment in the future. The patient expressed understanding and agreed to proceed.   Subjective:  MYSHAWN CHIRIBOGA is a 59 y.o. male patient of Hawks, Theador Hawthorne, FNP who had a Medicare Annual Wellness Visit today via telephone. Merik is Disabled and lives alone. He has 3 sons. He reports that he is socially active and does interact with friends/family regularly. He is minimally physically active and enjoys fishing.  Patient Care Team: [name], FNP as PCP - General (Nurse Practitioner) Satira Sark, MD as PCP - Cardiology (Cardiology)  Advanced Directives 03/19/2019 09/02/2018 08/28/2018 08/28/2018 03/28/2018 03/21/2018 03/20/2018  Does Patient Have a Medical Advance Directive? No No No No No No No  Would patient like information on creating a medical advance directive? No - Patient declined No - Patient declined No - Patient declined No - Patient declined No - Patient declined No - Patient declined No - Patient declined    Hospital Utilization Over the Past 12 Months: # of hospitalizations or ER visits: 5 # of surgeries: 0  Review of Systems    Patient reports that his overall health is unchanged compared to last year.  History obtained from chart review and the patient ENT ROS: positive for - oral lesions  Patient Reported Readings (BP, Pulse, CBG, Weight, etc) CBG 117  Pain Assessment Pain : 0-10 Pain Score: 2  Pain Type: Acute pain Pain Location: Mouth Pain Onset:  More than a month ago Pain Frequency: Intermittent Pain Relieving Factors: Mouth wash  Pain Relieving Factors: Mouth wash  Current Medications & Allergies (verified) Allergies as of 03/19/2019      Reactions   Penicillins Other (See Comments)   Did it involve swelling of the face/tongue/throat, SOB, or low BP? Unknown Did it involve sudden or severe rash/hives, skin peeling, or any reaction on the inside of your mouth or nose? Unknown Did you need to seek medical attention at a hospital or doctor's office? Unknown When did it last happen?childhood If all above answers are "NO", may proceed with cephalosporin use.      Medication List       Accurate as of March 19, 2019  9:21 AM. If you have any questions, ask your nurse or doctor.        albuterol 108 (90 Base) MCG/ACT inhaler Commonly known as: Ventolin HFA INHALE TWO PUFFS INTO LUNGS EVERY SIX HOURS AS NEEDED FOR WHEEZE OR SHORTNESS OF BREATH.   amLODipine 10 MG tablet Commonly known as: NORVASC TAKE 1 TABLET BY MOUTH EVERY DAY   apixaban 5 MG Tabs tablet Commonly known as: Eliquis Take 1 tablet (5 mg total) by mouth 2 (two) times daily. What changed: Another medication with the same name was removed. Continue taking this medication, and follow the directions you see here.   atorvastatin 40 MG tablet Commonly known as: LIPITOR TAKE 1 TABLET (40 MG TOTAL) BY MOUTH DAILY AT 6 PM.   blood glucose meter kit and supplies Dispense based on patient and insurance preference. Use up to four times daily as directed. (  FOR ICD-10 E10.9, E11.9).   carvedilol 3.125 MG tablet Commonly known as: COREG TAKE 1 TABLET (3.125 MG TOTAL) BY MOUTH 2 (TWO) TIMES DAILY WITH A MEAL.   DULoxetine 60 MG capsule Commonly known as: Cymbalta Take 1 capsule (60 mg total) by mouth daily.   fluticasone 50 MCG/ACT nasal spray Commonly known as: FLONASE USE 2 SPRAYS INTO BOTH NOSTRILS ONCE DAILY   gabapentin 800 MG tablet Commonly known as:  Neurontin Take 1 tablet (800 mg total) by mouth 3 (three) times daily.   glucose blood test strip Test BS daily Dx E11.65   levETIRAcetam 500 MG tablet Commonly known as: KEPPRA TAKE 1 TABLET BY MOUTH TWICE A DAY   lisinopril 5 MG tablet Commonly known as: ZESTRIL Take 1 tablet (5 mg total) by mouth daily.   lisinopril-hydrochlorothiazide 20-12.5 MG tablet Commonly known as: ZESTORETIC Take 2 tablets by mouth daily.   loratadine 10 MG tablet Commonly known as: CLARITIN Take 1 tablet (10 mg total) by mouth daily.   metFORMIN 1000 MG tablet Commonly known as: GLUCOPHAGE Take 1 tablet (1,000 mg total) by mouth 2 (two) times daily with a meal.   Trelegy Ellipta 100-62.5-25 MCG/INH Aepb Generic drug: Fluticasone-Umeclidin-Vilant Inhale 1 puff into the lungs daily.   Vitamin D (Ergocalciferol) 1.25 MG (50000 UNIT) Caps capsule Commonly known as: DRISDOL Take 1 capsule (50,000 Units total) by mouth every 7 (seven) days.       History (reviewed): Past Medical History:  Diagnosis Date  . Abscess   . Anxiety   . Aphthous ulcer   . Arthritis   . Asthma   . Chronic bronchitis   . Chronic pain   . COPD (chronic obstructive pulmonary disease) ([place])   . CVA (cerebral vascular accident) ([place])   . Depression   . Hyperlipidemia   . Hypertension   . Trigeminal neuralgia   . Vertigo    Past Surgical History:  Procedure Laterality Date  . CHOLECYSTECTOMY    . HAND RECONSTRUCTION Right 1990's   Family History  Problem Relation Age of Onset  . Hypertension Mother   . Diabetes Mother   . Hypertension Father   . Diabetes Father   . Cancer Father   . Emphysema Father   . Early death Brother    Social History   Socioeconomic History  . Marital status: Legally Separated    Spouse name: Not on file  . Number of children: 3  . Years of education: 9  . Highest education level: 9th grade  Occupational History  . Occupation: Disabled  Tobacco Use  . Smoking status:  Current Every Day Smoker    Packs/day: 2.00    Years: 40.00    Pack years: 80.00    Types: Cigarettes    Start date: 07/23/1975  . Smokeless tobacco: Never Used  Substance and Sexual Activity  . Alcohol use: Not Currently    Comment: 08/21/2012 "quit drinking ~ 1985"  . Drug use: No  . Sexual activity: Yes  Other Topics Concern  . Not on file  Social History Narrative   Highest level of education: 9th grade   Social Determinants of Health   Financial Resource Strain:   . Difficulty of Paying Living Expenses: Not on file  Food Insecurity:   . Worried About Charity fundraiser in the Last Year: Not on file  . Ran Out of Food in the Last Year: Not on file  Transportation Needs:   . Lack of Transportation (Medical): Not  on file  . Lack of Transportation (Non-Medical): Not on file  Physical Activity:   . Days of Exercise per Week: Not on file  . Minutes of Exercise per Session: Not on file  Stress:   . Feeling of Stress : Not on file  Social Connections:   . Frequency of Communication with Friends and Family: Not on file  . Frequency of Social Gatherings with Friends and Family: Not on file  . Attends Religious Services: Not on file  . Active Member of Clubs or Organizations: Not on file  . Attends Archivist Meetings: Not on file  . Marital Status: Not on file    Activities of Daily Living In your present state of health, do you have any difficulty performing the following activities: 03/19/2019 09/02/2018  Hearing? N N  Vision? N N  Comment wears reading glasses -  Difficulty concentrating or making decisions? N N  Walking or climbing stairs? Y N  Comment At times he uses a walking stick -  Dressing or bathing? N N  Doing errands, shopping? N N  Preparing Food and eating ? N N  Using the Toilet? N N  In the past six months, have you accidently leaked urine? N N  Do you have problems with loss of bowel control? N N  Managing your Medications? N N  Managing your  Finances? N N  Housekeeping or managing your Housekeeping? N N  Some recent data might be hidden    Patient Education/ Literacy How often do you need to have someone help you when you read instructions, pamphlets, or other written materials from your doctor or pharmacy?: 5 - Always What is the last grade level you completed in school?: Patient is unable to read and write  Exercise Current Exercise Habits: Home exercise routine, Type of exercise: walking, Time (Minutes): 10, Frequency (Times/Week): 4, Weekly Exercise (Minutes/Week): 40, Intensity: Mild, Exercise limited by: neurologic condition(s)  Diet Patient reports consuming 3 meals a day and 1 snack(s) a day Patient reports that his primary diet is: Regular Patient reports that she does have regular access to food.   Depression Screen PHQ 2/9 Scores 03/19/2019 03/19/2019 02/26/2019 11/25/2018 11/25/2018 10/25/2018 09/24/2018  PHQ - 2 Score 0 0 0 0 0 0 0  PHQ- 9 Score 0 0 0 0 - - -     Fall Risk Fall Risk  03/19/2019 03/19/2019 11/25/2018 10/25/2018 09/24/2018  Falls in the past year? 0 0 0 0 0  Comment - - - - -  Number falls in past yr: - 0 - - -  Injury with Fall? - - - - -  Risk for fall due to : - - - - -  Follow up - Falls prevention discussed - - -     Objective:  Praneeth R Grether seemed alert and oriented and he participated appropriately during our telephone visit.  Blood Pressure Weight BMI  BP Readings from Last 3 Encounters:  02/26/19 138/83  11/25/18 122/83  10/25/18 131/81   Wt Readings from Last 3 Encounters:  02/26/19 210 lb (95.3 kg)  11/25/18 213 lb (96.6 kg)  10/25/18 221 lb (100.2 kg)   BMI Readings from Last 1 Encounters:  02/26/19 31.01 kg/m    *Unable to obtain current vital signs, weight, and BMI due to telephone visit type  Hearing/Vision  . Terrel did not seem to have difficulty with hearing/understanding during the telephone conversation . Reports that he has not had a formal eye  exam by an eye care  professional within the past year . Reports that he has not had a formal hearing evaluation within the past year *Unable to fully assess hearing and vision during telephone visit type  Cognitive Function: 6CIT Screen 03/19/2019  What Year? 0 points  What month? 3 points  What time? 3 points  Count back from 20 0 points  Months in reverse 4 points  Repeat phrase 10 points  Total Score 20   (Normal:0-7, Significant for Dysfunction: >8)  Normal Cognitive Function Screening: No: Patient states he is unable to read and write. Patient states he does not know the months of the year.    Immunization & Health Maintenance Record Immunization History  Administered Date(s) Administered  . Influenza,inj,Quad PF,6+ Mos 01/13/2014, 11/24/2014, 12/02/2015, 10/23/2017, 10/25/2018  . Pneumococcal Polysaccharide-23 08/22/2012    Health Maintenance  Topic Date Due  . OPHTHALMOLOGY EXAM  07/22/1969  . Fecal DNA (Cologuard)  07/22/2009  . COLON CANCER SCREENING ANNUAL FOBT  03/14/2016  . TETANUS/TDAP  04/17/2019  . HEMOGLOBIN A1C  08/26/2019  . FOOT EXAM  09/24/2019  . INFLUENZA VACCINE  Completed  . PNEUMOCOCCAL POLYSACCHARIDE VACCINE AGE 53-64 HIGH RISK  Completed  . Hepatitis C Screening  Completed  . HIV Screening  Completed       Assessment  This is a routine wellness examination for Couper R Belasco.  Health Maintenance: Due or Overdue Health Maintenance Due  Topic Date Due  . OPHTHALMOLOGY EXAM  07/22/1969  . Fecal DNA (Cologuard)  07/22/2009  . COLON CANCER SCREENING ANNUAL FOBT  03/14/2016    Jennefer Bravo Gremillion does not need a referral for Community Assistance: Care Management:   Patient receiving care management with Jacqlyn Larsen Social Work:    no Prescription Assistance:  no Nutrition/Diabetes Education:  Patient receiving care management with Jacqlyn Larsen   Plan:  Personalized Goals Goals Addressed            This Visit's Progress   . AWV Goal       03/19/2019 AWV Goal:  Fall Prevention  . Over the next year, patient will decrease their risk for falls by: o Using assistive devices, such as a cane or walker, as needed o Identifying fall risks within their home and correcting them by: - Removing throw rugs - Adding handrails to stairs or ramps - Removing clutter and keeping a clear pathway throughout the home - Increasing light, especially at night - Adding shower handles/bars - Raising toilet seat o Identifying potential personal risk factors for falls: - Medication side effects - Incontinence/urgency - Vestibular dysfunction - Hearing loss - Musculoskeletal disorders - Neurological disorders - Orthostatic hypotension      . AWV Goal       03/19/2019 AWV Goal: Diabetes Management  . Patient will maintain an A1C level below 8.0 . Patient will not develop any diabetic foot complications . Patient will not experience any hypoglycemic episodes over the next 3 months . Patient will notify our office of any CBG readings outside of the provider recommended range by calling [phone] . Patient will adhere to provider recommendations for diabetes management  Patient Self Management Activities . take all medications as prescribed and report any negative side effects . monitor and record blood sugar readings as directed . adhere to a low carbohydrate diet that incorporates lean proteins, vegetables, whole grains, low glycemic fruits . check feet daily noting any sores, cracks, injuries, or callous formations . see PCP or podiatrist if  he notices any changes in his legs, feet, or toenails . Patient will visit PCP and have an A1C level checked every 3 to 6 months as directed  . have a yearly eye exam to monitor for vascular changes associated with diabetes and will request that the report be sent to his pcp.  . consult with his PCP regarding any changes in his health or new or worsening symptoms       Personalized Health Maintenance & Screening  Recommendations  Colorectal cancer screening Smoking cessation counseling Advanced directives: has NO advanced directive - not interested in additional information Patient is not interested at this time. States son knows what he would like done if something happened.   Lung Cancer Screening Recommended: yes (Low Dose CT Chest recommended if Age 51-80 years, 30 pack-year currently smoking OR have quit w/in past 15 years) Hepatitis C Screening recommended: no HIV Screening recommended: no  Advanced Directives: Written information was not prepared per patient's request.  Referrals & Orders No orders of the defined types were placed in this encounter.   Follow-up Plan . Follow-up with [name], FNP as planned . Schedule eye exam.  . Please return cologuard when received  . We will discuss Td at next visit   I have personally reviewed and noted the following in the patient's chart:   . Medical and social history . Use of alcohol, tobacco or illicit drugs  . Current medications and supplements . Functional ability and status . Nutritional status . Physical activity . Advanced directives . List of other physicians . Hospitalizations, surgeries, and ER visits in previous 12 months . Vitals . Screenings to include cognitive, depression, and falls . Referrals and appointments  In addition, I have reviewed and discussed with Abe People R Santee certain preventive protocols, quality metrics, and best practice recommendations. A written personalized care plan for preventive services as well as general preventive health recommendations is available and can be mailed to the patient at his request.      Gareth Morgan, LPN  06/18/2561

## 2019-04-06 ENCOUNTER — Other Ambulatory Visit: Payer: Self-pay | Admitting: Family

## 2019-04-06 DIAGNOSIS — E785 Hyperlipidemia, unspecified: Secondary | ICD-10-CM

## 2019-04-11 ENCOUNTER — Other Ambulatory Visit: Payer: Self-pay | Admitting: Family Medicine

## 2019-04-11 DIAGNOSIS — J301 Allergic rhinitis due to pollen: Secondary | ICD-10-CM

## 2019-04-16 ENCOUNTER — Telehealth: Payer: Self-pay | Admitting: Family

## 2019-04-16 NOTE — Telephone Encounter (Signed)
  Medication Request  04/16/2019  What is the name of the medication? Magic mouth wash  Have you contacted your pharmacy to request a refill? Yes  Which pharmacy would you like this sent to? [facility]   Patient notified that their request is being sent to the clinical staff for review and that they should receive a call once it is complete. If they do not receive a call within 24 hours they can check with their pharmacy or our office.   Lenna Gilford' pt  Please call pt.

## 2019-04-16 NOTE — Telephone Encounter (Signed)
I called that in for him. WS

## 2019-04-18 ENCOUNTER — Other Ambulatory Visit: Payer: Self-pay | Admitting: Family

## 2019-04-18 DIAGNOSIS — I4891 Unspecified atrial fibrillation: Secondary | ICD-10-CM

## 2019-05-05 ENCOUNTER — Other Ambulatory Visit: Payer: Self-pay | Admitting: Family

## 2019-05-05 DIAGNOSIS — J441 Chronic obstructive pulmonary disease with (acute) exacerbation: Secondary | ICD-10-CM

## 2019-05-27 ENCOUNTER — Encounter: Payer: Self-pay | Admitting: Family

## 2019-05-27 ENCOUNTER — Ambulatory Visit (INDEPENDENT_AMBULATORY_CARE_PROVIDER_SITE_OTHER): Payer: Medicare Other | Admitting: Family

## 2019-05-27 DIAGNOSIS — I4891 Unspecified atrial fibrillation: Secondary | ICD-10-CM | POA: Diagnosis not present

## 2019-05-27 DIAGNOSIS — G500 Trigeminal neuralgia: Secondary | ICD-10-CM

## 2019-05-27 DIAGNOSIS — F331 Major depressive disorder, recurrent, moderate: Secondary | ICD-10-CM | POA: Diagnosis not present

## 2019-05-27 DIAGNOSIS — Z8673 Personal history of transient ischemic attack (TIA), and cerebral infarction without residual deficits: Secondary | ICD-10-CM | POA: Diagnosis not present

## 2019-05-27 DIAGNOSIS — I480 Paroxysmal atrial fibrillation: Secondary | ICD-10-CM

## 2019-05-27 DIAGNOSIS — J438 Other emphysema: Secondary | ICD-10-CM | POA: Diagnosis not present

## 2019-05-27 DIAGNOSIS — E785 Hyperlipidemia, unspecified: Secondary | ICD-10-CM | POA: Diagnosis not present

## 2019-05-27 DIAGNOSIS — E1142 Type 2 diabetes mellitus with diabetic polyneuropathy: Secondary | ICD-10-CM

## 2019-05-27 DIAGNOSIS — Z720 Tobacco use: Secondary | ICD-10-CM

## 2019-05-27 DIAGNOSIS — I6930 Unspecified sequelae of cerebral infarction: Secondary | ICD-10-CM

## 2019-05-27 DIAGNOSIS — I10 Essential (primary) hypertension: Secondary | ICD-10-CM

## 2019-05-27 NOTE — Progress Notes (Signed)
Virtual Visit via telephone Note Due to COVID-19 pandemic this visit was conducted virtually. This visit type was conducted due to national recommendations for restrictions regarding the COVID-19 Pandemic (e.g. social distancing, sheltering in place) in an effort to limit this patient's exposure and mitigate transmission in our community. All issues noted in this document were discussed and addressed.  A physical exam was not performed with this format.  I connected with Matthew [name] on 05/27/19 at 10:14 AM by telephone and verified that I am speaking with the correct person using two identifiers. Matthew [name] is currently located at home and friend is currently with him during visit. The provider, Evelina Dun, FNP is located in their office at time of visit.  I discussed the limitations, risks, security and privacy concerns of performing an evaluation and management service by telephone and the availability of in person appointments. I also discussed with the patient that there may be a patient responsible charge related to this service. The patient expressed understanding and agreed to proceed.   History and Present Illness:  Pt presents to the office todayfor chronic follow up. He reports he has not seen his Cardiologists and Neurologists in "awhile". He reports his A Fib and Trigeminal neuralgia is stable.  Diabetes He presents for his follow-up diabetic visit. He has type 2 diabetes mellitus. His disease course has been stable. Associated symptoms include foot paresthesias. Pertinent negatives for diabetes include no blurred vision. There are no hypoglycemic complications. Symptoms are stable. Diabetic complications include a CVA and heart disease. Risk factors for coronary artery disease include diabetes mellitus, dyslipidemia, family history, male sex, hypertension, sedentary lifestyle and stress. He is following a generally unhealthy diet. His overall blood glucose range is 110-130 mg/dl.  Eye exam is not current.  Hypertension This is a chronic problem. The current episode started more than 1 year ago. The problem has been resolved since onset. The problem is controlled. Associated symptoms include anxiety and malaise/fatigue. Pertinent negatives include no blurred vision, peripheral edema or shortness of breath. Risk factors for coronary artery disease include dyslipidemia, diabetes mellitus, obesity and male gender. The current treatment provides moderate improvement. Hypertensive end-organ damage includes CVA.  Nicotine Dependence Presents for follow-up visit. Symptoms include decreased concentration and irritability. His urge triggers include company of smokers. The symptoms have been stable. He smokes 1 pack of cigarettes per day.  Hyperlipidemia This is a chronic problem. The current episode started more than 1 year ago. Factors aggravating his hyperlipidemia include smoking. Pertinent negatives include no shortness of breath. Current antihyperlipidemic treatment includes statins. The current treatment provides moderate improvement of lipids. Risk factors for coronary artery disease include dyslipidemia, male sex and hypertension.  Depression        This is a chronic problem.  The current episode started more than 1 year ago.   The onset quality is gradual.   The problem occurs intermittently.  Associated symptoms include decreased concentration, irritable, restlessness, decreased interest and sad.  Associated symptoms include no helplessness and no hopelessness.  Past treatments include SNRIs - Serotonin and norepinephrine reuptake inhibitors.  Compliance with treatment is good.  Past medical history includes anxiety.   Anxiety Presents for follow-up visit. Symptoms include decreased concentration, depressed mood, excessive worry, irritability and restlessness. Patient reports no shortness of breath.    COPD Pt continues to smoke 1-1 1/2 pack. Has SOB with exertion.    Review  of Systems  Constitutional: Positive for irritability and malaise/fatigue.  Eyes:  Negative for blurred vision.  Respiratory: Negative for shortness of breath.   Psychiatric/Behavioral: Positive for decreased concentration and depression.  All other systems reviewed and are negative.    Observations/Objective: No SOB or distress noted   Assessment and Plan: Matthew [name] comes in today with chief complaint of No chief complaint on file.   Diagnosis and orders addressed:  1. Atrial fibrillation with RVR (HCC) - CMP14+EGFR; Future - CBC with Differential/Platelet; Future  2. Essential hypertension - CMP14+EGFR; Future - CBC with Differential/Platelet; Future  3. PAF (paroxysmal atrial fibrillation) (HCC) - CMP14+EGFR; Future - CBC with Differential/Platelet; Future  4. Other emphysema ([place]) - CMP14+EGFR; Future - CBC with Differential/Platelet; Future  5. Trigeminal neuralgia - CMP14+EGFR; Future - CBC with Differential/Platelet; Future  6. Moderate episode of recurrent major depressive disorder (HCC) - CMP14+EGFR; Future - CBC with Differential/Platelet; Future  7. H/O: CVA (cerebrovascular accident) - CMP14+EGFR; Future - CBC with Differential/Platelet; Future  8. Hyperlipidemia, unspecified hyperlipidemia type - CMP14+EGFR; Future - CBC with Differential/Platelet; Future  9. Late effect of cerebrovascular accident (CVA) - CMP14+EGFR; Future - CBC with Differential/Platelet; Future  10. Tobacco use - CMP14+EGFR; Future - CBC with Differential/Platelet; Future  11. Type 2 diabetes mellitus with diabetic polyneuropathy, without long-term current use of insulin (HCC) - Bayer DCA Hb A1c Waived; Future - CMP14+EGFR; Future - CBC with Differential/Platelet; Future   Labs pending Health Maintenance reviewed Diet and exercise encouraged  Follow up plan: 3 months      I discussed the assessment and treatment plan with the patient. The patient was provided  an opportunity to ask questions and all were answered. The patient agreed with the plan and demonstrated an understanding of the instructions.   The patient was advised to call back or seek an in-person evaluation if the symptoms worsen or if the condition fails to improve as anticipated.  The above assessment and management plan was discussed with the patient. The patient verbalized understanding of and has agreed to the management plan. Patient is aware to call the clinic if symptoms persist or worsen. Patient is aware when to return to the clinic for a follow-up visit. Patient educated on when it is appropriate to go to the emergency department.   Time call ended:  10:33 AM  I provided 19 minutes of non-face-to-face time during this encounter.    Evelina Dun, FNP

## 2019-06-04 ENCOUNTER — Other Ambulatory Visit: Payer: Self-pay | Admitting: Family

## 2019-06-04 DIAGNOSIS — G500 Trigeminal neuralgia: Secondary | ICD-10-CM

## 2019-06-16 ENCOUNTER — Other Ambulatory Visit: Payer: Self-pay

## 2019-06-16 ENCOUNTER — Other Ambulatory Visit: Payer: Medicare Other

## 2019-06-16 DIAGNOSIS — I4891 Unspecified atrial fibrillation: Secondary | ICD-10-CM

## 2019-06-16 DIAGNOSIS — I10 Essential (primary) hypertension: Secondary | ICD-10-CM

## 2019-06-16 DIAGNOSIS — G500 Trigeminal neuralgia: Secondary | ICD-10-CM

## 2019-06-16 DIAGNOSIS — E1142 Type 2 diabetes mellitus with diabetic polyneuropathy: Secondary | ICD-10-CM | POA: Diagnosis not present

## 2019-06-16 DIAGNOSIS — I6930 Unspecified sequelae of cerebral infarction: Secondary | ICD-10-CM | POA: Diagnosis not present

## 2019-06-16 DIAGNOSIS — Z720 Tobacco use: Secondary | ICD-10-CM

## 2019-06-16 DIAGNOSIS — Z8673 Personal history of transient ischemic attack (TIA), and cerebral infarction without residual deficits: Secondary | ICD-10-CM | POA: Diagnosis not present

## 2019-06-16 DIAGNOSIS — J438 Other emphysema: Secondary | ICD-10-CM

## 2019-06-16 DIAGNOSIS — I480 Paroxysmal atrial fibrillation: Secondary | ICD-10-CM

## 2019-06-16 DIAGNOSIS — E785 Hyperlipidemia, unspecified: Secondary | ICD-10-CM | POA: Diagnosis not present

## 2019-06-16 DIAGNOSIS — F331 Major depressive disorder, recurrent, moderate: Secondary | ICD-10-CM | POA: Diagnosis not present

## 2019-06-16 LAB — BAYER DCA HB A1C WAIVED: HB A1C (BAYER DCA - WAIVED): 5.5 % (ref <7.0)

## 2019-06-17 ENCOUNTER — Other Ambulatory Visit: Payer: Self-pay | Admitting: Family

## 2019-06-17 LAB — CMP14+EGFR
ALT: 15 IU/L (ref 0–44)
AST: 15 IU/L (ref 0–40)
Albumin/Globulin Ratio: 1.9 (ref 1.2–2.2)
Albumin: 4.6 g/dL (ref 3.8–4.9)
Alkaline Phosphatase: 93 IU/L (ref 39–117)
BUN/Creatinine Ratio: 17 (ref 9–20)
BUN: 12 mg/dL (ref 6–24)
Bilirubin Total: 0.4 mg/dL (ref 0.0–1.2)
CO2: 21 mmol/L (ref 20–29)
Calcium: 9 mg/dL (ref 8.7–10.2)
Chloride: 106 mmol/L (ref 96–106)
Creatinine, Ser: 0.7 mg/dL — ABNORMAL LOW (ref 0.76–1.27)
GFR calc Af Amer: 119 mL/min/{1.73_m2} (ref >59)
GFR calc non Af Amer: 103 mL/min/{1.73_m2} (ref >59)
Globulin, Total: 2.4 g/dL (ref 1.5–4.5)
Glucose: 122 mg/dL — ABNORMAL HIGH (ref 65–99)
Potassium: 3.9 mmol/L (ref 3.5–5.2)
Sodium: 141 mmol/L (ref 134–144)
Total Protein: 7 g/dL (ref 6.0–8.5)

## 2019-06-17 LAB — CBC WITH DIFFERENTIAL/PLATELET
Basophils Absolute: 0.1 10*3/uL (ref 0.0–0.2)
Basos: 1 %
EOS (ABSOLUTE): 0.3 10*3/uL (ref 0.0–0.4)
Eos: 3 %
Hematocrit: 43.8 % (ref 37.5–51.0)
Hemoglobin: 14.5 g/dL (ref 13.0–17.7)
Immature Grans (Abs): 0 10*3/uL (ref 0.0–0.1)
Immature Granulocytes: 0 %
Lymphocytes Absolute: 2 10*3/uL (ref 0.7–3.1)
Lymphs: 22 %
MCH: 28.9 pg (ref 26.6–33.0)
MCHC: 33.1 g/dL (ref 31.5–35.7)
MCV: 87 fL (ref 79–97)
Monocytes Absolute: 0.6 10*3/uL (ref 0.1–0.9)
Monocytes: 6 %
Neutrophils Absolute: 6.4 10*3/uL (ref 1.4–7.0)
Neutrophils: 68 %
Platelets: 207 10*3/uL (ref 150–450)
RBC: 5.02 x10E6/uL (ref 4.14–5.80)
RDW: 13.1 % (ref 11.6–15.4)
WBC: 9.3 10*3/uL (ref 3.4–10.8)

## 2019-06-17 MED ORDER — METFORMIN HCL 500 MG PO TABS: 500.0000 mg | ORAL_TABLET | Freq: Two times a day (BID) | ORAL | 11 refills | Status: DC

## 2019-06-18 ENCOUNTER — Other Ambulatory Visit: Payer: Self-pay | Admitting: Family

## 2019-07-10 IMAGING — DX DG LUMBAR SPINE COMPLETE 4+V
5 series · 5 of 5 positions shown · non-contrast
Comparison: 11/01/2017 CT abdomen and pelvis

CLINICAL DATA: Left hip pain radiating down leg.

EXAM:
LUMBAR SPINE - COMPLETE 4+ VIEW

[l-spine ap]
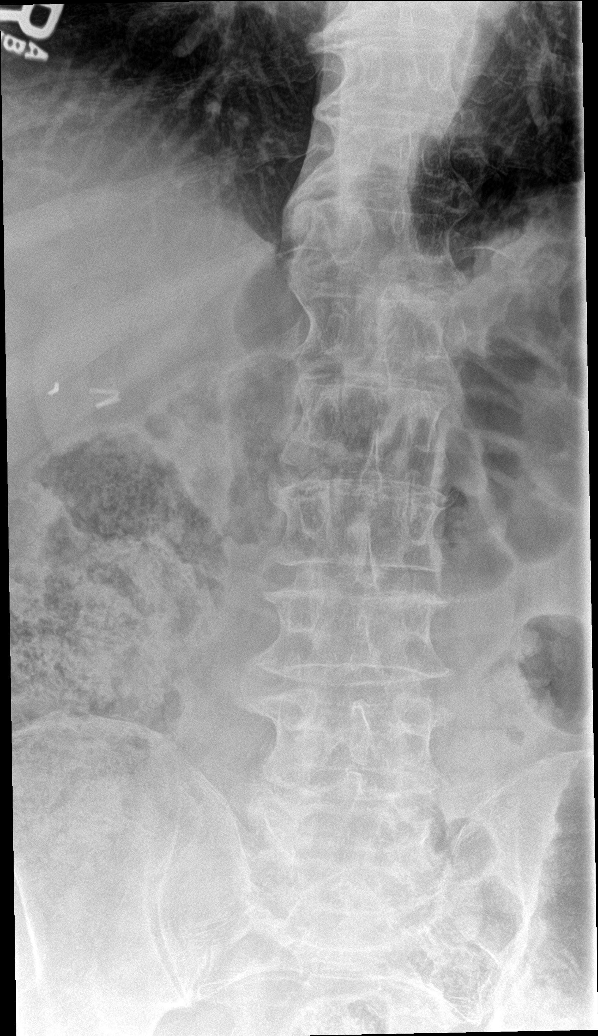

[l-spine obl (1 of 2)]
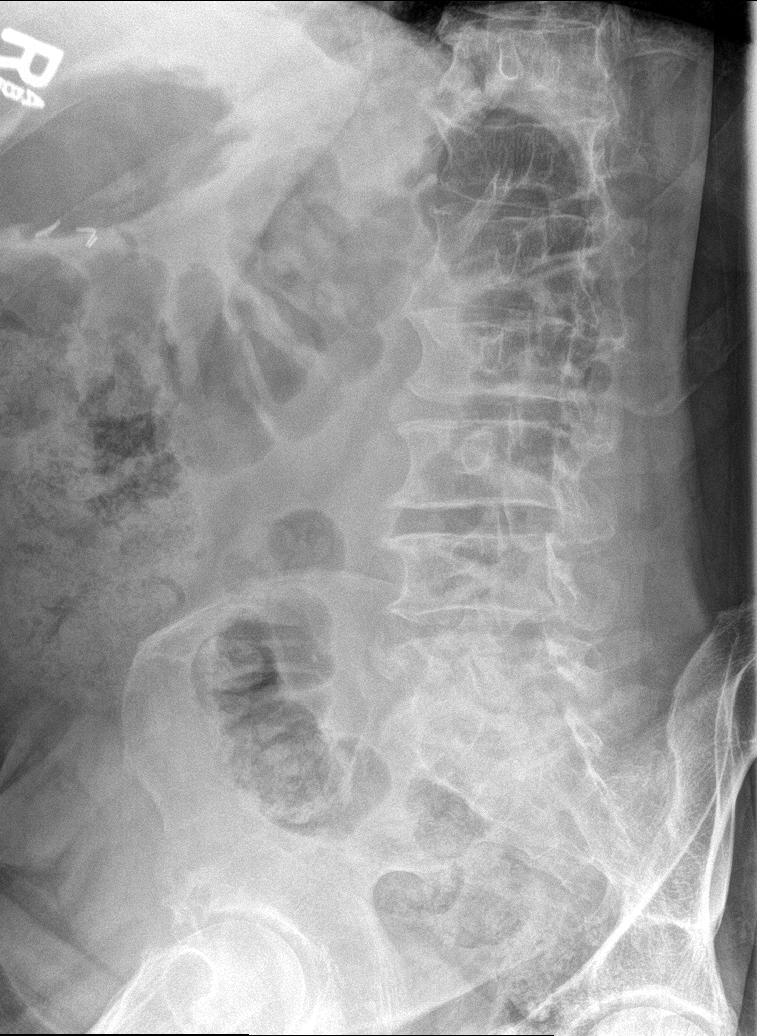

[l-spine obl (2 of 2)]
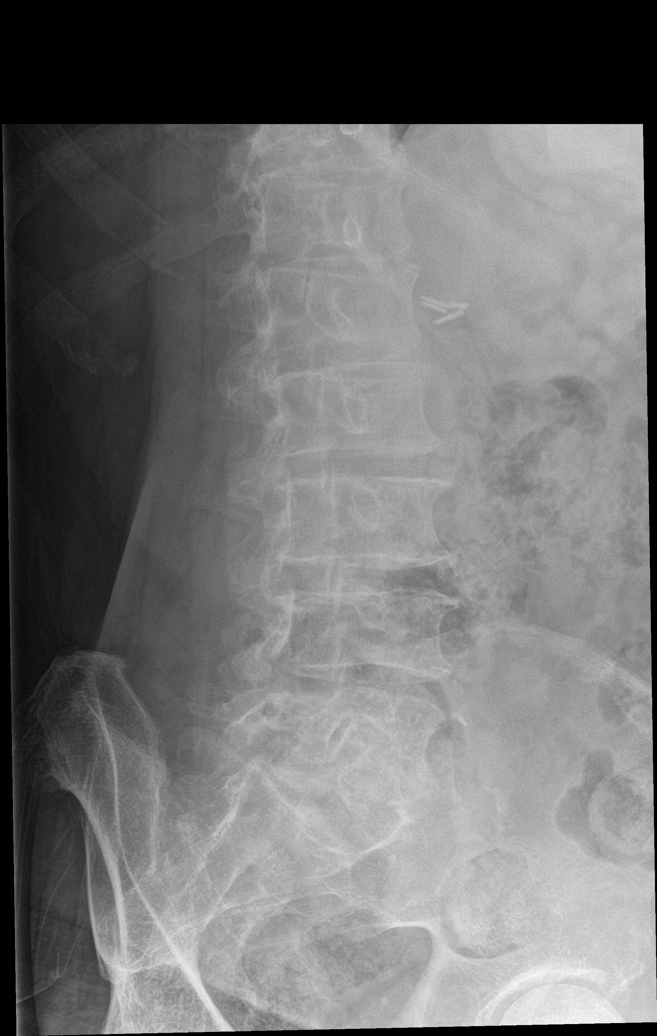

[l-spine lat]
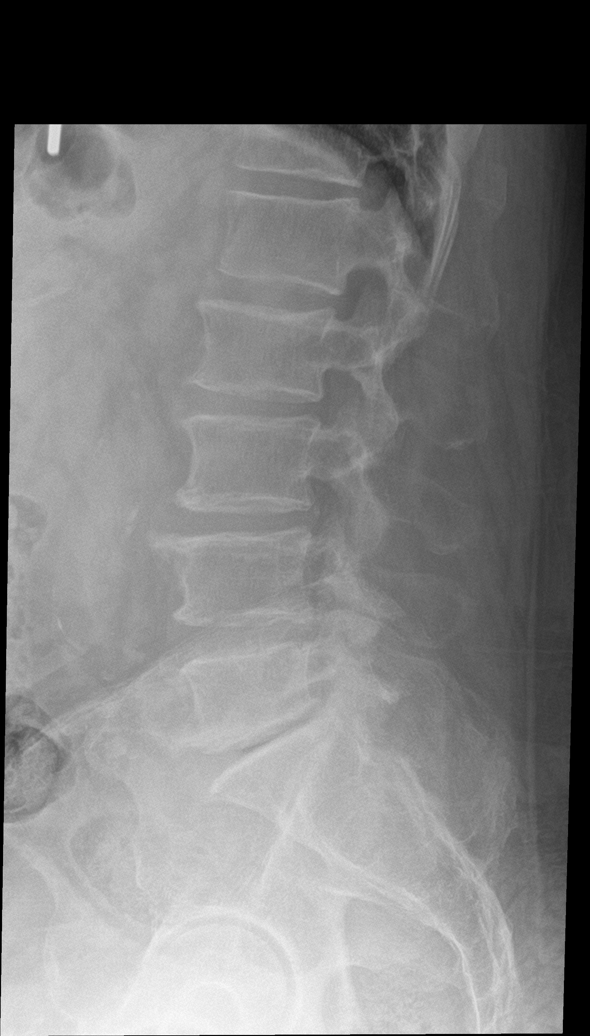

[l-spine spot]
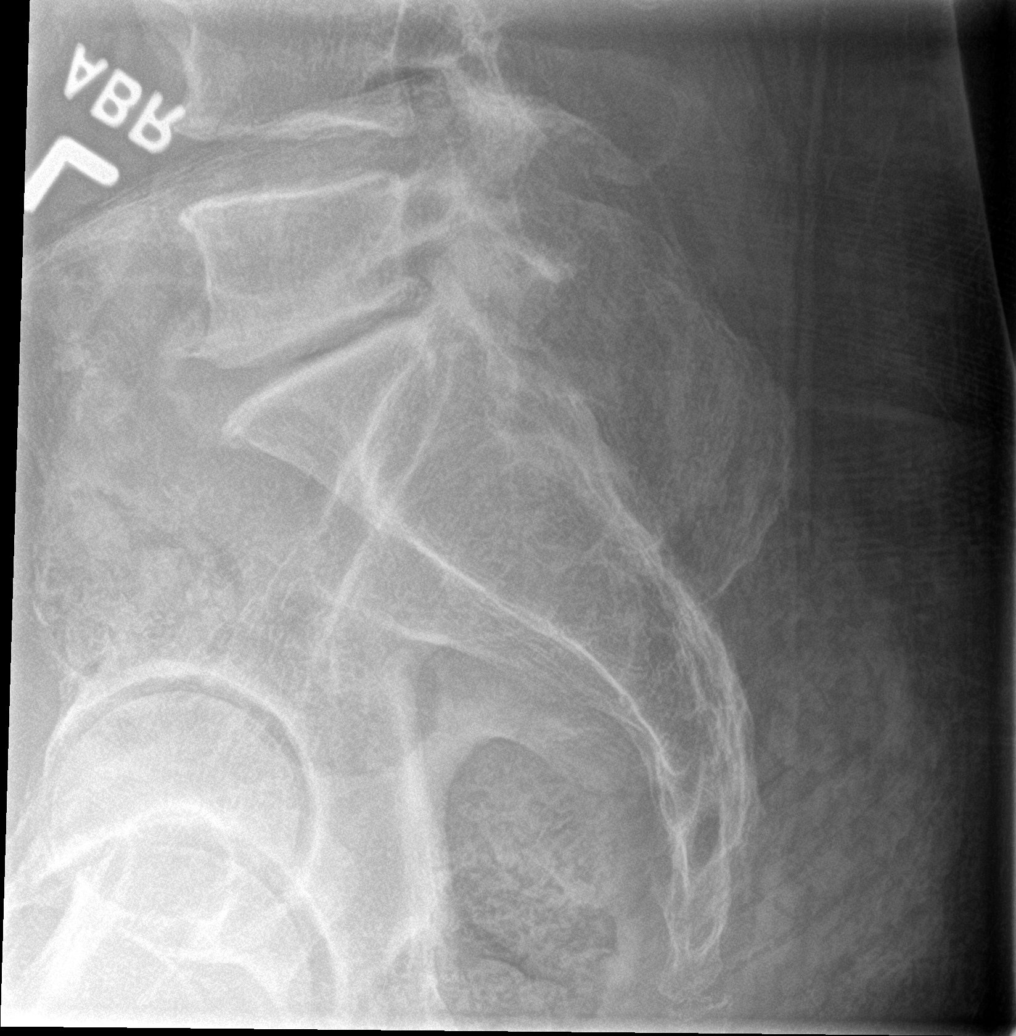

[5 of 5 positions shown; findings below may reference images not displayed]

FINDINGS: Five non ribbed lumbar vertebrae in lumbar lordosis. Mild disc
flattening identified at L2-3, L3-4 and to a greater extent L5-S1.
Facet arthrosis is noted of the lumbar spine greatest at L5-S1. No
pars defects or listhesis. No acute fracture. Cholecystectomy clips
are present.
IMPRESSION: Multilevel degenerative disc disease of the lumbar spine with facet
arthrosis greatest at L5-S1. No acute osseous abnormality.

## 2019-07-14 ENCOUNTER — Other Ambulatory Visit: Payer: Self-pay | Admitting: Family

## 2019-07-14 DIAGNOSIS — I10 Essential (primary) hypertension: Secondary | ICD-10-CM

## 2019-07-14 DIAGNOSIS — I4891 Unspecified atrial fibrillation: Secondary | ICD-10-CM

## 2019-07-16 ENCOUNTER — Encounter: Payer: Self-pay | Admitting: Family

## 2019-07-16 ENCOUNTER — Ambulatory Visit (INDEPENDENT_AMBULATORY_CARE_PROVIDER_SITE_OTHER): Payer: Medicare Other | Admitting: Family

## 2019-07-16 ENCOUNTER — Other Ambulatory Visit: Payer: Self-pay

## 2019-07-16 VITALS — BP 145/83 | HR 52 | Temp 98.2°F | Ht 69.0 in | Wt 217.0 lb

## 2019-07-16 DIAGNOSIS — I4891 Unspecified atrial fibrillation: Secondary | ICD-10-CM

## 2019-07-16 DIAGNOSIS — J438 Other emphysema: Secondary | ICD-10-CM | POA: Diagnosis not present

## 2019-07-16 DIAGNOSIS — F4323 Adjustment disorder with mixed anxiety and depressed mood: Secondary | ICD-10-CM | POA: Diagnosis not present

## 2019-07-16 DIAGNOSIS — E669 Obesity, unspecified: Secondary | ICD-10-CM | POA: Diagnosis not present

## 2019-07-16 DIAGNOSIS — I6930 Unspecified sequelae of cerebral infarction: Secondary | ICD-10-CM | POA: Diagnosis not present

## 2019-07-16 DIAGNOSIS — G500 Trigeminal neuralgia: Secondary | ICD-10-CM | POA: Diagnosis not present

## 2019-07-16 DIAGNOSIS — F331 Major depressive disorder, recurrent, moderate: Secondary | ICD-10-CM

## 2019-07-16 DIAGNOSIS — E785 Hyperlipidemia, unspecified: Secondary | ICD-10-CM | POA: Diagnosis not present

## 2019-07-16 DIAGNOSIS — E1142 Type 2 diabetes mellitus with diabetic polyneuropathy: Secondary | ICD-10-CM | POA: Diagnosis not present

## 2019-07-16 DIAGNOSIS — Z720 Tobacco use: Secondary | ICD-10-CM | POA: Diagnosis not present

## 2019-07-16 DIAGNOSIS — Z8673 Personal history of transient ischemic attack (TIA), and cerebral infarction without residual deficits: Secondary | ICD-10-CM | POA: Diagnosis not present

## 2019-07-16 DIAGNOSIS — I10 Essential (primary) hypertension: Secondary | ICD-10-CM | POA: Diagnosis not present

## 2019-07-16 MED ORDER — LEVETIRACETAM 500 MG PO TABS: 500.0000 mg | ORAL_TABLET | Freq: Two times a day (BID) | ORAL | 2 refills | Status: DC

## 2019-07-16 NOTE — Progress Notes (Signed)
Subjective:    Patient ID: Matthew [name], male    DOB: Jun 15, 1960, 59 y.o.   MRN: [id number]  Chief Complaint  Patient presents with   Follow-up   Pt presents to the office todayfor chronic follow up. He reports he has not seen his Cardiologists and Neurologists in "awhile". He reports his A Fib and Trigeminal neuralgia is stable. He has a hx of CVA with mild left sided weakness.   His last A1C on 06/16/19 was 5.5 and we decreased his metformin to 500 mg from 1000 mg.  Diabetes He presents for his follow-up diabetic visit. He has type 2 diabetes mellitus. His disease course has been stable. There are no hypoglycemic associated symptoms. Associated symptoms include foot paresthesias. Pertinent negatives for diabetes include no blurred vision. Symptoms are stable. Diabetic complications include peripheral neuropathy. Risk factors for coronary artery disease include dyslipidemia, diabetes mellitus, male sex, hypertension and sedentary lifestyle. He is following a generally healthy diet. His overall blood glucose range is 90-110 mg/dl.  Hypertension This is a chronic problem. The current episode started more than 1 year ago. The problem has been resolved since onset. The problem is controlled. Associated symptoms include malaise/fatigue and shortness of breath. Pertinent negatives include no blurred vision or peripheral edema. Risk factors for coronary artery disease include dyslipidemia, diabetes mellitus, obesity, male gender and sedentary lifestyle. The current treatment provides moderate improvement. Hypertensive end-organ damage includes CAD/MI.  Hyperlipidemia This is a chronic problem. The current episode started more than 1 year ago. The problem is controlled. Recent lipid tests were reviewed and are normal. Exacerbating diseases include obesity. Associated symptoms include shortness of breath. Current antihyperlipidemic treatment includes statins. The current treatment provides moderate  improvement of lipids. Risk factors for coronary artery disease include male sex, hypertension, a sedentary lifestyle, dyslipidemia and diabetes mellitus.  Nicotine Dependence Presents for follow-up visit. His urge triggers include company of smokers. He smokes 1 pack of cigarettes per day.      Review of Systems  Constitutional: Positive for malaise/fatigue.  Eyes: Negative for blurred vision.  Respiratory: Positive for shortness of breath.   All other systems reviewed and are negative.      Objective:   Physical Exam Vitals reviewed.  Constitutional:      General: He is not in acute distress.    Appearance: He is well-developed.  HENT:     Head: Normocephalic.     Right Ear: Tympanic membrane normal.     Left Ear: Tympanic membrane normal.  Eyes:     General:        Right eye: No discharge.        Left eye: No discharge.     Pupils: Pupils are equal, round, and reactive to light.  Neck:     Thyroid: No thyromegaly.  Cardiovascular:     Rate and Rhythm: Normal rate and regular rhythm.     Heart sounds: Normal heart sounds. No murmur.  Pulmonary:     Effort: Pulmonary effort is normal. No respiratory distress.     Breath sounds: Decreased breath sounds present. No wheezing.  Abdominal:     General: Bowel sounds are normal. There is no distension.     Palpations: Abdomen is soft.     Tenderness: There is no abdominal tenderness.  Musculoskeletal:        General: No tenderness. Normal range of motion.     Cervical back: Normal range of motion and neck supple.  Skin:  General: Skin is warm and dry.     Findings: No erythema or rash.  Neurological:     Mental Status: He is alert and oriented to person, place, and time.     Cranial Nerves: No cranial nerve deficit.     Deep Tendon Reflexes: Reflexes are normal and symmetric.  Psychiatric:        Behavior: Behavior normal.        Thought Content: Thought content normal.        Judgment: Judgment normal.        BP (!) 145/83    Pulse (!) 52    Temp 98.2 F (36.8 C) (Temporal)    Ht _0  (1.753 m)    Wt 217 lb (98.4 kg)    SpO2 96%    BMI 32.05 kg/m      Assessment & Plan:  Prentice Sackrider Simko comes in today with chief complaint of Follow-up   Diagnosis and orders addressed:  1. Atrial fibrillation with RVR (HCC) - BMP8+EGFR  2. Essential hypertension - BMP8+EGFR  3. Other emphysema (HCC) - BMP8+EGFR  4. Type 2 diabetes mellitus with diabetic polyneuropathy, without long-term current use of insulin (HCC) - BMP8+EGFR  5. Trigeminal neuralgia - BMP8+EGFR  6. Adjustment disorder with mixed anxiety and depressed mood - BMP8+EGFR  7. Moderate episode of recurrent major depressive disorder (HCC) - BMP8+EGFR  8. Hyperlipidemia, unspecified hyperlipidemia type - BMP8+EGFR  9. H/O: CVA (cerebrovascular accident) - BMP8+EGFR  10. Late effect of cerebrovascular accident (CVA) - BMP8+EGFR  11. Obesity (BMI 30-39.9) - BMP8+EGFR  12. Tobacco use - BMP8+EGFR   Labs pending Health Maintenance reviewed Diet and exercise encouraged  Follow up plan: 3 months    Evelina Dun, FNP

## 2019-07-16 NOTE — Patient Instructions (Signed)

## 2019-07-17 LAB — BMP8+EGFR
BUN/Creatinine Ratio: 17 (ref 9–20)
BUN: 11 mg/dL (ref 6–24)
CO2: 21 mmol/L (ref 20–29)
Calcium: 8.8 mg/dL (ref 8.7–10.2)
Chloride: 105 mmol/L (ref 96–106)
Creatinine, Ser: 0.64 mg/dL — ABNORMAL LOW (ref 0.76–1.27)
GFR calc Af Amer: 124 mL/min/{1.73_m2} (ref >59)
GFR calc non Af Amer: 107 mL/min/{1.73_m2} (ref >59)
Glucose: 93 mg/dL (ref 65–99)
Potassium: 4.1 mmol/L (ref 3.5–5.2)
Sodium: 140 mmol/L (ref 134–144)

## 2019-08-24 ENCOUNTER — Other Ambulatory Visit: Payer: Self-pay | Admitting: Family

## 2019-08-24 DIAGNOSIS — J441 Chronic obstructive pulmonary disease with (acute) exacerbation: Secondary | ICD-10-CM

## 2019-08-26 ENCOUNTER — Ambulatory Visit: Payer: Medicare Other | Admitting: Family

## 2019-09-06 ENCOUNTER — Other Ambulatory Visit: Payer: Self-pay | Admitting: Family

## 2019-09-06 DIAGNOSIS — G500 Trigeminal neuralgia: Secondary | ICD-10-CM

## 2019-09-19 ENCOUNTER — Other Ambulatory Visit: Payer: Self-pay | Admitting: Family

## 2019-09-19 DIAGNOSIS — G500 Trigeminal neuralgia: Secondary | ICD-10-CM

## 2019-09-19 DIAGNOSIS — E785 Hyperlipidemia, unspecified: Secondary | ICD-10-CM

## 2019-10-14 ENCOUNTER — Other Ambulatory Visit: Payer: Self-pay | Admitting: Family

## 2019-10-14 ENCOUNTER — Ambulatory Visit (INDEPENDENT_AMBULATORY_CARE_PROVIDER_SITE_OTHER): Payer: Medicare Other | Admitting: Family

## 2019-10-14 ENCOUNTER — Encounter: Payer: Self-pay | Admitting: Family

## 2019-10-14 ENCOUNTER — Other Ambulatory Visit (HOSPITAL_COMMUNITY)
Admission: RE | Admit: 2019-10-14 | Discharge: 2019-10-14 | Disposition: A | Discharge status: Home / Self Care | Payer: Medicare Other | Source: Ambulatory Visit | Attending: Family | Admitting: Family

## 2019-10-14 ENCOUNTER — Other Ambulatory Visit: Payer: Self-pay

## 2019-10-14 VITALS — BP 117/69 | HR 47 | Temp 98.3°F | Ht 69.0 in | Wt 207.2 lb

## 2019-10-14 DIAGNOSIS — I6930 Unspecified sequelae of cerebral infarction: Secondary | ICD-10-CM | POA: Diagnosis not present

## 2019-10-14 DIAGNOSIS — I480 Paroxysmal atrial fibrillation: Secondary | ICD-10-CM | POA: Diagnosis not present

## 2019-10-14 DIAGNOSIS — E669 Obesity, unspecified: Secondary | ICD-10-CM | POA: Diagnosis not present

## 2019-10-14 DIAGNOSIS — I10 Essential (primary) hypertension: Secondary | ICD-10-CM | POA: Diagnosis not present

## 2019-10-14 DIAGNOSIS — J438 Other emphysema: Secondary | ICD-10-CM | POA: Diagnosis not present

## 2019-10-14 DIAGNOSIS — E1142 Type 2 diabetes mellitus with diabetic polyneuropathy: Secondary | ICD-10-CM | POA: Diagnosis not present

## 2019-10-14 DIAGNOSIS — J441 Chronic obstructive pulmonary disease with (acute) exacerbation: Secondary | ICD-10-CM

## 2019-10-14 DIAGNOSIS — E785 Hyperlipidemia, unspecified: Secondary | ICD-10-CM | POA: Diagnosis not present

## 2019-10-14 DIAGNOSIS — Z8673 Personal history of transient ischemic attack (TIA), and cerebral infarction without residual deficits: Secondary | ICD-10-CM | POA: Diagnosis not present

## 2019-10-14 DIAGNOSIS — I4891 Unspecified atrial fibrillation: Secondary | ICD-10-CM | POA: Diagnosis not present

## 2019-10-14 DIAGNOSIS — R300 Dysuria: Secondary | ICD-10-CM

## 2019-10-14 DIAGNOSIS — G500 Trigeminal neuralgia: Secondary | ICD-10-CM | POA: Diagnosis not present

## 2019-10-14 DIAGNOSIS — Z720 Tobacco use: Secondary | ICD-10-CM | POA: Diagnosis not present

## 2019-10-14 LAB — BAYER DCA HB A1C WAIVED: HB A1C (BAYER DCA - WAIVED): 5.7 % (ref <7.0)

## 2019-10-14 NOTE — Progress Notes (Signed)
Subjective:    Patient ID: Matthew [name], male    DOB: 1960/04/21, 59 y.o.   MRN: [id number]  Chief Complaint  Patient presents with  . Medical Management of Chronic Issues  . Hypertension  . Atrial Fibrillation  . Diabetes   Pt presents to the office todayfor chronic follow up.He reports he has not seen his Cardiologists and Neurologists in "awhile". He reports his A Fib and Trigeminal neuralgia is stable.He has a hx of CVA with mild left sided weakness.   His last A1C on 06/16/19 was 5.5 and we decreased his metformin to 500 mg from 1000 mg.  Hypertension This is a chronic problem. The current episode started more than 1 year ago. The problem has been resolved since onset. The problem is controlled. Pertinent negatives include no blurred vision, malaise/fatigue, peripheral edema or shortness of breath. Risk factors for coronary artery disease include obesity, male gender, sedentary lifestyle, smoking/tobacco exposure, dyslipidemia and diabetes mellitus. The current treatment provides moderate improvement. There is no history of CVA or heart failure.  Atrial Fibrillation Presents for follow-up visit. Symptoms include hypertension. Symptoms are negative for shortness of breath. The symptoms have been stable. Past medical history includes atrial fibrillation.  Diabetes He presents for his follow-up diabetic visit. He has type 2 diabetes mellitus. His disease course has been stable. There are no hypoglycemic associated symptoms. Associated symptoms include foot paresthesias. Pertinent negatives for diabetes include no blurred vision and no visual change. Symptoms are stable. Diabetic complications include peripheral neuropathy. Pertinent negatives for diabetic complications include no CVA. Risk factors for coronary artery disease include diabetes mellitus and hypertension. He is following a generally healthy diet. His overall blood glucose range is 70-90 mg/dl. An ACE inhibitor/angiotensin II  receptor blocker is being taken. Eye exam is not current.  Nicotine Dependence Presents for follow-up visit. His urge triggers include company of smokers. He smokes 1 pack of cigarettes per day. Compliance with prior treatments has been good.  Dysuria  This is a new problem. The current episode started 1 to 4 weeks ago. The problem occurs intermittently. The quality of the pain is described as burning. The pain is at a severity of 1/10. Associated symptoms include hematuria. He has tried increased fluids for the symptoms. The treatment provided no relief.      Review of Systems  Constitutional: Negative for malaise/fatigue.  Eyes: Negative for blurred vision.  Respiratory: Negative for shortness of breath.   Genitourinary: Positive for dysuria and hematuria.  All other systems reviewed and are negative.      Objective:   Physical Exam Vitals reviewed.  Constitutional:      General: He is not in acute distress.    Appearance: He is well-developed. He is obese.  HENT:     Head: Normocephalic.     Right Ear: Tympanic membrane normal.     Left Ear: Tympanic membrane normal.  Eyes:     General:        Right eye: No discharge.        Left eye: No discharge.     Pupils: Pupils are equal, round, and reactive to light.  Neck:     Thyroid: No thyromegaly.  Cardiovascular:     Rate and Rhythm: Normal rate and regular rhythm.     Heart sounds: Normal heart sounds. No murmur heard.   Pulmonary:     Effort: Pulmonary effort is normal. No respiratory distress.     Breath sounds: Decreased breath sounds and  wheezing present.  Abdominal:     General: Bowel sounds are normal. There is no distension.     Palpations: Abdomen is soft.     Tenderness: There is no abdominal tenderness.  Musculoskeletal:        General: No tenderness. Normal range of motion.     Cervical back: Normal range of motion and neck supple.  Skin:    General: Skin is warm and dry.     Findings: No erythema or  rash.  Neurological:     Mental Status: He is alert and oriented to person, place, and time.     Cranial Nerves: No cranial nerve deficit.     Deep Tendon Reflexes: Reflexes are normal and symmetric.  Psychiatric:        Behavior: Behavior normal.        Thought Content: Thought content normal.        Judgment: Judgment normal.       BP 117/69   Pulse (!) 47   Temp 98.3 F (36.8 C) (Temporal)   Ht 5\' 9"  (1.753 m)   Wt 207 lb 3.2 oz (94 kg)   SpO2 95%   BMI 30.60 kg/m      Assessment & Plan:  Matthew [name] comes in today with chief complaint of Medical Management of Chronic Issues, Hypertension, Atrial Fibrillation, and Diabetes   Diagnosis and orders addressed:  1. Type 2 diabetes mellitus with diabetic polyneuropathy, without long-term current use of insulin (HCC) - Bayer DCA Hb A1c Waived  2. Atrial fibrillation with RVR ([place]  3. Essential hypertension   4. PAF (paroxysmal atrial fibrillation) ([place])  5. Other emphysema ([place])  6. Trigeminal neuralgia  7. H/O: CVA (cerebrovascular accident)  8. Hyperlipidemia, unspecified hyperlipidemia type  9. Late effect of cerebrovascular accident (CVA)  68. Obesity (BMI 30-39.9)  11. Tobacco use  12. Dysuria - Urinalysis, Complete - Urine Culture - GC/Chlamydia probe amp ([place])not at [place] Maintenance reviewed Diet and exercise encouraged  Follow up plan: 3 months   Matthew Dun, FNP

## 2019-10-14 NOTE — Patient Instructions (Signed)

## 2019-10-15 LAB — URINALYSIS, COMPLETE
Bilirubin, UA: NEGATIVE
Glucose, UA: NEGATIVE
Ketones, UA: NEGATIVE
Nitrite, UA: POSITIVE — AB
Specific Gravity, UA: 1.016 (ref 1.005–1.030)
Urobilinogen, Ur: 1 mg/dL (ref 0.2–1.0)
pH, UA: 6 (ref 5.0–7.5)

## 2019-10-15 LAB — MICROSCOPIC EXAMINATION
Casts: NONE SEEN /lpf
RBC, Urine: >30 /hpf — AB (ref 0–2)
WBC, UA: >30 /hpf — AB (ref 0–5)

## 2019-10-15 LAB — GC/CHLAMYDIA PROBE AMP (~~LOC~~) NOT AT ARMC
Chlamydia: NEGATIVE
Comment: NEGATIVE
Comment: NORMAL
Neisseria Gonorrhea: NEGATIVE

## 2019-10-16 ENCOUNTER — Other Ambulatory Visit: Payer: Self-pay | Admitting: Family

## 2019-10-16 LAB — URINE CULTURE

## 2019-10-16 MED ORDER — CIPROFLOXACIN HCL 500 MG PO TABS: 500.0000 mg | ORAL_TABLET | Freq: Two times a day (BID) | ORAL | 0 refills | Status: DC

## 2019-10-17 ENCOUNTER — Telehealth: Payer: Self-pay | Admitting: Family

## 2019-10-17 NOTE — Telephone Encounter (Signed)
Pt aware of lab results 

## 2019-10-17 NOTE — Telephone Encounter (Signed)
Vmb not set up  

## 2019-11-11 ENCOUNTER — Other Ambulatory Visit: Payer: Self-pay | Admitting: Family

## 2019-11-11 DIAGNOSIS — G500 Trigeminal neuralgia: Secondary | ICD-10-CM

## 2019-11-11 NOTE — Telephone Encounter (Signed)
Gabapentin Rx sent to pharmacy per Evelina Dun, FNP

## 2019-11-18 ENCOUNTER — Ambulatory Visit (INDEPENDENT_AMBULATORY_CARE_PROVIDER_SITE_OTHER): Payer: Medicare Other | Admitting: *Deleted

## 2019-11-18 ENCOUNTER — Other Ambulatory Visit: Payer: Self-pay

## 2019-11-18 DIAGNOSIS — Z23 Encounter for immunization: Secondary | ICD-10-CM

## 2019-12-10 ENCOUNTER — Other Ambulatory Visit: Payer: Self-pay | Admitting: Family

## 2019-12-10 DIAGNOSIS — J441 Chronic obstructive pulmonary disease with (acute) exacerbation: Secondary | ICD-10-CM

## 2019-12-15 ENCOUNTER — Other Ambulatory Visit: Payer: Self-pay | Admitting: Family

## 2019-12-15 DIAGNOSIS — I10 Essential (primary) hypertension: Secondary | ICD-10-CM

## 2019-12-15 DIAGNOSIS — E785 Hyperlipidemia, unspecified: Secondary | ICD-10-CM

## 2020-01-12 ENCOUNTER — Ambulatory Visit (INDEPENDENT_AMBULATORY_CARE_PROVIDER_SITE_OTHER): Payer: Medicare Other | Admitting: Family

## 2020-01-12 ENCOUNTER — Other Ambulatory Visit: Payer: Self-pay

## 2020-01-12 ENCOUNTER — Encounter: Payer: Self-pay | Admitting: Family

## 2020-01-12 VITALS — BP 120/62 | HR 52 | Temp 98.2°F | Ht 69.0 in | Wt 212.0 lb

## 2020-01-12 DIAGNOSIS — J441 Chronic obstructive pulmonary disease with (acute) exacerbation: Secondary | ICD-10-CM | POA: Diagnosis not present

## 2020-01-12 DIAGNOSIS — E1142 Type 2 diabetes mellitus with diabetic polyneuropathy: Secondary | ICD-10-CM | POA: Diagnosis not present

## 2020-01-12 DIAGNOSIS — M51369 Other intervertebral disc degeneration, lumbar region without mention of lumbar back pain or lower extremity pain: Secondary | ICD-10-CM

## 2020-01-12 DIAGNOSIS — I10 Essential (primary) hypertension: Secondary | ICD-10-CM

## 2020-01-12 DIAGNOSIS — I4891 Unspecified atrial fibrillation: Secondary | ICD-10-CM | POA: Diagnosis not present

## 2020-01-12 DIAGNOSIS — E785 Hyperlipidemia, unspecified: Secondary | ICD-10-CM | POA: Diagnosis not present

## 2020-01-12 DIAGNOSIS — I700 Atherosclerosis of aorta: Secondary | ICD-10-CM

## 2020-01-12 DIAGNOSIS — I2510 Atherosclerotic heart disease of native coronary artery without angina pectoris: Secondary | ICD-10-CM | POA: Diagnosis not present

## 2020-01-12 DIAGNOSIS — M5136 Other intervertebral disc degeneration, lumbar region: Secondary | ICD-10-CM | POA: Diagnosis not present

## 2020-01-12 DIAGNOSIS — G500 Trigeminal neuralgia: Secondary | ICD-10-CM

## 2020-01-12 DIAGNOSIS — F331 Major depressive disorder, recurrent, moderate: Secondary | ICD-10-CM

## 2020-01-12 DIAGNOSIS — I2584 Coronary atherosclerosis due to calcified coronary lesion: Secondary | ICD-10-CM | POA: Diagnosis not present

## 2020-01-12 DIAGNOSIS — Z8673 Personal history of transient ischemic attack (TIA), and cerebral infarction without residual deficits: Secondary | ICD-10-CM

## 2020-01-12 DIAGNOSIS — I6930 Unspecified sequelae of cerebral infarction: Secondary | ICD-10-CM | POA: Diagnosis not present

## 2020-01-12 DIAGNOSIS — Z720 Tobacco use: Secondary | ICD-10-CM

## 2020-01-12 DIAGNOSIS — E669 Obesity, unspecified: Secondary | ICD-10-CM

## 2020-01-12 LAB — BAYER DCA HB A1C WAIVED: HB A1C (BAYER DCA - WAIVED): 5.5 % (ref <7.0)

## 2020-01-12 MED ORDER — GABAPENTIN 800 MG PO TABS: 800.0000 mg | ORAL_TABLET | Freq: Three times a day (TID) | ORAL | 2 refills | Status: DC

## 2020-01-12 MED ORDER — METFORMIN HCL 500 MG PO TABS: 500.0000 mg | ORAL_TABLET | Freq: Every day | ORAL | 4 refills | Status: DC

## 2020-01-12 MED ORDER — GABAPENTIN 800 MG PO TABS: 800.0000 mg | ORAL_TABLET | Freq: Four times a day (QID) | ORAL | 3 refills | Status: DC

## 2020-01-12 MED ORDER — ALBUTEROL SULFATE HFA 108 (90 BASE) MCG/ACT IN AERS: 2.0000 | INHALATION_SPRAY | RESPIRATORY_TRACT | 2 refills | Status: DC | PRN

## 2020-01-12 MED ORDER — APIXABAN 5 MG PO TABS: 5.0000 mg | ORAL_TABLET | Freq: Two times a day (BID) | ORAL | 1 refills | Status: DC

## 2020-01-12 NOTE — Progress Notes (Signed)
Subjective:    Patient ID: Matthew [name], male    DOB: March 26, 1960, 59 y.o.   MRN: [id number]  Chief Complaint  Patient presents with  . Diabetes   Pt presents to the office todayfor chronic follow up.He reports he has not seen his Cardiologists and Neurologists in "awhile". He reports his A Fib and Trigeminal neuralgia is stable.He has a hx of CVA with mild left sided weakness.  Diabetes He presents for his follow-up diabetic visit. He has type 2 diabetes mellitus. There are no hypoglycemic associated symptoms. Associated symptoms include foot paresthesias. Pertinent negatives for diabetes include no blurred vision. Symptoms are stable. Diabetic complications include a CVA, heart disease, nephropathy and peripheral neuropathy. Risk factors for coronary artery disease include dyslipidemia, diabetes mellitus, male sex, hypertension and sedentary lifestyle. He is following a diabetic diet. His overall blood glucose range is 110-130 mg/dl. An ACE inhibitor/angiotensin II receptor blocker is being taken.  Hypertension This is a chronic problem. The current episode started more than 1 year ago. The problem has been resolved since onset. The problem is controlled. Associated symptoms include malaise/fatigue. Pertinent negatives include no blurred vision or peripheral edema. Risk factors for coronary artery disease include dyslipidemia, diabetes mellitus, obesity, male gender, sedentary lifestyle and smoking/tobacco exposure. The current treatment provides moderate improvement. Hypertensive end-organ damage includes CVA.  Nicotine Dependence Presents for follow-up visit. His urge triggers include company of smokers. He smokes 1 pack of cigarettes per day.  Hyperlipidemia This is a chronic problem. The current episode started more than 1 year ago. The problem is controlled. Recent lipid tests were reviewed and are normal. Exacerbating diseases include obesity. Current antihyperlipidemic treatment includes  statins. The current treatment provides moderate improvement of lipids.  Depression        This is a chronic problem.  The current episode started more than 1 year ago.   The onset quality is sudden.   The problem occurs intermittently.  The problem has been waxing and waning since onset.  Associated symptoms include restlessness and sad.  Associated symptoms include no helplessness and no hopelessness.  Past treatments include SNRIs - Serotonin and norepinephrine reuptake inhibitors.  COPD States this is stable. Continues to smoke 1 1/2 pack a day.    Review of Systems  Constitutional: Positive for malaise/fatigue.  Eyes: Negative for blurred vision.  Psychiatric/Behavioral: Positive for depression.  All other systems reviewed and are negative.      Objective:   Physical Exam Vitals reviewed.  Constitutional:      General: He is not in acute distress.    Appearance: He is well-developed.  HENT:     Head: Normocephalic.     Right Ear: Tympanic membrane normal.     Left Ear: Tympanic membrane normal.  Eyes:     General:        Right eye: No discharge.        Left eye: No discharge.     Pupils: Pupils are equal, round, and reactive to light.  Neck:     Thyroid: No thyromegaly.  Cardiovascular:     Rate and Rhythm: Normal rate and regular rhythm.     Heart sounds: Normal heart sounds. No murmur heard.   Pulmonary:     Effort: Pulmonary effort is normal. No respiratory distress.     Breath sounds: Normal breath sounds. No wheezing.  Abdominal:     General: Bowel sounds are normal. There is no distension.     Palpations: Abdomen is  soft.     Tenderness: There is no abdominal tenderness.  Musculoskeletal:        General: No tenderness. Normal range of motion.     Cervical back: Normal range of motion and neck supple.  Skin:    General: Skin is warm and dry.     Findings: No erythema or rash.  Neurological:     Mental Status: He is alert and oriented to person, place, and  time.     Cranial Nerves: No cranial nerve deficit.     Deep Tendon Reflexes: Reflexes are normal and symmetric.  Psychiatric:        Behavior: Behavior normal.        Thought Content: Thought content normal.        Judgment: Judgment normal.     Diabetic Foot Exam - Simple   Simple Foot Form Diabetic Foot exam was performed with the following findings: Yes 01/12/2020 11:56 AM  Visual Inspection No deformities, no ulcerations, no other skin breakdown bilaterally: Yes Sensation Testing Intact to touch and monofilament testing bilaterally: Yes Pulse Check Posterior Tibialis and Dorsalis pulse intact bilaterally: Yes Comments      BP 120/62   Pulse (!) 52   Temp 98.2 F (36.8 C) (Temporal)   Ht $R'5\' 9"'IE$  (1.753 m)   Wt 212 lb (96.2 kg)   SpO2 96%   BMI 31.31 kg/m      Assessment & Plan:  Matthew [name] comes in today with chief complaint of Diabetes   Diagnosis and orders addressed:  1. Type 2 diabetes mellitus with diabetic polyneuropathy, without long-term current use of insulin (HCC) -Will decrease Metformin to 500 mg daily from BID - Bayer DCA Hb A1c Waived - Microalbumin / creatinine urine ratio - CMP14+EGFR - CBC with Differential/Platelet  2. Atrial fibrillation with RVR (HCC) - apixaban (ELIQUIS) 5 MG TABS tablet; Take 1 tablet (5 mg total) by mouth 2 (two) times daily.  Dispense: 180 tablet; Refill: 1 - CMP14+EGFR - CBC with Differential/Platelet  3. Chronic obstructive pulmonary disease with acute exacerbation (HCC) - albuterol (VENTOLIN HFA) 108 (90 Base) MCG/ACT inhaler; Inhale 2 puffs into the lungs every 4 (four) hours as needed for wheezing or shortness of breath.  Dispense: 18 each; Refill: 2 - CMP14+EGFR - CBC with Differential/Platelet  4. COPD exacerbation (HCC) - albuterol (VENTOLIN HFA) 108 (90 Base) MCG/ACT inhaler; Inhale 2 puffs into the lungs every 4 (four) hours as needed for wheezing or shortness of breath.  Dispense: 18 each; Refill: 2 -  CMP14+EGFR - CBC with Differential/Platelet  5. Trigeminal neuralgia -Will increase gabapentin to QID from TID - CMP14+EGFR - CBC with Differential/Platelet - gabapentin (NEURONTIN) 800 MG tablet; Take 1 tablet (800 mg total) by mouth in the morning, at noon, in the evening, and at bedtime.  Dispense: 120 tablet; Refill: 3  6. Coronary artery calcification - CMP14+EGFR - CBC with Differential/Platelet  7. Primary hypertension - CMP14+EGFR - CBC with Differential/Platelet  8. Thoracic aortic atherosclerosis (HCC) - CMP14+EGFR - CBC with Differential/Platelet  9. DDD (degenerative disc disease), lumbar - CMP14+EGFR - CBC with Differential/Platelet  10. Moderate episode of recurrent major depressive disorder (HCC) - CMP14+EGFR - CBC with Differential/Platelet  11. H/O: CVA (cerebrovascular accident) - CMP14+EGFR - CBC with Differential/Platelet  12. Hyperlipidemia, unspecified hyperlipidemia type - CMP14+EGFR - CBC with Differential/Platelet  13. Late effect of cerebrovascular accident (CVA) - CMP14+EGFR - CBC with Differential/Platelet  14. Obesity (BMI 30-39.9) - CMP14+EGFR - CBC with Differential/Platelet  15. Tobacco use - CMP14+EGFR - CBC with Differential/Platelet   Labs pending Health Maintenance reviewed Diet and exercise encouraged  Follow up plan: 4 months    Evelina Dun, FNP

## 2020-01-12 NOTE — Patient Instructions (Signed)
Trigeminal Neuralgia  Trigeminal neuralgia is a nerve disorder that causes severe pain on one side of the face. The pain may last from a few seconds to several minutes. The pain is usually only on one side of the face. Symptoms may occur for days, weeks, or months and then go away for months or years. The pain may return and be worse than before. What are the causes? This condition is caused by damage or pressure to a nerve in the head that is called the trigeminal nerve. An attack can be triggered by:  Talking.  Chewing.  Putting on makeup.  Washing your face.  Shaving your face.  Brushing your teeth.  Touching your face. What increases the risk? You are more likely to develop this condition if you:  Are 23 years of age or older.  Are male. What are the signs or symptoms? The main symptom of this condition is severe pain in the:  Jaw.  Lips.  Eyes.  Nose.  Scalp.  Forehead.  Face. The pain may be:  Intense.  Stabbing.  Electric.  Shock-like. How is this diagnosed? This condition is diagnosed with a physical exam. A CT scan or an MRI may be done to rule out other conditions that can cause facial pain. How is this treated? This condition may be treated with:  Avoiding the things that trigger your symptoms.  Taking prescription medicines (anticonvulsants).  Having surgery. This may be done in severe cases if other medical treatment does not provide relief.  Having procedures such as ablation, thermal, or radiation therapy. It may take up to one month for treatment to start relieving the pain. Follow these instructions at home: Managing pain  Learn as much as you can about how to manage your pain. Ask your health care provider if a pain specialist would be helpful.  Consider talking with a mental health care provider (psychologist) about how to cope with the pain.  Consider joining a pain support group. General instructions  Take  over-the-counter and prescription medicines only as told by your health care provider.  Avoid the things that trigger your symptoms. It may help to: ? Chew on the unaffected side of your mouth. ? Avoid touching your face. ? Avoid blasts of hot or cold air.  Follow your treatment plan as told by your health care provider. This may include: ? Cognitive or behavioral therapy. ? Gentle, regular exercise. ? Meditation or yoga. ? Aromatherapy.  Keep all follow-up visits as told by your health care provider. You may need to be monitored closely to make sure treatment is working well for you. Where to find more information  F[employer] Association: fpa-support.org Contact a health care provider if:  Your medicine is not helping your symptoms.  You have side effects from the medicine used for treatment.  You develop new, unexplained symptoms, such as: ? Double vision. ? Facial weakness. ? Facial numbness. ? Changes in hearing or balance.  You feel depressed. Get help right away if:  Your pain is severe and is not getting better.  You develop suicidal thoughts. If you ever feel like you may hurt yourself or others, or have thoughts about taking your own life, get help right away. You can go to your nearest emergency department or call:  Your local emergency services (911 in the [place]  A suicide crisis helpline, such as the [place] at 332-288-5135. This is open 24 hours a day. Summary  Trigeminal neuralgia is a  nerve disorder that causes severe pain on one side of the face. The pain may last from a few seconds to several minutes.  This condition is caused by damage or pressure to a nerve in the head that is called the trigeminal nerve.  Treatment may include avoiding the things that trigger your symptoms, taking medicines, or having surgery or procedures. It may take up to one month for treatment to start relieving the pain.  Avoid the things that  trigger your symptoms.  Keep all follow-up visits as told by your health care provider. You may need to be monitored closely to make sure treatment is working well for you. This information is not intended to replace advice given to you by your health care provider. Make sure you discuss any questions you have with your health care provider. Document Revised: 12/17/2017 Document Reviewed: 12/17/2017 Elsevier Patient Education  [place].

## 2020-01-13 LAB — CBC WITH DIFFERENTIAL/PLATELET
Basophils Absolute: 0.1 10*3/uL (ref 0.0–0.2)
Basos: 1 %
EOS (ABSOLUTE): 0.3 10*3/uL (ref 0.0–0.4)
Eos: 3 %
Hematocrit: 43.6 % (ref 37.5–51.0)
Hemoglobin: 14.8 g/dL (ref 13.0–17.7)
Immature Grans (Abs): 0 10*3/uL (ref 0.0–0.1)
Immature Granulocytes: 0 %
Lymphocytes Absolute: 2 10*3/uL (ref 0.7–3.1)
Lymphs: 26 %
MCH: 30.3 pg (ref 26.6–33.0)
MCHC: 33.9 g/dL (ref 31.5–35.7)
MCV: 89 fL (ref 79–97)
Monocytes Absolute: 0.6 10*3/uL (ref 0.1–0.9)
Monocytes: 7 %
Neutrophils Absolute: 4.9 10*3/uL (ref 1.4–7.0)
Neutrophils: 63 %
Platelets: 216 10*3/uL (ref 150–450)
RBC: 4.89 x10E6/uL (ref 4.14–5.80)
RDW: 12.7 % (ref 11.6–15.4)
WBC: 7.9 10*3/uL (ref 3.4–10.8)

## 2020-01-13 LAB — CMP14+EGFR
ALT: 16 IU/L (ref 0–44)
AST: 16 IU/L (ref 0–40)
Albumin/Globulin Ratio: 1.8 (ref 1.2–2.2)
Albumin: 4.6 g/dL (ref 3.8–4.9)
Alkaline Phosphatase: 98 IU/L (ref 44–121)
BUN/Creatinine Ratio: 15 (ref 10–24)
BUN: 11 mg/dL (ref 8–27)
Bilirubin Total: 0.4 mg/dL (ref 0.0–1.2)
CO2: 25 mmol/L (ref 20–29)
Calcium: 8.8 mg/dL (ref 8.6–10.2)
Chloride: 103 mmol/L (ref 96–106)
Creatinine, Ser: 0.72 mg/dL — ABNORMAL LOW (ref 0.76–1.27)
GFR calc Af Amer: 117 mL/min/{1.73_m2} (ref >59)
GFR calc non Af Amer: 101 mL/min/{1.73_m2} (ref >59)
Globulin, Total: 2.6 g/dL (ref 1.5–4.5)
Glucose: 106 mg/dL — ABNORMAL HIGH (ref 65–99)
Potassium: 4.2 mmol/L (ref 3.5–5.2)
Sodium: 141 mmol/L (ref 134–144)
Total Protein: 7.2 g/dL (ref 6.0–8.5)

## 2020-01-13 LAB — MICROALBUMIN / CREATININE URINE RATIO
Creatinine, Urine: 46 mg/dL
Microalb/Creat Ratio: 34 mg/g creat — ABNORMAL HIGH (ref 0–29)
Microalbumin, Urine: 15.6 ug/mL

## 2020-02-15 ENCOUNTER — Other Ambulatory Visit: Payer: Self-pay | Admitting: Family

## 2020-02-15 DIAGNOSIS — J441 Chronic obstructive pulmonary disease with (acute) exacerbation: Secondary | ICD-10-CM

## 2020-02-15 DIAGNOSIS — I10 Essential (primary) hypertension: Secondary | ICD-10-CM

## 2020-02-15 DIAGNOSIS — E785 Hyperlipidemia, unspecified: Secondary | ICD-10-CM

## 2020-02-19 ENCOUNTER — Telehealth: Payer: Self-pay

## 2020-02-19 NOTE — Telephone Encounter (Signed)
No answer no machine.  What does he use the mouthwash for?

## 2020-02-20 NOTE — Telephone Encounter (Signed)
No answer, no voicemail.

## 2020-02-24 ENCOUNTER — Telehealth: Payer: Self-pay

## 2020-02-24 MED ORDER — MAGIC MOUTHWASH W/LIDOCAINE: 10.0000 mL | Freq: Three times a day (TID) | ORAL | 2 refills | Status: DC | PRN

## 2020-02-24 NOTE — Telephone Encounter (Signed)
Patient states that he uses it due to mouth pain that has been going on.  States Alyse Low sends it in for him.  Please advise

## 2020-02-24 NOTE — Telephone Encounter (Signed)
Unable to reach patient, encounter closed °

## 2020-02-24 NOTE — Telephone Encounter (Signed)
Magic mouthwash Prescription written. Please fax to pharmacy.

## 2020-02-24 NOTE — Telephone Encounter (Signed)
Faxed

## 2020-03-22 ENCOUNTER — Ambulatory Visit (INDEPENDENT_AMBULATORY_CARE_PROVIDER_SITE_OTHER): Payer: Medicare Other

## 2020-03-22 DIAGNOSIS — Z0000 Encounter for general adult medical examination without abnormal findings: Secondary | ICD-10-CM | POA: Diagnosis not present

## 2020-03-22 NOTE — Progress Notes (Signed)
MEDICARE ANNUAL WELLNESS VISIT  03/22/2020  Telephone Visit Disclaimer This Medicare AWV was conducted by telephone due to national recommendations for restrictions regarding the COVID-19 Pandemic (e.g. social distancing).  I verified, using two identifiers, that I am speaking with Matthew [name] or their authorized healthcare agent. I discussed the limitations, risks, security, and privacy concerns of performing an evaluation and management service by telephone and the potential availability of an in-person appointment in the future. The patient expressed understanding and agreed to proceed.  Location of Patient: Home Location of Provider (nurse):  [facility]  Subjective:    Matthew [name] is a 60 y.o. male patient of Hawks, Theador Hawthorne, FNP who had a Medicare Annual Wellness Visit today via telephone. Matthew [name] is Disabled and lives alone. he has three children. he reports that he is socially active and does interact with friends/family regularly. he is minimally physically active and enjoys fishing.  Patient Care Team: [name], FNP as PCP - General (Nurse Practitioner) Satira Sark, MD as PCP - Cardiology (Cardiology)  Advanced Directives 03/22/2020 03/19/2019 09/02/2018 08/28/2018 08/28/2018 03/28/2018 03/21/2018  Does Patient Have a Medical Advance Directive? No No No No No No No  Would patient like information on creating a medical advance directive? No - Patient declined No - Patient declined No - Patient declined No - Patient declined No - Patient declined No - Patient declined No - Patient declined    Hospital Utilization Over the Past 12 Months: # of hospitalizations or ER visits: 1 # of surgeries: 1  Review of Systems    Patient reports that his overall health is unchanged compared to last year.  Patient Reported Readings (BP, Pulse, CBG-111, Weight, etc)   Pain Assessment Pain : 0-10 Pain Score: 9  Pain Type: Chronic pain Pain Location:  Mouth Pain Descriptors / Indicators: Constant Pain Onset: More than a month ago Pain Frequency: Constant Pain Relieving Factors: Ibuprofen  Pain Relieving Factors: Ibuprofen  Current Medications & Allergies (verified) Allergies as of 03/22/2020      Reactions   Penicillins Other (See Comments)   Did it involve swelling of the face/tongue/throat, SOB, or low BP? Unknown Did it involve sudden or severe rash/hives, skin peeling, or any reaction on the inside of your mouth or nose? Unknown Did you need to seek medical attention at a hospital or doctor's office? Unknown When did it last happen?childhood If all above answers are "NO", may proceed with cephalosporin use.      Medication List       Accurate as of March 22, 2020  8:58 AM. If you have any questions, ask your nurse or doctor.        albuterol 108 (90 Base) MCG/ACT inhaler Commonly known as: VENTOLIN HFA INHALE TWO PUFFS INTO LUNGS EVERY SIX HOURS AS NEEDED FOR WHEEZE OR SHORTNESS OF BREATH.   amLODipine 10 MG tablet Commonly known as: NORVASC TAKE 1 TABLET BY MOUTH EVERY DAY   apixaban 5 MG Tabs tablet Commonly known as: Eliquis Take 1 tablet (5 mg total) by mouth 2 (two) times daily.   atorvastatin 40 MG tablet Commonly known as: LIPITOR TAKE 1 TABLET BY MOUTH DAILY AT 6PM.   blood glucose meter kit and supplies Dispense based on patient and insurance preference. Use up to four times daily as directed. (FOR ICD-10 E10.9, E11.9).   carvedilol 3.125 MG tablet Commonly known as: COREG TAKE 1 TABLET (3.125 MG TOTAL) BY MOUTH 2 (TWO)  TIMES DAILY WITH A MEAL.   DULoxetine 60 MG capsule Commonly known as: CYMBALTA TAKE 1 CAPSULE BY MOUTH EVERY DAY   fluticasone 50 MCG/ACT nasal spray Commonly known as: FLONASE USE 2 SPRAYS INTO BOTH NOSTRILS ONCE DAILY   gabapentin 800 MG tablet Commonly known as: NEURONTIN Take 1 tablet (800 mg total) by mouth in the morning, at noon, in the evening, and at bedtime.    glucose blood test strip Test BS daily Dx E11.65   levETIRAcetam 500 MG tablet Commonly known as: KEPPRA Take 1 tablet (500 mg total) by mouth 2 (two) times daily.   lisinopril 5 MG tablet Commonly known as: ZESTRIL Take 1 tablet (5 mg total) by mouth daily.   loratadine 10 MG tablet Commonly known as: CLARITIN TAKE 1 TABLET BY MOUTH EVERY DAY   magic mouthwash w/lidocaine Soln Take 10 mLs by mouth 3 (three) times daily as needed for mouth pain.   metFORMIN 500 MG tablet Commonly known as: Glucophage Take 1 tablet (500 mg total) by mouth daily with breakfast.   Trelegy Ellipta 100-62.5-25 MCG/INH Aepb Generic drug: Fluticasone-Umeclidin-Vilant INHALE 1 PUFF INTO THE LUNGS DAILY   Vitamin D (Ergocalciferol) 1.25 MG (50000 UNIT) Caps capsule Commonly known as: DRISDOL Take 1 capsule (50,000 Units total) by mouth every 7 (seven) days.       History (reviewed): Past Medical History:  Diagnosis Date  . Abscess   . Anxiety   . Aphthous ulcer   . Arthritis   . Asthma   . Chronic bronchitis   . Chronic pain   . COPD (chronic obstructive pulmonary disease) ([place])   . CVA (cerebral vascular accident) ([place])   . Depression   . Hyperlipidemia   . Hypertension   . Trigeminal neuralgia   . Vertigo    Past Surgical History:  Procedure Laterality Date  . CHOLECYSTECTOMY    . HAND RECONSTRUCTION Right 1990's   Family History  Problem Relation Age of Onset  . Hypertension Mother   . Diabetes Mother   . Hypertension Father   . Diabetes Father   . Cancer Father   . Emphysema Father   . Early death Brother    Social History   Socioeconomic History  . Marital status: Legally Separated    Spouse name: Not on file  . Number of children: 3  . Years of education: 9  . Highest education level: 9th grade  Occupational History  . Occupation: Disabled  Tobacco Use  . Smoking status: Current Every Day Smoker    Packs/day: 2.00    Years: 40.00    Pack years: 80.00     Types: Cigarettes    Start date: 07/23/1975  . Smokeless tobacco: Never Used  Vaping Use  . Vaping Use: Never used  Substance and Sexual Activity  . Alcohol use: Not Currently    Comment: 08/21/2012 "quit drinking ~ 1985"  . Drug use: No  . Sexual activity: Yes  Other Topics Concern  . Not on file  Social History Narrative   Highest level of education: 9th grade   Social Determinants of Health   Financial Resource Strain: Not on file  Food Insecurity: Not on file  Transportation Needs: Not on file  Physical Activity: Not on file  Stress: Not on file  Social Connections: Not on file    Activities of Daily Living In your present state of health, do you have any difficulty performing the following activities: 03/22/2020  Hearing? N  Vision? N  Difficulty concentrating or making decisions? Y  Walking or climbing stairs? N  Dressing or bathing? N  Doing errands, shopping? N  Preparing Food and eating ? N  Using the Toilet? N  In the past six months, have you accidently leaked urine? N  Do you have problems with loss of bowel control? N  Managing your Medications? N  Managing your Finances? N  Housekeeping or managing your Housekeeping? N  Some recent data might be hidden    Patient Education/ Literacy How often do you need to have someone help you when you read instructions, pamphlets, or other written materials from your doctor or pharmacy?: 3 - Sometimes What is the last grade level you completed in school?: 9th grade  Exercise Current Exercise Habits: The patient does not participate in regular exercise at present;Home exercise routine, Type of exercise: walking, Time (Minutes): 20, Frequency (Times/Week): 5, Weekly Exercise (Minutes/Week): 100, Intensity: Mild, Exercise limited by: neurologic condition(s)  Diet Patient reports consuming 3 meals a day and 1 snack(s) a day Patient reports that his primary diet is: Regular Patient reports that she does have regular access  to food.   Depression Screen PHQ 2/9 Scores 03/22/2020 01/12/2020 10/14/2019 07/16/2019 03/19/2019 03/19/2019 02/26/2019  PHQ - 2 Score 0 0 0 0 0 0 0  PHQ- 9 Score - 0 0 0 0 0 0     Fall Risk Fall Risk  03/22/2020 01/12/2020 10/14/2019 07/16/2019 03/19/2019  Falls in the past year? 0 0 0 1 0  Comment - - - - -  Number falls in past yr: - - - 0 -  Injury with Fall? - - - 0 -  Risk for fall due to : - - - History of fall(s) -  Follow up - - - Education provided -     Objective:  Matthew [name] seemed alert and oriented and he participated appropriately during our telephone visit.  Blood Pressure Weight BMI  BP Readings from Last 3 Encounters:  01/12/20 120/62  10/14/19 117/69  07/16/19 (!) 145/83   Wt Readings from Last 3 Encounters:  01/12/20 212 lb (96.2 kg)  10/14/19 207 lb 3.2 oz (94 kg)  07/16/19 217 lb (98.4 kg)   BMI Readings from Last 1 Encounters:  01/12/20 31.31 kg/m    *Unable to obtain current vital signs, weight, and BMI due to telephone visit type  Hearing/Vision  . Matthew [name] did not seem to have difficulty with hearing/understanding during the telephone conversation . Reports that he has not had a formal eye exam by an eye care professional within the past year . Reports that he has not had a formal hearing evaluation within the past year *Unable to fully assess hearing and vision during telephone visit type  Cognitive Function: 6CIT Screen 03/22/2020 03/19/2019  What Year? 0 points 0 points  What month? 3 points 3 points  What time? 0 points 3 points  Count back from 20 4 points 0 points  Months in reverse 4 points 4 points  Repeat phrase 10 points 10 points  Total Score 21 20   (Normal:0-7, Significant for Dysfunction: >8)  Normal Cognitive Function Screening: No: 21   Immunization & Health Maintenance Record Immunization History  Administered Date(s) Administered  . Influenza,inj,Quad PF,6+ Mos 01/13/2014, 11/24/2014, 12/02/2015, 10/23/2017, 10/25/2018, 11/18/2019  .  Pneumococcal Polysaccharide-23 08/22/2012  . Tdap 12/05/2015    Health Maintenance  Topic Date Due  . COVID-19 Vaccine (1) Never done  . OPHTHALMOLOGY EXAM  Never done  .  COLON CANCER SCREENING ANNUAL FOBT  03/14/2016  . Fecal DNA (Cologuard)  07/15/2020 (Originally 07/22/2009)  . HEMOGLOBIN A1C  07/11/2020  . FOOT EXAM  01/11/2021  . URINE MICROALBUMIN  01/11/2021  . TETANUS/TDAP  12/04/2025  . INFLUENZA VACCINE  Completed  . PNEUMOCOCCAL POLYSACCHARIDE VACCINE AGE 40-64 HIGH RISK  Completed  . Hepatitis C Screening  Completed  . HIV Screening  Completed       Assessment  This is a routine wellness examination for Matthew [name].  Health Maintenance: Due or Overdue Health Maintenance Due  Topic Date Due  . COVID-19 Vaccine (1) Never done  . OPHTHALMOLOGY EXAM  Never done  . COLON CANCER SCREENING ANNUAL FOBT  03/14/2016    Matthew [name] does not need a referral for Community Assistance: Care Management:   no Social Work:    no Prescription Assistance:  no Nutrition/Diabetes Education:  no   Plan:  Personalized Goals Goals Addressed   None    Personalized Health Maintenance & Screening Recommendations   Covid Vaccinations  Colon Cancer Screening  Yearly Eye Exam  Lung Cancer Screening Recommended: no (Low Dose CT Chest recommended if Age 13-80 years, 30 pack-year currently smoking OR have quit w/in past 15 years) Hepatitis C Screening recommended: no HIV Screening recommended: no  Advanced Directives: Written information was not prepared per patient's request.  Referrals & Orders No orders of the defined types were placed in this encounter.   Follow-up Plan . Follow-up with [name], FNP as planned . Schedule 01/11/2021   I have personally reviewed and noted the following in the patient's chart:   . Medical and social history . Use of alcohol, tobacco or illicit drugs  . Current medications and supplements . Functional ability and  status . Nutritional status . Physical activity . Advanced directives . List of other physicians . Hospitalizations, surgeries, and ER visits in previous 12 months . Vitals . Screenings to include cognitive, depression, and falls . Referrals and appointments  In addition, I have reviewed and discussed with Matthew [name] certain preventive protocols, quality metrics, and best practice recommendations. A written personalized care plan for preventive services as well as general preventive health recommendations is available and can be mailed to the patient at his request.      Matthew [name]  07/19/8157

## 2020-03-22 NOTE — Patient Instructions (Addendum)
  [facility] Maintenance Summary and Written Plan of Care  Mr. Matthew [name] ,  Thank you for allowing me to perform your Medicare Annual Wellness Visit and for your ongoing commitment to your health.   Health Maintenance & Immunization History Health Maintenance  Topic Date Due  . COVID-19 Vaccine (1) Never done  . OPHTHALMOLOGY EXAM  Never done  . COLON CANCER SCREENING ANNUAL FOBT  03/14/2016  . Fecal DNA (Cologuard)  07/15/2020 (Originally 07/22/2009)  . HEMOGLOBIN A1C  07/11/2020  . FOOT EXAM  01/11/2021  . URINE MICROALBUMIN  01/11/2021  . TETANUS/TDAP  12/04/2025  . INFLUENZA VACCINE  Completed  . PNEUMOCOCCAL POLYSACCHARIDE VACCINE AGE 33-64 HIGH RISK  Completed  . Hepatitis C Screening  Completed  . HIV Screening  Completed   Immunization History  Administered Date(s) Administered  . Influenza,inj,Quad PF,6+ Mos 01/13/2014, 11/24/2014, 12/02/2015, 10/23/2017, 10/25/2018, 11/18/2019  . Pneumococcal Polysaccharide-23 08/22/2012  . Tdap 12/05/2015    These are the patient goals that we discussed: Goals Addressed   None      This is a list of Health Maintenance Items that are overdue or due now: Health Maintenance Due  Topic Date Due  . COVID-19 Vaccine (1) Never done  . OPHTHALMOLOGY EXAM  Never done  . COLON CANCER SCREENING ANNUAL FOBT  03/14/2016     Orders/Referrals Placed Today: No orders of the defined types were placed in this encounter.  (Contact our referral department at [phone] if you have not spoken with someone about your referral appointment within the next 5 days)    Follow-up Plan  Scheduled with Matthew Dun, FNP on 05/11/2020 at 11:25 am.

## 2020-03-26 ENCOUNTER — Other Ambulatory Visit: Payer: Self-pay | Admitting: Family

## 2020-03-26 DIAGNOSIS — J441 Chronic obstructive pulmonary disease with (acute) exacerbation: Secondary | ICD-10-CM

## 2020-04-15 ENCOUNTER — Other Ambulatory Visit: Payer: Self-pay | Admitting: Family

## 2020-04-24 ENCOUNTER — Other Ambulatory Visit: Payer: Self-pay | Admitting: Family

## 2020-04-27 ENCOUNTER — Other Ambulatory Visit: Payer: Self-pay

## 2020-04-27 NOTE — Telephone Encounter (Signed)
  Prescription Request  04/27/2020  What is the name of the medication or equipment? Magic mouthwash  Have you contacted your pharmacy to request a refill? (if applicable) yes  Which pharmacy would you [facility] this sent to? [facility]   Patient notified that their request is being sent to the clinical staff for review and that they should receive a response within 2 business days.

## 2020-04-28 ENCOUNTER — Other Ambulatory Visit: Payer: Self-pay | Admitting: Family Medicine

## 2020-04-28 MED ORDER — MAGIC MOUTHWASH W/LIDOCAINE: 10.0000 mL | Freq: Three times a day (TID) | ORAL | 2 refills | Status: DC | PRN

## 2020-04-29 MED ORDER — MAGIC MOUTHWASH W/LIDOCAINE: 10.0000 mL | Freq: Three times a day (TID) | ORAL | 2 refills | Status: DC | PRN

## 2020-04-29 NOTE — Telephone Encounter (Signed)
Prescription sent to pharmacy.

## 2020-05-06 ENCOUNTER — Encounter: Payer: Self-pay | Admitting: *Deleted

## 2020-05-11 ENCOUNTER — Ambulatory Visit: Payer: Medicare Other | Admitting: Family

## 2020-05-15 ENCOUNTER — Other Ambulatory Visit: Payer: Self-pay | Admitting: Family

## 2020-05-15 DIAGNOSIS — I10 Essential (primary) hypertension: Secondary | ICD-10-CM

## 2020-05-15 DIAGNOSIS — E785 Hyperlipidemia, unspecified: Secondary | ICD-10-CM

## 2020-05-17 ENCOUNTER — Other Ambulatory Visit: Payer: Self-pay | Admitting: Family

## 2020-05-17 DIAGNOSIS — G500 Trigeminal neuralgia: Secondary | ICD-10-CM

## 2020-05-19 ENCOUNTER — Other Ambulatory Visit: Payer: Self-pay

## 2020-05-19 ENCOUNTER — Ambulatory Visit (INDEPENDENT_AMBULATORY_CARE_PROVIDER_SITE_OTHER): Payer: Medicare Other | Admitting: Family

## 2020-05-19 ENCOUNTER — Encounter: Payer: Self-pay | Admitting: Family

## 2020-05-19 VITALS — BP 128/78 | HR 56 | Temp 98.0°F | Ht 69.0 in | Wt 199.6 lb

## 2020-05-19 DIAGNOSIS — J301 Allergic rhinitis due to pollen: Secondary | ICD-10-CM | POA: Diagnosis not present

## 2020-05-19 DIAGNOSIS — R29810 Facial weakness: Secondary | ICD-10-CM

## 2020-05-19 DIAGNOSIS — I700 Atherosclerosis of aorta: Secondary | ICD-10-CM | POA: Diagnosis not present

## 2020-05-19 DIAGNOSIS — Z8673 Personal history of transient ischemic attack (TIA), and cerebral infarction without residual deficits: Secondary | ICD-10-CM | POA: Diagnosis not present

## 2020-05-19 DIAGNOSIS — I10 Essential (primary) hypertension: Secondary | ICD-10-CM

## 2020-05-19 DIAGNOSIS — Z720 Tobacco use: Secondary | ICD-10-CM | POA: Diagnosis not present

## 2020-05-19 DIAGNOSIS — F331 Major depressive disorder, recurrent, moderate: Secondary | ICD-10-CM

## 2020-05-19 DIAGNOSIS — I4891 Unspecified atrial fibrillation: Secondary | ICD-10-CM

## 2020-05-19 DIAGNOSIS — E1142 Type 2 diabetes mellitus with diabetic polyneuropathy: Secondary | ICD-10-CM | POA: Diagnosis not present

## 2020-05-19 DIAGNOSIS — E785 Hyperlipidemia, unspecified: Secondary | ICD-10-CM

## 2020-05-19 DIAGNOSIS — E669 Obesity, unspecified: Secondary | ICD-10-CM | POA: Diagnosis not present

## 2020-05-19 DIAGNOSIS — G500 Trigeminal neuralgia: Secondary | ICD-10-CM

## 2020-05-19 DIAGNOSIS — I6930 Unspecified sequelae of cerebral infarction: Secondary | ICD-10-CM | POA: Diagnosis not present

## 2020-05-19 DIAGNOSIS — J441 Chronic obstructive pulmonary disease with (acute) exacerbation: Secondary | ICD-10-CM | POA: Diagnosis not present

## 2020-05-19 LAB — BAYER DCA HB A1C WAIVED: HB A1C (BAYER DCA - WAIVED): 5.6 % (ref <7.0)

## 2020-05-19 MED ORDER — FLUTICASONE PROPIONATE 50 MCG/ACT NA SUSP: 2.0000 | Freq: Every day | NASAL | 1 refills | Status: DC

## 2020-05-19 MED ORDER — LISINOPRIL 5 MG PO TABS: 5.0000 mg | ORAL_TABLET | Freq: Every day | ORAL | 1 refills | Status: DC

## 2020-05-19 MED ORDER — DOXYCYCLINE HYCLATE 100 MG PO TABS: 100.0000 mg | ORAL_TABLET | Freq: Two times a day (BID) | ORAL | 0 refills | Status: DC

## 2020-05-19 MED ORDER — ATORVASTATIN CALCIUM 40 MG PO TABS: 40.0000 mg | ORAL_TABLET | Freq: Every day | ORAL | 0 refills | Status: DC

## 2020-05-19 MED ORDER — LORATADINE 10 MG PO TABS: 10.0000 mg | ORAL_TABLET | Freq: Every day | ORAL | 3 refills | Status: DC

## 2020-05-19 MED ORDER — CARVEDILOL 3.125 MG PO TABS: 3.1250 mg | ORAL_TABLET | Freq: Two times a day (BID) | ORAL | 0 refills | Status: DC

## 2020-05-19 MED ORDER — ELIQUIS 5 MG PO TABS
5.0000 mg | ORAL_TABLET | Freq: Two times a day (BID) | ORAL | 1 refills | Status: DC
Start: 2020-05-19 — End: 2020-09-28

## 2020-05-19 MED ORDER — GLUCOSE BLOOD VI STRP: ORAL_STRIP | 3 refills | Status: DC

## 2020-05-19 MED ORDER — DULOXETINE HCL 60 MG PO CPEP: 60.0000 mg | ORAL_CAPSULE | Freq: Every day | ORAL | 3 refills | Status: DC

## 2020-05-19 MED ORDER — TRELEGY ELLIPTA 100-62.5-25 MCG/INH IN AEPB: INHALATION_SPRAY | RESPIRATORY_TRACT | 1 refills | Status: DC

## 2020-05-19 MED ORDER — ALBUTEROL SULFATE HFA 108 (90 BASE) MCG/ACT IN AERS: INHALATION_SPRAY | RESPIRATORY_TRACT | 1 refills | Status: DC

## 2020-05-19 MED ORDER — AMLODIPINE BESYLATE 10 MG PO TABS: 1.0000 | ORAL_TABLET | Freq: Every day | ORAL | 3 refills | Status: DC

## 2020-05-19 MED ORDER — VITAMIN D (ERGOCALCIFEROL) 1.25 MG (50000 UNIT) PO CAPS: 50000.0000 [IU] | ORAL_CAPSULE | ORAL | 3 refills | Status: DC

## 2020-05-19 NOTE — Progress Notes (Signed)
Subjective:    Patient ID: Matthew [name], male    DOB: 01/24/1961, 60 y.o.   MRN: [id number]  Chief Complaint  Patient presents with  . Diabetes   Pt presents to the office todayfor chronic follow up.He reports he has not seen his Cardiologists and Neurologists in "awhile". He reports his A Fib is stable.He is taking Eliqus 5 mg BID. He has a hx of CVA with mild left sided weakness. He reports his  Trigeminal neuralgia is worsening and does not feel like the gabapentin is working.   To me he seems like he has right sided facial drooping and harder to understand speech. He reports he has been like this for months and does not think he had a CVA.  Diabetes He presents for his follow-up diabetic visit. He has type 2 diabetes mellitus. His disease course has been stable. There are no hypoglycemic associated symptoms. Pertinent negatives for diabetes include no blurred vision and no foot paresthesias. Symptoms are stable. Diabetic complications include a CVA, heart disease and peripheral neuropathy. Risk factors for coronary artery disease include dyslipidemia, diabetes mellitus, male sex, hypertension and sedentary lifestyle. He is following a generally healthy diet. His overall blood glucose range is 110-130 mg/dl. An ACE inhibitor/angiotensin II receptor blocker is being taken. Eye exam is not current.  Hypertension This is a chronic problem. The current episode started more than 1 year ago. The problem has been resolved since onset. The problem is controlled. Associated symptoms include malaise/fatigue. Pertinent negatives include no blurred vision, peripheral edema or shortness of breath. Hypertensive end-organ damage includes CVA.  Depression        This is a chronic problem.  The current episode started more than 1 year ago.   The problem occurs intermittently.  Associated symptoms include not irritable, no restlessness and not sad.  Past treatments include SNRIs - Serotonin and norepinephrine  reuptake inhibitors.  Compliance with treatment is good. COPD Reports his breathing is stable. He continues to smoke 1 1/2 packs a day. Reports he is using Trelegy inhaler daily.    Review of Systems  Constitutional: Positive for malaise/fatigue.  Eyes: Negative for blurred vision.  Respiratory: Negative for shortness of breath.   Psychiatric/Behavioral: Positive for depression.  All other systems reviewed and are negative.      Objective:   Physical Exam Vitals reviewed.  Constitutional:      General: He is not irritable.He is not in acute distress.    Appearance: He is well-developed. He is obese.  HENT:     Head: Normocephalic.     Comments: Right facial drooping    Right Ear: Tympanic membrane normal.     Left Ear: Tympanic membrane normal.  Eyes:     General:        Right eye: No discharge.        Left eye: No discharge.     Pupils: Pupils are equal, round, and reactive to light.  Neck:     Thyroid: No thyromegaly.  Cardiovascular:     Rate and Rhythm: Normal rate and regular rhythm.     Heart sounds: Normal heart sounds. No murmur heard.   Pulmonary:     Effort: Pulmonary effort is normal. No respiratory distress.     Breath sounds: Wheezing and rhonchi present.  Abdominal:     General: Bowel sounds are normal. There is no distension.     Palpations: Abdomen is soft.     Tenderness: There is no abdominal  tenderness.  Musculoskeletal:        General: No tenderness. Normal range of motion.     Cervical back: Normal range of motion and neck supple.  Skin:    General: Skin is warm and dry.     Findings: No erythema or rash.  Neurological:     Mental Status: He is alert and oriented to person, place, and time.     Cranial Nerves: No cranial nerve deficit.     Deep Tendon Reflexes: Reflexes are normal and symmetric.  Psychiatric:        Behavior: Behavior normal.        Thought Content: Thought content normal.        Judgment: Judgment normal.       BP  128/78   Pulse (!) 56   Temp 98 F (36.7 C) (Temporal)   Ht $R'5\' 9"'qZ$  (1.753 m)   Wt 199 lb 9.6 oz (90.5 kg)   BMI 29.48 kg/m      Assessment & Plan:  Osie Amparo Glatfelter comes in today with chief complaint of Diabetes   Diagnosis and orders addressed:  1. Atrial fibrillation with RVR (HCC) - apixaban (ELIQUIS) 5 MG TABS tablet; Take 1 tablet (5 mg total) by mouth 2 (two) times daily.  Dispense: 180 tablet; Refill: 1 - CMP14+EGFR - CBC with Differential/Platelet  2. Chronic obstructive pulmonary disease with acute exacerbation (HCC) - Fluticasone-Umeclidin-Vilant (TRELEGY ELLIPTA) 100-62.5-25 MCG/INH AEPB; INHALE 1 PUFF INTO THE LUNGS EVERY DAY  Dispense: 180 each; Refill: 1 - albuterol (VENTOLIN HFA) 108 (90 Base) MCG/ACT inhaler; INHALE TWO PUFFS INTO LUNGS EVERY SIX HOURS AS NEEDED FOR WHEEZE OR SHORTNESS OF BREATH.  Dispense: 18 each; Refill: 1 - CMP14+EGFR - CBC with Differential/Platelet  3. COPD exacerbation ([place]) - Fluticasone-Umeclidin-Vilant (TRELEGY ELLIPTA) 100-62.5-25 MCG/INH AEPB; INHALE 1 PUFF INTO THE LUNGS EVERY DAY  Dispense: 180 each; Refill: 1 - albuterol (VENTOLIN HFA) 108 (90 Base) MCG/ACT inhaler; INHALE TWO PUFFS INTO LUNGS EVERY SIX HOURS AS NEEDED FOR WHEEZE OR SHORTNESS OF BREATH.  Dispense: 18 each; Refill: 1 - CMP14+EGFR - CBC with Differential/Platelet - doxycycline (VIBRA-TABS) 100 MG tablet; Take 1 tablet (100 mg total) by mouth 2 (two) times daily.  Dispense: 20 tablet; Refill: 0  4. Essential (primary) hypertension - carvedilol (COREG) 3.125 MG tablet; Take 1 tablet (3.125 mg total) by mouth 2 (two) times daily with a meal. (NEEDS TO BE SEEN BEFORE NEXT REFILL)  Dispense: 60 tablet; Refill: 0 - CMP14+EGFR - CBC with Differential/Platelet  5. Essential hypertension - lisinopril (ZESTRIL) 5 MG tablet; Take 1 tablet (5 mg total) by mouth daily.  Dispense: 90 tablet; Refill: 1 - CMP14+EGFR - CBC with Differential/Platelet  6. Hyperlipidemia, unspecified  hyperlipidemia type - atorvastatin (LIPITOR) 40 MG tablet; Take 1 tablet (40 mg total) by mouth daily. (NEEDS TO BE SEEN BEFORE NEXT REFILL)  Dispense: 30 tablet; Refill: 0 - CMP14+EGFR - CBC with Differential/Platelet  7. Seasonal allergic rhinitis due to pollen - loratadine (CLARITIN) 10 MG tablet; Take 1 tablet (10 mg total) by mouth daily.  Dispense: 90 tablet; Refill: 3 - CMP14+EGFR - CBC with Differential/Platelet  8. Trigeminal neuralgia - CMP14+EGFR - CBC with Differential/Platelet - Ambulatory referral to Neurology - MR Brain W Wo Contrast; Future  9. Primary hypertension - CMP14+EGFR - CBC with Differential/Platelet  10. Obesity (BMI 30-39.9) - CMP14+EGFR - CBC with Differential/Platelet  11. Thoracic aortic atherosclerosis (HCC) - CMP14+EGFR - CBC with Differential/Platelet  12. Moderate episode of recurrent major  depressive disorder ([place]) - CMP14+EGFR - CBC with Differential/Platelet  13. H/O: CVA (cerebrovascular accident) - CMP14+EGFR - CBC with Differential/Platelet - Ambulatory referral to Neurology - MR Brain W Wo Contrast; Future  14. Tobacco use - CMP14+EGFR - CBC with Differential/Platelet  15. Late effect of cerebrovascular accident (CVA) - CMP14+EGFR - CBC with Differential/Platelet - Ambulatory referral to Neurology - MR Brain W Wo Contrast; Future  16. Type 2 diabetes mellitus with diabetic polyneuropathy, without long-term current use of insulin (HCC) - Bayer DCA Hb A1c Waived - CMP14+EGFR - CBC with Differential/Platelet  17. Facial droop Given this is new since our last visit, I will order MRI and referral to Neurologists. Has hx of CVA and multiple risk factors.  Red flags discussed - CMP14+EGFR - CBC with Differential/Platelet - Ambulatory referral to Neurology - MR Brain W Wo Contrast; Future  Spent approx 45 mins with patient discussing CVA .  Labs pending Health Maintenance reviewed Diet and exercise encouraged  Follow  up plan: 1 months    Evelina Dun, FNP

## 2020-05-19 NOTE — Patient Instructions (Signed)
Stroke Prevention Some medical conditions and lifestyle choices can lead to a higher risk for a stroke. You can help to prevent a stroke by eating healthy foods and exercising. It also helps to not smoke and to manage any health problems you may have. How can this condition affect me? A stroke is an emergency. It should be treated right away. A stroke can lead to brain damage or threaten your life. There is a better chance of surviving and getting better after a stroke if you get medical help right away. What can increase my risk? The following medical conditions may increase your risk of a stroke:  Diseases of the heart and blood vessels (cardiovascular disease).  High blood pressure (hypertension).  Diabetes.  High cholesterol.  Sickle cell disease.  Problems with blood clotting.  Being very overweight.  Sleeping problems (obstructivesleep apnea). Other risk factors include:  Being older than age 60.  A history of blood clots, stroke, or mini-stroke (TIA).  Race, ethnic background, or a family history of stroke.  Smoking or using tobacco products.  Taking birth control pills, especially if you smoke.  Heavy alcohol and drug use.  Not being active. What actions can I take to prevent this? Manage your health conditions  High cholesterol. ? Eat a healthy diet. If this is not enough to manage your cholesterol, you may need to take medicines. ? Take medicines as told by your doctor.  High blood pressure. ? Try to keep your blood pressure below 130/80. ? If your blood pressure cannot be managed through a healthy diet and regular exercise, you may need to take medicines. ? Take medicines as told by your doctor. ? Ask your doctor if you should check your blood pressure at home. ? Have your blood pressure checked every year.  Diabetes. ? Eat a healthy diet and get regular exercise. If your blood sugar (glucose) cannot be managed through diet and exercise, you may need to  take medicines. ? Take medicines as told by your doctor.  Talk to your doctor about getting checked for sleeping problems. Signs of a problem can include: ? Snoring a lot. ? Feeling very tired.  Make sure that you manage any other conditions you have. Nutrition  Follow instructions from your doctor about what to eat or drink. You may be told to: ? Eat and drink fewer calories each day. ? Limit how much salt (sodium) you use to 1,500 milligrams (mg) each day. ? Use only healthy fats for cooking, such as olive oil, canola oil, and sunflower oil. ? Eat healthy foods. To do this:  Choose foods that are high in fiber. These include whole grains, and fresh fruits and vegetables.  Eat at least 5 servings of fruits and vegetables a day. Try to fill one-half of your plate with fruits and vegetables at each meal.  Choose low-fat (lean) proteins. These include low-fat cuts of meat, chicken without skin, fish, tofu, beans, and nuts.  Eat low-fat dairy products. ? Avoid foods that:  Are high in salt.  Have saturated fat.  Have trans fat.  Have cholesterol.  Are processed or pre-made. ? Count how many carbohydrates you eat and drink each day.   Lifestyle  If you drink alcohol: ? Limit how much you have to:  0-1 drink a day for women who are not pregnant.  0-2 drinks a day for men. ? Know how much alcohol is in your drink. In the [place] one drink equals one 12 oz bottle   of beer (355mL), one 5 oz glass of wine (148mL), or one 1 oz glass of hard liquor (44mL).  Do not smoke or use any products that have nicotine or tobacco. If you need help quitting, ask your doctor.  Avoid secondhand smoke.  Do not use drugs. Activity  Try to stay at a healthy weight.  Get at least 30 minutes of exercise on most days, such as: ? Fast walking. ? Biking. ? Swimming.   Medicines  Take over-the-counter and prescription medicines only as told by your doctor.  Avoid taking birth control pills.  Talk to your doctor about the risks of taking birth control pills if: ? You are over 35 years old. ? You smoke. ? You get very bad headaches. ? You have had a blood clot. Where to find more information  American Stroke Association: www.strokeassociation.org Get help right away if:  You or a loved one has any signs of a stroke. "BE FAST" is an easy way to remember the warning signs: ? B - Balance. Dizziness, sudden trouble walking, or loss of balance. ? E - Eyes. Trouble seeing or a change in how you see. ? F - Face. Sudden weakness or loss of feeling of the face. The face or eyelid may droop on one side. ? A - Arms. Weakness or loss of feeling in an arm. This happens all of a sudden and most often on one side of the body. ? S - Speech. Sudden trouble speaking, slurred speech, or trouble understanding what people say. ? T - Time. Time to call emergency services. Write down what time symptoms started.  You or a loved one has other signs of a stroke, such as: ? A sudden, very bad headache with no known cause. ? Feeling like you may vomit (nausea). ? Vomiting. ? A seizure. These symptoms may be an emergency. Get help right away. Call your local emergency services (911 in the [place]  Do not wait to see if the symptoms will go away.  Do not drive yourself to the hospital. Summary  You can help to prevent a stroke by eating healthy, exercising, and not smoking. It also helps to manage any health problems you have.  Do not smoke or use any products that contain nicotine or tobacco.  Get help right away if you or a loved one has any signs of a stroke. This information is not intended to replace advice given to you by your health care provider. Make sure you discuss any questions you have with your health care provider. Document Revised: 09/01/2019 Document Reviewed: 09/01/2019 Elsevier Patient Education  2021 Elsevier Inc.  

## 2020-05-20 LAB — CMP14+EGFR
ALT: 18 IU/L (ref 0–44)
AST: 14 IU/L (ref 0–40)
Albumin/Globulin Ratio: 1.5 (ref 1.2–2.2)
Albumin: 4.1 g/dL (ref 3.8–4.9)
Alkaline Phosphatase: 91 IU/L (ref 44–121)
BUN/Creatinine Ratio: 14 (ref 10–24)
BUN: 8 mg/dL (ref 8–27)
Bilirubin Total: 0.3 mg/dL (ref 0.0–1.2)
CO2: 19 mmol/L — ABNORMAL LOW (ref 20–29)
Calcium: 9 mg/dL (ref 8.6–10.2)
Chloride: 102 mmol/L (ref 96–106)
Creatinine, Ser: 0.57 mg/dL — ABNORMAL LOW (ref 0.76–1.27)
Globulin, Total: 2.7 g/dL (ref 1.5–4.5)
Glucose: 101 mg/dL — ABNORMAL HIGH (ref 65–99)
Potassium: 4.4 mmol/L (ref 3.5–5.2)
Sodium: 141 mmol/L (ref 134–144)
Total Protein: 6.8 g/dL (ref 6.0–8.5)
eGFR: 112 mL/min/{1.73_m2} (ref >59)

## 2020-05-20 LAB — CBC WITH DIFFERENTIAL/PLATELET
Basophils Absolute: 0.1 10*3/uL (ref 0.0–0.2)
Basos: 1 %
EOS (ABSOLUTE): 0.3 10*3/uL (ref 0.0–0.4)
Eos: 3 %
Hematocrit: 43 % (ref 37.5–51.0)
Hemoglobin: 14.2 g/dL (ref 13.0–17.7)
Immature Grans (Abs): 0.1 10*3/uL (ref 0.0–0.1)
Immature Granulocytes: 1 %
Lymphocytes Absolute: 2.2 10*3/uL (ref 0.7–3.1)
Lymphs: 21 %
MCH: 29.6 pg (ref 26.6–33.0)
MCHC: 33 g/dL (ref 31.5–35.7)
MCV: 90 fL (ref 79–97)
Monocytes Absolute: 0.6 10*3/uL (ref 0.1–0.9)
Monocytes: 6 %
Neutrophils Absolute: 7 10*3/uL (ref 1.4–7.0)
Neutrophils: 68 %
Platelets: 229 10*3/uL (ref 150–450)
RBC: 4.79 x10E6/uL (ref 4.14–5.80)
RDW: 12.6 % (ref 11.6–15.4)
WBC: 10.2 10*3/uL (ref 3.4–10.8)

## 2020-05-21 ENCOUNTER — Encounter: Payer: Self-pay | Admitting: Neurology

## 2020-05-21 ENCOUNTER — Telehealth: Payer: Self-pay

## 2020-05-24 NOTE — Telephone Encounter (Signed)
Ok

## 2020-06-02 ENCOUNTER — Ambulatory Visit (HOSPITAL_COMMUNITY): Payer: Medicare Other

## 2020-06-10 ENCOUNTER — Other Ambulatory Visit: Payer: Self-pay | Admitting: Family

## 2020-06-10 DIAGNOSIS — E785 Hyperlipidemia, unspecified: Secondary | ICD-10-CM

## 2020-06-10 DIAGNOSIS — I10 Essential (primary) hypertension: Secondary | ICD-10-CM

## 2020-06-16 ENCOUNTER — Ambulatory Visit (HOSPITAL_COMMUNITY): Admission: RE | Admit: 2020-06-16 | Payer: Medicare Other | Source: Ambulatory Visit

## 2020-06-21 ENCOUNTER — Encounter: Payer: Self-pay | Admitting: Family

## 2020-06-21 ENCOUNTER — Other Ambulatory Visit: Payer: Self-pay

## 2020-06-21 ENCOUNTER — Ambulatory Visit (INDEPENDENT_AMBULATORY_CARE_PROVIDER_SITE_OTHER): Payer: Medicare Other | Admitting: Family

## 2020-06-21 VITALS — BP 139/79 | HR 51 | Temp 98.0°F | Ht 69.0 in | Wt 201.8 lb

## 2020-06-21 DIAGNOSIS — E1142 Type 2 diabetes mellitus with diabetic polyneuropathy: Secondary | ICD-10-CM | POA: Diagnosis not present

## 2020-06-21 DIAGNOSIS — Z720 Tobacco use: Secondary | ICD-10-CM | POA: Diagnosis not present

## 2020-06-21 DIAGNOSIS — I10 Essential (primary) hypertension: Secondary | ICD-10-CM | POA: Diagnosis not present

## 2020-06-21 DIAGNOSIS — J301 Allergic rhinitis due to pollen: Secondary | ICD-10-CM | POA: Diagnosis not present

## 2020-06-21 DIAGNOSIS — I6930 Unspecified sequelae of cerebral infarction: Secondary | ICD-10-CM

## 2020-06-21 DIAGNOSIS — G500 Trigeminal neuralgia: Secondary | ICD-10-CM | POA: Diagnosis not present

## 2020-06-21 DIAGNOSIS — J438 Other emphysema: Secondary | ICD-10-CM | POA: Diagnosis not present

## 2020-06-21 DIAGNOSIS — Z550 Illiteracy and low-level literacy: Secondary | ICD-10-CM | POA: Diagnosis not present

## 2020-06-21 MED ORDER — LORATADINE 10 MG PO TABS: 10.0000 mg | ORAL_TABLET | Freq: Every day | ORAL | 3 refills | Status: DC

## 2020-06-21 MED ORDER — LISINOPRIL 5 MG PO TABS: 5.0000 mg | ORAL_TABLET | Freq: Every day | ORAL | 1 refills | Status: DC

## 2020-06-21 MED ORDER — MAGIC MOUTHWASH W/LIDOCAINE: 10.0000 mL | Freq: Three times a day (TID) | ORAL | 2 refills | Status: DC | PRN

## 2020-06-21 MED ORDER — PREDNISONE 20 MG PO TABS
40.0000 mg | ORAL_TABLET | Freq: Every day | ORAL | 0 refills | Status: AC
Start: 2020-06-21 — End: 2020-06-26

## 2020-06-21 NOTE — Patient Instructions (Signed)
Trigeminal Neuralgia  Trigeminal neuralgia is a nerve disorder that causes severe pain on one side of the face. The pain may last from a few seconds to several minutes. The pain is usually only on one side of the face. Symptoms may occur for days, weeks, or months and then go away for months or years. The pain may return and be worse than before. What are the causes? This condition is caused by damage or pressure to a nerve in the head that is called the trigeminal nerve. An attack can be triggered by:  Talking.  Chewing.  Putting on makeup.  Washing your face.  Shaving your face.  Brushing your teeth.  Touching your face. What increases the risk? You are more likely to develop this condition if you:  Are 50 years of age or older.  Are male. What are the signs or symptoms? The main symptom of this condition is severe pain in the:  Jaw.  Lips.  Eyes.  Nose.  Scalp.  Forehead.  Face. The pain may be:  Intense.  Stabbing.  Electric.  Shock-like. How is this diagnosed? This condition is diagnosed with a physical exam. A CT scan or an MRI may be done to rule out other conditions that can cause facial pain. How is this treated? This condition may be treated with:  Avoiding the things that trigger your symptoms.  Taking prescription medicines (anticonvulsants).  Having surgery. This may be done in severe cases if other medical treatment does not provide relief.  Having procedures such as ablation, thermal, or radiation therapy. It may take up to one month for treatment to start relieving the pain. Follow these instructions at home: Managing pain  Learn as much as you can about how to manage your pain. Ask your health care provider if a pain specialist would be helpful.  Consider talking with a mental health care provider (psychologist) about how to cope with the pain.  Consider joining a pain support group. General instructions  Take  over-the-counter and prescription medicines only as told by your health care provider.  Avoid the things that trigger your symptoms. It may help to: ? Chew on the unaffected side of your mouth. ? Avoid touching your face. ? Avoid blasts of hot or cold air.  Follow your treatment plan as told by your health care provider. This may include: ? Cognitive or behavioral therapy. ? Gentle, regular exercise. ? Meditation or yoga. ? Aromatherapy.  Keep all follow-up visits as told by your health care provider. You may need to be monitored closely to make sure treatment is working well for you. Where to find more information  Facial Pain Association: fpa-support.org Contact a health care provider if:  Your medicine is not helping your symptoms.  You have side effects from the medicine used for treatment.  You develop new, unexplained symptoms, such as: ? Double vision. ? Facial weakness. ? Facial numbness. ? Changes in hearing or balance.  You feel depressed. Get help right away if:  Your pain is severe and is not getting better.  You develop suicidal thoughts. If you ever feel like you may hurt yourself or others, or have thoughts about taking your own life, get help right away. You can go to your nearest emergency department or call:  Your local emergency services (911 in the [place]  A suicide crisis helpline, such as the National Suicide Prevention Lifeline at 1-800-273-8255. This is open 24 hours a day. Summary  Trigeminal neuralgia is a   nerve disorder that causes severe pain on one side of the face. The pain may last from a few seconds to several minutes.  This condition is caused by damage or pressure to a nerve in the head that is called the trigeminal nerve.  Treatment may include avoiding the things that trigger your symptoms, taking medicines, or having surgery or procedures. It may take up to one month for treatment to start relieving the pain.  Avoid the things that  trigger your symptoms.  Keep all follow-up visits as told by your health care provider. You may need to be monitored closely to make sure treatment is working well for you. This information is not intended to replace advice given to you by your health care provider. Make sure you discuss any questions you have with your health care provider. Document Revised: 12/17/2017 Document Reviewed: 12/17/2017 Elsevier Patient Education  2021 Elsevier Inc.  

## 2020-06-21 NOTE — Progress Notes (Signed)
Subjective:    Patient ID: Matthew [name], male    DOB: [birth year], 60 y.o.   MRN: [id number]  Chief Complaint  Patient presents with  . Hypertension    1 mth follow up     HPI Pt presents to the office today to discuss MRI and Neurologists. He states he has not been able to get his MRI because his ride was sick. He has a Neurologists follow up on 08/13/20.   His last visit it was noted changes in his speech and he was complaining of facial pain. He has trigeminal neuralgia and currently takes cymbalta and gabapentin.   He reports his pain is intermittent and aching of 8 out 10.   He has hx of CVA and is a diabetic.  Review of Systems  All other systems reviewed and are negative.      Objective:   Physical Exam Vitals reviewed.  Constitutional:      General: He is not in acute distress.    Appearance: He is well-developed.  HENT:     Head: Normocephalic.     Right Ear: Tympanic membrane normal.     Left Ear: Tympanic membrane normal.  Eyes:     General:        Right eye: No discharge.        Left eye: No discharge.     Pupils: Pupils are equal, round, and reactive to light.  Neck:     Thyroid: No thyromegaly.  Cardiovascular:     Rate and Rhythm: Normal rate and regular rhythm.     Heart sounds: Normal heart sounds. No murmur heard.   Pulmonary:     Effort: Pulmonary effort is normal. No respiratory distress.     Breath sounds: Decreased breath sounds present. No wheezing.  Abdominal:     General: Bowel sounds are normal. There is no distension.     Palpations: Abdomen is soft.     Tenderness: There is no abdominal tenderness.  Musculoskeletal:        General: No tenderness. Normal range of motion.     Cervical back: Normal range of motion and neck supple.  Skin:    General: Skin is warm and dry.     Findings: No erythema or rash.  Neurological:     Mental Status: He is alert and oriented to person, place, and time.     Cranial Nerves: No cranial nerve  deficit.     Deep Tendon Reflexes: Reflexes are normal and symmetric.  Psychiatric:        Behavior: Behavior normal.        Thought Content: Thought content normal.        Judgment: Judgment normal.       BP 139/79   Pulse (!) 51   Temp 98 F (36.7 C) (Temporal)   Ht 5\' 9"  (1.753 m)   Wt 201 lb 12.8 oz (91.5 kg)   BMI 29.80 kg/m      Assessment & Plan:  Moishe Schellenberg Vinluan comes in today with chief complaint of Hypertension (1 mth follow up )   Diagnosis and orders addressed:  1. Late effect of cerebrovascular accident (CVA)  2. Tobacco use  3. Unable to read or write  4. Trigeminal neuralgia Will give prednisone 40 mg for 5 days - predniSONE (DELTASONE) 20 MG tablet; Take 2 tablets (40 mg total) by mouth daily with breakfast for 5 days.  Dispense: 10 tablet; Refill: 0  5. Type 2 diabetes  mellitus with diabetic polyneuropathy, without long-term current use of insulin ([place])  6. Other emphysema ([place])  7. Seasonal allergic rhinitis due to pollen - loratadine (CLARITIN) 10 MG tablet; Take 1 tablet (10 mg total) by mouth daily.  Dispense: 90 tablet; Refill: 3  8. Essential hypertension - lisinopril (ZESTRIL) 5 MG tablet; Take 1 tablet (5 mg total) by mouth daily.  Dispense: 90 tablet; Refill: 1   Keep Neurologist and MRI appt  Evelina Dun, FNP

## 2020-06-23 ENCOUNTER — Encounter: Payer: Self-pay | Admitting: Family Medicine

## 2020-07-13 ENCOUNTER — Other Ambulatory Visit: Payer: Self-pay | Admitting: Family

## 2020-07-13 DIAGNOSIS — E785 Hyperlipidemia, unspecified: Secondary | ICD-10-CM

## 2020-07-13 DIAGNOSIS — I10 Essential (primary) hypertension: Secondary | ICD-10-CM

## 2020-07-14 ENCOUNTER — Telehealth: Payer: Self-pay | Admitting: Family

## 2020-07-14 NOTE — Telephone Encounter (Signed)
Please schedule patient a telephone visit with me to discuss.

## 2020-07-14 NOTE — Telephone Encounter (Signed)
  Prescription Request  07/14/2020  What is the name of the medication or equipment? RX for inflammation in his mouth?  Have you contacted your pharmacy to request a refill? (if applicable) no   Which pharmacy would you like this sent to? [facility]   Patient notified that their request is being sent to the clinical staff for review and that they should receive a response within 2 business days.   Lenna Gilford' pt.  The meds are working, but needs a refill bc it is helping him.  Please call pt.

## 2020-07-14 NOTE — Telephone Encounter (Signed)
Patient said to due televisit with Alyse Low please get Caryl Pina when patient calls back. VMB full

## 2020-07-15 ENCOUNTER — Encounter: Payer: Self-pay | Admitting: Family

## 2020-07-15 ENCOUNTER — Ambulatory Visit (INDEPENDENT_AMBULATORY_CARE_PROVIDER_SITE_OTHER): Payer: Medicare Other | Admitting: Family

## 2020-07-15 DIAGNOSIS — G500 Trigeminal neuralgia: Secondary | ICD-10-CM | POA: Diagnosis not present

## 2020-07-15 MED ORDER — PREDNISONE 10 MG (21) PO TBPK: ORAL_TABLET | ORAL | 0 refills | Status: DC

## 2020-07-15 NOTE — Progress Notes (Addendum)
Attempted to call patient with no answer. Unable to leave voicemail. Will close chart.     Virtual Visit  Note Due to COVID-19 pandemic this visit was conducted virtually. This visit type was conducted due to national recommendations for restrictions regarding the COVID-19 Pandemic (e.g. social distancing, sheltering in place) in an effort to limit this patient's exposure and mitigate transmission in our community. All issues noted in this document were discussed and addressed.  A physical exam was not performed with this format.  I connected with Matthew [name] on 07/15/20 at 4:25 pm  by telephone and verified that I am speaking with the correct person using two identifiers. Matthew [name] is currently located at home and no one is currently with him  during visit. The provider, Evelina Dun, FNP is located in their office at time of visit.  I discussed the limitations, risks, security and privacy concerns of performing an evaluation and management service by telephone and the availability of in person appointments. I also discussed with the patient that there may be a patient responsible charge related to this service. The patient expressed understanding and agreed to proceed.   History and Present Illness:  HPI Pt complaining of mouth pain that radiates to bilateral jaws. He has trigeminal neuralgia. He reports his pain his pain is a 8 out 10 and hard to eat.   He took prednisone that greatly helped, but once he completed it He reports his glucose has been in the 70's.   Review of Systems  All other systems reviewed and are negative.    Observations/Objective: No SOB or distress noted   Assessment and Plan: 1. Trigeminal neuralgia Strict low carb diet  Tylenol as needed Soft diet RTO if symptoms worsen or do not improve  - predniSONE (STERAPRED UNI-PAK 21 TAB) 10 MG (21) TBPK tablet; Use as directed  Dispense: 21 tablet; Refill: 0     I discussed the assessment and treatment  plan with the patient. The patient was provided an opportunity to ask questions and all were answered. The patient agreed with the plan and demonstrated an understanding of the instructions.   The patient was advised to call back or seek an in-person evaluation if the symptoms worsen or if the condition fails to improve as anticipated.  The above assessment and management plan was discussed with the patient. The patient verbalized understanding of and has agreed to the management plan. Patient is aware to call the clinic if symptoms persist or worsen. Patient is aware when to return to the clinic for a follow-up visit. Patient educated on when it is appropriate to go to the emergency department.   Time call ended:  4:40 pm   I provided 15 minutes of  non face-to-face time during this encounter.    Evelina Dun, FNP   Evelina Dun, [place]

## 2020-07-15 NOTE — Addendum Note (Signed)
Addended by: Evelina Dun A on: 07/15/2020 04:41 PM   Modules accepted: Orders, Level of Service

## 2020-07-15 NOTE — Telephone Encounter (Signed)
Appt made

## 2020-07-31 ENCOUNTER — Other Ambulatory Visit: Payer: Self-pay | Admitting: Family

## 2020-07-31 DIAGNOSIS — E785 Hyperlipidemia, unspecified: Secondary | ICD-10-CM

## 2020-07-31 DIAGNOSIS — I10 Essential (primary) hypertension: Secondary | ICD-10-CM

## 2020-08-12 ENCOUNTER — Encounter: Payer: Self-pay | Admitting: Neurology

## 2020-08-13 ENCOUNTER — Ambulatory Visit: Payer: Medicare Other | Admitting: Neurology

## 2020-08-17 ENCOUNTER — Telehealth: Payer: Self-pay | Admitting: Family

## 2020-08-17 MED ORDER — MAGIC MOUTHWASH W/LIDOCAINE: 10.0000 mL | Freq: Three times a day (TID) | ORAL | 2 refills | Status: DC | PRN

## 2020-08-17 NOTE — Telephone Encounter (Signed)
  Prescription Request  08/17/2020  What is the name of the medication or equipment? magic mouthwash w/lidocaine SOLN  Have you contacted your pharmacy to request a refill? (if applicable) yes  Which pharmacy would you like this sent to? [facility]   Patient notified that their request is being sent to the clinical staff for review and that they should receive a response within 2 business days.

## 2020-08-17 NOTE — Telephone Encounter (Signed)
Please fax for patient.

## 2020-08-17 NOTE — Telephone Encounter (Signed)
Faxed to pharmacy

## 2020-08-23 ENCOUNTER — Ambulatory Visit: Payer: Medicare Other | Admitting: Family

## 2020-08-24 ENCOUNTER — Encounter: Payer: Self-pay | Admitting: Family

## 2020-08-30 ENCOUNTER — Other Ambulatory Visit: Payer: Self-pay | Admitting: Family

## 2020-08-30 DIAGNOSIS — I10 Essential (primary) hypertension: Secondary | ICD-10-CM

## 2020-08-30 DIAGNOSIS — E785 Hyperlipidemia, unspecified: Secondary | ICD-10-CM

## 2020-09-06 ENCOUNTER — Other Ambulatory Visit: Payer: Self-pay | Admitting: Family

## 2020-09-06 DIAGNOSIS — J441 Chronic obstructive pulmonary disease with (acute) exacerbation: Secondary | ICD-10-CM

## 2020-09-28 ENCOUNTER — Encounter: Payer: Self-pay | Admitting: Family

## 2020-09-28 ENCOUNTER — Ambulatory Visit (INDEPENDENT_AMBULATORY_CARE_PROVIDER_SITE_OTHER): Payer: Medicare Other | Admitting: Family

## 2020-09-28 ENCOUNTER — Other Ambulatory Visit: Payer: Self-pay

## 2020-09-28 VITALS — BP 133/79 | HR 49 | Temp 98.1°F | Wt 204.8 lb

## 2020-09-28 DIAGNOSIS — I6930 Unspecified sequelae of cerebral infarction: Secondary | ICD-10-CM | POA: Diagnosis not present

## 2020-09-28 DIAGNOSIS — E785 Hyperlipidemia, unspecified: Secondary | ICD-10-CM | POA: Diagnosis not present

## 2020-09-28 DIAGNOSIS — G500 Trigeminal neuralgia: Secondary | ICD-10-CM | POA: Diagnosis not present

## 2020-09-28 DIAGNOSIS — I10 Essential (primary) hypertension: Secondary | ICD-10-CM

## 2020-09-28 DIAGNOSIS — Z23 Encounter for immunization: Secondary | ICD-10-CM

## 2020-09-28 DIAGNOSIS — J441 Chronic obstructive pulmonary disease with (acute) exacerbation: Secondary | ICD-10-CM

## 2020-09-28 DIAGNOSIS — Z550 Illiteracy and low-level literacy: Secondary | ICD-10-CM | POA: Diagnosis not present

## 2020-09-28 DIAGNOSIS — I4891 Unspecified atrial fibrillation: Secondary | ICD-10-CM

## 2020-09-28 DIAGNOSIS — E1142 Type 2 diabetes mellitus with diabetic polyneuropathy: Secondary | ICD-10-CM | POA: Diagnosis not present

## 2020-09-28 DIAGNOSIS — Z8673 Personal history of transient ischemic attack (TIA), and cerebral infarction without residual deficits: Secondary | ICD-10-CM | POA: Diagnosis not present

## 2020-09-28 LAB — BAYER DCA HB A1C WAIVED: HB A1C (BAYER DCA - WAIVED): 5.7 % (ref <7.0)

## 2020-09-28 MED ORDER — TRELEGY ELLIPTA 100-62.5-25 MCG/INH IN AEPB: INHALATION_SPRAY | RESPIRATORY_TRACT | 1 refills | Status: DC

## 2020-09-28 MED ORDER — AMLODIPINE BESYLATE 10 MG PO TABS: 10.0000 mg | ORAL_TABLET | Freq: Every day | ORAL | 3 refills | Status: DC

## 2020-09-28 MED ORDER — METFORMIN HCL 500 MG PO TABS
500.0000 mg | ORAL_TABLET | Freq: Every day | ORAL | 4 refills | Status: DC
Start: 2020-09-28 — End: 2021-01-04

## 2020-09-28 MED ORDER — ALBUTEROL SULFATE HFA 108 (90 BASE) MCG/ACT IN AERS: 2.0000 | INHALATION_SPRAY | Freq: Four times a day (QID) | RESPIRATORY_TRACT | 1 refills | Status: DC | PRN

## 2020-09-28 MED ORDER — ATORVASTATIN CALCIUM 40 MG PO TABS: 40.0000 mg | ORAL_TABLET | Freq: Every day | ORAL | 4 refills | Status: DC

## 2020-09-28 MED ORDER — GABAPENTIN 800 MG PO TABS: 800.0000 mg | ORAL_TABLET | Freq: Four times a day (QID) | ORAL | 3 refills | Status: DC

## 2020-09-28 MED ORDER — BACLOFEN 10 MG PO TABS
10.0000 mg | ORAL_TABLET | Freq: Three times a day (TID) | ORAL | 1 refills | Status: DC | PRN
Start: 2020-09-28 — End: 2020-10-21

## 2020-09-28 MED ORDER — DULOXETINE HCL 60 MG PO CPEP: 60.0000 mg | ORAL_CAPSULE | Freq: Every day | ORAL | 3 refills | Status: DC

## 2020-09-28 MED ORDER — APIXABAN 5 MG PO TABS: 5.0000 mg | ORAL_TABLET | Freq: Two times a day (BID) | ORAL | 1 refills | Status: DC

## 2020-09-28 MED ORDER — LISINOPRIL 5 MG PO TABS: 5.0000 mg | ORAL_TABLET | Freq: Every day | ORAL | 1 refills | Status: DC

## 2020-09-28 MED ORDER — CARVEDILOL 3.125 MG PO TABS: 3.1250 mg | ORAL_TABLET | Freq: Two times a day (BID) | ORAL | 2 refills | Status: DC

## 2020-09-28 MED ORDER — LEVETIRACETAM 500 MG PO TABS: 500.0000 mg | ORAL_TABLET | Freq: Two times a day (BID) | ORAL | 2 refills | Status: DC

## 2020-09-28 NOTE — Progress Notes (Signed)
Subjective:    Patient ID: Matthew [name], male    DOB: 27-Aug-1960, 60 y.o.   MRN: [id number]  Chief Complaint  Patient presents with   Medical Management of Chronic Issues   Pt presents to the office today for chronic follow up. He reports he has not seen his Cardiologists  in "awhile". He reports his A Fib is stable. He is taking Eliqus 5 mg BID. He has a hx of CVA with mild left sided weakness. He reports his  Trigeminal neuralgia is worsening and does not feel like the gabapentin is working.    PT still has not had his MRI. Marland Kitchen He states he has not been able to get his MRI because his ride was sick. He has a Neurologists follow up on 08/13/20, but rescheduled this.    He reports his pain is intermittent and aching of 8 out 10.    He has hx of CVA and is a diabetic.   Diabetes He presents for his follow-up diabetic visit. He has type 2 diabetes mellitus. Associated symptoms include foot paresthesias. Pertinent negatives for diabetes include no blurred vision. Diabetic complications include a CVA and heart disease. Risk factors for coronary artery disease include diabetes mellitus, dyslipidemia, male sex, hypertension and tobacco exposure. He is following a generally unhealthy diet. His overall blood glucose range is 110-130 mg/dl. An ACE inhibitor/angiotensin II receptor blocker is being taken.  Hypertension This is a chronic problem. The current episode started more than 1 year ago. The problem has been resolved since onset. The problem is controlled. Associated symptoms include malaise/fatigue. Pertinent negatives include no blurred vision, peripheral edema or shortness of breath. Risk factors for coronary artery disease include dyslipidemia, obesity, sedentary lifestyle and smoking/tobacco exposure. The current treatment provides moderate improvement. Hypertensive end-organ damage includes CVA.  Nicotine Dependence Presents for follow-up visit. His urge triggers include company of smokers.  The symptoms have been stable. He smokes 1 pack of cigarettes per day.   COPD Continues to smoke a pack a day. Denies any SOB. Using Trelegy daily.   Review of Systems  Constitutional:  Positive for malaise/fatigue.  Eyes:  Negative for blurred vision.  Respiratory:  Negative for shortness of breath.   All other systems reviewed and are negative.     Objective:   Physical Exam Vitals reviewed.  Constitutional:      General: He is not in acute distress.    Appearance: He is well-developed. He is obese.  HENT:     Head: Normocephalic.     Right Ear: Tympanic membrane and external ear normal.     Left Ear: Tympanic membrane and external ear normal.  Eyes:     General:        Right eye: No discharge.        Left eye: No discharge.     Pupils: Pupils are equal, round, and reactive to light.  Neck:     Thyroid: No thyromegaly.  Cardiovascular:     Rate and Rhythm: Normal rate and regular rhythm.     Heart sounds: Normal heart sounds. No murmur heard. Pulmonary:     Effort: Pulmonary effort is normal. No respiratory distress.     Breath sounds: Normal breath sounds. No wheezing.  Abdominal:     General: Bowel sounds are normal. There is no distension.     Palpations: Abdomen is soft.     Tenderness: There is no abdominal tenderness.  Musculoskeletal:  General: No tenderness. Normal range of motion.     Cervical back: Normal range of motion and neck supple.  Skin:    General: Skin is warm and dry.     Findings: No erythema or rash.  Neurological:     Mental Status: He is alert and oriented to person, place, and time.     Cranial Nerves: No cranial nerve deficit.     Deep Tendon Reflexes: Reflexes are normal and symmetric.  Psychiatric:        Speech: Speech is slurred (very hard to understand).        Behavior: Behavior normal.        Thought Content: Thought content normal.        Judgment: Judgment normal.      BP 133/79   Pulse (!) 49   Temp 98.1 F (36.7  C)   Wt 204 lb 12.8 oz (92.9 kg)   BMI 30.24 kg/m      Assessment & Plan:  Matthew [name] comes in today with chief complaint of Medical Management of Chronic Issues   Diagnosis and orders addressed:  1. Atrial fibrillation with RVR (HCC) - apixaban (ELIQUIS) 5 MG TABS tablet; Take 1 tablet (5 mg total) by mouth 2 (two) times daily.  Dispense: 180 tablet; Refill: 1 - CMP14+EGFR - CBC with Differential/Platelet - AMB Referral to [facility]  2. Chronic obstructive pulmonary disease with acute exacerbation (HCC) - Fluticasone-Umeclidin-Vilant (TRELEGY ELLIPTA) 100-62.5-25 MCG/INH AEPB; INHALE 1 PUFF INTO THE LUNGS EVERY DAY  Dispense: 180 each; Refill: 1 - albuterol (VENTOLIN HFA) 108 (90 Base) MCG/ACT inhaler; Inhale 2 puffs into the lungs every 6 (six) hours as needed for wheezing or shortness of breath.  Dispense: 18 each; Refill: 1 - CMP14+EGFR - CBC with Differential/Platelet - AMB Referral to [facility]  3. COPD exacerbation ([place]) - Fluticasone-Umeclidin-Vilant (TRELEGY ELLIPTA) 100-62.5-25 MCG/INH AEPB; INHALE 1 PUFF INTO THE LUNGS EVERY DAY  Dispense: 180 each; Refill: 1 - albuterol (VENTOLIN HFA) 108 (90 Base) MCG/ACT inhaler; Inhale 2 puffs into the lungs every 6 (six) hours as needed for wheezing or shortness of breath.  Dispense: 18 each; Refill: 1 - CMP14+EGFR - CBC with Differential/Platelet - AMB Referral to [facility]  4. Essential (primary) hypertension - carvedilol (COREG) 3.125 MG tablet; Take 1 tablet (3.125 mg total) by mouth 2 (two) times daily with a meal.  Dispense: 60 tablet; Refill: 2 - CMP14+EGFR - CBC with Differential/Platelet - AMB Referral to [facility]  5. Essential hypertension - lisinopril (ZESTRIL) 5 MG tablet; Take 1 tablet (5 mg total) by mouth daily.  Dispense: 90 tablet; Refill: 1 - CMP14+EGFR - CBC with Differential/Platelet - AMB Referral to [facility]  6.  Hyperlipidemia, unspecified hyperlipidemia type - atorvastatin (LIPITOR) 40 MG tablet; Take 1 tablet (40 mg total) by mouth daily.  Dispense: 90 tablet; Refill: 4 - CMP14+EGFR - CBC with Differential/Platelet - AMB Referral to [facility]  7. Trigeminal neuralgia Start Baclofen as needed - gabapentin (NEURONTIN) 800 MG tablet; Take 1 tablet (800 mg total) by mouth in the morning, at noon, in the evening, and at bedtime. (NEEDS TO BE SEEN BEFORE NEXT REFILL)  Dispense: 120 tablet; Refill: 3 - CMP14+EGFR - CBC with Differential/Platelet - baclofen (LIORESAL) 10 MG tablet; Take 1 tablet (10 mg total) by mouth 3 (three) times daily as needed for muscle spasms.  Dispense: 90 each; Refill: 1 - AMB Referral to [facility]  8. Primary hypertension - CMP14+EGFR - CBC with Differential/Platelet - AMB Referral to [facility]  9. Type 2 diabetes mellitus with diabetic polyneuropathy, without long-term current use of insulin (HCC) - CMP14+EGFR - CBC with Differential/Platelet - Bayer DCA Hb A1c Waived - AMB Referral to [facility]  10. H/O: CVA (cerebrovascular accident) - CMP14+EGFR - CBC with Differential/Platelet - AMB Referral to [facility]  11. Late effect of cerebrovascular accident (CVA) - CMP14+EGFR - CBC with Differential/Platelet - AMB Referral to [facility]  12. Unable to read or write - CMP14+EGFR - CBC with Differential/Platelet - AMB Referral to [facility] pending Health Maintenance reviewed Diet and exercise encouraged  Follow up plan: 3 months and keep Neurologists and MRI appointment !!!   Evelina Dun, FNP

## 2020-09-28 NOTE — Patient Instructions (Signed)
Trigeminal Neuralgia  Trigeminal neuralgia is a nerve disorder that causes severe pain on one side of the face. The pain may last from a few seconds to several minutes. The pain isusually only on one side of the face. Symptoms may occur for days, weeks, or months and then go away for months oryears. The pain may return and be worse than before. What are the causes? This condition is caused by damage or pressure to a nerve in the head that is called the trigeminal nerve. An attack can be triggered by: Talking. Chewing. Putting on makeup. Washing your face. Shaving your face. Brushing your teeth. Touching your face. What increases the risk? You are more likely to develop this condition if you: Are 61 years of age or older. Are male. What are the signs or symptoms? The main symptom of this condition is severe pain in the: Jaw. Lips. Eyes. Nose. Scalp. Forehead. Face. The pain may be: Intense. Stabbing. Electric. Shock-like. How is this diagnosed? This condition is diagnosed with a physical exam. A CT scan or an MRI may bedone to rule out other conditions that can cause facial pain. How is this treated? This condition may be treated with: Avoiding the things that trigger your symptoms. Taking prescription medicines (anticonvulsants). Having surgery. This may be done in severe cases if other medical treatment does not provide relief. Having procedures such as ablation, thermal, or radiation therapy. It may take up to one month for treatment to start relieving the pain. Follow these instructions at home: Managing pain Learn as much as you can about how to manage your pain. Ask your health care provider if a pain specialist would be helpful. Consider talking with a mental health care provider (psychologist) about how to cope with the pain. Consider joining a pain support group. General instructions Take over-the-counter and prescription medicines only as told by your health  care provider. Avoid the things that trigger your symptoms. It may help to: Chew on the unaffected side of your mouth. Avoid touching your face. Avoid blasts of hot or cold air. Follow your treatment plan as told by your health care provider. This may include: Cognitive or behavioral therapy. Gentle, regular exercise. Meditation or yoga. Aromatherapy. Keep all follow-up visits as told by your health care provider. You may need to be monitored closely to make sure treatment is working well for you. Where to find more information Facial Pain Association: fpa-support.org Contact a health care provider if: Your medicine is not helping your symptoms. You have side effects from the medicine used for treatment. You develop new, unexplained symptoms, such as: Double vision. Facial weakness. Facial numbness. Changes in hearing or balance. You feel depressed. Get help right away if: Your pain is severe and is not getting better. You develop suicidal thoughts. If you ever feel like you may hurt yourself or others, or have thoughts about taking your own life, get help right away. You can go to your nearest emergency department or call: Your local emergency services (911 in the [place] A suicide crisis helpline, such as the [facility] at 562-045-7299. This is open 24 hours a day. Summary Trigeminal neuralgia is a nerve disorder that causes severe pain on one side of the face. The pain may last from a few seconds to several minutes. This condition is caused by damage or pressure to a nerve in the head that is called the trigeminal nerve. Treatment may include avoiding the things that trigger your symptoms, taking medicines,  or having surgery or procedures. It may take up to one month for treatment to start relieving the pain. Avoid the things that trigger your symptoms. Keep all follow-up visits as told by your health care provider. You may need to be monitored closely  to make sure treatment is working well for you. This information is not intended to replace advice given to you by your health care provider. Make sure you discuss any questions you have with your healthcare provider. Document Revised: 12/17/2017 Document Reviewed: 12/17/2017 Elsevier Patient Education  2022 [employer].

## 2020-09-29 LAB — CMP14+EGFR
ALT: 17 IU/L (ref 0–44)
AST: 16 IU/L (ref 0–40)
Albumin/Globulin Ratio: 1.8 (ref 1.2–2.2)
Albumin: 4.4 g/dL (ref 3.8–4.8)
Alkaline Phosphatase: 84 IU/L (ref 44–121)
BUN/Creatinine Ratio: 15 (ref 10–24)
BUN: 10 mg/dL (ref 8–27)
Bilirubin Total: 0.3 mg/dL (ref 0.0–1.2)
CO2: 23 mmol/L (ref 20–29)
Calcium: 9.1 mg/dL (ref 8.6–10.2)
Chloride: 103 mmol/L (ref 96–106)
Creatinine, Ser: 0.66 mg/dL — ABNORMAL LOW (ref 0.76–1.27)
Globulin, Total: 2.4 g/dL (ref 1.5–4.5)
Glucose: 108 mg/dL — ABNORMAL HIGH (ref 65–99)
Potassium: 4.6 mmol/L (ref 3.5–5.2)
Sodium: 140 mmol/L (ref 134–144)
Total Protein: 6.8 g/dL (ref 6.0–8.5)
eGFR: 107 mL/min/{1.73_m2} (ref >59)

## 2020-09-29 LAB — CBC WITH DIFFERENTIAL/PLATELET
Basophils Absolute: 0 10*3/uL (ref 0.0–0.2)
Basos: 1 %
EOS (ABSOLUTE): 0.2 10*3/uL (ref 0.0–0.4)
Eos: 3 %
Hematocrit: 43.6 % (ref 37.5–51.0)
Hemoglobin: 14.6 g/dL (ref 13.0–17.7)
Immature Grans (Abs): 0 10*3/uL (ref 0.0–0.1)
Immature Granulocytes: 0 %
Lymphocytes Absolute: 1.6 10*3/uL (ref 0.7–3.1)
Lymphs: 25 %
MCH: 29.7 pg (ref 26.6–33.0)
MCHC: 33.5 g/dL (ref 31.5–35.7)
MCV: 89 fL (ref 79–97)
Monocytes Absolute: 0.5 10*3/uL (ref 0.1–0.9)
Monocytes: 8 %
Neutrophils Absolute: 4 10*3/uL (ref 1.4–7.0)
Neutrophils: 63 %
Platelets: 218 10*3/uL (ref 150–450)
RBC: 4.91 x10E6/uL (ref 4.14–5.80)
RDW: 13.3 % (ref 11.6–15.4)
WBC: 6.4 10*3/uL (ref 3.4–10.8)

## 2020-10-01 ENCOUNTER — Telehealth: Payer: Self-pay

## 2020-10-01 NOTE — Chronic Care Management (AMB) (Signed)
  Chronic Care Management   Note  10/01/2020 Name: NAYDEN CZAJKA MRN: [id number] DOB: August 03, 1960  Jennefer Bravo Colucci is a 60 y.o. year old male who is a primary care patient of [name], FNP. I reached out to [employer] by phone today in response to a referral sent by Mr. Ignace Mandigo Carvey's PCP, [name], FNP      Mr. Fye was given information about Chronic Care Management services today including:  CCM service includes personalized support from designated clinical staff supervised by his physician, including individualized plan of care and coordination with other care providers 24/7 contact phone numbers for assistance for urgent and routine care needs. Service will only be billed when office clinical staff spend 20 minutes or more in a month to coordinate care. Only one practitioner may furnish and bill the service in a calendar month. The patient may stop CCM services at any time (effective at the end of the month) by phone call to the office staff. The patient will be responsible for cost sharing (co-pay) of up to 20% of the service fee (after annual deductible is met).  Patient agreed to services and verbal consent obtained.   Follow up plan: Telephone appointment with care management team member scheduled for: LCSW 10/06/2020 RNCM 10/07/2020  Noreene Larsson, [place], [place], [place] [address] Direct Dial: [phone] [email or web address]: [email or web address]

## 2020-10-01 NOTE — Chronic Care Management (AMB) (Signed)
  Chronic Care Management   Outreach Note  10/01/2020 Name: NICCO YAP MRN: [id number] DOB: 1960-12-30  Jennefer Bravo Westergren is a 60 y.o. year old male who is a primary care patient of [name], FNP. I reached out to [employer] by phone today in response to a referral sent by Mr. Ceylon Maxim Seifert's PCP, [name], FNP      An unsuccessful telephone outreach was attempted today. The patient was referred to the case management team for assistance with care management and care coordination.   Follow Up Plan: The care management team will reach out to the patient again over the next 5 days.  If patient returns call to provider office, please advise to call [facility] at [place], [place], [place], [place] [address] Direct Dial: [phone] [email or web address] Website: [email or web address]

## 2020-10-06 ENCOUNTER — Ambulatory Visit (INDEPENDENT_AMBULATORY_CARE_PROVIDER_SITE_OTHER): Payer: Medicare Other | Admitting: Licensed Clinical Social Worker

## 2020-10-06 DIAGNOSIS — J441 Chronic obstructive pulmonary disease with (acute) exacerbation: Secondary | ICD-10-CM

## 2020-10-06 DIAGNOSIS — M5136 Other intervertebral disc degeneration, lumbar region: Secondary | ICD-10-CM

## 2020-10-06 DIAGNOSIS — E785 Hyperlipidemia, unspecified: Secondary | ICD-10-CM

## 2020-10-06 DIAGNOSIS — F4323 Adjustment disorder with mixed anxiety and depressed mood: Secondary | ICD-10-CM

## 2020-10-06 DIAGNOSIS — Z8673 Personal history of transient ischemic attack (TIA), and cerebral infarction without residual deficits: Secondary | ICD-10-CM

## 2020-10-06 DIAGNOSIS — I10 Essential (primary) hypertension: Secondary | ICD-10-CM

## 2020-10-06 DIAGNOSIS — I4891 Unspecified atrial fibrillation: Secondary | ICD-10-CM

## 2020-10-06 DIAGNOSIS — E1142 Type 2 diabetes mellitus with diabetic polyneuropathy: Secondary | ICD-10-CM

## 2020-10-06 NOTE — Chronic Care Management (AMB) (Signed)
Chronic Care Management    Clinical Social Work Note  10/06/2020 Name: Matthew [name] MRN: [id number] DOB: 1960-08-19  Matthew [name] is a 60 y.o. year old male who is a primary care patient of Matthew Balloon, FNP. The CCM team was consulted to assist the patient with chronic disease management and/or care coordination needs related to: [employer] .   Engaged with patient by telephone for initial visit in response to provider referral for social work chronic care management and care coordination services.   Consent to Services:  The patient was given the following information about Chronic Care Management services today, agreed to services, and gave verbal consent: 1. CCM service includes personalized support from designated clinical staff supervised by the primary care provider, including individualized plan of care and coordination with other care providers 2. 24/7 contact phone numbers for assistance for urgent and routine care needs. 3. Service will only be billed when office clinical staff spend 20 minutes or more in a month to coordinate care. 4. Only one practitioner may furnish and bill the service in a calendar month. 5.The patient may stop CCM services at any time (effective at the end of the month) by phone call to the office staff. 6. The patient will be responsible for cost sharing (co-pay) of up to 20% of the service fee (after annual deductible is met). Patient agreed to services and consent obtained.  Patient agreed to services and consent obtained.   Assessment: Review of patient past medical history, allergies, medications, and health status, including review of relevant consultants reports was performed today as part of a comprehensive evaluation and provision of chronic care management and care coordination services.     SDOH (Social Determinants of Health) assessments and interventions performed: client has stress issues related to management pain issues faced daily.  He  said he is taking medications as prescribed SDOH Interventions    Flowsheet Row Most Recent Value  SDOH Interventions   Depression Interventions/Treatment  --  [informed client of Matthew [name] support and or Matthew [name] support]        Advanced Directives Status: See [employer] application for related entries.  CCM Care Plan  Allergies  Allergen Reactions   Penicillins Other (See Comments)    Did it involve swelling of the face/tongue/throat, SOB, or low BP? Unknown Did it involve sudden or severe rash/hives, skin peeling, or any reaction on the inside of your mouth or nose? Unknown Did you need to seek medical attention at a hospital or doctor's office? Unknown When did it last happen? childhood  If all above answers are "NO", may proceed with cephalosporin use.     Outpatient Encounter Medications as of 10/06/2020  Medication Sig   albuterol (VENTOLIN HFA) 108 (90 Base) MCG/ACT inhaler Inhale 2 puffs into the lungs every 6 (six) hours as needed for wheezing or shortness of breath.   amLODipine (NORVASC) 10 MG tablet Take 1 tablet (10 mg total) by mouth daily. (NEEDS TO BE SEEN BEFORE NEXT REFILL)   apixaban (ELIQUIS) 5 MG TABS tablet Take 1 tablet (5 mg total) by mouth 2 (two) times daily.   atorvastatin (LIPITOR) 40 MG tablet Take 1 tablet (40 mg total) by mouth daily.   baclofen (LIORESAL) 10 MG tablet Take 1 tablet (10 mg total) by mouth 3 (three) times daily as needed for muscle spasms.   blood glucose meter kit and supplies Dispense based on patient and insurance preference. Use up to four times daily as directed. (FOR  ICD-10 E10.9, E11.9).   carvedilol (COREG) 3.125 MG tablet Take 1 tablet (3.125 mg total) by mouth 2 (two) times daily with a meal.   DULoxetine (CYMBALTA) 60 MG capsule Take 1 capsule (60 mg total) by mouth daily. (NEEDS TO BE SEEN BEFORE NEXT REFILL)   fluticasone (FLONASE) 50 MCG/ACT nasal spray Place 2 sprays into both nostrils daily.   Fluticasone-Umeclidin-Vilant (TRELEGY  ELLIPTA) 100-62.5-25 MCG/INH AEPB INHALE 1 PUFF INTO THE LUNGS EVERY DAY   gabapentin (NEURONTIN) 800 MG tablet Take 1 tablet (800 mg total) by mouth in the morning, at noon, in the evening, and at bedtime. (NEEDS TO BE SEEN BEFORE NEXT REFILL)   glucose blood test strip Test BS daily Dx E11.65   levETIRAcetam (KEPPRA) 500 MG tablet Take 1 tablet (500 mg total) by mouth 2 (two) times daily.   lisinopril (ZESTRIL) 5 MG tablet Take 1 tablet (5 mg total) by mouth daily.   loratadine (CLARITIN) 10 MG tablet Take 1 tablet (10 mg total) by mouth daily.   magic mouthwash w/lidocaine SOLN Take 10 mLs by mouth 3 (three) times daily as needed for mouth pain.   metFORMIN (GLUCOPHAGE) 500 MG tablet Take 1 tablet (500 mg total) by mouth daily with breakfast.   Vitamin D, Ergocalciferol, (DRISDOL) 1.25 MG (50000 UNIT) CAPS capsule Take 1 capsule (50,000 Units total) by mouth every 7 (seven) days.   No facility-administered encounter medications on file as of 10/06/2020.    Patient Active Problem List   Diagnosis Date Noted   Type 2 diabetes mellitus with diabetic polyneuropathy, without long-term current use of insulin ([facility]) 05/27/2019   Perirectal abscess 08/28/2018   PAF (paroxysmal atrial fibrillation) ([facility]) 08/28/2018   Unable to read or write 05/31/2018   Canker sores oral    Bradycardia    Hypomagnesemia    Hypokalemia 03/20/2018   Atrial fibrillation with RVR ([facility]) 03/20/2018   Hyponatremia 03/20/2018   DDD (degenerative disc disease), lumbar 11/13/2017   Femoroacetabular impingement of both hips 11/13/2017   Late effect of cerebrovascular accident (CVA) 10/25/2017   H/O: CVA (cerebrovascular accident) 10/22/2017   Tobacco use 10/22/2017   Depression 12/25/2016   Abnormal drug screen 11/02/2016   Pain management contract broken 03/09/2016   Coronary artery calcification 01/18/2016   Thoracic aortic atherosclerosis ([facility]) 01/18/2016   Chronic back pain 08/03/2015   Obesity (BMI 30-39.9)  03/09/2015   Erectile dysfunction 08/24/2014   Vitamin D deficiency 04/18/2014   Hyperlipidemia 04/18/2014   COPD (chronic obstructive pulmonary disease) ([facility]) 04/17/2014   Chronic pain syndrome 08/19/2012   Adjustment disorder with mixed anxiety and depressed mood 05/11/2012   Trigeminal neuralgia 05/11/2012   HTN (hypertension) 05/11/2012    Conditions to be addressed/monitored:  monitor client management of pain issues faced  Care Plan : Matthew [name] Care Plan  Updates made by [name], Matthew [name] since 10/06/2020 12:00 AM     Problem: Coping Skills (General Plan of Care)      Goal: Coping Skills Enhanced; Manage Pain issues   Start Date: 10/06/2020  Expected End Date: 01/05/2021  This Visit's Progress: On track  Priority: Medium  Note:   Current barriers:   Patient in need of assistance with connecting to community resources for possible help with daily activities completion Patient is unable to independently navigate community resource options without care coordination support Pain issues daily Some transport challenges  Clinical Goals:  Client to call Matthew [name] as needed in next 30 days to discuss pain issues of client and  his management of pain issues faced Client to call Matthew [name] as needed in next 30 days for nursing support Client to attend all scheduled medical appointments in next 30 days  Clinical Interventions:  Collaboration with Matthew Balloon, FNP regarding development and update of comprehensive plan of care as evidenced by provider attestation and co-signature Assessment of needs, barriers  of client Talked with Matthew [name] about pain issues faced (he said he has pain from his temple down to his mouth each day.).  Client is taking medications as prescribed Talked with Matthew [name] about ADLs completion of client Talked with client about medication procurement Talked with client about sleeping issues of client  Talked with Matthew [name] about appetite of client Talked  with Matthew [name] about  transport needs of client .   Talked with Matthew [name] about upcoming MRI. He said he will have MRI test next month. Talked with Matthew [name] about CCM support with Matthew [name] and with Matthew [name] Talked with Matthew [name] about family support. He said he has support from his son, Matthew [name] Talked with Matthew [name] about ambulation of client. He said he is walking well  Patient Coping Skills: Completes ADLs daily Takes medications as prescribed Has support from son, Matthew [name]  Patient Deficits: Pain issues Some communication challenges (history of CVA)  Patient Goals:   Client to call Matthew [name] or Matthew [name] as needed for CCM support in next 30 days In next 30 days, client to attend scheduled medical appointments Client to practice self care, to allow time for rest and relaxation in next 30 days -  Follow Up Plan: Matthew [name] to call client on 11/16/20 to assess client needs     Matthew [name].Alanee Ting MSW, Matthew [name] Licensed Clinical Social Worker [facility] Care Management [phone]

## 2020-10-06 NOTE — Patient Instructions (Signed)
Visit Information  PATIENT GOALS:  Goals Addressed             This Visit's Progress    Manage Emotions; Manage Pain issues faced daily       Timeframe:  Short-Term Goal Priority:  Medium Progress: On track Start Date: 10/06/20                             Expected End Date 01/05/21:                       Follow Up Date 11/16/20    Manage Emotions; Manage Pain issues faced daily    Why is this important?   When you are stressed, down or upset, your body reacts too.  For example, your blood pressure may get higher; you may have a headache or stomachache.  When your emotions get the best of you, your body's ability to fight off cold and flu gets weak.  These steps will help you manage your emotions.    Patient Coping Skills: Completes ADLs daily Takes medications as prescribed Has support from son, Bradie Iaquinto  Patient Deficits: Pain issues Some communication challenges (history of CVA)  Patient Goals:   Client to call RNCM or LCSW as needed for CCM support in next 30 days In next 30 days, client to attend scheduled medical appointments Client to practice self care, to allow time for rest and relaxation in next 30 days -  Follow Up Plan: LCSW to call client on 11/16/20 to assess client needs     Norva Riffle.Shahan Starks MSW, LCSW Licensed Clinical Social Worker [facility] Care Management [phone]

## 2020-10-07 ENCOUNTER — Encounter: Payer: Self-pay | Admitting: *Deleted

## 2020-10-07 ENCOUNTER — Other Ambulatory Visit: Payer: Self-pay | Admitting: *Deleted

## 2020-10-07 ENCOUNTER — Ambulatory Visit: Payer: Medicare Other | Admitting: *Deleted

## 2020-10-07 DIAGNOSIS — E1142 Type 2 diabetes mellitus with diabetic polyneuropathy: Secondary | ICD-10-CM

## 2020-10-07 DIAGNOSIS — I10 Essential (primary) hypertension: Secondary | ICD-10-CM

## 2020-10-07 DIAGNOSIS — G500 Trigeminal neuralgia: Secondary | ICD-10-CM

## 2020-10-07 MED ORDER — MAGIC MOUTHWASH W/LIDOCAINE: 10.0000 mL | Freq: Three times a day (TID) | ORAL | 2 refills | Status: DC | PRN

## 2020-10-11 MED ORDER — MAGIC MOUTHWASH W/LIDOCAINE: 10.0000 mL | Freq: Three times a day (TID) | ORAL | 2 refills | Status: DC | PRN

## 2020-10-11 NOTE — Chronic Care Management (AMB) (Signed)
Chronic Care Management   CCM RN Visit Note  10/07/2020 Name: Matthew [name] MRN: [id number] DOB: Mar 19, 1960  Subjective: ESTEFAN [name] is a 60 y.o. year old male who is a primary care patient of [name], FNP. The care management team was consulted for assistance with disease management and care coordination needs.    Engaged with patient by telephone for follow up visit in response to provider referral for case management and/or care coordination services.   Consent to Services:  The patient was given information about Chronic Care Management services, agreed to services, and gave verbal consent prior to initiation of services.  Please see initial visit note for detailed documentation.   Patient agreed to services and verbal consent obtained.   Assessment: Review of patient past medical history, allergies, medications, health status, including review of consultants reports, laboratory and other test data, was performed as part of comprehensive evaluation and provision of chronic care management services.   SDOH (Social Determinants of Health) assessments and interventions performed:  SDOH Interventions    Flowsheet Row Most Recent Value  SDOH Interventions   Transportation Interventions Other (Comment)  [Relies on son to take him to appointments. May need transportation assistance at times. Will work with Care Guides to coordinate as needed.]        [place]  Allergies  Allergen Reactions   Penicillins Other (See Comments)    Did it involve swelling of the face/tongue/throat, SOB, or low BP? Unknown Did it involve sudden or severe rash/hives, skin peeling, or any reaction on the inside of your mouth or nose? Unknown Did you need to seek medical attention at a hospital or doctor's office? Unknown When did it last happen? childhood  If all above answers are "NO", may proceed with cephalosporin use.     Outpatient Encounter Medications as of 10/07/2020  Medication Sig    albuterol (VENTOLIN HFA) 108 (90 Base) MCG/ACT inhaler Inhale 2 puffs into the lungs every 6 (six) hours as needed for wheezing or shortness of breath.   amLODipine (NORVASC) 10 MG tablet Take 1 tablet (10 mg total) by mouth daily. (NEEDS TO BE SEEN BEFORE NEXT REFILL)   apixaban (ELIQUIS) 5 MG TABS tablet Take 1 tablet (5 mg total) by mouth 2 (two) times daily.   atorvastatin (LIPITOR) 40 MG tablet Take 1 tablet (40 mg total) by mouth daily.   baclofen (LIORESAL) 10 MG tablet Take 1 tablet (10 mg total) by mouth 3 (three) times daily as needed for muscle spasms.   blood glucose meter kit and supplies Dispense based on patient and insurance preference. Use up to four times daily as directed. (FOR ICD-10 E10.9, E11.9).   carvedilol (COREG) 3.125 MG tablet Take 1 tablet (3.125 mg total) by mouth 2 (two) times daily with a meal.   DULoxetine (CYMBALTA) 60 MG capsule Take 1 capsule (60 mg total) by mouth daily. (NEEDS TO BE SEEN BEFORE NEXT REFILL)   fluticasone (FLONASE) 50 MCG/ACT nasal spray Place 2 sprays into both nostrils daily.   Fluticasone-Umeclidin-Vilant (TRELEGY ELLIPTA) 100-62.5-25 MCG/INH AEPB INHALE 1 PUFF INTO THE LUNGS EVERY DAY   gabapentin (NEURONTIN) 800 MG tablet Take 1 tablet (800 mg total) by mouth in the morning, at noon, in the evening, and at bedtime. (NEEDS TO BE SEEN BEFORE NEXT REFILL)   glucose blood test strip Test BS daily Dx E11.65   levETIRAcetam (KEPPRA) 500 MG tablet Take 1 tablet (500 mg total) by mouth 2 (two) times daily.  lisinopril (ZESTRIL) 5 MG tablet Take 1 tablet (5 mg total) by mouth daily.   loratadine (CLARITIN) 10 MG tablet Take 1 tablet (10 mg total) by mouth daily.   metFORMIN (GLUCOPHAGE) 500 MG tablet Take 1 tablet (500 mg total) by mouth daily with breakfast.   Vitamin D, Ergocalciferol, (DRISDOL) 1.25 MG (50000 UNIT) CAPS capsule Take 1 capsule (50,000 Units total) by mouth every 7 (seven) days.   [DISCONTINUED] magic mouthwash w/lidocaine SOLN  Take 10 mLs by mouth 3 (three) times daily as needed for mouth pain.   No facility-administered encounter medications on file as of 10/07/2020.    Patient Active Problem List   Diagnosis Date Noted   Type 2 diabetes mellitus with diabetic polyneuropathy, without long-term current use of insulin ([place]) 05/27/2019   Perirectal abscess 08/28/2018   PAF (paroxysmal atrial fibrillation) ([place]) 08/28/2018   Unable to read or write 05/31/2018   Canker sores oral    Bradycardia    Hypomagnesemia    Hypokalemia 03/20/2018   Atrial fibrillation with RVR ([place]) 03/20/2018   Hyponatremia 03/20/2018   DDD (degenerative disc disease), lumbar 11/13/2017   Femoroacetabular impingement of both hips 11/13/2017   Late effect of cerebrovascular accident (CVA) 10/25/2017   H/O: CVA (cerebrovascular accident) 10/22/2017   Tobacco use 10/22/2017   Depression 12/25/2016   Abnormal drug screen 11/02/2016   Pain management contract broken 03/09/2016   Coronary artery calcification 01/18/2016   Thoracic aortic atherosclerosis ([place]) 01/18/2016   Chronic back pain 08/03/2015   Obesity (BMI 30-39.9) 03/09/2015   Erectile dysfunction 08/24/2014   Vitamin D deficiency 04/18/2014   Hyperlipidemia 04/18/2014   COPD (chronic obstructive pulmonary disease) ([place]) 04/17/2014   Chronic pain syndrome 08/19/2012   Adjustment disorder with mixed anxiety and depressed mood 05/11/2012   Trigeminal neuralgia 05/11/2012   HTN (hypertension) 05/11/2012    Conditions to be addressed/monitored:HTN, DMII, and chronic pain associated with trigeminal neuralgia  Care Plan : [facility] Care Plan   Problem: Chronic Disease Management Needs   Priority: Medium     Long-Range Goal: Work with [facility] Regarding Care Coordination and Care Management Associated with HTN, DM, latent effects of CVA, and Trigeminal Neuralgia   Start Date: 10/07/2020  This Visit's Progress: On track  Priority: Medium  Note:   Current Barriers:  Chronic  Disease Management support and education needs related to HTN, DMII, and chronic pain due to trigeminal neuralgia Transportation barriers  RNCM Clinical Goal(s):  Patient will take all medications exactly as prescribed and will call provider for medication related questions continue to work with RN Care Manager to address care management and care coordination needs related to HTN, DMII, and trigeminal neuarlgia  through collaboration with RN Care manager, provider, and care team.   Interventions: 1:1 collaboration with primary care provider regarding development and update of comprehensive plan of care as evidenced by provider attestation and co-signature Inter-disciplinary care team collaboration (see longitudinal plan of care) Evaluation of current treatment plan related to  self management and patient's adherence to plan as established by provider Provided with [facility] telephone number and mailed business card to home address. Encouraged patient to reach out as needed.    Diabetes:  (Status: New goal.) Lab Results  Component Value Date   HGBA1C 5.7 09/28/2020  Assessed patient's understanding of A1c goal: <7% Provided education to patient about basic DM disease process; Reviewed medications with patient and discussed importance of medication adherence;        Reviewed prescribed diet with patient  ADA/Carb Modified ; Counseled on importance of regular laboratory monitoring as prescribed;        Discussed plans with patient for ongoing care management follow up and provided patient with direct contact information for care management team;      Advised patient, providing education and rationale, to check cbg daily and PRN and record        Review of patient status, including review of consultants reports, relevant laboratory and other test results, and medications completed;       Assessed social determinant of health barriers;         Hypertension: (Status: New goal.) Last practice  recorded BP readings:  BP Readings from Last 3 Encounters:  09/28/20 133/79  06/21/20 139/79  05/19/20 128/78  Most recent eGFR/CrCl:  Lab Results  Component Value Date   EGFR 107 09/28/2020    No components found for: CRCL  Evaluation of current treatment plan related to hypertension self management and patient's adherence to plan as established by provider;   Reviewed prescribed diet low sodium/DASH diet Reviewed medications with patient and discussed importance of compliance;  Counseled on the importance of exercise goals with target of 150 minutes per week Discussed plans with patient for ongoing care management follow up and provided patient with direct contact information for care management team; Advised patient, providing education and rationale, to monitor blood pressure daily and record, calling PCP for findings outside established parameters;  Discussed complications of poorly controlled blood pressure such as heart disease, stroke, circulatory complications, vision complications, kidney impairment, sexual dysfunction;   Pain:  (Status: New goal.) Pain assessment performed Medications reviewed and discussed with patient. Reviewed provider established plan for pain management; Discussed importance of adherence to all scheduled medical appointments; Counseled on the importance of reporting any/all new or changed pain symptoms or management strategies to pain management provider; Advised patient to report to care team affect of pain on daily activities; Discussed use of relaxation techniques and/or diversional activities to assist with pain reduction (distraction, imagery, relaxation, massage, acupressure, TENS, heat, and cold application; Reviewed with patient prescribed pharmacological and nonpharmacological pain relief strategies; Advised patient to discuss difficulty chewing and eating due to pain in tongue/mouth from trigeminal neuralgia with provider; Discussed typical diet  and influence of pain on eating habits Encouraged patient to supplement with sugar free Boost, Ensure, Glucerna, if unable to eat meals regularly due to pain Encouraged patient to keep upcoming new patient appt with neurologist Therapeutic listening utilized regarding pain and how it affects his daily life Discussed that medications do not manage pain very well Encouraged patient talk with LCSW regarding psychosocial concerns Discussed family/social support Discussed potential transportation assistance need to appt with neurologist. Family may be able to take him.  Patient Goals/Self-Care Activities: Patient will self administer medications as prescribed Patient will attend all scheduled provider appointments Patient will call pharmacy for medication refills Patient will continue to perform ADL's independently Patient will continue to perform IADL's independently Patient will call provider office for new concerns or questions         Plan:Telephone follow up appointment with care management team member scheduled for:  11/06/20 and The patient has been provided with contact information for the care management team and has been advised to call with any health related questions or concerns.   Chong Sicilian, BSN, RN-BC Embedded Chronic Care Manager [facility] / [place] [facility] Direct Dial: [phone]

## 2020-10-11 NOTE — Addendum Note (Signed)
Addended by: Antonietta Barcelona D on: 10/11/2020 05:10 PM   Modules accepted: Orders

## 2020-10-11 NOTE — Patient Instructions (Signed)
Visit Information  PATIENT GOALS:  Goals Addressed             This Visit's Progress    [place] My Quality of Life   On track    Timeframe:  Long-Range Goal Priority:  Medium Start Date: 10/07/20                            Expected End Date: 10/07/21                      Follow Up Date 11/06/20    Talk with family/friends daily Get outside once a day Talk with LCSW regarding psychosocial concerns Call RN Care Manager as needed [phone] Keep appt with neurologist to discuss pain associated with trigeminal neuralgia Take medication as needed Let RNCM know if you need help with transportation to your neurology appointment   Why is this important?   Having a long-term illness can be scary.  It can also be stressful for you and your caregiver.  These steps may help.    Notes:      Monitor and Manage My Blood Sugar-Diabetes Type 2   On track    Timeframe:  Long-Range Goal Priority:  Medium Start Date:     10/07/20                        Expected End Date:  10/07/21                     Follow Up Date 11/06/20    Check and record blood sugar daily Call PCP with any readings outside of recommended range Eat meals regularly Avoid foods high in sugar or simple carbs Keep all scheduled medical appointments   Why is this important?   Checking your blood sugar at home helps to keep it from getting very high or very low.  Writing the results in a diary or log helps the doctor know how to care for you.  Your blood sugar log should have the time, date and the results.  Also, write down the amount of insulin or other medicine that you take.  Other information, like what you ate, exercise done and how you were feeling, will also be helpful.     Notes:      Track and Manage My Blood Pressure-Hypertension   On track    Timeframe:  Long-Range Goal Priority:  Medium Start Date:   10/07/20                          Expected End Date: 10/07/21                      Follow Up Date  11/06/20    Check and record blood pressure daily Call PCP with any readings outside of recommended range Take medication as prescribed Keep all medical appointments Follow a low sodium/DASH diet Call RN Care Manager as needed [phone]    Why is this important?   You won't feel high blood pressure, but it can still hurt your blood vessels.  High blood pressure can cause heart or kidney problems. It can also cause a stroke.  Making lifestyle changes like losing a little weight or eating less salt will help.  Checking your blood pressure at home and at different times of the day can help to control  blood pressure.  If the doctor prescribes medicine remember to take it the way the doctor ordered.  Call the office if you cannot afford the medicine or if there are questions about it.     Notes:         Patient verbalizes understanding of instructions provided today and agrees to view in [place].     Plan:Telephone follow up appointment with care management team member scheduled for:  11/06/20 and The patient has been provided with contact information for the care management team and has been advised to call with any health related questions or concerns.    Chong Sicilian, BSN, RN-BC Embedded Chronic Care Manager [facility] / [place] [facility] Direct Dial: [phone]

## 2020-10-12 ENCOUNTER — Telehealth: Payer: Medicare Other

## 2020-10-13 DIAGNOSIS — I4891 Unspecified atrial fibrillation: Secondary | ICD-10-CM | POA: Diagnosis not present

## 2020-10-13 DIAGNOSIS — F4323 Adjustment disorder with mixed anxiety and depressed mood: Secondary | ICD-10-CM | POA: Diagnosis not present

## 2020-10-13 DIAGNOSIS — E785 Hyperlipidemia, unspecified: Secondary | ICD-10-CM | POA: Diagnosis not present

## 2020-10-13 DIAGNOSIS — I10 Essential (primary) hypertension: Secondary | ICD-10-CM | POA: Diagnosis not present

## 2020-10-13 DIAGNOSIS — J441 Chronic obstructive pulmonary disease with (acute) exacerbation: Secondary | ICD-10-CM

## 2020-10-13 DIAGNOSIS — E1142 Type 2 diabetes mellitus with diabetic polyneuropathy: Secondary | ICD-10-CM

## 2020-10-15 ENCOUNTER — Other Ambulatory Visit: Payer: Self-pay

## 2020-10-15 ENCOUNTER — Ambulatory Visit (INDEPENDENT_AMBULATORY_CARE_PROVIDER_SITE_OTHER): Payer: Medicare Other | Admitting: Neurology

## 2020-10-15 ENCOUNTER — Encounter: Payer: Self-pay | Admitting: Neurology

## 2020-10-15 VITALS — BP 135/75 | HR 51 | Resp 18 | Ht 71.0 in | Wt 206.0 lb

## 2020-10-15 DIAGNOSIS — G500 Trigeminal neuralgia: Secondary | ICD-10-CM

## 2020-10-15 DIAGNOSIS — M26621 Arthralgia of right temporomandibular joint: Secondary | ICD-10-CM | POA: Diagnosis not present

## 2020-10-15 DIAGNOSIS — R6884 Jaw pain: Secondary | ICD-10-CM | POA: Diagnosis not present

## 2020-10-15 MED ORDER — OXCARBAZEPINE 150 MG PO TABS: 150.0000 mg | ORAL_TABLET | Freq: Two times a day (BID) | ORAL | 3 refills | Status: DC

## 2020-10-15 NOTE — Patient Instructions (Addendum)
Start taking oxcarbamazepine '150mg'$  twice daily  Reduce levetiracetam to '500mg'$  daily x 1 weeks, then stop.  Continue gabapentin '800mg'$  four times daily  Continue Cymbalta '60mg'$  daily  Continue baclofen '10mg'$  three times daily  Call with update in 2 weeks  MRI trigeminal nerve wwo contrast  Return to clinic 3 months

## 2020-10-15 NOTE — Progress Notes (Signed)
New Patient Visit   Date: 10/15/20    ALTIN SEASE MRN: [id number] DOB: [birth year]   Interim History: Matthew [name] is a 60 y.o. right-handed Caucasian male with asthma, COPD, hypertension, hyperlipidemia, atrial fibrillation, right basal ganglia stroke (left hemiparesis, 2019), and current tobacco user returning for for evaluation of right trigeminal neuralgia.   Patient is illiterate and requests healthcare information be discussed with his son, Matthew [name], who is here today.   Patient was last seen in June 2019 for right trigeminal neuralgia.  Since this time, he has continued to have chronic pain and discomfort.  He was on carbamazepine 400mg  BID but this was switched to Keppra 500mg  BID after he was hospitalized with atrial fibrillation with RVR in February 2019 and found to also be hyponatremic (124). He currently takes:  gabapentin 800mg  four times daily, levetiracetam 500mg  BID, cymbalta 60mg , and baclofen 10mg  TID.  He continues to have almost constant right facial pain. When severe, he stops talking and slurred his speech to avoid moving is mouth because it triggers pain.   Medications:  Current Outpatient Medications on File Prior to Visit  Medication Sig Dispense Refill   albuterol (VENTOLIN HFA) 108 (90 Base) MCG/ACT inhaler Inhale 2 puffs into the lungs every 6 (six) hours as needed for wheezing or shortness of breath. 18 each 1   amLODipine (NORVASC) 10 MG tablet Take 1 tablet (10 mg total) by mouth daily. (NEEDS TO BE SEEN BEFORE NEXT REFILL) 90 tablet 3   apixaban (ELIQUIS) 5 MG TABS tablet Take 1 tablet (5 mg total) by mouth 2 (two) times daily. 180 tablet 1   atorvastatin (LIPITOR) 40 MG tablet Take 1 tablet (40 mg total) by mouth daily. 90 tablet 4   baclofen (LIORESAL) 10 MG tablet Take 1 tablet (10 mg total) by mouth 3 (three) times daily as needed for muscle spasms. 90 each 1   blood glucose meter kit and supplies Dispense based on patient and insurance preference.  Use up to four times daily as directed. (FOR ICD-10 E10.9, E11.9). 1 each 0   carvedilol (COREG) 3.125 MG tablet Take 1 tablet (3.125 mg total) by mouth 2 (two) times daily with a meal. 60 tablet 2   DULoxetine (CYMBALTA) 60 MG capsule Take 1 capsule (60 mg total) by mouth daily. (NEEDS TO BE SEEN BEFORE NEXT REFILL) 90 capsule 3   fluticasone (FLONASE) 50 MCG/ACT nasal spray Place 2 sprays into both nostrils daily. 48 mL 1   Fluticasone-Umeclidin-Vilant (TRELEGY ELLIPTA) 100-62.5-25 MCG/INH AEPB INHALE 1 PUFF INTO THE LUNGS EVERY DAY 180 each 1   gabapentin (NEURONTIN) 800 MG tablet Take 1 tablet (800 mg total) by mouth in the morning, at noon, in the evening, and at bedtime. (NEEDS TO BE SEEN BEFORE NEXT REFILL) 120 tablet 3   glucose blood test strip Test BS daily Dx E11.65 100 each 3   levETIRAcetam (KEPPRA) 500 MG tablet Take 1 tablet (500 mg total) by mouth 2 (two) times daily. 180 tablet 2   lisinopril (ZESTRIL) 5 MG tablet Take 1 tablet (5 mg total) by mouth daily. 90 tablet 1   loratadine (CLARITIN) 10 MG tablet Take 1 tablet (10 mg total) by mouth daily. 90 tablet 3   magic mouthwash w/lidocaine SOLN Take 10 mLs by mouth 3 (three) times daily as needed for mouth pain. 500 mL 2   metFORMIN (GLUCOPHAGE) 500 MG tablet Take 1 tablet (500 mg total) by mouth daily with breakfast. 90 tablet  4   Vitamin D, Ergocalciferol, (DRISDOL) 1.25 MG (50000 UNIT) CAPS capsule Take 1 capsule (50,000 Units total) by mouth every 7 (seven) days. 12 capsule 3   No current facility-administered medications on file prior to visit.    Allergies:  Allergies  Allergen Reactions   Penicillins Other (See Comments)    Did it involve swelling of the face/tongue/throat, SOB, or low BP? Unknown Did it involve sudden or severe rash/hives, skin peeling, or any reaction on the inside of your mouth or nose? Unknown Did you need to seek medical attention at a hospital or doctor's office? Unknown When did it last happen?  childhood  If all above answers are "NO", may proceed with cephalosporin use.     Vital Signs:  BP 135/75   Pulse (!) 51   Resp 18   Ht 5\' 11"  (1.803 m)   Wt 206 lb (93.4 kg)   SpO2 97%   BMI 28.73 kg/m   Neurological Exam: MENTAL STATUS including orientation to time, place, person, recent and remote memory, attention span and concentration, language, and fund of knowledge is normal.  Speech is severely dysarthric due to pain  CRANIAL NERVES:  Pupils equal round and reactive to light.  Normal conjugate, extra-ocular eye movements in all directions of gaze.  No ptosis. Face is symmetric. Facial sensation intact.   MOTOR:  Motor strength is 5/5 in all extremities  REFLEXES:  Reflexes are 2+/4 throughout  SENSORY:  Vibration is intact throughout  COORDINATION/GAIT:  Gait narrow based and stable.   Data: CT head wo contrast 08/21/2012: 1. No acute intracranial abnormality. 2.  Chronic vertebrobasilar dolichoectasia. 3.  Evidence of chronic small vessel ischemia which is new or progressed since 2010  MRI head 10/22/2017: 1. Limited exam due to patient's inability to tolerate the full length of the study as well as extensive motion artifact. 2. 15 mm acute ischemic lacunar type infarct involving the posterior right basal ganglia. 3. Additional 9 mm focus of diffusion abnormality involving the right periventricular white matter, most consistent with sequelae of subacute small vessel ischemic changes. 4. Atrophy with moderate chronic small vessel ischemic disease.  MRA head 10/22/2017: 1. Technically limited exam due to extensive motion artifact. 2. Negative intracranial MRA for large vessel occlusion. No obvious hemodynamically significant or correctable stenosis identified.  IMPRESSION/PLAN: Right trigeminal neuralgia (onset early 2000s), refractory.  He was controlled on carbamazepine however due to hyponatremia in 2019 and interaction with eliquis, this was discontinued.  -  Start oxcarbamazepine 150mg  BID.  Patient to give update in 2 weeks  - Discontinue Keppra by tapering to 500mg  daily x 1 week, then stop  - Continue gabapentin 800mg  QID  - Continue Cymbalta 60mg  daily  - Continue baclofen 10mg  TID   - MRI trigeminal nerve protocol  2. Dysarthria due to facial pain and avoidance of moving muscle to talk  3.  History of right basal ganglia stroke (2019), no residual weakness.  He was diagnosed with atrial fibrillation in January 2020 and takes eliquis.  He is on statin therapy and BP management as per PCP.  Return to clinic in 3-4 months  Total time spent reviewing records, interview, history/exam, documentation, and coordination of care on day of encounter:  50 min   Thank you for allowing me to participate in patient's care.  If I can answer any additional questions, I would be pleased to do so.    Sincerely,    Garris Melhorn K. Posey Pronto, DO

## 2020-10-19 ENCOUNTER — Telehealth: Payer: Self-pay | Admitting: Neurology

## 2020-10-19 NOTE — Telephone Encounter (Signed)
Pt's son called in stating he wanted to give an update on the patient's medications. The patient called him this morning stating taking less of the seizure medication is making his pain worse. He could not remember the name of the medication.

## 2020-10-20 NOTE — Telephone Encounter (Signed)
Called patient and informed him that he can go back to taking Levitiracetam '500mg'$  twice daily. Patient verbalized understanding and had no further questions or concerns.

## 2020-10-20 NOTE — Telephone Encounter (Signed)
Please instruct patient to go back to taking Levitiracetam '500mg'$  twice daily.  Thanks.

## 2020-10-21 ENCOUNTER — Other Ambulatory Visit: Payer: Self-pay | Admitting: Family

## 2020-10-21 DIAGNOSIS — G500 Trigeminal neuralgia: Secondary | ICD-10-CM

## 2020-10-22 ENCOUNTER — Telehealth: Payer: Self-pay | Admitting: *Deleted

## 2020-10-22 ENCOUNTER — Ambulatory Visit: Payer: Medicare Other | Admitting: *Deleted

## 2020-10-22 DIAGNOSIS — G500 Trigeminal neuralgia: Secondary | ICD-10-CM

## 2020-10-22 DIAGNOSIS — I10 Essential (primary) hypertension: Secondary | ICD-10-CM

## 2020-10-22 DIAGNOSIS — E1142 Type 2 diabetes mellitus with diabetic polyneuropathy: Secondary | ICD-10-CM

## 2020-10-22 MED ORDER — MAGIC MOUTHWASH W/LIDOCAINE: 10.0000 mL | Freq: Three times a day (TID) | ORAL | 2 refills | Status: DC | PRN

## 2020-10-22 NOTE — Telephone Encounter (Signed)
Please review

## 2020-10-22 NOTE — Telephone Encounter (Signed)
10/22/2020  Requesting refill of magic mouth wash to [facility]. It was refilled on 10/11/20 but was set to "print" and so it didn't electronically go to the pharmacy. Patient is out and would like to pickup today.   Forwarding to [facility] Clinical Staff.   Chong Sicilian, BSN, RN-BC Embedded Chronic Care Manager [facility] / [place] [facility] Direct Dial: [phone]

## 2020-10-22 NOTE — Telephone Encounter (Signed)
Faxed to [facility]

## 2020-11-03 ENCOUNTER — Ambulatory Visit: Payer: Medicare Other | Admitting: *Deleted

## 2020-11-03 DIAGNOSIS — E1142 Type 2 diabetes mellitus with diabetic polyneuropathy: Secondary | ICD-10-CM

## 2020-11-03 DIAGNOSIS — I10 Essential (primary) hypertension: Secondary | ICD-10-CM

## 2020-11-03 DIAGNOSIS — G500 Trigeminal neuralgia: Secondary | ICD-10-CM

## 2020-11-03 NOTE — Patient Instructions (Signed)
Visit Information  PATIENT GOALS:  Goals Addressed             This Visit's Progress    [place]ain My Quality of Life   On track    Timeframe:  Long-Range Goal Priority:  Medium Start Date: 10/07/20                            Expected End Date: 10/07/21                      Follow Up Date 11/19/20    Talk with family/friends daily Get outside once a day Talk with LCSW regarding psychosocial concerns Call RN Care Manager as needed [phone] Keep appointment for MRI on 11/08/20 Take gabapentin as prescribed. Try to get in that 4th dose before going to bed.   Why is this important?   Having a long-term illness can be scary.  It can also be stressful for you and your caregiver.  These steps may help.    Notes:      Monitor and Manage My Blood Sugar-Diabetes Type 2   On track    Timeframe:  Long-Range Goal Priority:  Medium Start Date:     10/07/20                        Expected End Date:  10/07/21                     Follow Up Date 11/19/20    Check and record blood sugar daily Call PCP with any readings outside of recommended range Eat meals regularly Avoid foods high in sugar or simple carbs Keep all scheduled medical appointments   Why is this important?   Checking your blood sugar at home helps to keep it from getting very high or very low.  Writing the results in a diary or log helps the doctor know how to care for you.  Your blood sugar log should have the time, date and the results.  Also, write down the amount of insulin or other medicine that you take.  Other information, like what you ate, exercise done and how you were feeling, will also be helpful.     Notes:      Track and Manage My Blood Pressure-Hypertension   On track    Timeframe:  Long-Range Goal Priority:  Medium Start Date:   10/07/20                          Expected End Date: 10/07/21                      Follow Up Date 11/19/20  Check and record blood pressure daily Call PCP with any  readings outside of recommended range Take medication as prescribed Keep all medical appointments Follow a low sodium/DASH diet Call RN Care Manager as needed [phone]    Why is this important?   You won't feel high blood pressure, but it can still hurt your blood vessels.  High blood pressure can cause heart or kidney problems. It can also cause a stroke.  Making lifestyle changes like losing a little weight or eating less salt will help.  Checking your blood pressure at home and at different times of the day can help to control blood pressure.  If the doctor prescribes medicine remember  to take it the way the doctor ordered.  Call the office if you cannot afford the medicine or if there are questions about it.     Notes:         Patient verbalizes understanding of instructions provided today and agrees to view in [place].   Plan:Telephone follow up appointment with care management team member scheduled for:  11/19/20 with RNCM and The patient has been provided with contact information for the care management team and has been advised to call with any health related questions or concerns.   Chong Sicilian, BSN, RN-BC Embedded Chronic Care Manager [facility] / [place] [facility] Direct Dial: [phone]

## 2020-11-03 NOTE — Chronic Care Management (AMB) (Signed)
Chronic Care Management   CCM RN Visit Note  11/03/2020 Name: Matthew [name] MRN: [id number] DOB: [birth year]  Subjective: ABDULKAREEM [name] is a 60 y.o. year old male who is a primary care patient of [name], FNP. The care management team was consulted for assistance with disease management and care coordination needs.    Engaged with patient by telephone for follow up visit in response to provider referral for case management and/or care coordination services.   Consent to Services:  The patient was given information about Chronic Care Management services, agreed to services, and gave verbal consent prior to initiation of services.  Please see initial visit note for detailed documentation.   Patient agreed to services and verbal consent obtained.   Assessment: Review of patient past medical history, allergies, medications, health status, including review of consultants reports, laboratory and other test data, was performed as part of comprehensive evaluation and provision of chronic care management services.   SDOH (Social Determinants of Health) assessments and interventions performed:    CCM Care Plan  Allergies  Allergen Reactions   Penicillins Other (See Comments)    Did it involve swelling of the face/tongue/throat, SOB, or low BP? Unknown Did it involve sudden or severe rash/hives, skin peeling, or any reaction on the inside of your mouth or nose? Unknown Did you need to seek medical attention at a hospital or doctor's office? Unknown When did it last happen? childhood  If all above answers are "NO", may proceed with cephalosporin use.     Outpatient Encounter Medications as of 11/03/2020  Medication Sig   albuterol (VENTOLIN HFA) 108 (90 Base) MCG/ACT inhaler Inhale 2 puffs into the lungs every 6 (six) hours as needed for wheezing or shortness of breath.   amLODipine (NORVASC) 10 MG tablet Take 1 tablet (10 mg total) by mouth daily. (NEEDS TO BE SEEN BEFORE NEXT REFILL)    apixaban (ELIQUIS) 5 MG TABS tablet Take 1 tablet (5 mg total) by mouth 2 (two) times daily.   atorvastatin (LIPITOR) 40 MG tablet Take 1 tablet (40 mg total) by mouth daily.   baclofen (LIORESAL) 10 MG tablet TAKE 1 TABLET BY MOUTH THREE TIMES A DAY AS NEEDED FOR MUSCLE SPASMS   blood glucose meter kit and supplies Dispense based on patient and insurance preference. Use up to four times daily as directed. (FOR ICD-10 E10.9, E11.9).   carvedilol (COREG) 3.125 MG tablet Take 1 tablet (3.125 mg total) by mouth 2 (two) times daily with a meal.   DULoxetine (CYMBALTA) 60 MG capsule Take 1 capsule (60 mg total) by mouth daily. (NEEDS TO BE SEEN BEFORE NEXT REFILL)   fluticasone (FLONASE) 50 MCG/ACT nasal spray Place 2 sprays into both nostrils daily.   Fluticasone-Umeclidin-Vilant (TRELEGY ELLIPTA) 100-62.5-25 MCG/INH AEPB INHALE 1 PUFF INTO THE LUNGS EVERY DAY   gabapentin (NEURONTIN) 800 MG tablet Take 1 tablet (800 mg total) by mouth in the morning, at noon, in the evening, and at bedtime. (NEEDS TO BE SEEN BEFORE NEXT REFILL)   glucose blood test strip Test BS daily Dx E11.65   lisinopril (ZESTRIL) 5 MG tablet Take 1 tablet (5 mg total) by mouth daily.   loratadine (CLARITIN) 10 MG tablet Take 1 tablet (10 mg total) by mouth daily.   magic mouthwash w/lidocaine SOLN Take 10 mLs by mouth 3 (three) times daily as needed for mouth pain.   metFORMIN (GLUCOPHAGE) 500 MG tablet Take 1 tablet (500 mg total) by mouth daily with breakfast.  OXcarbazepine (TRILEPTAL) 150 MG tablet Take 1 tablet (150 mg total) by mouth 2 (two) times daily.   Vitamin D, Ergocalciferol, (DRISDOL) 1.25 MG (50000 UNIT) CAPS capsule Take 1 capsule (50,000 Units total) by mouth every 7 (seven) days.   No facility-administered encounter medications on file as of 11/03/2020.    Patient Active Problem List   Diagnosis Date Noted   Type 2 diabetes mellitus with diabetic polyneuropathy, without long-term current use of insulin ([place])  05/27/2019   Perirectal abscess 08/28/2018   PAF (paroxysmal atrial fibrillation) ([place]) 08/28/2018   Unable to read or write 05/31/2018   Canker sores oral    Bradycardia    Hypomagnesemia    Hypokalemia 03/20/2018   Atrial fibrillation with RVR ([place]) 03/20/2018   Hyponatremia 03/20/2018   DDD (degenerative disc disease), lumbar 11/13/2017   Femoroacetabular impingement of both hips 11/13/2017   Late effect of cerebrovascular accident (CVA) 10/25/2017   H/O: CVA (cerebrovascular accident) 10/22/2017   Tobacco use 10/22/2017   Depression 12/25/2016   Abnormal drug screen 11/02/2016   Pain management contract broken 03/09/2016   Coronary artery calcification 01/18/2016   Thoracic aortic atherosclerosis ([place]) 01/18/2016   Chronic back pain 08/03/2015   Obesity (BMI 30-39.9) 03/09/2015   Erectile dysfunction 08/24/2014   Vitamin D deficiency 04/18/2014   Hyperlipidemia 04/18/2014   COPD (chronic obstructive pulmonary disease) ([place]) 04/17/2014   Chronic pain syndrome 08/19/2012   Adjustment disorder with mixed anxiety and depressed mood 05/11/2012   Trigeminal neuralgia 05/11/2012   HTN (hypertension) 05/11/2012    Conditions to be addressed/monitored:HTN, DMII, and chronic pain due to trigeminal neuralgia  Care Plan : [facility] Care Plan  Updates made by Ilean China, RN since 11/03/2020 12:00 AM     Problem: Chronic Disease Management Needs   Priority: Medium     Long-Range Goal: Work with [facility] Regarding Care Coordination and Care Management Associated with HTN, DM, latent effects of CVA, and Trigeminal Neuralgia   Start Date: 10/07/2020  This Visit's Progress: On track  Recent Progress: On track  Priority: Medium  Note:   Current Barriers:  Chronic Disease Management support and education needs related to HTN, DMII, and chronic pain due to trigeminal neuralgia  RNCM Clinical Goal(s):  Patient will take all medications exactly as prescribed and will call provider for  medication related questions continue to work with RN Care Manager to address care management and care coordination needs related to HTN, DMII, and trigeminal neuarlgia  through collaboration with RN Care manager, provider, and care team.  Patient will keep appointment for MRI on 11/08/20 and will f/u with neurologist regarding results and treatment plan for trigeminal neuralgia  Interventions: 1:1 collaboration with primary care provider regarding development and update of comprehensive plan of care as evidenced by provider attestation and co-signature Inter-disciplinary care team collaboration (see longitudinal plan of care) Evaluation of current treatment plan related to  self management and patient's adherence to plan as established by provider Provided with [facility] telephone number and mailed business card to home address. Encouraged patient to reach out as needed.    Diabetes:  (Status: Goal on track: YES.) Lab Results  Component Value Date   HGBA1C 5.7 09/28/2020  Assessed patient's understanding of A1c goal: <7% Provided education to patient about basic DM disease process; Reviewed medications with patient and discussed importance of medication adherence;        Reviewed prescribed diet with patient ADA/Carb Modified ; Counseled on importance of regular laboratory monitoring as prescribed;  Discussed plans with patient for ongoing care management follow up and provided patient with direct contact information for care management team;      Advised patient, providing education and rationale, to check cbg daily and PRN and record        Review of patient status, including review of consultants reports, relevant laboratory and other test results, and medications completed;       Assessed social determinant of health barriers;         Hypertension: (Status: Goal on track: YES.) Last practice recorded BP readings:  BP Readings from Last 3 Encounters:  10/15/20 135/75  09/28/20 133/79   06/21/20 139/79  Most recent eGFR/CrCl:  Lab Results  Component Value Date   EGFR 107 09/28/2020    No components found for: CRCL  Evaluation of current treatment plan related to hypertension self management and patient's adherence to plan as established by provider;   Reviewed prescribed diet low sodium/DASH diet Reviewed medications with patient and discussed importance of compliance;  Counseled on the importance of exercise goals with target of 150 minutes per week Discussed plans with patient for ongoing care management follow up and provided patient with direct contact information for care management team; Advised patient, providing education and rationale, to monitor blood pressure daily and record, calling PCP for findings outside established parameters;  Discussed complications of poorly controlled blood pressure such as heart disease, stroke, circulatory complications, vision complications, kidney impairment, sexual dysfunction;   Pain:  (Status: Goal on track: YES.) Pain assessment performed Medications reviewed and discussed with patient. Sometimes falls asleep before taking bedtime dose of gabapentin. Discussed strategies to help get in all 4 doses of medication and encouraged to not skip doses because pain is harder to get back under control Reviewed provider established plan for pain management; Discussed importance of adherence to all scheduled medical appointments; Counseled on the importance of reporting any/all new or changed pain symptoms or management strategies to pain management provider; Advised patient to report to care team affect of pain on daily activities; Discussed use of relaxation techniques and/or diversional activities to assist with pain reduction (distraction, imagery, relaxation, massage, acupressure, TENS, heat, and cold application; Reviewed with patient prescribed pharmacological and nonpharmacological pain relief strategies; Discussed typical diet and  influence of pain on eating habits. Pain does affect his ability to eat and chew but he does eat regularly. Discussed use of Magic Mouth wash and that he was able to pick up the new prescription from the pharmacy. Encouraged patient to supplement with sugar free Boost, Ensure, Glucerna, if unable to eat meals regularly due to pain Encouraged patient to keep MRI appt for 11/08/20. Discussed concerns regarding being in a closed in space in the MRI. Patient discussed concerns with neurologist at last visit and believes he'll be able to tolerate it.  Evaluated need for transportation assistance to appointment. Per patient, his son will be able to take him.  Therapeutic listening utilized regarding pain and how it affects his daily life Discussed that medications do not manage pain very well Encouraged patient talk with LCSW regarding psychosocial concerns Discussed family/social support  Patient Goals/Self-Care Activities: Patient will self administer medications as prescribed Patient will attend all scheduled provider appointments Patient will call pharmacy for medication refills Patient will continue to perform ADL's independently Patient will continue to perform IADL's independently Patient will call provider office for new concerns or questions         Plan:Telephone follow up appointment with care management  team member scheduled for:  11/19/20 with RNCM and The patient has been provided with contact information for the care management team and has been advised to call with any health related questions or concerns.   Chong Sicilian, BSN, RN-BC Embedded Chronic Care Manager [facility] / [place] Management Direct Dial: [phone]

## 2020-11-08 ENCOUNTER — Other Ambulatory Visit: Payer: Self-pay

## 2020-11-08 ENCOUNTER — Ambulatory Visit
Admission: RE | Admit: 2020-11-08 | Discharge: 2020-11-08 | Disposition: A | Discharge status: Home / Self Care | Payer: Medicare Other | Source: Ambulatory Visit | Attending: Neurology | Admitting: Neurology

## 2020-11-08 DIAGNOSIS — G500 Trigeminal neuralgia: Secondary | ICD-10-CM | POA: Diagnosis not present

## 2020-11-08 MED ORDER — GADOBENATE DIMEGLUMINE 529 MG/ML IV SOLN
19.0000 mL | Freq: Once | INTRAVENOUS | Status: AC | PRN
  Administered 2020-11-08: 19 mL via INTRAVENOUS

## 2020-11-12 DIAGNOSIS — E1142 Type 2 diabetes mellitus with diabetic polyneuropathy: Secondary | ICD-10-CM | POA: Diagnosis not present

## 2020-11-12 DIAGNOSIS — I10 Essential (primary) hypertension: Secondary | ICD-10-CM

## 2020-11-15 ENCOUNTER — Other Ambulatory Visit: Payer: Self-pay | Admitting: Family

## 2020-11-15 DIAGNOSIS — G500 Trigeminal neuralgia: Secondary | ICD-10-CM

## 2020-11-16 ENCOUNTER — Telehealth: Payer: Self-pay | Admitting: Neurology

## 2020-11-16 ENCOUNTER — Ambulatory Visit (INDEPENDENT_AMBULATORY_CARE_PROVIDER_SITE_OTHER): Payer: Medicare Other | Admitting: Licensed Clinical Social Worker

## 2020-11-16 DIAGNOSIS — G500 Trigeminal neuralgia: Secondary | ICD-10-CM

## 2020-11-16 DIAGNOSIS — I10 Essential (primary) hypertension: Secondary | ICD-10-CM

## 2020-11-16 DIAGNOSIS — M5136 Other intervertebral disc degeneration, lumbar region: Secondary | ICD-10-CM

## 2020-11-16 DIAGNOSIS — R591 Generalized enlarged lymph nodes: Secondary | ICD-10-CM

## 2020-11-16 DIAGNOSIS — J441 Chronic obstructive pulmonary disease with (acute) exacerbation: Secondary | ICD-10-CM

## 2020-11-16 DIAGNOSIS — J438 Other emphysema: Secondary | ICD-10-CM

## 2020-11-16 DIAGNOSIS — F4323 Adjustment disorder with mixed anxiety and depressed mood: Secondary | ICD-10-CM

## 2020-11-16 DIAGNOSIS — E559 Vitamin D deficiency, unspecified: Secondary | ICD-10-CM

## 2020-11-16 DIAGNOSIS — Z8673 Personal history of transient ischemic attack (TIA), and cerebral infarction without residual deficits: Secondary | ICD-10-CM

## 2020-11-16 DIAGNOSIS — E1142 Type 2 diabetes mellitus with diabetic polyneuropathy: Secondary | ICD-10-CM

## 2020-11-16 DIAGNOSIS — I4891 Unspecified atrial fibrillation: Secondary | ICD-10-CM

## 2020-11-16 NOTE — Chronic Care Management (AMB) (Signed)
Chronic Care Management    Clinical Social Work Note  11/16/2020 Name: Matthew [name] MRN: [id number] DOB: Dec 22, 1960  Matthew [name] is a 60 y.o. year old male who is a primary care patient of [name], FNP. The CCM team was consulted to assist the patient with chronic disease management and/or care coordination needs related to: [employer] .   Engaged with patient/son of patient, Matthew [name], by telephone for follow up visit in response to provider referral for social work chronic care management and care coordination services.   Consent to Services:  The patient was given information about Chronic Care Management services, agreed to services, and gave verbal consent prior to initiation of services.  Please see initial visit note for detailed documentation.   Patient agreed to services and consent obtained.   Assessment: Review of patient past medical history, allergies, medications, and health status, including review of relevant consultants reports was performed today as part of a comprehensive evaluation and provision of chronic care management and care coordination services.     SDOH (Social Determinants of Health) assessments and interventions performed:  SDOH Interventions    Flowsheet Row Most Recent Value  SDOH Interventions   Stress Interventions Provide Counseling  [client has stress issues related to managing medical conditions. client has ongoing pain issues]  Depression Interventions/Treatment  --  [informed [name], son of client, of LCSW support for client and of RNCM support for client]        Advanced Directives Status: See Vynca application for related entries.  CCM Care Plan  Allergies  Allergen Reactions   Penicillins Other (See Comments)    Did it involve swelling of the face/tongue/throat, SOB, or low BP? Unknown Did it involve sudden or severe rash/hives, skin peeling, or any reaction on the inside of your mouth or nose? Unknown Did you  need to seek medical attention at a hospital or doctor's office? Unknown When did it last happen? childhood  If all above answers are "NO", may proceed with cephalosporin use.     Outpatient Encounter Medications as of 11/16/2020  Medication Sig   albuterol (VENTOLIN HFA) 108 (90 Base) MCG/ACT inhaler Inhale 2 puffs into the lungs every 6 (six) hours as needed for wheezing or shortness of breath.   amLODipine (NORVASC) 10 MG tablet Take 1 tablet (10 mg total) by mouth daily. (NEEDS TO BE SEEN BEFORE NEXT REFILL)   apixaban (ELIQUIS) 5 MG TABS tablet Take 1 tablet (5 mg total) by mouth 2 (two) times daily.   atorvastatin (LIPITOR) 40 MG tablet Take 1 tablet (40 mg total) by mouth daily.   baclofen (LIORESAL) 10 MG tablet TAKE 1 TABLET BY MOUTH THREE TIMES A DAY AS NEEDED FOR MUSCLE SPASMS   blood glucose meter kit and supplies Dispense based on patient and insurance preference. Use up to four times daily as directed. (FOR ICD-10 E10.9, E11.9).   carvedilol (COREG) 3.125 MG tablet Take 1 tablet (3.125 mg total) by mouth 2 (two) times daily with a meal.   DULoxetine (CYMBALTA) 60 MG capsule Take 1 capsule (60 mg total) by mouth daily. (NEEDS TO BE SEEN BEFORE NEXT REFILL)   fluticasone (FLONASE) 50 MCG/ACT nasal spray Place 2 sprays into both nostrils daily.   Fluticasone-Umeclidin-Vilant (TRELEGY ELLIPTA) 100-62.5-25 MCG/INH AEPB INHALE 1 PUFF INTO THE LUNGS EVERY DAY   gabapentin (NEURONTIN) 800 MG tablet Take 1 tablet (800 mg total) by mouth in the morning, at noon, in the evening, and at bedtime. (  NEEDS TO BE SEEN BEFORE NEXT REFILL)   glucose blood test strip Test BS daily Dx E11.65   lisinopril (ZESTRIL) 5 MG tablet Take 1 tablet (5 mg total) by mouth daily.   loratadine (CLARITIN) 10 MG tablet Take 1 tablet (10 mg total) by mouth daily.   magic mouthwash w/lidocaine SOLN Take 10 mLs by mouth 3 (three) times daily as needed for mouth pain.   metFORMIN (GLUCOPHAGE) 500 MG tablet Take 1 tablet  (500 mg total) by mouth daily with breakfast.   OXcarbazepine (TRILEPTAL) 150 MG tablet Take 1 tablet (150 mg total) by mouth 2 (two) times daily.   Vitamin D, Ergocalciferol, (DRISDOL) 1.25 MG (50000 UNIT) CAPS capsule Take 1 capsule (50,000 Units total) by mouth every 7 (seven) days.   No facility-administered encounter medications on file as of 11/16/2020.    Patient Active Problem List   Diagnosis Date Noted   Type 2 diabetes mellitus with diabetic polyneuropathy, without long-term current use of insulin ([name]) 05/27/2019   Perirectal abscess 08/28/2018   PAF (paroxysmal atrial fibrillation) ([name]) 08/28/2018   Unable to read or write 05/31/2018   Canker sores oral    Bradycardia    Hypomagnesemia    Hypokalemia 03/20/2018   Atrial fibrillation with RVR ([name]) 03/20/2018   Hyponatremia 03/20/2018   DDD (degenerative disc disease), lumbar 11/13/2017   Femoroacetabular impingement of both hips 11/13/2017   Late effect of cerebrovascular accident (CVA) 10/25/2017   H/O: CVA (cerebrovascular accident) 10/22/2017   Tobacco use 10/22/2017   Depression 12/25/2016   Abnormal drug screen 11/02/2016   Pain management contract broken 03/09/2016   Coronary artery calcification 01/18/2016   Thoracic aortic atherosclerosis ([name]) 01/18/2016   Chronic back pain 08/03/2015   Obesity (BMI 30-39.9) 03/09/2015   Erectile dysfunction 08/24/2014   Vitamin D deficiency 04/18/2014   Hyperlipidemia 04/18/2014   COPD (chronic obstructive pulmonary disease) ([name]) 04/17/2014   Chronic pain syndrome 08/19/2012   Adjustment disorder with mixed anxiety and depressed mood 05/11/2012   Trigeminal neuralgia 05/11/2012   HTN (hypertension) 05/11/2012    Conditions to be addressed/monitored: monitor client management of pain issues faced. Monitor client completion of ADLs and other daily activities  Care Plan : LCSW Care Plan  Updates made by [name], LCSW since 11/16/2020 12:00 AM     Problem:  Coping Skills (General Plan of Care)      Goal: Coping Skills Enhanced; Manage Pain issues   Start Date: 10/06/2020  Expected End Date: 01/05/2021  This Visit's Progress: On track  Recent Progress: On track  Priority: Medium  Note:   Current barriers:   Patient in need of assistance with connecting to community resources for possible help with daily activities completion Patient is unable to independently navigate community resource options without care coordination support Pain issues daily Some transport challenges  Clinical Goals:  Client to call LCSW as needed in next 30 days to discuss pain issues of client and his management of pain issues faced Client to call RNCM as needed in next 30 days for nursing support Client to attend all scheduled medical appointments in next 30 days  Clinical Interventions:  Collaboration with [name], FNP regarding development and update of comprehensive plan of care as evidenced by provider attestation and co-signature Assessment of needs, barriers  of client Discussed with [name], son of client, the pain issues faced by client Reviewed with [name] the medication procurement of client Discussed with [name] client sleeping issues. [name] said  that sometimes client has pain issues that keep client from sleeping.  Reviewed with [name] client food intake and appetite of client. [name] said client has pain when chewing and swallowing. [name] said client usually eats a dinner meal at night and that client may have some snacks during the day.  Reviewed with [name] the transport needs of client .   Discussed with [name] recent client MRI.  [name] said client had MRI recently and that they are now waiting on results of MRI.  Reminded [name] of CCM support for client with RNCM and with LCSW.  Reviewed with [name] client walking ability.  Client is walking well currently Discussed with [name] the mood of client. [name] said client was in a stable  mood; however, [name] spoke of pain issues of client and said  that sometimes these pain issues can be difficult on client  and on client's mood  Patient Coping Skills: Completes ADLs daily Takes medications as prescribed Has support from son, Bristol Soy  Patient Deficits: Pain issues Some communication challenges (history of CVA)  Patient Goals:   Client to call RNCM or LCSW as needed for CCM support in next 30 days In next 30 days, client to attend scheduled medical appointments Client to practice self care, to allow time for rest and relaxation in next 30 days    Follow Up Plan: LCSW to call client or Jessy Cybulski on 01/04/21 at 3:30 PM to assess client needs      Norva Riffle.Ariya Bohannon MSW, LCSW Licensed Clinical Social Worker [facility] Care Management [phone]

## 2020-11-16 NOTE — Telephone Encounter (Signed)
Mri results not read yet.

## 2020-11-16 NOTE — Telephone Encounter (Signed)
Dr.Patel, can you review MRI results from 11/08/2020, thank you and route to pool.

## 2020-11-16 NOTE — Patient Instructions (Signed)
Visit Information  PATIENT GOALS:  Goals Addressed             This Visit's Progress    Manage Emotions; Manage Pain issues faced daily       Timeframe:  Short-Term Goal Priority:  Medium Progress: On track Start Date: 10/06/20                             Expected End Date 01/05/21:                       Follow Up Date 01/04/21 at 3:30 PM    Manage Emotions; Manage Pain issues faced daily    Why is this important?   When you are stressed, down or upset, your body reacts too.  For example, your blood pressure may get higher; you may have a headache or stomachache.  When your emotions get the best of you, your body's ability to fight off cold and flu gets weak.  These steps will help you manage your emotions.    Patient Coping Skills: Completes ADLs daily Takes medications as prescribed Has support from son, Cray Monnin  Patient Deficits: Pain issues Some communication challenges (history of CVA)  Patient Goals:   Client to call RNCM or LCSW as needed for CCM support in next 30 days In next 30 days, client to attend scheduled medical appointments Client to practice self care, to allow time for rest and relaxation in next 30 days -  Follow Up Plan: LCSW to call client or Tirth Cothron on 01/04/21 at 3:30 PM to assess client needs     Norva Riffle.Ingvald Theisen MSW, LCSW Licensed Clinical Social Worker [facility] Care Management [phone]

## 2020-11-16 NOTE — Telephone Encounter (Signed)
Pt son called, said he was waiting for a call back from patel for his dads results.

## 2020-11-16 NOTE — Telephone Encounter (Signed)
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 Due to refractory pain, I will refer him to [facility] neurosurgery for management options.    Imaging also shows right submandibular enlarged lymph node.  I will share this with his PCP, and request they look into this further with dedicated imaging, if needed.  Collins Kerby K. Posey Pronto, DO

## 2020-11-16 NOTE — Addendum Note (Signed)
Addended by: Evelina Dun A on: 11/16/2020 03:25 PM   Modules accepted: Orders

## 2020-11-16 NOTE — Telephone Encounter (Signed)
Referral created and faxed to Dr. Angelene Giovanni.

## 2020-11-16 NOTE — Telephone Encounter (Signed)
CT neck ordered. Accidental  finding of enlarged lymph node.

## 2020-11-16 NOTE — Addendum Note (Signed)
Addended by: Armen Pickup A on: 11/16/2020 03:08 PM   Modules accepted: Orders

## 2020-11-17 NOTE — Patient Instructions (Signed)
Visit Information  PATIENT GOALS:  Goals Addressed             This Visit's Progress    Reach out to CCM Team as Needed for Transportation Assistance   On track    Timeframe:  Long-Range Goal Priority:  Medium Start Date:                             Expected End Date:                       Follow up: 11/03/20  Call RN Care Manager [phone] or Theadore Nan, [place] [phone] with any transportation assistance needs         Patient verbalizes understanding of instructions provided today and agrees to view in [place].   Plan:Telephone follow up appointment with care management team member scheduled for:  11/03/20 and The patient has been provided with contact information for the care management team and has been advised to call with any health related questions or concerns.   Chong Sicilian, BSN, RN-BC Embedded Chronic Care Manager [facility] / [place] Management Direct Dial: [phone]

## 2020-11-17 NOTE — Chronic Care Management (AMB) (Signed)
Chronic Care Management   CCM RN Visit Note  10/22/2020 Name: Matthew [name] MRN: [id number] DOB: 08/28/1960  Subjective: OVERTON [name] is a 60 y.o. year old male who is a primary care patient of [name], FNP. The care management team was consulted for assistance with disease management and care coordination needs.    Engaged with patient by telephone for follow up visit in response to provider referral for case management and/or care coordination services.   Consent to Services:  The patient was given information about Chronic Care Management services, agreed to services, and gave verbal consent prior to initiation of services.  Please see initial visit note for detailed documentation.   Patient agreed to services and verbal consent obtained.   Assessment: Review of patient past medical history, allergies, medications, health status, including review of consultants reports, laboratory and other test data, was performed as part of comprehensive evaluation and provision of chronic care management services.   SDOH (Social Determinants of Health) assessments and interventions performed:    CCM Care Plan  Allergies  Allergen Reactions   Penicillins Other (See Comments)    Did it involve swelling of the face/tongue/throat, SOB, or low BP? Unknown Did it involve sudden or severe rash/hives, skin peeling, or any reaction on the inside of your mouth or nose? Unknown Did you need to seek medical attention at a hospital or doctor's office? Unknown When did it last happen? childhood  If all above answers are "NO", may proceed with cephalosporin use.     Outpatient Encounter Medications as of 10/22/2020  Medication Sig   albuterol (VENTOLIN HFA) 108 (90 Base) MCG/ACT inhaler Inhale 2 puffs into the lungs every 6 (six) hours as needed for wheezing or shortness of breath.   amLODipine (NORVASC) 10 MG tablet Take 1 tablet (10 mg total) by mouth daily. (NEEDS TO BE SEEN BEFORE NEXT REFILL)    apixaban (ELIQUIS) 5 MG TABS tablet Take 1 tablet (5 mg total) by mouth 2 (two) times daily.   atorvastatin (LIPITOR) 40 MG tablet Take 1 tablet (40 mg total) by mouth daily.   blood glucose meter kit and supplies Dispense based on patient and insurance preference. Use up to four times daily as directed. (FOR ICD-10 E10.9, E11.9).   carvedilol (COREG) 3.125 MG tablet Take 1 tablet (3.125 mg total) by mouth 2 (two) times daily with a meal.   DULoxetine (CYMBALTA) 60 MG capsule Take 1 capsule (60 mg total) by mouth daily. (NEEDS TO BE SEEN BEFORE NEXT REFILL)   fluticasone (FLONASE) 50 MCG/ACT nasal spray Place 2 sprays into both nostrils daily.   Fluticasone-Umeclidin-Vilant (TRELEGY ELLIPTA) 100-62.5-25 MCG/INH AEPB INHALE 1 PUFF INTO THE LUNGS EVERY DAY   gabapentin (NEURONTIN) 800 MG tablet Take 1 tablet (800 mg total) by mouth in the morning, at noon, in the evening, and at bedtime. (NEEDS TO BE SEEN BEFORE NEXT REFILL)   glucose blood test strip Test BS daily Dx E11.65   lisinopril (ZESTRIL) 5 MG tablet Take 1 tablet (5 mg total) by mouth daily.   loratadine (CLARITIN) 10 MG tablet Take 1 tablet (10 mg total) by mouth daily.   metFORMIN (GLUCOPHAGE) 500 MG tablet Take 1 tablet (500 mg total) by mouth daily with breakfast.   OXcarbazepine (TRILEPTAL) 150 MG tablet Take 1 tablet (150 mg total) by mouth 2 (two) times daily.   Vitamin D, Ergocalciferol, (DRISDOL) 1.25 MG (50000 UNIT) CAPS capsule Take 1 capsule (50,000 Units total) by mouth every 7 (  seven) days.   [DISCONTINUED] baclofen (LIORESAL) 10 MG tablet TAKE 1 TABLET BY MOUTH THREE TIMES A DAY AS NEEDED FOR MUSCLE SPASMS   [DISCONTINUED] magic mouthwash w/lidocaine SOLN Take 10 mLs by mouth 3 (three) times daily as needed for mouth pain.   No facility-administered encounter medications on file as of 10/22/2020.    Patient Active Problem List   Diagnosis Date Noted   Type 2 diabetes mellitus with diabetic polyneuropathy, without long-term  current use of insulin ([place]) 05/27/2019   Perirectal abscess 08/28/2018   PAF (paroxysmal atrial fibrillation) ([place]) 08/28/2018   Unable to read or write 05/31/2018   Canker sores oral    Bradycardia    Hypomagnesemia    Hypokalemia 03/20/2018   Atrial fibrillation with RVR ([place]) 03/20/2018   Hyponatremia 03/20/2018   DDD (degenerative disc disease), lumbar 11/13/2017   Femoroacetabular impingement of both hips 11/13/2017   Late effect of cerebrovascular accident (CVA) 10/25/2017   H/O: CVA (cerebrovascular accident) 10/22/2017   Tobacco use 10/22/2017   Depression 12/25/2016   Abnormal drug screen 11/02/2016   Pain management contract broken 03/09/2016   Coronary artery calcification 01/18/2016   Thoracic aortic atherosclerosis ([place]) 01/18/2016   Chronic back pain 08/03/2015   Obesity (BMI 30-39.9) 03/09/2015   Erectile dysfunction 08/24/2014   Vitamin D deficiency 04/18/2014   Hyperlipidemia 04/18/2014   COPD (chronic obstructive pulmonary disease) ([place]) 04/17/2014   Chronic pain syndrome 08/19/2012   Adjustment disorder with mixed anxiety and depressed mood 05/11/2012   Trigeminal neuralgia 05/11/2012   HTN (hypertension) 05/11/2012    Conditions to be addressed/monitored:HTN, DMII, and trigeminal neuralgia  Care Plan : RNCM Care Plan   Problem: Chronic Disease Management Needs   Priority: Medium     Long-Range Goal: Work with [facility] Regarding Care Coordination and Care Management Associated with HTN, DM, latent effects of CVA, and Trigeminal Neuralgia   Start Date: 10/07/2020  This Visit's Progress: On track  Recent Progress: On track  Priority: Medium  Note:   Current Barriers:  Chronic Disease Management support and education needs related to HTN, DMII, and chronic pain due to trigeminal neuralgia  RNCM Clinical Goal(s):  Patient will take all medications exactly as prescribed and will call provider for medication related questions continue to work with RN Care  Manager to address care management and care coordination needs related to HTN, DMII, and trigeminal neuarlgia  through collaboration with RN Care manager, provider, and care team.  Patient will keep appointment for MRI on 11/08/20 and will f/u with neurologist regarding results and treatment plan for trigeminal neuralgia  Interventions: 1:1 collaboration with primary care provider regarding development and update of comprehensive plan of care as evidenced by provider attestation and co-signature Inter-disciplinary care team collaboration (see longitudinal plan of care) Evaluation of current treatment plan related to self management and patient's adherence to plan as established by provider Provided with [facility] telephone number and mailed business card to home address. Encouraged patient to reach out as needed.    SDOH Barriers (Status: Goal on track: YES.)  Patient interviewed and SDOH assessment performed Discussed transportation barriers and family/social support Discussed that patient does have a ride to his upcoming appointments. His son is going to assist with this Provided information on transportation assistance and encouraged patient to reach out to CCM team if assistance is needed Collaborated with LCSW    Diabetes:  (Status: Condition stable. Not addressed this visit.) Lab Results  Component Value Date   HGBA1C 5.7 09/28/2020  Assessed patient's understanding of A1c goal: <7% Provided education to patient about basic DM disease process; Reviewed medications with patient and discussed importance of medication adherence;        Reviewed prescribed diet with patient ADA/Carb Modified ; Counseled on importance of regular laboratory monitoring as prescribed;        Discussed plans with patient for ongoing care management follow up and provided patient with direct contact information for care management team;      Advised patient, providing education and rationale, to check cbg daily and  PRN and record        Review of patient status, including review of consultants reports, relevant laboratory and other test results, and medications completed;       Assessed social determinant of health barriers;         Hypertension: (Status: Condition stable. Not addressed this visit.) Last practice recorded BP readings:  BP Readings from Last 3 Encounters:  10/15/20 135/75  09/28/20 133/79  06/21/20 139/79  Most recent eGFR/CrCl:  Lab Results  Component Value Date   EGFR 107 09/28/2020    No components found for: CRCL  Evaluation of current treatment plan related to hypertension self management and patient's adherence to plan as established by provider;   Reviewed prescribed diet low sodium/DASH diet Reviewed medications with patient and discussed importance of compliance;  Counseled on the importance of exercise goals with target of 150 minutes per week Discussed plans with patient for ongoing care management follow up and provided patient with direct contact information for care management team; Advised patient, providing education and rationale, to monitor blood pressure daily and record, calling PCP for findings outside established parameters;  Discussed complications of poorly controlled blood pressure such as heart disease, stroke, circulatory complications, vision complications, kidney impairment, sexual dysfunction;   Pain:  (Status: Condition stable. Not addressed this visit.) Pain assessment performed Medications reviewed and discussed with patient. Sometimes falls asleep before taking bedtime dose of gabapentin. Discussed strategies to help get in all 4 doses of medication and encouraged to not skip doses because pain is harder to get back under control Reviewed provider established plan for pain management; Discussed importance of adherence to all scheduled medical appointments; Counseled on the importance of reporting any/all new or changed pain symptoms or management  strategies to pain management provider; Advised patient to report to care team affect of pain on daily activities; Discussed use of relaxation techniques and/or diversional activities to assist with pain reduction (distraction, imagery, relaxation, massage, acupressure, TENS, heat, and cold application; Reviewed with patient prescribed pharmacological and nonpharmacological pain relief strategies; Discussed typical diet and influence of pain on eating habits. Pain does affect his ability to eat and chew but he does eat regularly. Discussed use of Magic Mouth wash and that he was able to pick up the new prescription from the pharmacy. Encouraged patient to supplement with sugar free Boost, Ensure, Glucerna, if unable to eat meals regularly due to pain Encouraged patient to keep MRI appt for 11/08/20. Discussed concerns regarding being in a closed in space in the MRI. Patient discussed concerns with neurologist at last visit and believes he'll be able to tolerate it.  Evaluated need for transportation assistance to appointment. Per patient, his son will be able to take him.  Therapeutic listening utilized regarding pain and how it affects his daily life Discussed that medications do not manage pain very well Encouraged patient talk with LCSW regarding psychosocial concerns Discussed family/social support  Patient Goals/Self-Care  Activities: Patient will self administer medications as prescribed Patient will attend all scheduled provider appointments Patient will call pharmacy for medication refills Patient will continue to perform ADL's independently Patient will continue to perform IADL's independently Patient will call provider office for new concerns or questions      Plan:Telephone follow up appointment with care management team member scheduled for:  11/03/20 and The patient has been provided with contact information for the care management team and has been advised to call with any health  related questions or concerns.   Chong Sicilian, BSN, RN-BC Embedded Chronic Care Manager [facility] / [place] Management Direct Dial: [phone]

## 2020-11-19 ENCOUNTER — Ambulatory Visit: Payer: Medicare Other | Admitting: *Deleted

## 2020-11-19 DIAGNOSIS — E1142 Type 2 diabetes mellitus with diabetic polyneuropathy: Secondary | ICD-10-CM

## 2020-11-19 DIAGNOSIS — G500 Trigeminal neuralgia: Secondary | ICD-10-CM

## 2020-11-19 DIAGNOSIS — I10 Essential (primary) hypertension: Secondary | ICD-10-CM

## 2020-11-19 NOTE — Patient Instructions (Signed)
Visit Information  PATIENT GOALS:  Goals Addressed             This Visit's Progress    RNCM: [place] My Quality of Life   On track    Timeframe:  Long-Range Goal Priority:  Medium Start Date: 10/07/20                            Expected End Date: 10/07/21                      Follow Up Date 12/21/20    Talk with family/friends daily Get outside once a day Talk with LCSW regarding psychosocial concerns Call RN Care Manager as needed [phone] Keep all medical appointments and imaging appointments Take gabapentin as prescribed. Try to get in that 4th dose before going to bed.   Why is this important?   Having a long-term illness can be scary.  It can also be stressful for you and your caregiver.  These steps may help.    Notes:         Patient verbalizes understanding of instructions provided today and agrees to view in [place].   Plan:Telephone follow up appointment with care management team member scheduled for:  12/21/20 with RNCM and The patient has been provided with contact information for the care management team and has been advised to call with any health related questions or concerns.   Chong Sicilian, BSN, RN-BC Embedded Chronic Care Manager [facility] / [place] Management Direct Dial: [phone]

## 2020-11-19 NOTE — Chronic Care Management (AMB) (Signed)
Chronic Care Management   CCM RN Visit Note  11/19/2020 Name: Matthew [name] MRN: [id number] DOB: 03/27/1960  Subjective: Matthew [name] is a 59 y.o. year old male who is a primary care patient of [name], FNP. The care management team was consulted for assistance with disease management and care coordination needs.    Engaged with patient by telephone for follow up visit in response to provider referral for case management and/or care coordination services.   Consent to Services:  The patient was given information about Chronic Care Management services, agreed to services, and gave verbal consent prior to initiation of services.  Please see initial visit note for detailed documentation.   Patient agreed to services and verbal consent obtained.   Assessment: Review of patient past medical history, allergies, medications, health status, including review of consultants reports, laboratory and other test data, was performed as part of comprehensive evaluation and provision of chronic care management services.   SDOH (Social Determinants of Health) assessments and interventions performed:    CCM Care Plan  Allergies  Allergen Reactions   Penicillins Other (See Comments)    Did it involve swelling of the face/tongue/throat, SOB, or low BP? Unknown Did it involve sudden or severe rash/hives, skin peeling, or any reaction on the inside of your mouth or nose? Unknown Did you need to seek medical attention at a hospital or doctor's office? Unknown When did it last happen? childhood  If all above answers are "NO", may proceed with cephalosporin use.     Outpatient Encounter Medications as of 11/19/2020  Medication Sig   albuterol (VENTOLIN HFA) 108 (90 Base) MCG/ACT inhaler Inhale 2 puffs into the lungs every 6 (six) hours as needed for wheezing or shortness of breath.   amLODipine (NORVASC) 10 MG tablet Take 1 tablet (10 mg total) by mouth daily. (NEEDS TO BE SEEN BEFORE NEXT REFILL)    apixaban (ELIQUIS) 5 MG TABS tablet Take 1 tablet (5 mg total) by mouth 2 (two) times daily.   atorvastatin (LIPITOR) 40 MG tablet Take 1 tablet (40 mg total) by mouth daily.   baclofen (LIORESAL) 10 MG tablet TAKE 1 TABLET BY MOUTH THREE TIMES A DAY AS NEEDED FOR MUSCLE SPASMS   blood glucose meter kit and supplies Dispense based on patient and insurance preference. Use up to four times daily as directed. (FOR ICD-10 E10.9, E11.9).   carvedilol (COREG) 3.125 MG tablet Take 1 tablet (3.125 mg total) by mouth 2 (two) times daily with a meal.   DULoxetine (CYMBALTA) 60 MG capsule Take 1 capsule (60 mg total) by mouth daily. (NEEDS TO BE SEEN BEFORE NEXT REFILL)   fluticasone (FLONASE) 50 MCG/ACT nasal spray Place 2 sprays into both nostrils daily.   Fluticasone-Umeclidin-Vilant (TRELEGY ELLIPTA) 100-62.5-25 MCG/INH AEPB INHALE 1 PUFF INTO THE LUNGS EVERY DAY   gabapentin (NEURONTIN) 800 MG tablet Take 1 tablet (800 mg total) by mouth in the morning, at noon, in the evening, and at bedtime. (NEEDS TO BE SEEN BEFORE NEXT REFILL)   glucose blood test strip Test BS daily Dx E11.65   lisinopril (ZESTRIL) 5 MG tablet Take 1 tablet (5 mg total) by mouth daily.   loratadine (CLARITIN) 10 MG tablet Take 1 tablet (10 mg total) by mouth daily.   magic mouthwash w/lidocaine SOLN Take 10 mLs by mouth 3 (three) times daily as needed for mouth pain.   metFORMIN (GLUCOPHAGE) 500 MG tablet Take 1 tablet (500 mg total) by mouth daily with breakfast.  OXcarbazepine (TRILEPTAL) 150 MG tablet Take 1 tablet (150 mg total) by mouth 2 (two) times daily.   Vitamin D, Ergocalciferol, (DRISDOL) 1.25 MG (50000 UNIT) CAPS capsule Take 1 capsule (50,000 Units total) by mouth every 7 (seven) days.   No facility-administered encounter medications on file as of 11/19/2020.    Patient Active Problem List   Diagnosis Date Noted   Type 2 diabetes mellitus with diabetic polyneuropathy, without long-term current use of insulin ([place])  05/27/2019   Perirectal abscess 08/28/2018   PAF (paroxysmal atrial fibrillation) ([place]) 08/28/2018   Unable to read or write 05/31/2018   Canker sores oral    Bradycardia    Hypomagnesemia    Hypokalemia 03/20/2018   Atrial fibrillation with RVR ([place]) 03/20/2018   Hyponatremia 03/20/2018   DDD (degenerative disc disease), lumbar 11/13/2017   Femoroacetabular impingement of both hips 11/13/2017   Late effect of cerebrovascular accident (CVA) 10/25/2017   H/O: CVA (cerebrovascular accident) 10/22/2017   Tobacco use 10/22/2017   Depression 12/25/2016   Abnormal drug screen 11/02/2016   Pain management contract broken 03/09/2016   Coronary artery calcification 01/18/2016   Thoracic aortic atherosclerosis ([place]) 01/18/2016   Chronic back pain 08/03/2015   Obesity (BMI 30-39.9) 03/09/2015   Erectile dysfunction 08/24/2014   Vitamin D deficiency 04/18/2014   Hyperlipidemia 04/18/2014   COPD (chronic obstructive pulmonary disease) ([place]) 04/17/2014   Chronic pain syndrome 08/19/2012   Adjustment disorder with mixed anxiety and depressed mood 05/11/2012   Trigeminal neuralgia 05/11/2012   HTN (hypertension) 05/11/2012    Conditions to be addressed/monitored:HTN, DMII, and trigeminal neuralgia  Care Plan : [facility] Care Plan  Updates made by Ilean China, RN since 11/19/2020 12:00 AM     Problem: Chronic Disease Management Needs   Priority: Medium     Long-Range Goal: Work with [facility] Regarding Care Coordination and Care Management Associated with HTN, DM, latent effects of CVA, and Trigeminal Neuralgia   Start Date: 10/07/2020  Recent Progress: On track  Priority: Medium  Note:   Current Barriers:  Chronic Disease Management support and education needs related to HTN, DMII, and chronic pain due to trigeminal neuralgia  RNCM Clinical Goal(s):  Patient will take all medications exactly as prescribed and will call provider for medication related questions continue to work with RN  Care Manager to address care management and care coordination needs related to HTN, DMII, and trigeminal neuarlgia  through collaboration with RN Care manager, provider, and care team.  Patient will attend all scheduled medical appointments and procedure appointments  Interventions: 1:1 collaboration with primary care provider regarding development and update of comprehensive plan of care as evidenced by provider attestation and co-signature Inter-disciplinary care team collaboration (see longitudinal plan of care) Evaluation of current treatment plan related to self management and patient's adherence to plan as established by provider Provided with [facility] telephone number and mailed business card to home address. Encouraged patient to reach out as needed.    SDOH Barriers (Status: Goal on track: YES.)  Patient interviewed and SDOH assessment performed Discussed transportation barriers and family/social support Discussed that patient does have a ride to his upcoming appointments. His son is going to assist with this Provided information on transportation assistance and encouraged patient to reach out to CCM team if assistance is needed Collaborated with LCSW    Diabetes:  (Status: Condition stable. Not addressed this visit.) Lab Results  Component Value Date   HGBA1C 5.7 09/28/2020  Assessed patient's understanding of A1c goal: <7%  Provided education to patient about basic DM disease process; Reviewed medications with patient and discussed importance of medication adherence;        Reviewed prescribed diet with patient ADA/Carb Modified ; Counseled on importance of regular laboratory monitoring as prescribed;        Discussed plans with patient for ongoing care management follow up and provided patient with direct contact information for care management team;      Advised patient, providing education and rationale, to check cbg daily and PRN and record        Review of patient status,  including review of consultants reports, relevant laboratory and other test results, and medications completed;       Assessed social determinant of health barriers;         Hypertension: (Status: Condition stable. Not addressed this visit.) Last practice recorded BP readings:  BP Readings from Last 3 Encounters:  10/15/20 135/75  09/28/20 133/79  06/21/20 139/79  Most recent eGFR/CrCl:  Lab Results  Component Value Date   EGFR 107 09/28/2020    No components found for: CRCL  Evaluation of current treatment plan related to hypertension self management and patient's adherence to plan as established by provider;   Reviewed prescribed diet low sodium/DASH diet Reviewed medications with patient and discussed importance of compliance;  Counseled on the importance of exercise goals with target of 150 minutes per week Discussed plans with patient for ongoing care management follow up and provided patient with direct contact information for care management team; Advised patient, providing education and rationale, to monitor blood pressure daily and record, calling PCP for findings outside established parameters;  Discussed complications of poorly controlled blood pressure such as heart disease, stroke, circulatory complications, vision complications, kidney impairment, sexual dysfunction;    Pain:  (Status: Goal on track: YES.) Pain assessment performed Medications reviewed and discussed with patient. Discussed importance of adherence to all scheduled medical appointments; Counseled on the importance of reporting any/all new or changed pain symptoms or management strategies to pain management provider; Advised patient to report to care team affect of pain on daily activities; Reviewed and discussed MRI results and neurologists interpretation and recommendation for f/u with neurosurgeon and additional imaging Reviewed referral to Neurosurgeon Dr Salomon Fick in [place] Reviewed orders for MRI  Brain and CT to assess submandibular lymph node enlargement visualized on MRI Evaluated need for transportation assistance to appointments. Per patient, his son will be able to take him.  Therapeutic listening utilized regarding pain and how it affects his daily life Encouraged patient talk with LCSW regarding psychosocial concerns Discussed family/social support Encouraged to reach out to neurologist with any new or worsening symptoms  Patient Goals/Self-Care Activities: Patient will self administer medications as prescribed Patient will attend all scheduled provider appointments Patient will call pharmacy for medication refills Patient will continue to perform ADL's independently Patient will continue to perform IADL's independently Patient will call provider office for new concerns or questions      Plan:Telephone follow up appointment with care management team member scheduled for:  12/21/20 with RNCM and The patient has been provided with contact information for the care management team and has been advised to call with any health related questions or concerns.   Chong Sicilian, BSN, RN-BC Embedded Chronic Care Manager [facility] [place] [facility] Direct Dial: [phone]

## 2020-12-01 ENCOUNTER — Other Ambulatory Visit: Payer: Self-pay | Admitting: Family

## 2020-12-06 DIAGNOSIS — G500 Trigeminal neuralgia: Secondary | ICD-10-CM | POA: Diagnosis not present

## 2020-12-06 DIAGNOSIS — I729 Aneurysm of unspecified site: Secondary | ICD-10-CM | POA: Diagnosis not present

## 2020-12-13 DIAGNOSIS — J438 Other emphysema: Secondary | ICD-10-CM

## 2020-12-13 DIAGNOSIS — E1142 Type 2 diabetes mellitus with diabetic polyneuropathy: Secondary | ICD-10-CM | POA: Diagnosis not present

## 2020-12-13 DIAGNOSIS — I10 Essential (primary) hypertension: Secondary | ICD-10-CM | POA: Diagnosis not present

## 2020-12-13 DIAGNOSIS — I4891 Unspecified atrial fibrillation: Secondary | ICD-10-CM

## 2020-12-13 DIAGNOSIS — F4323 Adjustment disorder with mixed anxiety and depressed mood: Secondary | ICD-10-CM | POA: Diagnosis not present

## 2020-12-13 DIAGNOSIS — J441 Chronic obstructive pulmonary disease with (acute) exacerbation: Secondary | ICD-10-CM

## 2020-12-14 ENCOUNTER — Other Ambulatory Visit: Payer: Self-pay | Admitting: Family

## 2020-12-14 DIAGNOSIS — J441 Chronic obstructive pulmonary disease with (acute) exacerbation: Secondary | ICD-10-CM

## 2020-12-14 DIAGNOSIS — I10 Essential (primary) hypertension: Secondary | ICD-10-CM

## 2020-12-14 MED ORDER — ALBUTEROL SULFATE HFA 108 (90 BASE) MCG/ACT IN AERS: 2.0000 | INHALATION_SPRAY | Freq: Four times a day (QID) | RESPIRATORY_TRACT | 0 refills | Status: DC | PRN

## 2020-12-14 MED ORDER — CARVEDILOL 3.125 MG PO TABS: 3.1250 mg | ORAL_TABLET | Freq: Two times a day (BID) | ORAL | 0 refills | Status: DC

## 2020-12-14 NOTE — Telephone Encounter (Signed)
Pt aware refill for for mouthwash sent to [facility] for approval And Albuterol & Carvedilol sent to pharmacy All others have refills at pharmacy

## 2020-12-14 NOTE — Telephone Encounter (Signed)
Pt came in stating that he needs [facility], especially his mouth wash Rx.

## 2020-12-16 ENCOUNTER — Ambulatory Visit (INDEPENDENT_AMBULATORY_CARE_PROVIDER_SITE_OTHER): Payer: Medicare Other

## 2020-12-16 ENCOUNTER — Other Ambulatory Visit: Payer: Self-pay | Admitting: Family

## 2020-12-16 DIAGNOSIS — Z23 Encounter for immunization: Secondary | ICD-10-CM

## 2020-12-16 DIAGNOSIS — E785 Hyperlipidemia, unspecified: Secondary | ICD-10-CM

## 2020-12-16 DIAGNOSIS — G500 Trigeminal neuralgia: Secondary | ICD-10-CM

## 2020-12-16 MED ORDER — MAGIC MOUTHWASH W/LIDOCAINE: 10.0000 mL | Freq: Three times a day (TID) | ORAL | 2 refills | Status: DC | PRN

## 2020-12-16 NOTE — Telephone Encounter (Signed)
TC to [facility] [place] to cancel today's refill, [facility] and this medication was RFd to [facility] on 09/28/20 #90 with 4 refills

## 2020-12-21 ENCOUNTER — Ambulatory Visit (INDEPENDENT_AMBULATORY_CARE_PROVIDER_SITE_OTHER): Payer: Medicare Other | Admitting: *Deleted

## 2020-12-21 DIAGNOSIS — E1142 Type 2 diabetes mellitus with diabetic polyneuropathy: Secondary | ICD-10-CM

## 2020-12-21 DIAGNOSIS — G500 Trigeminal neuralgia: Secondary | ICD-10-CM

## 2020-12-21 DIAGNOSIS — I10 Essential (primary) hypertension: Secondary | ICD-10-CM

## 2020-12-23 NOTE — Patient Instructions (Signed)
Visit Information   Patient Goals/Self-Care Activities: Patient will self administer medications as prescribed Patient will attend all scheduled provider appointments Patient will call pharmacy for medication refills Patient will continue to perform ADL's independently Patient will continue to perform IADL's independently Patient will call provider office for new concerns or questions Patient will call [place] as needed [phone] Patient will follow a carb modified ADA diet and limit sodium intake Patient will check blood sugar daily and as needed and will call PCP with any readings outside of recommended range Patient will check and record blood pressure times per week and will call PCP with any readings outside of recommended range  Patient verbalizes understanding of instructions provided today and agrees to view in [place].   Plan:Telephone follow up appointment with care management team member scheduled for:  01/25/21 with RNCM The patient has been provided with contact information for the care management team and has been advised to call with any health related questions or concerns.   Chong Sicilian, BSN, RN-BC Embedded Chronic Care Manager [facility] / [place] Management Direct Dial: [phone]

## 2020-12-23 NOTE — Chronic Care Management (AMB) (Signed)
Chronic Care Management   CCM RN Visit Note  12/21/2020 Name: Matthew [name] MRN: [id number] DOB: 04-26-1960  Subjective: Matthew [name] is a 60 y.o. year old male who is a primary care patient of Junie Spencer, FNP. The care management team was consulted for assistance with disease management and care coordination needs.    Engaged with patient by telephone for follow up visit in response to provider referral for case management and/or care coordination services.   Consent to Services:  The patient was given information about Chronic Care Management services, agreed to services, and gave verbal consent prior to initiation of services.  Please see initial visit note for detailed documentation.   Patient agreed to services and verbal consent obtained.   Assessment: Review of patient past medical history, allergies, medications, health status, including review of consultants reports, laboratory and other test data, was performed as part of comprehensive evaluation and provision of chronic care management services.   SDOH (Social Determinants of Health) assessments and interventions performed:    CCM Care Plan  Allergies  Allergen Reactions   Penicillins Other (See Comments)    Did it involve swelling of the face/tongue/throat, SOB, or low BP? Unknown Did it involve sudden or severe rash/hives, skin peeling, or any reaction on the inside of your mouth or nose? Unknown Did you need to seek medical attention at a hospital or doctor's office? Unknown When did it last happen? childhood  If all above answers are "NO", may proceed with cephalosporin use.     Outpatient Encounter Medications as of 12/21/2020  Medication Sig   albuterol (VENTOLIN HFA) 108 (90 Base) MCG/ACT inhaler Inhale 2 puffs into the lungs every 6 (six) hours as needed for wheezing or shortness of breath.   amLODipine (NORVASC) 10 MG tablet Take 1 tablet (10 mg total) by mouth daily. (NEEDS TO BE SEEN BEFORE NEXT REFILL)    apixaban (ELIQUIS) 5 MG TABS tablet Take 1 tablet (5 mg total) by mouth 2 (two) times daily.   atorvastatin (LIPITOR) 40 MG tablet TAKE 1 TABLET BY MOUTH EVERY DAY   baclofen (LIORESAL) 10 MG tablet TAKE 1 TABLET BY MOUTH THREE TIMES A DAY AS NEEDED FOR MUSCLE SPASMS   blood glucose meter kit and supplies Dispense based on patient and insurance preference. Use up to four times daily as directed. (FOR ICD-10 E10.9, E11.9).   carvedilol (COREG) 3.125 MG tablet Take 1 tablet (3.125 mg total) by mouth 2 (two) times daily with a meal.   DULoxetine (CYMBALTA) 60 MG capsule Take 1 capsule (60 mg total) by mouth daily. (NEEDS TO BE SEEN BEFORE NEXT REFILL)   fluticasone (FLONASE) 50 MCG/ACT nasal spray SPRAY 2 SPRAYS INTO EACH NOSTRIL EVERY DAY   Fluticasone-Umeclidin-Vilant (TRELEGY ELLIPTA) 100-62.5-25 MCG/INH AEPB INHALE 1 PUFF INTO THE LUNGS EVERY DAY   gabapentin (NEURONTIN) 800 MG tablet Take 1 tablet (800 mg total) by mouth in the morning, at noon, in the evening, and at bedtime. (NEEDS TO BE SEEN BEFORE NEXT REFILL)   glucose blood test strip Test BS daily Dx E11.65   lisinopril (ZESTRIL) 5 MG tablet Take 1 tablet (5 mg total) by mouth daily.   loratadine (CLARITIN) 10 MG tablet Take 1 tablet (10 mg total) by mouth daily.   magic mouthwash w/lidocaine SOLN Take 10 mLs by mouth 3 (three) times daily as needed for mouth pain.   metFORMIN (GLUCOPHAGE) 500 MG tablet Take 1 tablet (500 mg total) by mouth daily with breakfast.  OXcarbazepine (TRILEPTAL) 150 MG tablet Take 1 tablet (150 mg total) by mouth 2 (two) times daily.   Vitamin D, Ergocalciferol, (DRISDOL) 1.25 MG (50000 UNIT) CAPS capsule Take 1 capsule (50,000 Units total) by mouth every 7 (seven) days.   No facility-administered encounter medications on file as of 12/21/2020.    Patient Active Problem List   Diagnosis Date Noted   Type 2 diabetes mellitus with diabetic polyneuropathy, without long-term current use of insulin ([place])  05/27/2019   Perirectal abscess 08/28/2018   PAF (paroxysmal atrial fibrillation) ([place]) 08/28/2018   Unable to read or write 05/31/2018   Canker sores oral    Bradycardia    Hypomagnesemia    Hypokalemia 03/20/2018   Atrial fibrillation with RVR ([place]) 03/20/2018   Hyponatremia 03/20/2018   DDD (degenerative disc disease), lumbar 11/13/2017   Femoroacetabular impingement of both hips 11/13/2017   Late effect of cerebrovascular accident (CVA) 10/25/2017   H/O: CVA (cerebrovascular accident) 10/22/2017   Tobacco use 10/22/2017   Depression 12/25/2016   Abnormal drug screen 11/02/2016   Pain management contract broken 03/09/2016   Coronary artery calcification 01/18/2016   Thoracic aortic atherosclerosis ([place]) 01/18/2016   Chronic back pain 08/03/2015   Obesity (BMI 30-39.9) 03/09/2015   Erectile dysfunction 08/24/2014   Vitamin D deficiency 04/18/2014   Hyperlipidemia 04/18/2014   COPD (chronic obstructive pulmonary disease) ([place]) 04/17/2014   Chronic pain syndrome 08/19/2012   Adjustment disorder with mixed anxiety and depressed mood 05/11/2012   Trigeminal neuralgia 05/11/2012   HTN (hypertension) 05/11/2012    Conditions to be addressed/monitored:HTN, DMII, and latent effects of CVA, trigeminal neuralgia  Care Plan : RNCM Care Plan     Problem: Chronic Disease Management Needs   Priority: Medium     Long-Range Goal: Work with [facility] Regarding Care Coordination and Care Management Associated with HTN, DM, latent effects of CVA, and Trigeminal Neuralgia   Start Date: 10/07/2020  Expected End Date: 10/07/2021  This Visit's Progress: On track  Recent Progress: On track  Priority: Medium  Note:   Current Barriers:  Chronic Disease Management support and education needs related to HTN, DMII, and chronic pain due to trigeminal neuralgia  RNCM Clinical Goal(s):  Patient will take all medications exactly as prescribed and will call provider for medication related  questions continue to work with RN Care Manager to address care management and care coordination needs related to HTN, DMII, and trigeminal neuarlgia  through collaboration with RN Care manager, provider, and care team.  Patient will attend all scheduled medical appointments and procedure appointments  Interventions: 1:1 collaboration with primary care provider regarding development and update of comprehensive plan of care as evidenced by provider attestation and co-signature Inter-disciplinary care team collaboration (see longitudinal plan of care) Evaluation of current treatment plan related to self management and patient's adherence to plan as established by provider Provided with [facility] telephone number and mailed business card to home address. Encouraged patient to reach out as needed.    SDOH Barriers (Status: Goal Met.) Short-Term Goal Patient interviewed and SDOH assessment performed Discussed transportation barriers and family/social support Discussed that patient does have a ride to his upcoming appointments. His son is going to assist with this Provided information on transportation assistance and encouraged patient to reach out to CCM team if assistance is needed Collaborated with LCSW    Diabetes:  (Status: Goal on Track (progressing): YES.) Long-Term Goal Lab Results  Component Value Date   HGBA1C 5.7 09/28/2020  Assessed patient's understanding of  A1c goal: <7% Reviewed medications with patient and discussed importance of medication adherence;        Reviewed prescribed diet with patient ADA/Carb Modified ; Counseled on importance of regular laboratory monitoring as prescribed;        Discussed plans with patient for ongoing care management follow up and provided patient with direct contact information for care management team;      Advised patient, providing education and rationale, to check cbg daily and PRN and record        Review of patient status, including review of  consultants reports, relevant laboratory and other test results, and medications completed;       Assessed social determinant of health barriers;          Hypertension: (Status: Goal on Track (progressing): YES.) Long-Term Goal Last practice recorded BP readings:  BP Readings from Last 3 Encounters:  10/15/20 135/75  09/28/20 133/79  06/21/20 139/79  Most recent eGFR/CrCl:  Lab Results  Component Value Date   EGFR 107 09/28/2020    No components found for: CRCL  Evaluation of current treatment plan related to hypertension self management and patient's adherence to plan as established by provider;   Reviewed prescribed diet low sodium/DASH diet Reviewed medications with patient and discussed importance of compliance;  Counseled on the importance of exercise goals with target of 150 minutes per week Discussed plans with patient for ongoing care management follow up and provided patient with direct contact information for care management team; Advised patient, providing education and rationale, to monitor blood pressure daily and record, calling PCP for findings outside established parameters;    Pain:  (Status: Goal on track: YES.) Long-Term Goal Pain assessment performed Medications reviewed and discussed with patient. Discussed importance of adherence to all scheduled medical appointments; Counseled on the importance of reporting any/all new or changed pain symptoms or management strategies to pain management provider; Advised patient to report to care team affect of pain on daily activities; Reviewed and discussed recent office visit with neurologist and neurosurgeon Discussed upcoming appointments and verified that he has transportation Discussed neurology plan of care Therapeutic listening utilized regarding pain and how it affects his daily life Encouraged patient talk with LCSW regarding psychosocial concerns Discussed family/social support Encouraged to reach out to  neurologist with any new or worsening symptoms  Patient Goals/Self-Care Activities: Patient will self administer medications as prescribed Patient will attend all scheduled provider appointments Patient will call pharmacy for medication refills Patient will continue to perform ADL's independently Patient will continue to perform IADL's independently Patient will call provider office for new concerns or questions Patient will call RN Care Manager as needed [phone] Patient will follow a carb modified ADA diet and limit sodium intake Patient will check blood sugar daily and as needed and will call PCP with any readings outside of recommended range Patient will check and record blood pressure times per week and will call PCP with any readings outside of recommended range  Plan:Telephone follow up appointment with care management team member scheduled for:  01/25/21 with [facility] The patient has been provided with contact information for the care management team and has been advised to call with any health related questions or concerns.   Chong Sicilian, BSN, RN-BC Embedded Chronic Care Manager [facility] / [place] [facility] Direct Dial: [phone]

## 2021-01-04 ENCOUNTER — Other Ambulatory Visit: Payer: Self-pay | Admitting: Family

## 2021-01-04 ENCOUNTER — Other Ambulatory Visit: Payer: Self-pay

## 2021-01-04 ENCOUNTER — Encounter: Payer: Self-pay | Admitting: Family

## 2021-01-04 ENCOUNTER — Ambulatory Visit (INDEPENDENT_AMBULATORY_CARE_PROVIDER_SITE_OTHER): Payer: Medicare Other | Admitting: Family

## 2021-01-04 ENCOUNTER — Ambulatory Visit: Payer: Medicare Other | Admitting: Licensed Clinical Social Worker

## 2021-01-04 VITALS — BP 134/82 | HR 47 | Temp 97.0°F | Ht 71.0 in | Wt 219.6 lb

## 2021-01-04 DIAGNOSIS — I6930 Unspecified sequelae of cerebral infarction: Secondary | ICD-10-CM

## 2021-01-04 DIAGNOSIS — E559 Vitamin D deficiency, unspecified: Secondary | ICD-10-CM

## 2021-01-04 DIAGNOSIS — E1142 Type 2 diabetes mellitus with diabetic polyneuropathy: Secondary | ICD-10-CM

## 2021-01-04 DIAGNOSIS — I4891 Unspecified atrial fibrillation: Secondary | ICD-10-CM

## 2021-01-04 DIAGNOSIS — G500 Trigeminal neuralgia: Secondary | ICD-10-CM | POA: Diagnosis not present

## 2021-01-04 DIAGNOSIS — E669 Obesity, unspecified: Secondary | ICD-10-CM

## 2021-01-04 DIAGNOSIS — M5136 Other intervertebral disc degeneration, lumbar region: Secondary | ICD-10-CM

## 2021-01-04 DIAGNOSIS — Z720 Tobacco use: Secondary | ICD-10-CM | POA: Diagnosis not present

## 2021-01-04 DIAGNOSIS — F331 Major depressive disorder, recurrent, moderate: Secondary | ICD-10-CM

## 2021-01-04 DIAGNOSIS — F4323 Adjustment disorder with mixed anxiety and depressed mood: Secondary | ICD-10-CM

## 2021-01-04 DIAGNOSIS — J441 Chronic obstructive pulmonary disease with (acute) exacerbation: Secondary | ICD-10-CM | POA: Diagnosis not present

## 2021-01-04 DIAGNOSIS — E785 Hyperlipidemia, unspecified: Secondary | ICD-10-CM

## 2021-01-04 DIAGNOSIS — I10 Essential (primary) hypertension: Secondary | ICD-10-CM

## 2021-01-04 DIAGNOSIS — I480 Paroxysmal atrial fibrillation: Secondary | ICD-10-CM | POA: Diagnosis not present

## 2021-01-04 LAB — CMP14+EGFR
ALT: 14 IU/L (ref 0–44)
AST: 11 IU/L (ref 0–40)
Albumin/Globulin Ratio: 1.8 (ref 1.2–2.2)
Albumin: 4.4 g/dL (ref 3.8–4.8)
Alkaline Phosphatase: 106 IU/L (ref 44–121)
BUN/Creatinine Ratio: 21 (ref 10–24)
BUN: 15 mg/dL (ref 8–27)
Bilirubin Total: 0.3 mg/dL (ref 0.0–1.2)
CO2: 25 mmol/L (ref 20–29)
Calcium: 9.2 mg/dL (ref 8.6–10.2)
Chloride: 103 mmol/L (ref 96–106)
Creatinine, Ser: 0.72 mg/dL — ABNORMAL LOW (ref 0.76–1.27)
Globulin, Total: 2.4 g/dL (ref 1.5–4.5)
Glucose: 112 mg/dL — ABNORMAL HIGH (ref 70–99)
Potassium: 4.3 mmol/L (ref 3.5–5.2)
Sodium: 140 mmol/L (ref 134–144)
Total Protein: 6.8 g/dL (ref 6.0–8.5)
eGFR: 104 mL/min/{1.73_m2} (ref >59)

## 2021-01-04 LAB — BAYER DCA HB A1C WAIVED: HB A1C (BAYER DCA - WAIVED): 5.4 % (ref 4.8–5.6)

## 2021-01-04 MED ORDER — GABAPENTIN 800 MG PO TABS: 800.0000 mg | ORAL_TABLET | Freq: Four times a day (QID) | ORAL | 3 refills | Status: DC

## 2021-01-04 MED ORDER — DULOXETINE HCL 60 MG PO CPEP: 60.0000 mg | ORAL_CAPSULE | Freq: Every day | ORAL | 3 refills | Status: DC

## 2021-01-04 MED ORDER — CARVEDILOL 3.125 MG PO TABS: 3.1250 mg | ORAL_TABLET | Freq: Two times a day (BID) | ORAL | 0 refills | Status: DC

## 2021-01-04 MED ORDER — METFORMIN HCL 500 MG PO TABS: 500.0000 mg | ORAL_TABLET | Freq: Every day | ORAL | 4 refills | Status: DC

## 2021-01-04 MED ORDER — BACLOFEN 10 MG PO TABS: ORAL_TABLET | ORAL | 1 refills | Status: DC

## 2021-01-04 MED ORDER — AMLODIPINE BESYLATE 10 MG PO TABS: 10.0000 mg | ORAL_TABLET | Freq: Every day | ORAL | 3 refills | Status: DC

## 2021-01-04 MED ORDER — ALBUTEROL SULFATE HFA 108 (90 BASE) MCG/ACT IN AERS: 2.0000 | INHALATION_SPRAY | Freq: Four times a day (QID) | RESPIRATORY_TRACT | 0 refills | Status: DC | PRN

## 2021-01-04 MED ORDER — ATORVASTATIN CALCIUM 40 MG PO TABS: 40.0000 mg | ORAL_TABLET | Freq: Every day | ORAL | 1 refills | Status: DC

## 2021-01-04 MED ORDER — LISINOPRIL 5 MG PO TABS: 5.0000 mg | ORAL_TABLET | Freq: Every day | ORAL | 1 refills | Status: DC

## 2021-01-04 NOTE — Patient Instructions (Signed)
Trigeminal Neuralgia Trigeminal neuralgia is a nerve disorder that causes severe pain on one side of the face. The pain may last from a few seconds to several minutes, but it can happen hundreds of times a day. The pain is usually only on one side of the face. Symptoms may occur for days, weeks, or months and then go away for months or years. The pain may return and be worse than before. What are the causes? This condition may be caused by: Damage or pressure to a nerve in the head that is called the trigeminal nerve. An attack can be triggered by: Talking or chewing. Putting on makeup. Washing, shaving, or touching your face. Brushing your teeth. Blasts of hot or cold air. Primary demyelinating disorders, such as multiple sclerosis. Tumors. What increases the risk? You are more likely to develop this condition if: You are 50-60 years old. You are male. What are the signs or symptoms? The main symptom of this condition is severe pain in the jaw, lips, eyes, nose, scalp, forehead, and face. How is this diagnosed? This condition is diagnosed with a physical exam. A CT scan or an MRI may be done to rule out other conditions that can cause facial pain. How is this treated? This condition may be treated with: Measures to avoid the things that trigger your symptoms. Prescription medicines such as anticonvulsants. Procedures such as ablation, thermal, or radiation therapy. Cognitive or behavioral therapy. Complementary therapies such as: Gentle, regular exercise or yoga. Meditation. Aromatherapy. Acupuncture. Surgery. This may be done in severe cases if other medical treatment does not provide relief. It may take up to one month for treatment to start relieving the pain. Follow these instructions at home: Managing pain  Learn as much as you can about how to manage your pain. Ask your health care provider if a pain specialist would be helpful. Consider talking with a mental health  care provider about how to cope with the pain. Consider joining a pain support group. General instructions Take over-the-counter and prescription medicines only as told by your health care provider. Avoid the things that trigger your symptoms. It may help to: Chew on the unaffected side of your mouth. Avoid touching your face. Avoid blasts of hot or cold air. Keep all follow-up visits. Where to find more information Facial Pain Association: facepain.org Contact a health care provider if: Your medicine is not helping your symptoms. You have side effects from the medicine used for treatment. You develop new, unexplained symptoms, such as: Double vision. Facial weakness or numbness. Changes in hearing or balance. You feel depressed. Get help right away if: Your pain is severe and is not getting better. You develop suicidal thoughts. If you ever feel like you may hurt yourself or others, or have thoughts about taking your own life, get help right away. Go to your nearest emergency department or: Call your local emergency services (911 in the [place] Call a suicide crisis helpline, such as the National Suicide Prevention Lifeline at 1-800-273-8255 or 988 in the [place] This is open 24 hours a day in the [place] Text the Crisis Text Line at 741741 (in the [place] Summary Trigeminal neuralgia is a nerve disorder that causes severe pain on one side of the face. The pain may last from a few seconds to several minutes. This condition is caused by damage or pressure to a nerve in the head that is called the trigeminal nerve. Treatment may include avoiding the things that trigger your symptoms, taking   medicines, or having procedures or surgery. It may take up to one month for treatment to start relieving the pain. Keep all follow-up visits. This information is not intended to replace advice given to you by your health care provider. Make sure you discuss any questions you have with your health care  provider. Document Revised: 08/26/2020 Document Reviewed: 07/26/2020 Elsevier Patient Education  2022 Elsevier Inc.  

## 2021-01-04 NOTE — Progress Notes (Signed)
Subjective:    Patient ID: Matthew [name], male    DOB: 04/08/1960, 60 y.o.   MRN: [id number]  Chief Complaint  Patient presents with   Medical Management of Chronic Issues   Pt presents to the office today for chronic follow up. He reports he has not seen his Cardiologists  in "awhile". He reports his A Fib is stable. He is taking Eliqus 5 mg BID.   He is followed by Neurologists for a hx of CVA with mild left sided weakness and Trigeminal neuralgia. He states trigeminal neuralgia is worsening and does not feel like the gabapentin is working. He reports his pain is intermittent and aching of 8 out 10. He is scheduled for at [facility] for management.   He has COPD and states his breathing is stable. Continues to smoke 1-1 1/2 pack a day. Using the Trelegy daily.    Hypertension This is a chronic problem. The current episode started more than 1 year ago. The problem has been resolved since onset. The problem is controlled. Associated symptoms include blurred vision. Pertinent negatives include no malaise/fatigue, peripheral edema or shortness of breath. Risk factors for coronary artery disease include dyslipidemia, diabetes mellitus, obesity and male gender. The current treatment provides moderate improvement.  Diabetes He presents for his follow-up diabetic visit. He has type 2 diabetes mellitus. Associated symptoms include blurred vision and foot paresthesias. Symptoms are stable. Diabetic complications include heart disease and peripheral neuropathy. Risk factors for coronary artery disease include diabetes mellitus, hypertension, sedentary lifestyle, male sex and dyslipidemia. He is following a generally unhealthy diet. His overall blood glucose range is 90-110 mg/dl.  Back Pain This is a chronic problem. The current episode started more than 1 year ago. The problem occurs intermittently. The pain is at a severity of 0/10. The patient is experiencing no pain.  Hyperlipidemia This is a  chronic problem. The current episode started more than 1 year ago. Exacerbating diseases include obesity. Pertinent negatives include no shortness of breath. Current antihyperlipidemic treatment includes statins. The current treatment provides moderate improvement of lipids. Risk factors for coronary artery disease include dyslipidemia, diabetes mellitus, male sex, hypertension and a sedentary lifestyle.  Depression        This is a chronic problem.  The current episode started more than 1 year ago.   The problem occurs intermittently.  Associated symptoms include irritable and restlessness.  Associated symptoms include no helplessness, no hopelessness and not sad.  Past treatments include SNRIs - Serotonin and norepinephrine reuptake inhibitors. Nicotine Dependence Presents for follow-up visit. His urge triggers include company of smokers. The symptoms have been stable. He smokes 1 pack of cigarettes per day.     Review of Systems  Constitutional:  Negative for malaise/fatigue.  Eyes:  Positive for blurred vision.  Respiratory:  Negative for shortness of breath.   Musculoskeletal:  Positive for back pain.  Psychiatric/Behavioral:  Positive for depression.   All other systems reviewed and are negative.     Objective:   Physical Exam Vitals reviewed.  Constitutional:      General: He is irritable. He is not in acute distress.    Appearance: He is well-developed. He is obese.  HENT:     Head: Normocephalic.     Right Ear: Tympanic membrane normal.     Left Ear: Tympanic membrane normal.  Eyes:     General:        Right eye: No discharge.  Left eye: No discharge.     Pupils: Pupils are equal, round, and reactive to light.  Neck:     Thyroid: No thyromegaly.  Cardiovascular:     Rate and Rhythm: Normal rate and regular rhythm.     Heart sounds: Normal heart sounds. No murmur heard. Pulmonary:     Effort: Pulmonary effort is normal. No respiratory distress.     Breath sounds:  Rhonchi present. No wheezing.  Abdominal:     General: Bowel sounds are normal. There is no distension.     Palpations: Abdomen is soft.     Tenderness: There is no abdominal tenderness.  Musculoskeletal:        General: No tenderness. Normal range of motion.     Cervical back: Normal range of motion and neck supple.  Skin:    General: Skin is warm and dry.     Findings: No erythema or rash.  Neurological:     Mental Status: He is alert and oriented to person, place, and time.     Cranial Nerves: No cranial nerve deficit.     Deep Tendon Reflexes: Reflexes are normal and symmetric.  Psychiatric:        Behavior: Behavior normal.        Thought Content: Thought content normal.        Judgment: Judgment normal.      BP 134/82   Pulse (!) 47   Temp (!) 97 F (36.1 C) (Temporal)   Ht $R'5\' 11"'qr$  (1.803 m)   Wt 219 lb 9.6 oz (99.6 kg)   BMI 30.63 kg/m      Assessment & Plan:  Matthew [name] comes in today with chief complaint of Medical Management of Chronic Issues   Diagnosis and orders addressed:  1. Trigeminal neuralgia Keep appt with Neurologists  - gabapentin (NEURONTIN) 800 MG tablet; Take 1 tablet (800 mg total) by mouth in the morning, at noon, in the evening, and at bedtime. (NEEDS TO BE SEEN BEFORE NEXT REFILL)  Dispense: 120 tablet; Refill: 3 - baclofen (LIORESAL) 10 MG tablet; TAKE 1 TABLET BY MOUTH THREE TIMES A DAY AS NEEDED FOR MUSCLE SPASMS Strength: 10 mg  Dispense: 90 tablet; Refill: 1 - CMP14+EGFR  2. Hyperlipidemia, unspecified hyperlipidemia type - atorvastatin (LIPITOR) 40 MG tablet; Take 1 tablet (40 mg total) by mouth daily.  Dispense: 90 tablet; Refill: 1 - CMP14+EGFR  3. Essential hypertension - lisinopril (ZESTRIL) 5 MG tablet; Take 1 tablet (5 mg total) by mouth daily.  Dispense: 90 tablet; Refill: 1 - CMP14+EGFR  4. Essential (primary) hypertension - carvedilol (COREG) 3.125 MG tablet; Take 1 tablet (3.125 mg total) by mouth 2 (two) times daily  with a meal.  Dispense: 60 tablet; Refill: 0 - CMP14+EGFR  5. Chronic obstructive pulmonary disease with acute exacerbation (HCC) - albuterol (VENTOLIN HFA) 108 (90 Base) MCG/ACT inhaler; Inhale 2 puffs into the lungs every 6 (six) hours as needed for wheezing or shortness of breath.  Dispense: 18 each; Refill: 0 - CMP14+EGFR  6. COPD exacerbation (HCC) - albuterol (VENTOLIN HFA) 108 (90 Base) MCG/ACT inhaler; Inhale 2 puffs into the lungs every 6 (six) hours as needed for wheezing or shortness of breath.  Dispense: 18 each; Refill: 0 - CMP14+EGFR  7. Primary hypertension  8. PAF (paroxysmal atrial fibrillation) ([place])  9. Atrial fibrillation with RVR ([place])  10. Type 2 diabetes mellitus with diabetic polyneuropathy, without long-term current use of insulin (HCC) - Bayer DCA Hb A1c Waived  11.  Moderate episode of recurrent major depressive disorder ([place])  12. Tobacco use  13. Late effect of cerebrovascular accident (CVA)  4. Obesity (BMI 30-39.9)   Labs pending Health Maintenance reviewed Diet and exercise encouraged  Follow up plan: 3 months    Evelina Dun, FNP

## 2021-01-04 NOTE — Chronic Care Management (AMB) (Signed)
Chronic Care Management    Clinical Social Work Note  01/04/2021 Name: Matthew [name] MRN: [id number] DOB: Aug 08, 1960  Matthew [name] is a 60 y.o. year old male who is a primary care patient of [name], FNP. The CCM team was consulted to assist the patient with chronic disease management and/or care coordination needs related to: [employer] .   Engaged with patient by telephone for follow up visit in response to provider referral for social work chronic care management and care coordination services.   Consent to Services:  The patient was given information about Chronic Care Management services, agreed to services, and gave verbal consent prior to initiation of services.  Please see initial visit note for detailed documentation.   Patient agreed to services and consent obtained.   Assessment: Review of patient past medical history, allergies, medications, and health status, including review of relevant consultants reports was performed today as part of a comprehensive evaluation and provision of chronic care management and care coordination services.     SDOH (Social Determinants of Health) assessments and interventions performed:  SDOH Interventions    Flowsheet Row Most Recent Value  SDOH Interventions   Stress Interventions Provide Counseling  [client has financial stress, has stress over managing medical needs.]  Depression Interventions/Treatment  --  [informed client of Matthew [name] support for client]        Advanced Directives Status: See Vynca application for related entries.  CCM Care Plan  Allergies  Allergen Reactions   Penicillins Other (See Comments)    Did it involve swelling of the face/tongue/throat, SOB, or low BP? Unknown Did it involve sudden or severe rash/hives, skin peeling, or any reaction on the inside of your mouth or nose? Unknown Did you need to seek medical attention at a hospital or doctor's office? Unknown When did it last happen? childhood   If all above answers are "NO", may proceed with cephalosporin use.     Outpatient Encounter Medications as of 01/04/2021  Medication Sig   albuterol (VENTOLIN HFA) 108 (90 Base) MCG/ACT inhaler Inhale 2 puffs into the lungs every 6 (six) hours as needed for wheezing or shortness of breath.   amLODipine (NORVASC) 10 MG tablet Take 1 tablet (10 mg total) by mouth daily. (NEEDS TO BE SEEN BEFORE NEXT REFILL)   apixaban (ELIQUIS) 5 MG TABS tablet Take 1 tablet (5 mg total) by mouth 2 (two) times daily.   atorvastatin (LIPITOR) 40 MG tablet Take 1 tablet (40 mg total) by mouth daily.   baclofen (LIORESAL) 10 MG tablet TAKE 1 TABLET BY MOUTH THREE TIMES A DAY AS NEEDED FOR MUSCLE SPASMS Strength: 10 mg   blood glucose meter kit and supplies Dispense based on patient and insurance preference. Use up to four times daily as directed. (FOR ICD-10 E10.9, E11.9).   carvedilol (COREG) 3.125 MG tablet Take 1 tablet (3.125 mg total) by mouth 2 (two) times daily with a meal.   DULoxetine (CYMBALTA) 60 MG capsule Take 1 capsule (60 mg total) by mouth daily. (NEEDS TO BE SEEN BEFORE NEXT REFILL)   fluticasone (FLONASE) 50 MCG/ACT nasal spray SPRAY 2 SPRAYS INTO EACH NOSTRIL EVERY DAY   Fluticasone-Umeclidin-Vilant (TRELEGY ELLIPTA) 100-62.5-25 MCG/INH AEPB INHALE 1 PUFF INTO THE LUNGS EVERY DAY   gabapentin (NEURONTIN) 800 MG tablet Take 1 tablet (800 mg total) by mouth in the morning, at noon, in the evening, and at bedtime. (NEEDS TO BE SEEN BEFORE NEXT REFILL)   glucose blood test strip Test  BS daily Dx E11.65   lisinopril (ZESTRIL) 5 MG tablet Take 1 tablet (5 mg total) by mouth daily.   loratadine (CLARITIN) 10 MG tablet Take 1 tablet (10 mg total) by mouth daily.   magic mouthwash w/lidocaine SOLN Take 10 mLs by mouth 3 (three) times daily as needed for mouth pain.   metFORMIN (GLUCOPHAGE) 500 MG tablet Take 1 tablet (500 mg total) by mouth daily with breakfast.   OXcarbazepine (TRILEPTAL) 150 MG tablet  Take 1 tablet (150 mg total) by mouth 2 (two) times daily.   Vitamin D, Ergocalciferol, (DRISDOL) 1.25 MG (50000 UNIT) CAPS capsule Take 1 capsule (50,000 Units total) by mouth every 7 (seven) days.   No facility-administered encounter medications on file as of 01/04/2021.    Patient Active Problem List   Diagnosis Date Noted   Type 2 diabetes mellitus with diabetic polyneuropathy, without long-term current use of insulin ([place]) 05/27/2019   Perirectal abscess 08/28/2018   PAF (paroxysmal atrial fibrillation) ([place]) 08/28/2018   Unable to read or write 05/31/2018   Canker sores oral    Bradycardia    Hypomagnesemia    Hypokalemia 03/20/2018   Atrial fibrillation with RVR ([place]) 03/20/2018   Hyponatremia 03/20/2018   DDD (degenerative disc disease), lumbar 11/13/2017   Femoroacetabular impingement of both hips 11/13/2017   Late effect of cerebrovascular accident (CVA) 10/25/2017   H/O: CVA (cerebrovascular accident) 10/22/2017   Tobacco use 10/22/2017   Depression 12/25/2016   Abnormal drug screen 11/02/2016   Pain management contract broken 03/09/2016   Coronary artery calcification 01/18/2016   Thoracic aortic atherosclerosis ([place]) 01/18/2016   Chronic back pain 08/03/2015   Obesity (BMI 30-39.9) 03/09/2015   Erectile dysfunction 08/24/2014   Vitamin D deficiency 04/18/2014   Hyperlipidemia 04/18/2014   COPD (chronic obstructive pulmonary disease) ([place]) 04/17/2014   Chronic pain syndrome 08/19/2012   Adjustment disorder with mixed anxiety and depressed mood 05/11/2012   Trigeminal neuralgia 05/11/2012   HTN (hypertension) 05/11/2012    Conditions to be addressed/monitored: monitor client completion of daily ADLs; monitor client management of pain issues   Care Plan : Matthew [name] Care Plan  Updates made by Matthew Cabal, Matthew [name] since 01/04/2021 12:00 AM     Problem: Coping Skills (General Plan of Care)      Goal: Coping Skills Enhanced; Manage Pain issues   Start Date:  01/04/2021  Expected End Date: 04/04/2021  This Visit's Progress: On track  Recent Progress: On track  Priority: Medium  Note:   Current barriers:   Patient in need of assistance with connecting to community resources for possible help with daily activities completion Patient is unable to independently navigate community resource options without care coordination support Pain issues daily Some transport challenges Client cannot read or write  Clinical Goals:  Client to call Matthew [name] as needed in next 30 days to discuss pain issues of client and his management of pain issues faced Client to call RNCM as needed in next 30 days for nursing support Client to attend all scheduled medical appointments in next 30 days Client to complete ADLs daily for next 30 days  Clinical Interventions:  Collaboration with [name], FNP regarding development and update of comprehensive plan of care as evidenced by provider attestation and co-signature Discussed with client the pain issues faced by client Discussed medication procurement of client Discussed with client sleeping issues of client Client sometimes has pain issues that keep client from sleeping.  Reviewed transport needs of client. Client reported  that his son, Matthew [name], often transports client to and from client appointments. Discussed with client the mood of client. Client said that overall he thought his mood was stable.  Client did not mention any particular mood issues. Discussed client housing situation. He said [place].  He said he likes talking with others in the [place] environment.  He likes to socialize with others at the [place] client upcoming medical procedure. Client said he is scheduled  in December of 2022 to have medical procedure related to pain in his mouth and pain in his head.    Patient Coping Skills: Completes ADLs daily Takes medications as prescribed Has support from son, Matthew [name]  Patient Deficits: Pain issues Some communication challenges (history of CVA)  Patient Goals:   Client to call RNCM or Matthew [name] as needed for CCM support in next 30 days In next 30 days, client to attend scheduled medical appointments Client to practice self care, to allow time for rest and relaxation in next 30 days    Follow Up Plan: Matthew [name] to call client or Ganesh Deeg on 03/07/21 at 2:00 PM to assess client needs      Matthew [name] MSW, Matthew [name] Licensed Clinical Social Worker [facility] Care Management [phone]

## 2021-01-04 NOTE — Patient Instructions (Addendum)
Visit Information  Patient Goals: Manage Emotions; Manage Pain issues faced daily  Timeframe:  Short-Term Goal Priority:  Medium Progress: On track Start Date:  01/04/21                             Expected End Date   04/04/21                        Follow Up Date 03/07/21 at 2:00 PM    Manage Emotions; Manage Pain issues faced daily    Why is this important?   When you are stressed, down or upset, your body reacts too.  For example, your blood pressure may get higher; you may have a headache or stomachache.  When your emotions get the best of you, your body's ability to fight off cold and flu gets weak.  These steps will help you manage your emotions.    Patient Coping Skills: Completes ADLs daily Takes medications as prescribed Has support from son, Candy Ziegler  Patient Deficits: Pain issues Some communication challenges (history of CVA)  Patient Goals:   Client to call RNCM or LCSW as needed for CCM support in next 30 days In next 30 days, client to attend scheduled medical appointments Client to practice self care, to allow time for rest and relaxation in next 30 days -  Follow Up Plan: LCSW to call client or Doug Bucklin on 03/07/21 at 2:00 PM to assess client needs   Norva Riffle.Sundra Haddix MSW, LCSW Licensed Clinical Social Worker [facility] Care Management [phone]

## 2021-01-25 ENCOUNTER — Ambulatory Visit: Payer: Medicare Other | Admitting: *Deleted

## 2021-01-25 DIAGNOSIS — G500 Trigeminal neuralgia: Secondary | ICD-10-CM

## 2021-01-25 DIAGNOSIS — E1142 Type 2 diabetes mellitus with diabetic polyneuropathy: Secondary | ICD-10-CM

## 2021-01-25 DIAGNOSIS — I10 Essential (primary) hypertension: Secondary | ICD-10-CM

## 2021-01-25 NOTE — Chronic Care Management (AMB) (Signed)
Chronic Care Management   CCM RN Visit Note  01/25/2021 Name: Matthew [name] MRN: [id number] DOB: 03/29/1960  Subjective: Matthew [name] is a 60 y.o. year old male who is a primary care patient of [name], FNP. The care management team was consulted for assistance with disease management and care coordination needs.    Engaged with patient by telephone for follow up visit in response to provider referral for case management and/or care coordination services.   Consent to Services:  The patient was given information about Chronic Care Management services, agreed to services, and gave verbal consent prior to initiation of services.  Please see initial visit note for detailed documentation.   Patient agreed to services and verbal consent obtained.   Assessment: Review of patient past medical history, allergies, medications, health status, including review of consultants reports, laboratory and other test data, was performed as part of comprehensive evaluation and provision of chronic care management services.   SDOH (Social Determinants of Health) assessments and interventions performed:    CCM Care Plan  Allergies  Allergen Reactions   Penicillins Other (See Comments)    Did it involve swelling of the face/tongue/throat, SOB, or low BP? Unknown Did it involve sudden or severe rash/hives, skin peeling, or any reaction on the inside of your mouth or nose? Unknown Did you need to seek medical attention at a hospital or doctor's office? Unknown When did it last happen? childhood  If all above answers are NO, may proceed with cephalosporin use.     Outpatient Encounter Medications as of 01/25/2021  Medication Sig   albuterol (VENTOLIN HFA) 108 (90 Base) MCG/ACT inhaler Inhale 2 puffs into the lungs every 6 (six) hours as needed for wheezing or shortness of breath.   amLODipine (NORVASC) 10 MG tablet Take 1 tablet (10 mg total) by mouth daily. (NEEDS TO BE SEEN BEFORE NEXT REFILL)    apixaban (ELIQUIS) 5 MG TABS tablet Take 1 tablet (5 mg total) by mouth 2 (two) times daily.   atorvastatin (LIPITOR) 40 MG tablet Take 1 tablet (40 mg total) by mouth daily.   baclofen (LIORESAL) 10 MG tablet TAKE 1 TABLET BY MOUTH THREE TIMES A DAY AS NEEDED FOR MUSCLE SPASMS Strength: 10 mg   blood glucose meter kit and supplies Dispense based on patient and insurance preference. Use up to four times daily as directed. (FOR ICD-10 E10.9, E11.9).   carvedilol (COREG) 3.125 MG tablet Take 1 tablet (3.125 mg total) by mouth 2 (two) times daily with a meal.   DULoxetine (CYMBALTA) 60 MG capsule Take 1 capsule (60 mg total) by mouth daily. (NEEDS TO BE SEEN BEFORE NEXT REFILL)   fluticasone (FLONASE) 50 MCG/ACT nasal spray SPRAY 2 SPRAYS INTO EACH NOSTRIL EVERY DAY   Fluticasone-Umeclidin-Vilant (TRELEGY ELLIPTA) 100-62.5-25 MCG/INH AEPB INHALE 1 PUFF INTO THE LUNGS EVERY DAY   gabapentin (NEURONTIN) 800 MG tablet Take 1 tablet (800 mg total) by mouth in the morning, at noon, in the evening, and at bedtime. (NEEDS TO BE SEEN BEFORE NEXT REFILL)   glucose blood test strip Test BS daily Dx E11.65   lisinopril (ZESTRIL) 5 MG tablet Take 1 tablet (5 mg total) by mouth daily.   loratadine (CLARITIN) 10 MG tablet Take 1 tablet (10 mg total) by mouth daily.   magic mouthwash w/lidocaine SOLN Take 10 mLs by mouth 3 (three) times daily as needed for mouth pain.   metFORMIN (GLUCOPHAGE) 500 MG tablet Take 1 tablet (500 mg total)  mouth daily with breakfast.  ° OXcarbazepine (TRILEPTAL) 150 MG tablet Take 1 tablet (150 mg total) by mouth 2 (two) times daily.  ° Vitamin D, Ergocalciferol, (DRISDOL) 1.25 MG (50000 UNIT) CAPS capsule Take 1 capsule (50,000 Units total) by mouth every 7 (seven) days.  ° °No facility-administered encounter medications on file as of 01/25/2021.  ° ° °Patient Active Problem List  ° Diagnosis Date Noted  ° Type 2 diabetes mellitus with diabetic polyneuropathy, without long-term  current use of insulin (HCC) 05/27/2019  ° Perirectal abscess 08/28/2018  ° PAF (paroxysmal atrial fibrillation) (HCC) 08/28/2018  ° Unable to read or write 05/31/2018  ° Canker sores oral   ° Bradycardia   ° Hypomagnesemia   ° Hypokalemia 03/20/2018  ° Atrial fibrillation with RVR (HCC) 03/20/2018  ° Hyponatremia 03/20/2018  ° DDD (degenerative disc disease), lumbar 11/13/2017  ° Femoroacetabular impingement of both hips 11/13/2017  ° Late effect of cerebrovascular accident (CVA) 10/25/2017  ° H/O: CVA (cerebrovascular accident) 10/22/2017  ° Tobacco use 10/22/2017  ° Depression 12/25/2016  ° Abnormal drug screen 11/02/2016  ° Pain management contract broken 03/09/2016  ° Coronary artery calcification 01/18/2016  ° Thoracic aortic atherosclerosis (HCC) 01/18/2016  ° Chronic back pain 08/03/2015  ° Obesity (BMI 30-39.9) 03/09/2015  ° Erectile dysfunction 08/24/2014  ° Vitamin D deficiency 04/18/2014  ° Hyperlipidemia 04/18/2014  ° COPD (chronic obstructive pulmonary disease) (HCC) 04/17/2014  ° Chronic pain syndrome 08/19/2012  ° Adjustment disorder with mixed anxiety and depressed mood 05/11/2012  ° Trigeminal neuralgia 05/11/2012  ° HTN (hypertension) 05/11/2012  ° ° °Conditions to be addressed/monitored:HTN, DMII, and trigeminal neuralgia ° °Care Plan : LCSW Care Plan  °Updates made by Hudy, Kristen N, RN since 01/25/2021 12:00 AM  °  ° °Problem: Coping Skills (General Plan of Care)   °  ° °Long-Range Goal: Coping Skills Enhanced; Manage Pain issues   °Start Date: 01/04/2021  °Expected End Date: 04/04/2021  °This Visit's Progress: On track  °Recent Progress: On track  °Priority: High  °Note:   °Current barriers:   °Patient in need of assistance with connecting to community resources for possible help with daily activities completion °Patient is unable to independently navigate community resource options without care coordination support °Pain issues daily °Some transport challenges °Client cannot read or  write ° °Clinical Goals:  °Client to call LCSW as needed in next 30 days to discuss pain issues of client and his management of pain issues faced °Client to call RNCM as needed in next 30 days for nursing support °Client to attend all scheduled medical appointments in next 30 days °Client to complete ADLs daily for next 30 days ° °Clinical Interventions:  °Collaboration with Hawks, Christy A, FNP regarding development and update of comprehensive plan of care as evidenced by provider attestation and co-signature °Discussed with client the pain issues faced by client °Discussed medication procurement of client °Discussed with client sleeping issues of client Client sometimes has pain issues that keep client from sleeping.  °Reviewed transport needs of client. Client reported that his son, Justin, often transports client to and from client appointments. °Discussed with client the mood of client. Client said that overall he thought his mood was stable.  Client did not mention any particular mood issues. °Discussed client housing situation. [place]  He said he likes talking with others in the campground environment.  He likes to socialize with others at the campground °Reviewed client upcoming medical procedure. Client said   he is scheduled  in December of 2022 to have medical procedure related to pain in his mouth and pain in his head.   ° °Patient Coping Skills: °Completes ADLs daily °Takes medications as prescribed °Has support from son, Justin Farra ° °Patient Deficits: °Pain issues °Some communication challenges (history of CVA) ° °Patient Goals:  ° °Client to call RNCM or LCSW as needed for CCM support in next 30 days °In next 30 days, client to attend scheduled medical appointments °Client to practice self care, to allow time for rest and relaxation in next 30 days °   °Follow Up Plan: LCSW to call client or Justin Taaffe on 03/07/21 at 2:00 PM to assess client needs  °  ° °Care Plan : RNCM  Care Plan  °Updates made by Hudy, Kristen N, RN since 01/25/2021 12:00 AM  °  ° °Problem: Chronic Disease Management Needs   °Priority: Medium  °  ° °Long-Range Goal: Work with RNCM Regarding Care Coordination and Care Management Associated with HTN, DM, latent effects of CVA, and Trigeminal Neuralgia   °Start Date: 10/07/2020  °Expected End Date: 10/07/2021  °This Visit's Progress: On track  °Recent Progress: On track  °Priority: High  °Note:   °Current Barriers:  °Chronic Disease Management support and education needs related to HTN, DMII, and chronic pain due to trigeminal neuralgia ° °RNCM Clinical Goal(s):  °Patient will take all medications exactly as prescribed and will call provider for medication related questions °continue to work with RN Care Manager to address care management and care coordination needs related to HTN, DMII, and trigeminal neuarlgia  through collaboration with RN Care manager, provider, and care team.  °Patient will attend all scheduled medical appointments and procedure appointments ° °Interventions: °1:1 collaboration with primary care provider regarding development and update of comprehensive plan of care as evidenced by provider attestation and co-signature °Inter-disciplinary care team collaboration (see longitudinal plan of care) °Evaluation of current treatment plan related to self management and patient's adherence to plan as established by provider °Provided with RNCM telephone number and mailed business card to home address. Encouraged patient to reach out as needed.  ° ° °SDOH Barriers (Status: Goal Met.) Short-Term Goal °Patient interviewed and SDOH assessment performed °Discussed transportation barriers and family/social support °Discussed that patient does have a ride to his upcoming appointments. His son is going to assist with this °Provided information on transportation assistance and encouraged patient to reach out to CCM team if assistance is needed °Collaborated with  LCSW  ° ° °Diabetes:  (Status: Goal on Track (progressing): YES.) Long-Term Goal °Lab Results  °Component Value Date  ° HGBA1C 5.4 01/04/2021  ° HGBA1C 5.7 09/28/2020  ° HGBA1C 5.6 05/19/2020  ° °Lab Results  °Component Value Date  ° LDLCALC 46 02/26/2019  ° CREATININE 0.72 (L) 01/04/2021  °Assessed patient's understanding of A1c goal: <7% °Reviewed medications with patient and discussed importance of medication adherence;        °Reviewed prescribed diet with patient ADA/Carb Modified ; °Counseled on importance of regular laboratory monitoring as prescribed;        °Discussed plans with patient for ongoing care management follow up and provided patient with direct contact information for care management team;      °Advised patient, providing education and rationale, to check cbg once daily and when you have symptoms of low or high blood sugar and record        °Review of patient status, including review of consultants reports, relevant laboratory   and other test results, and medications completed;       °Assessed social determinant of health barriers;        ° ° °Hypertension: (Status: Goal on Track (progressing): YES.) Long-Term Goal °Last practice recorded BP readings:  °BP Readings from Last 3 Encounters:  °01/04/21 134/82  °10/15/20 135/75  °09/28/20 133/79  °Evaluation of current treatment plan related to hypertension self management and patient's adherence to plan as established by provider;   °Reviewed prescribed diet low sodium/DASH diet °Reviewed medications with patient and discussed importance of compliance;  °Counseled on the importance of exercise goals with target of 150 minutes per week °Discussed plans with patient for ongoing care management follow up and provided patient with direct contact information for care management team; °Advised patient, providing education and rationale, to monitor blood pressure daily and record, calling PCP for findings outside established parameters;  ° ° °Pain:   (Status: Goal on track: YES.) Long-Term Goal °Pain assessment performed °Medications reviewed and discussed with patient. °Discussed importance of adherence to all scheduled medical appointments; °Counseled on the importance of reporting any/all new or changed pain symptoms or management strategies to pain management provider; °Advised patient to report to care team affect of pain on daily activities; °Reviewed and discussed recent office visit with neurologist and neurosurgeon °Discussed upcoming appointments and verified that he has transportation °Discussed neurology plan of care °Discussed upcoming Gamma Knife surgery in Feb 2023. Patient is hopefully that this will take his trigeminal neuralgia pain away. °Therapeutic listening utilized regarding pain and how it affects his daily life °Discussed family/social support °Encouraged to reach out to neurologist with any new or worsening symptoms ° °Patient Goals/Self-Care Activities: °Patient will self administer medications as prescribed °Patient will attend all scheduled provider appointments °Patient will call pharmacy for medication refills °Patient will continue to perform ADL's independently °Patient will continue to perform IADL's independently °Patient will call provider office for new concerns or questions °Patient will call RN Care Manager as needed [phone] °Patient will follow a carb modified ADA diet and limit sodium intake °Patient will check blood sugar daily and as needed and will call PCP with any readings outside of recommended range °Patient will check and record blood pressure times per week and will call PCP with any readings outside of recommended range ° °Plan:Telephone follow up appointment with care management team member scheduled for:  02/25/21 with RNCM °The patient has been provided with contact information for the care management team and has been advised to call with any health related questions or concerns.  ° °Kristen Hudy, BSN,  RN-BC °Embedded Chronic Care Manager °[facility] °Direct Dial: [phone]  ° ° °  ° ° ° ° ° ° ° ° ° °

## 2021-01-25 NOTE — Patient Instructions (Signed)
Visit Information  Patient Goals/Self-Care Activities: Patient will self administer medications as prescribed Patient will attend all scheduled provider appointments Patient will call pharmacy for medication refills Patient will continue to perform ADL's independently Patient will continue to perform IADL's independently Patient will call provider office for new concerns or questions Patient will call [place] as needed [phone] Patient will follow a carb modified ADA diet and limit sodium intake Patient will check blood sugar daily and as needed and will call PCP with any readings outside of recommended range Patient will check and record blood pressure times per week and will call PCP with any readings outside of recommended range  Patient verbalizes understanding of instructions provided today and agrees to view in [place].   Plan:Telephone follow up appointment with care management team member scheduled for:  02/25/21 with RNCM The patient has been provided with contact information for the care management team and has been advised to call with any health related questions or concerns.   Chong Sicilian, BSN, RN-BC Embedded Chronic Care Manager [facility] [place] [facility] Direct Dial: [phone]

## 2021-01-30 ENCOUNTER — Other Ambulatory Visit: Payer: Self-pay | Admitting: Family

## 2021-01-30 DIAGNOSIS — G500 Trigeminal neuralgia: Secondary | ICD-10-CM

## 2021-01-31 ENCOUNTER — Ambulatory Visit: Payer: Medicare Other | Admitting: Neurology

## 2021-02-11 ENCOUNTER — Other Ambulatory Visit: Payer: Self-pay | Admitting: Neurology

## 2021-02-11 DIAGNOSIS — Z203 Contact with and (suspected) exposure to rabies: Secondary | ICD-10-CM | POA: Diagnosis not present

## 2021-02-11 DIAGNOSIS — Z28310 Unvaccinated for covid-19: Secondary | ICD-10-CM | POA: Diagnosis not present

## 2021-02-11 DIAGNOSIS — Y929 Unspecified place or not applicable: Secondary | ICD-10-CM | POA: Diagnosis not present

## 2021-02-11 DIAGNOSIS — W540XXA Bitten by dog, initial encounter: Secondary | ICD-10-CM | POA: Diagnosis not present

## 2021-02-11 DIAGNOSIS — Y939 Activity, unspecified: Secondary | ICD-10-CM | POA: Diagnosis not present

## 2021-02-11 DIAGNOSIS — S61452A Open bite of left hand, initial encounter: Secondary | ICD-10-CM | POA: Diagnosis not present

## 2021-02-11 DIAGNOSIS — R200 Anesthesia of skin: Secondary | ICD-10-CM | POA: Diagnosis not present

## 2021-02-11 DIAGNOSIS — Z23 Encounter for immunization: Secondary | ICD-10-CM | POA: Diagnosis not present

## 2021-02-14 ENCOUNTER — Other Ambulatory Visit: Payer: Self-pay | Admitting: Neurology

## 2021-02-14 DIAGNOSIS — Z203 Contact with and (suspected) exposure to rabies: Secondary | ICD-10-CM | POA: Diagnosis not present

## 2021-02-14 DIAGNOSIS — Z23 Encounter for immunization: Secondary | ICD-10-CM | POA: Diagnosis not present

## 2021-02-14 DIAGNOSIS — Y939 Activity, unspecified: Secondary | ICD-10-CM | POA: Diagnosis not present

## 2021-02-14 DIAGNOSIS — S61452A Open bite of left hand, initial encounter: Secondary | ICD-10-CM | POA: Diagnosis not present

## 2021-02-14 DIAGNOSIS — W540XXA Bitten by dog, initial encounter: Secondary | ICD-10-CM | POA: Diagnosis not present

## 2021-02-18 DIAGNOSIS — Y929 Unspecified place or not applicable: Secondary | ICD-10-CM | POA: Diagnosis not present

## 2021-02-18 DIAGNOSIS — Z23 Encounter for immunization: Secondary | ICD-10-CM | POA: Diagnosis not present

## 2021-02-18 DIAGNOSIS — Y939 Activity, unspecified: Secondary | ICD-10-CM | POA: Diagnosis not present

## 2021-02-18 DIAGNOSIS — W540XXA Bitten by dog, initial encounter: Secondary | ICD-10-CM | POA: Diagnosis not present

## 2021-02-18 DIAGNOSIS — Z203 Contact with and (suspected) exposure to rabies: Secondary | ICD-10-CM | POA: Diagnosis not present

## 2021-02-18 DIAGNOSIS — S61452A Open bite of left hand, initial encounter: Secondary | ICD-10-CM | POA: Diagnosis not present

## 2021-02-25 ENCOUNTER — Ambulatory Visit (INDEPENDENT_AMBULATORY_CARE_PROVIDER_SITE_OTHER): Payer: Medicare Other | Admitting: *Deleted

## 2021-02-25 DIAGNOSIS — Z203 Contact with and (suspected) exposure to rabies: Secondary | ICD-10-CM | POA: Diagnosis not present

## 2021-02-25 DIAGNOSIS — E1142 Type 2 diabetes mellitus with diabetic polyneuropathy: Secondary | ICD-10-CM

## 2021-02-25 DIAGNOSIS — G500 Trigeminal neuralgia: Secondary | ICD-10-CM

## 2021-02-25 DIAGNOSIS — I10 Essential (primary) hypertension: Secondary | ICD-10-CM

## 2021-02-25 DIAGNOSIS — S61452A Open bite of left hand, initial encounter: Secondary | ICD-10-CM | POA: Diagnosis not present

## 2021-02-25 DIAGNOSIS — Y929 Unspecified place or not applicable: Secondary | ICD-10-CM | POA: Diagnosis not present

## 2021-02-25 DIAGNOSIS — Z23 Encounter for immunization: Secondary | ICD-10-CM | POA: Diagnosis not present

## 2021-02-25 DIAGNOSIS — Y939 Activity, unspecified: Secondary | ICD-10-CM | POA: Diagnosis not present

## 2021-02-25 DIAGNOSIS — W540XXA Bitten by dog, initial encounter: Secondary | ICD-10-CM | POA: Diagnosis not present

## 2021-02-25 NOTE — Chronic Care Management (AMB) (Signed)
Chronic Care Management   CCM RN Visit Note  02/25/2021 Name: Matthew [name] MRN: [id number] DOB: 1960/10/27  Subjective: Matthew [name] is a 61 y.o. year old male who is a primary care patient of [name], FNP. The care management team was consulted for assistance with disease management and care coordination needs.    Engaged with patient by telephone for follow up visit in response to provider referral for case management and/or care coordination services.   Consent to Services:  The patient was given information about Chronic Care Management services, agreed to services, and gave verbal consent prior to initiation of services.  Please see initial visit note for detailed documentation.   Patient agreed to services and verbal consent obtained.   Assessment: Review of patient past medical history, allergies, medications, health status, including review of consultants reports, laboratory and other test data, was performed as part of comprehensive evaluation and provision of chronic care management services.   SDOH (Social Determinants of Health) assessments and interventions performed:    CCM Care Plan  Allergies  Allergen Reactions   Penicillins Other (See Comments)    Did it involve swelling of the face/tongue/throat, SOB, or low BP? Unknown Did it involve sudden or severe rash/hives, skin peeling, or any reaction on the inside of your mouth or nose? Unknown Did you need to seek medical attention at a hospital or doctor's office? Unknown When did it last happen? childhood  If all above answers are NO, may proceed with cephalosporin use.     Outpatient Encounter Medications as of 02/25/2021  Medication Sig   albuterol (VENTOLIN HFA) 108 (90 Base) MCG/ACT inhaler Inhale 2 puffs into the lungs every 6 (six) hours as needed for wheezing or shortness of breath.   amLODipine (NORVASC) 10 MG tablet Take 1 tablet (10 mg total) by mouth daily. (NEEDS TO BE SEEN BEFORE NEXT REFILL)    apixaban (ELIQUIS) 5 MG TABS tablet Take 1 tablet (5 mg total) by mouth 2 (two) times daily.   atorvastatin (LIPITOR) 40 MG tablet Take 1 tablet (40 mg total) by mouth daily.   baclofen (LIORESAL) 10 MG tablet TAKE 1 TABLET BY MOUTH THREE TIMES A DAY AS NEEDED FOR MUSCLE SPASMS   blood glucose meter kit and supplies Dispense based on patient and insurance preference. Use up to four times daily as directed. (FOR ICD-10 E10.9, E11.9).   carvedilol (COREG) 3.125 MG tablet Take 1 tablet (3.125 mg total) by mouth 2 (two) times daily with a meal.   DULoxetine (CYMBALTA) 60 MG capsule Take 1 capsule (60 mg total) by mouth daily. (NEEDS TO BE SEEN BEFORE NEXT REFILL)   fluticasone (FLONASE) 50 MCG/ACT nasal spray SPRAY 2 SPRAYS INTO EACH NOSTRIL EVERY DAY   Fluticasone-Umeclidin-Vilant (TRELEGY ELLIPTA) 100-62.5-25 MCG/INH AEPB INHALE 1 PUFF INTO THE LUNGS EVERY DAY   gabapentin (NEURONTIN) 800 MG tablet Take 1 tablet (800 mg total) by mouth in the morning, at noon, in the evening, and at bedtime. (NEEDS TO BE SEEN BEFORE NEXT REFILL)   glucose blood test strip Test BS daily Dx E11.65   lisinopril (ZESTRIL) 5 MG tablet Take 1 tablet (5 mg total) by mouth daily.   loratadine (CLARITIN) 10 MG tablet Take 1 tablet (10 mg total) by mouth daily.   magic mouthwash w/lidocaine SOLN Take 10 mLs by mouth 3 (three) times daily as needed for mouth pain.   metFORMIN (GLUCOPHAGE) 500 MG tablet Take 1 tablet (500 mg total) by mouth daily with  breakfast.   OXcarbazepine (TRILEPTAL) 150 MG tablet Take 1 tablet (150 mg total) by mouth 2 (two) times daily.   Vitamin D, Ergocalciferol, (DRISDOL) 1.25 MG (50000 UNIT) CAPS capsule Take 1 capsule (50,000 Units total) by mouth every 7 (seven) days.   No facility-administered encounter medications on file as of 02/25/2021.    Patient Active Problem List   Diagnosis Date Noted   Type 2 diabetes mellitus with diabetic polyneuropathy, without long-term current use of insulin  ([place]) 05/27/2019   Perirectal abscess 08/28/2018   PAF (paroxysmal atrial fibrillation) ([place]) 08/28/2018   Unable to read or write 05/31/2018   Canker sores oral    Bradycardia    Hypomagnesemia    Hypokalemia 03/20/2018   Atrial fibrillation with RVR ([place]) 03/20/2018   Hyponatremia 03/20/2018   DDD (degenerative disc disease), lumbar 11/13/2017   Femoroacetabular impingement of both hips 11/13/2017   Late effect of cerebrovascular accident (CVA) 10/25/2017   H/O: CVA (cerebrovascular accident) 10/22/2017   Tobacco use 10/22/2017   Depression 12/25/2016   Abnormal drug screen 11/02/2016   Pain management contract broken 03/09/2016   Coronary artery calcification 01/18/2016   Thoracic aortic atherosclerosis ([place]) 01/18/2016   Chronic back pain 08/03/2015   Obesity (BMI 30-39.9) 03/09/2015   Erectile dysfunction 08/24/2014   Vitamin D deficiency 04/18/2014   Hyperlipidemia 04/18/2014   COPD (chronic obstructive pulmonary disease) ([place]) 04/17/2014   Chronic pain syndrome 08/19/2012   Adjustment disorder with mixed anxiety and depressed mood 05/11/2012   Trigeminal neuralgia 05/11/2012   HTN (hypertension) 05/11/2012    Conditions to be addressed/monitored:HTN, DMII, and trigeminal neuralgia  Care Plan : [facility] Care Plan  Updates made by Ilean China, RN since 02/25/2021 12:00 AM     Problem: Chronic Disease Management Needs   Priority: Medium     Long-Range Goal: Work with [facility] Regarding Care Coordination and Care Management Associated with HTN, DM, latent effects of CVA, and Trigeminal Neuralgia   Start Date: 10/07/2020  Expected End Date: 10/07/2021  This Visit's Progress: On track  Recent Progress: On track  Priority: High  Note:   Current Barriers:  Chronic Disease Management support and education needs related to HTN, DMII, and chronic pain due to trigeminal neuralgia  RNCM Clinical Goal(s):  Patient will take all medications exactly as prescribed and will call  provider for medication related questions continue to work with RN Care Manager to address care management and care coordination needs related to HTN, DMII, and trigeminal neuarlgia  through collaboration with RN Care manager, provider, and care team.  Patient will attend all scheduled medical appointments and procedure appointments  Interventions: 1:1 collaboration with primary care provider regarding development and update of comprehensive plan of care as evidenced by provider attestation and co-signature Inter-disciplinary care team collaboration (see longitudinal plan of care) Evaluation of current treatment plan related to self management and patient's adherence to plan as established by provider Provided with [facility] telephone number and mailed business card to home address. Encouraged patient to reach out as needed.    SDOH Barriers (Status: Goal Met.) Short-Term Goal Patient interviewed and SDOH assessment performed Discussed transportation barriers and family/social support Discussed that patient does have a ride to his upcoming appointments. His son is going to assist with this Provided information on transportation assistance and encouraged patient to reach out to CCM team if assistance is needed Collaborated with LCSW    Diabetes:  (Status: Condition stable. Not addressed this visit.) Long-Term Goal Lab Results  Component  Value Date   HGBA1C 5.4 01/04/2021   HGBA1C 5.7 09/28/2020   HGBA1C 5.6 05/19/2020   Lab Results  Component Value Date   LDLCALC 46 02/26/2019   CREATININE 0.72 (L) 01/04/2021  Assessed patient's understanding of A1c goal: <7% Reviewed medications with patient and discussed importance of medication adherence;        Reviewed prescribed diet with patient ADA/Carb Modified ; Counseled on importance of regular laboratory monitoring as prescribed;        Discussed plans with patient for ongoing care management follow up and provided patient with direct contact  information for care management team;      Advised patient, providing education and rationale, to check cbg once daily and when you have symptoms of low or high blood sugar and record        Review of patient status, including review of consultants reports, relevant laboratory and other test results, and medications completed;       Assessed social determinant of health barriers;          Hypertension: (Status: Condition stable. Not addressed this visit.) Long-Term Goal Last practice recorded BP readings:  BP Readings from Last 3 Encounters:  01/04/21 134/82  10/15/20 135/75  09/28/20 133/79   Evaluation of current treatment plan related to hypertension self management and patient's adherence to plan as established by provider;   Reviewed prescribed diet low sodium/DASH diet Reviewed medications with patient and discussed importance of compliance;  Counseled on the importance of exercise goals with target of 150 minutes per week Discussed plans with patient for ongoing care management follow up and provided patient with direct contact information for care management team; Advised patient, providing education and rationale, to monitor blood pressure daily and record, calling PCP for findings outside established parameters;    Pain:  (Status: Goal on track: YES.) Long-Term Goal Pain assessment performed Medications reviewed and discussed with patient. Discussed importance of adherence to all scheduled medical appointments; Counseled on the importance of reporting any/all new or changed pain symptoms or management strategies to pain management provider; Advised patient to report to care team affect of pain on daily activities; Reviewed and discussed recent office visit with neurologist and neurosurgeon Discussed upcoming appointments and verified that he has transportation Discussed neurology plan of care Discussed upcoming Gamma Knife surgery in Feb 2023. Patient is hopefully that this  will take his trigeminal neuralgia pain away. Therapeutic listening utilized regarding pain and how it affects his daily life Discussed family/social support Encouraged to reach out to neurologist with any new or worsening symptoms  Patient Goals/Self-Care Activities: Patient will self administer medications as prescribed Patient will attend all scheduled provider appointments Patient will call pharmacy for medication refills Patient will continue to perform ADL's independently Patient will continue to perform IADL's independently Patient will call provider office for new concerns or questions Patient will call RN Care Manager as needed [phone] Patient will follow a carb modified ADA diet and limit sodium intake Patient will check blood sugar daily and as needed and will call PCP with any readings outside of recommended range Patient will check and record blood pressure times per week and will call PCP with any readings outside of recommended range  Plan:Telephone follow up appointment with care management team member scheduled for:  03/25/21 with RNCM The patient has been provided with contact information for the care management team and has been advised to call with any health related questions or concerns.   Chong Sicilian, BSN, RN-BC  [place] [facility] [place] Management Direct Dial: [phone]

## 2021-02-25 NOTE — Patient Instructions (Signed)
Visit Information  Patient Goals/Self-Care Activities: Patient will self administer medications as prescribed Patient will attend all scheduled provider appointments Patient will call pharmacy for medication refills Patient will continue to perform ADL's independently Patient will continue to perform IADL's independently Patient will call provider office for new concerns or questions Patient will call [place] as needed [phone] Patient will follow a carb modified ADA diet and limit sodium intake Patient will check blood sugar daily and as needed and will call PCP with any readings outside of recommended range Patient will check and record blood pressure times per week and will call PCP with any readings outside of recommended range  Patient verbalizes understanding of instructions and care plan provided today and agrees to view in [place]. Active MyChart status confirmed with patient.    Plan:Telephone follow up appointment with care management team member scheduled for:  03/25/21 with RNCM The patient has been provided with contact information for the care management team and has been advised to call with any health related questions or concerns.   Chong Sicilian, BSN, RN-BC Embedded Chronic Care Manager [facility] / [place] Management Direct Dial: [phone]

## 2021-03-05 ENCOUNTER — Other Ambulatory Visit: Payer: Self-pay | Admitting: Family

## 2021-03-05 DIAGNOSIS — G500 Trigeminal neuralgia: Secondary | ICD-10-CM

## 2021-03-07 ENCOUNTER — Ambulatory Visit: Payer: Medicare Other | Admitting: Licensed Clinical Social Worker

## 2021-03-07 DIAGNOSIS — J441 Chronic obstructive pulmonary disease with (acute) exacerbation: Secondary | ICD-10-CM

## 2021-03-07 DIAGNOSIS — G500 Trigeminal neuralgia: Secondary | ICD-10-CM

## 2021-03-07 DIAGNOSIS — I480 Paroxysmal atrial fibrillation: Secondary | ICD-10-CM

## 2021-03-07 DIAGNOSIS — E785 Hyperlipidemia, unspecified: Secondary | ICD-10-CM

## 2021-03-07 DIAGNOSIS — I6930 Unspecified sequelae of cerebral infarction: Secondary | ICD-10-CM

## 2021-03-07 DIAGNOSIS — F4323 Adjustment disorder with mixed anxiety and depressed mood: Secondary | ICD-10-CM

## 2021-03-07 DIAGNOSIS — E1142 Type 2 diabetes mellitus with diabetic polyneuropathy: Secondary | ICD-10-CM

## 2021-03-07 DIAGNOSIS — M5136 Other intervertebral disc degeneration, lumbar region: Secondary | ICD-10-CM

## 2021-03-07 DIAGNOSIS — E559 Vitamin D deficiency, unspecified: Secondary | ICD-10-CM

## 2021-03-07 DIAGNOSIS — I10 Essential (primary) hypertension: Secondary | ICD-10-CM

## 2021-03-07 NOTE — Patient Instructions (Addendum)
Visit Information  Patient goals:  Manage Emotions. Manage Pain issues faced daily  Timeframe:  Short-Term Goal Priority:  High  Progress: On track Start Date:    03/07/21                               Expected End Date   05/31/21                        Follow Up Date 05/02/21 at 3:00 PM   Manage Emotions; Manage Pain issues faced daily    Why is this important?   When you are stressed, down or upset, your body reacts too.  For example, your blood pressure may get higher; you may have a headache or stomachache.  When your emotions get the best of you, your body's ability to fight off cold and flu gets weak.  These steps will help you manage your emotions.    Patient Coping Skills: Completes ADLs daily Takes medications as prescribed Has support from son, Dijon Cosens  Patient Deficits: Pain issues Some communication challenges (history of CVA)  Patient Goals:   Client to call RNCM or LCSW as needed for CCM support in next 30 days In next 30 days, client to attend scheduled medical appointments Client to practice self care, to allow time for rest and relaxation in next 30 days -  Follow Up Plan: LCSW to call client or Tramel [place], son of client , on 05/02/21 at 3:00 PM to assess client needs   Norva Riffle.Rosealynn Mateus MSW, [place] representative [facility] Care Management [phone]

## 2021-03-07 NOTE — Chronic Care Management (AMB) (Signed)
Chronic Care Management    Clinical Social Work Note  03/07/2021 Name: Matthew [name] MRN: [id number] DOB: 1960/05/30  Matthew [name] is a 61 y.o. year old male who is a primary care patient of [name], FNP. The CCM team was consulted to assist the patient with chronic disease management and/or care coordination needs related to: [employer] .   Engaged with patient by telephone for follow up visit in response to provider referral for social work chronic care management and care coordination services.   Consent to Services:  The patient was given information about Chronic Care Management services, agreed to services, and gave verbal consent prior to initiation of services.  Please see initial visit note for detailed documentation.   Patient agreed to services and consent obtained.   Assessment: Review of patient past medical history, allergies, medications, and health status, including review of relevant consultants reports was performed today as part of a comprehensive evaluation and provision of chronic care management and care coordination services.     SDOH (Social Determinants of Health) assessments and interventions performed:  SDOH Interventions    Flowsheet Row Most Recent Value  SDOH Interventions   Stress Interventions Provide Counseling  [client has stress related to managing medical needs]  Depression Interventions/Treatment  --  [informed client of LCSW support and of RNCM support]        Advanced Directives Status: See Vynca application for related entries.  CCM Care Plan  Allergies  Allergen Reactions   Penicillins Other (See Comments)    Did it involve swelling of the face/tongue/throat, SOB, or low BP? Unknown Did it involve sudden or severe rash/hives, skin peeling, or any reaction on the inside of your mouth or nose? Unknown Did you need to seek medical attention at a hospital or doctor's office? Unknown When did it last happen? childhood  If all  above answers are NO, may proceed with cephalosporin use.     Outpatient Encounter Medications as of 03/07/2021  Medication Sig   albuterol (VENTOLIN HFA) 108 (90 Base) MCG/ACT inhaler Inhale 2 puffs into the lungs every 6 (six) hours as needed for wheezing or shortness of breath.   amLODipine (NORVASC) 10 MG tablet Take 1 tablet (10 mg total) by mouth daily. (NEEDS TO BE SEEN BEFORE NEXT REFILL)   apixaban (ELIQUIS) 5 MG TABS tablet Take 1 tablet (5 mg total) by mouth 2 (two) times daily.   atorvastatin (LIPITOR) 40 MG tablet Take 1 tablet (40 mg total) by mouth daily.   baclofen (LIORESAL) 10 MG tablet TAKE 1 TABLET BY MOUTH THREE TIMES A DAY AS NEEDED FOR MUSCLE SPASMS   blood glucose meter kit and supplies Dispense based on patient and insurance preference. Use up to four times daily as directed. (FOR ICD-10 E10.9, E11.9).   carvedilol (COREG) 3.125 MG tablet Take 1 tablet (3.125 mg total) by mouth 2 (two) times daily with a meal.   DULoxetine (CYMBALTA) 60 MG capsule Take 1 capsule (60 mg total) by mouth daily. (NEEDS TO BE SEEN BEFORE NEXT REFILL)   fluticasone (FLONASE) 50 MCG/ACT nasal spray SPRAY 2 SPRAYS INTO EACH NOSTRIL EVERY DAY   Fluticasone-Umeclidin-Vilant (TRELEGY ELLIPTA) 100-62.5-25 MCG/INH AEPB INHALE 1 PUFF INTO THE LUNGS EVERY DAY   gabapentin (NEURONTIN) 800 MG tablet Take 1 tablet (800 mg total) by mouth in the morning, at noon, in the evening, and at bedtime. (NEEDS TO BE SEEN BEFORE NEXT REFILL)   glucose blood test strip Test BS daily Dx  E11.65   lisinopril (ZESTRIL) 5 MG tablet Take 1 tablet (5 mg total) by mouth daily.   loratadine (CLARITIN) 10 MG tablet Take 1 tablet (10 mg total) by mouth daily.   magic mouthwash w/lidocaine SOLN Take 10 mLs by mouth 3 (three) times daily as needed for mouth pain.   metFORMIN (GLUCOPHAGE) 500 MG tablet Take 1 tablet (500 mg total) by mouth daily with breakfast.   OXcarbazepine (TRILEPTAL) 150 MG tablet Take 1 tablet (150 mg  total) by mouth 2 (two) times daily.   Vitamin D, Ergocalciferol, (DRISDOL) 1.25 MG (50000 UNIT) CAPS capsule Take 1 capsule (50,000 Units total) by mouth every 7 (seven) days.   No facility-administered encounter medications on file as of 03/07/2021.    Patient Active Problem List   Diagnosis Date Noted   Type 2 diabetes mellitus with diabetic polyneuropathy, without long-term current use of insulin ([place]) 05/27/2019   Perirectal abscess 08/28/2018   PAF (paroxysmal atrial fibrillation) ([place]) 08/28/2018   Unable to read or write 05/31/2018   Canker sores oral    Bradycardia    Hypomagnesemia    Hypokalemia 03/20/2018   Atrial fibrillation with RVR ([place]) 03/20/2018   Hyponatremia 03/20/2018   DDD (degenerative disc disease), lumbar 11/13/2017   Femoroacetabular impingement of both hips 11/13/2017   Late effect of cerebrovascular accident (CVA) 10/25/2017   H/O: CVA (cerebrovascular accident) 10/22/2017   Tobacco use 10/22/2017   Depression 12/25/2016   Abnormal drug screen 11/02/2016   Pain management contract broken 03/09/2016   Coronary artery calcification 01/18/2016   Thoracic aortic atherosclerosis ([place]) 01/18/2016   Chronic back pain 08/03/2015   Obesity (BMI 30-39.9) 03/09/2015   Erectile dysfunction 08/24/2014   Vitamin D deficiency 04/18/2014   Hyperlipidemia 04/18/2014   COPD (chronic obstructive pulmonary disease) ([place]) 04/17/2014   Chronic pain syndrome 08/19/2012   Adjustment disorder with mixed anxiety and depressed mood 05/11/2012   Trigeminal neuralgia 05/11/2012   HTN (hypertension) 05/11/2012    Conditions to be addressed/monitored: monitor client management of pain issues; monitor client completion of ADLs  Care Plan : LCSW Care Plan  Updates made by [name], LCSW since 03/07/2021 12:00 AM     Problem: Coping Skills (General Plan of Care)      Goal: Coping Skills Enhanced; Manage Pain issues   Start Date: 03/07/2021  Expected End Date:  05/31/2021  This Visit's Progress: On track  Recent Progress: On track  Priority: High  Note:   Current barriers:   Patient in need of assistance with connecting to community resources for possible help with daily activities completion Patient is unable to independently navigate community resource options without care coordination support Pain issues daily Some transport challenges Client cannot read or write Depression issues; anxiety issues  Clinical Goals:  Client to call LCSW as needed in next 30 days to discuss pain issues of client and his management of pain issues faced Client to call RNCM as needed in next 30 days for nursing support Client to attend all scheduled medical appointments in next 30 days Client to complete ADLs daily for next 30 days  Clinical Interventions:  Collaboration with [name], FNP regarding development and update of comprehensive plan of care as evidenced by provider attestation and co-signature Discussed with client the pain issues faced by client. Client spoke of pain in his head and pain in his mouth.  He is scheduled to have medical procedure in [place], [place] on 03/17/21.  He is hoping that  procedure will help with his head pain issue and mouth pain issues Discussed medication procurement of client Discussed with client sleeping issues of client Client sometimes has pain issues that keep client from sleeping.  Reviewed transport needs of client. Client reported that his son, Matthew [name], often transports client to and from client appointments. Discussed with client the mood of client. Client said that overall he thought his mood was stable. He is hoping that procedure on 03/17/21 will help him with pain issues faced.  Discussed client housing situation. He said [place]  He lives in a camper.  .He said he likes talking with others in the [place] environment.  He likes to socialize with others at the [place] client  to call RNCM as needed in next 30 days for nursing support. Provided counseling support for client.    Patient Coping Skills: Completes ADLs daily Takes medications as prescribed Has support from son, Matthew [name]  Patient Deficits: Pain issues Some communication challenges (history of CVA)  Patient Goals:   Client to call RNCM or LCSW as needed for CCM support in next 30 days In next 30 days, client to attend scheduled medical appointments Client to practice self care, to allow time for rest and relaxation in next 30 days    Follow Up Plan: LCSW to call client or Matthew [name], son of client, on 05/02/21 at 3:00 PM to assess client needs     Matthew [name] MSW, [place] [facility] representative [facility] Care Management [phone]

## 2021-03-11 ENCOUNTER — Other Ambulatory Visit: Payer: Self-pay | Admitting: *Deleted

## 2021-03-11 MED ORDER — MAGIC MOUTHWASH W/LIDOCAINE: 10.0000 mL | Freq: Three times a day (TID) | ORAL | 2 refills | Status: DC | PRN

## 2021-03-15 DIAGNOSIS — F17210 Nicotine dependence, cigarettes, uncomplicated: Secondary | ICD-10-CM | POA: Diagnosis not present

## 2021-03-15 DIAGNOSIS — E1159 Type 2 diabetes mellitus with other circulatory complications: Secondary | ICD-10-CM

## 2021-03-15 DIAGNOSIS — I10 Essential (primary) hypertension: Secondary | ICD-10-CM

## 2021-03-16 ENCOUNTER — Other Ambulatory Visit: Payer: Self-pay | Admitting: Family Medicine

## 2021-03-17 DIAGNOSIS — G500 Trigeminal neuralgia: Secondary | ICD-10-CM | POA: Diagnosis not present

## 2021-03-23 ENCOUNTER — Ambulatory Visit (INDEPENDENT_AMBULATORY_CARE_PROVIDER_SITE_OTHER): Payer: Medicare Other

## 2021-03-23 VITALS — Wt 210.0 lb

## 2021-03-23 DIAGNOSIS — Z0001 Encounter for general adult medical examination with abnormal findings: Secondary | ICD-10-CM

## 2021-03-23 DIAGNOSIS — Z720 Tobacco use: Secondary | ICD-10-CM

## 2021-03-23 DIAGNOSIS — F17200 Nicotine dependence, unspecified, uncomplicated: Secondary | ICD-10-CM

## 2021-03-23 DIAGNOSIS — Z0000 Encounter for general adult medical examination without abnormal findings: Secondary | ICD-10-CM

## 2021-03-23 DIAGNOSIS — Z122 Encounter for screening for malignant neoplasm of respiratory organs: Secondary | ICD-10-CM | POA: Diagnosis not present

## 2021-03-23 NOTE — Patient Instructions (Signed)
Mr. Funari , Thank you for taking time to come for your Medicare Wellness Visit. I appreciate your ongoing commitment to your health goals. Please review the following plan we discussed and let me know if I can assist you in the future.   Screening recommendations/referrals: Colonoscopy: declines Recommended yearly ophthalmology/optometry visit for glaucoma screening and checkup Recommended yearly dental visit for hygiene and checkup  Vaccinations: Influenza vaccine: Done 12/16/2020 - Repeat annually  Pneumococcal vaccine: Done 08/22/2012 - due for Prevnar Tdap vaccine: Done 12/05/2015 - Repeat in 10 years  Shingles vaccine: Done 09/28/2020 - due for second dose   Covid-19: Declines  Advanced directives: Advance directive discussed with you today. Even though you declined this today, please call our office should you change your mind, and we can give you the proper paperwork for you to fill out.   Conditions/risks identified: Aim for 30 minutes of exercise or brisk walking each day, drink 6-8 glasses of water and eat lots of fruits and vegetables.  If you wish to quit smoking, help is available. For free tobacco cessation program offerings call the [facility] at 330-166-4409 or Live Well Line at (714)162-2406. You may also visit [email or web address] or email [email or web address] for more information on other programs.   You may also call 1-800-QUIT-NOW 646-259-2049) or visit www.[facility].dk or www.[email or web address] for additional resources on smoking cessation.    Next appointment: Follow up in one year for your annual wellness visit   Preventive Care 40-64 Years, Male Preventive care refers to lifestyle choices and visits with your health care provider that can promote health and wellness. What does preventive care include? A yearly physical exam. This is also called an annual well check. Dental exams once or twice a year. Routine eye exams. Ask your health care  provider how often you should have your eyes checked. Personal lifestyle choices, including: Daily care of your teeth and gums. Regular physical activity. Eating a healthy diet. Avoiding tobacco and drug use. Limiting alcohol use. Practicing safe sex. Taking low-dose aspirin every day starting at age 38. What happens during an annual well check? The services and screenings done by your health care provider during your annual well check will depend on your age, overall health, lifestyle risk factors, and family history of disease. Counseling  Your health care provider may ask you questions about your: Alcohol use. Tobacco use. Drug use. Emotional well-being. Home and relationship well-being. Sexual activity. Eating habits. Work and work Statistician. Screening  You may have the following tests or measurements: Height, weight, and BMI. Blood pressure. Lipid and cholesterol levels. These may be checked every 5 years, or more frequently if you are over 21 years old. Skin check. Lung cancer screening. You may have this screening every year starting at age 37 if you have a 30-pack-year history of smoking and currently smoke or have quit within the past 15 years. Fecal occult blood test (FOBT) of the stool. You may have this test every year starting at age 18. Flexible sigmoidoscopy or colonoscopy. You may have a sigmoidoscopy every 5 years or a colonoscopy every 10 years starting at age 76. Prostate cancer screening. Recommendations will vary depending on your family history and other risks. Hepatitis C blood test. Hepatitis B blood test. Sexually transmitted disease (STD) testing. Diabetes screening. This is done by checking your blood sugar (glucose) after you have not eaten for a while (fasting). You may have this done every 1-3 years. Discuss your test results, treatment options, and  if necessary, the need for more tests with your health care provider. Vaccines  Your health care  provider may recommend certain vaccines, such as: Influenza vaccine. This is recommended every year. Tetanus, diphtheria, and acellular pertussis (Tdap, Td) vaccine. You may need a Td booster every 10 years. Zoster vaccine. You may need this after age 57. Pneumococcal 13-valent conjugate (PCV13) vaccine. You may need this if you have certain conditions and have not been vaccinated. Pneumococcal polysaccharide (PPSV23) vaccine. You may need one or two doses if you smoke cigarettes or if you have certain conditions. Talk to your health care provider about which screenings and vaccines you need and how often you need them. This information is not intended to replace advice given to you by your health care provider. Make sure you discuss any questions you have with your health care provider. Document Released: 02/26/2015 Document Revised: 10/20/2015 Document Reviewed: 12/01/2014 Elsevier Interactive Patient Education  2017 [place] Prevention in the Home Falls can cause injuries. They can happen to people of all ages. There are many things you can do to make your home safe and to help prevent falls. What can I do on the outside of my home? Regularly fix the edges of walkways and driveways and fix any cracks. Remove anything that might make you trip as you walk through a door, such as a raised step or threshold. Trim any bushes or trees on the path to your home. Use bright outdoor lighting. Clear any walking paths of anything that might make someone trip, such as rocks or tools. Regularly check to see if handrails are loose or broken. Make sure that both sides of any steps have handrails. Any raised decks and porches should have guardrails on the edges. Have any leaves, snow, or ice cleared regularly. Use sand or salt on walking paths during winter. Clean up any spills in your garage right away. This includes oil or grease spills. What can I do in the bathroom? Use night  lights. Install grab bars by the toilet and in the tub and shower. Do not use towel bars as grab bars. Use non-skid mats or decals in the tub or shower. If you need to sit down in the shower, use a plastic, non-slip stool. Keep the floor dry. Clean up any water that spills on the floor as soon as it happens. Remove soap buildup in the tub or shower regularly. Attach bath mats securely with double-sided non-slip rug tape. Do not have throw rugs and other things on the floor that can make you trip. What can I do in the bedroom? Use night lights. Make sure that you have a light by your bed that is easy to reach. Do not use any sheets or blankets that are too big for your bed. They should not hang down onto the floor. Have a firm chair that has side arms. You can use this for support while you get dressed. Do not have throw rugs and other things on the floor that can make you trip. What can I do in the kitchen? Clean up any spills right away. Avoid walking on wet floors. Keep items that you use a lot in easy-to-reach places. If you need to reach something above you, use a strong step stool that has a grab bar. Keep electrical cords out of the way. Do not use floor polish or wax that makes floors slippery. If you must use wax, use non-skid floor wax. Do not have throw rugs  and other things on the floor that can make you trip. What can I do with my stairs? Do not leave any items on the stairs. Make sure that there are handrails on both sides of the stairs and use them. Fix handrails that are broken or loose. Make sure that handrails are as long as the stairways. Check any carpeting to make sure that it is firmly attached to the stairs. Fix any carpet that is loose or worn. Avoid having throw rugs at the top or bottom of the stairs. If you do have throw rugs, attach them to the floor with carpet tape. Make sure that you have a light switch at the top of the stairs and the bottom of the stairs. If  you do not have them, ask someone to add them for you. What else can I do to help prevent falls? Wear shoes that: Do not have high heels. Have rubber bottoms. Are comfortable and fit you well. Are closed at the toe. Do not wear sandals. If you use a stepladder: Make sure that it is fully opened. Do not climb a closed stepladder. Make sure that both sides of the stepladder are locked into place. Ask someone to hold it for you, if possible. Clearly mark and make sure that you can see: Any grab bars or handrails. First and last steps. Where the edge of each step is. Use tools that help you move around (mobility aids) if they are needed. These include: Canes. Walkers. Scooters. Crutches. Turn on the lights when you go into a dark area. Replace any light bulbs as soon as they burn out. Set up your furniture so you have a clear path. Avoid moving your furniture around. If any of your floors are uneven, fix them. If there are any pets around you, be aware of where they are. Review your medicines with your doctor. Some medicines can make you feel dizzy. This can increase your chance of falling. Ask your doctor what other things that you can do to help prevent falls. This information is not intended to replace advice given to you by your health care provider. Make sure you discuss any questions you have with your health care provider. Document Released: 11/26/2008 Document Revised: 07/08/2015 Document Reviewed: 03/06/2014 Elsevier Interactive Patient Education  2017 [employer].

## 2021-03-23 NOTE — Progress Notes (Signed)
Subjective:   Matthew [name] is a 61 y.o. male who presents for Medicare Annual/Subsequent preventive examination.  Virtual Visit via Telephone Note  I connected with  [name] on 03/23/21 at  9:00 AM EST by telephone and verified that I am speaking with the correct person using two identifiers.  Location: Patient: Home Provider: [facility] Persons participating in the virtual visit: patient/Nurse Health Advisor   I discussed the limitations, risks, security and privacy concerns of performing an evaluation and management service by telephone and the availability of in person appointments. The patient expressed understanding and agreed to proceed.  Interactive audio and video telecommunications were attempted between this nurse and patient, however failed, due to patient having technical difficulties OR patient did not have access to video capability.  We continued and completed visit with audio only.  Some vital signs may be absent or patient reported.   Rishaan Gunner E Nailyn Dearinger, LPN   Review of Systems     Cardiac Risk Factors include: advanced age (>19mn, >>75women);diabetes mellitus;smoking/ tobacco exposure;sedentary lifestyle;male gender;dyslipidemia;hypertension;family history of premature cardiovascular disease;Other (see comment), Risk factor comments: A.Fib, COPD, hx of TIA, atherosclerosis     Objective:    Today's Vitals   03/23/21 0909  Weight: 210 lb (95.3 kg)  PainSc: 4    Body mass index is 29.29 kg/m.  Advanced Directives 03/23/2021 10/15/2020 03/22/2020 03/19/2019 09/02/2018 08/28/2018 08/28/2018  Does Patient Have a Medical Advance Directive? _0  No No  Would patient like information on creating a medical advance directive? No - Patient declined - No - Patient declined No - Patient declined No - Patient declined No - Patient declined No - Patient declined    Current Medications (verified) Outpatient Encounter Medications as of 03/23/2021  Medication Sig   albuterol  (VENTOLIN HFA) 108 (90 Base) MCG/ACT inhaler Inhale 2 puffs into the lungs every 6 (six) hours as needed for wheezing or shortness of breath.   amLODipine (NORVASC) 10 MG tablet Take 1 tablet (10 mg total) by mouth daily. (NEEDS TO BE SEEN BEFORE NEXT REFILL)   apixaban (ELIQUIS) 5 MG TABS tablet Take 1 tablet (5 mg total) by mouth 2 (two) times daily.   atorvastatin (LIPITOR) 40 MG tablet Take 1 tablet (40 mg total) by mouth daily.   baclofen (LIORESAL) 10 MG tablet TAKE 1 TABLET BY MOUTH THREE TIMES A DAY AS NEEDED FOR MUSCLE SPASMS   blood glucose meter kit and supplies Dispense based on patient and insurance preference. Use up to four times daily as directed. (FOR ICD-10 E10.9, E11.9).   carvedilol (COREG) 3.125 MG tablet Take 1 tablet (3.125 mg total) by mouth 2 (two) times daily with a meal.   DULoxetine (CYMBALTA) 60 MG capsule Take 1 capsule (60 mg total) by mouth daily. (NEEDS TO BE SEEN BEFORE NEXT REFILL)   fluticasone (FLONASE) 50 MCG/ACT nasal spray SPRAY 2 SPRAYS INTO EACH NOSTRIL EVERY DAY   Fluticasone-Umeclidin-Vilant (TRELEGY ELLIPTA) 100-62.5-25 MCG/INH AEPB INHALE 1 PUFF INTO THE LUNGS EVERY DAY   gabapentin (NEURONTIN) 800 MG tablet Take 1 tablet (800 mg total) by mouth in the morning, at noon, in the evening, and at bedtime. (NEEDS TO BE SEEN BEFORE NEXT REFILL)   glucose blood test strip Test BS daily Dx E11.65   levETIRAcetam (KEPPRA) 500 MG tablet Take 500 mg by mouth 2 (two) times daily.   lidocaine (XYLOCAINE) 2 % solution SMARTSIG:By Mouth   lisinopril (ZESTRIL) 5 MG tablet Take 1 tablet (5  mg total) by mouth daily.   loratadine (CLARITIN) 10 MG tablet Take 1 tablet (10 mg total) by mouth daily.   magic mouthwash w/lidocaine SOLN Take 10 mLs by mouth 3 (three) times daily as needed for mouth pain.   metFORMIN (GLUCOPHAGE) 500 MG tablet Take 1 tablet (500 mg total) by mouth daily with breakfast.   OXcarbazepine (TRILEPTAL) 150 MG tablet Take 1 tablet (150 mg total) by  mouth 2 (two) times daily.   Vitamin D, Ergocalciferol, (DRISDOL) 1.25 MG (50000 UNIT) CAPS capsule Take 1 capsule (50,000 Units total) by mouth every 7 (seven) days.   No facility-administered encounter medications on file as of 03/23/2021.    Allergies (verified) Penicillins   History: Past Medical History:  Diagnosis Date   Abscess    Anxiety    Aphthous ulcer    Arthritis    Asthma    Chronic bronchitis    Chronic pain    COPD (chronic obstructive pulmonary disease) (HCC)    CVA (cerebral vascular accident) ([facility])    Depression    Hyperlipidemia    Hypertension    Trigeminal neuralgia    Vertigo    Past Surgical History:  Procedure Laterality Date   CHOLECYSTECTOMY     HAND RECONSTRUCTION Right 1990's   TRIGEMINAL NERVE DECOMPRESSION Right    Family History  Problem Relation Age of Onset   Hypertension Mother    Diabetes Mother    Hypertension Father    Diabetes Father    Cancer Father    Emphysema Father    Early death Brother    Social History   Socioeconomic History   Marital status: Legally Separated    Spouse name: Not on file   Number of children: 3   Years of education: 9   Highest education level: 9th grade  Occupational History   Occupation: Disabled  Tobacco Use   Smoking status: Every Day    Packs/day: 2.00    Years: 40.00    Pack years: 80.00    Types: Cigarettes    Start date: 07/23/1975   Smokeless tobacco: Never  Vaping Use   Vaping Use: Never used  Substance and Sexual Activity   Alcohol use: Not Currently    Comment: 08/21/2012 "quit drinking ~ 1985"   Drug use: No   Sexual activity: Yes  Other Topics Concern   Not on file  Social History Narrative   Highest level of education: 9th grade   Right handed   Drinks caffeine   [place]      Social Determinants of Health   Financial Resource Strain: Low Risk    Difficulty of Paying Living Expenses: Not very hard  Food Insecurity: Food Insecurity Present    Worried About Running Out of Food in the Last Year: Sometimes true   Ran Out of Food in the Last Year: Never true  Transportation Needs: No Transportation Needs   Lack of Transportation (Medical): No   Lack of Transportation (Non-Medical): No  Physical Activity: Insufficiently Active   Days of Exercise per Week: 7 days   Minutes of Exercise per Session: 20 min  Stress: No Stress Concern Present   Feeling of Stress : Only a little  Social Connections: Socially Isolated   Frequency of Communication with Friends and Family: More than three times a week   Frequency of Social Gatherings with Friends and Family: More than three times a week   Attends Religious Services: Never   Active  Member of Clubs or Organizations: No   Attends Music therapist: Never   Marital Status: Separated    Tobacco Counseling Ready to quit: No Counseling given: Yes   Clinical Intake:  Pre-visit preparation completed: Yes  Pain : 0-10 Pain Score: 4  Pain Type: Acute pain, Neuropathic pain Pain Location: Head Pain Orientation: Right Pain Descriptors / Indicators: Aching, Sharp, Sore Pain Onset: In the past 7 days Pain Frequency: Intermittent     BMI - recorded: 29.29 Nutritional Status: BMI 25 -29 Overweight Nutritional Risks: None Diabetes: Yes CBG done?: No Did pt. bring in CBG monitor from home?: No  How often do you need to have someone help you when you read instructions, pamphlets, or other written materials from your doctor or pharmacy?: 4 - Often  Diabetic? Nutrition Risk Assessment:  Has the patient had any N/V/D within the last 2 months?  No  Does the patient have any non-healing wounds?  No  Has the patient had any unintentional weight loss or weight gain?  No   Diabetes:  Is the patient diabetic?  Yes  If diabetic, was a CBG obtained today?  No  Did the patient bring in their glucometer from home?  No  How often do you monitor your CBG's? never.   Financial  Strains and Diabetes Management:  Are you having any financial strains with the device, your supplies or your medication? No .  Does the patient want to be seen by Chronic Care Management for management of their diabetes?  No  Would the patient like to be referred to a Nutritionist or for Diabetic Management?  No   Diabetic Exams:  Diabetic Eye Exam: Overdue for diabetic eye exam. Pt has been advised about the importance in completing this exam. Patient declined referral.  Diabetic Foot Exam: Completed 01/12/2020. Pt has been advised about the importance in completing this exam. Pt is scheduled for diabetic foot exam on 04/07/2021.    Interpreter Needed?: No  Information entered by :: Gurbani Figge, LPN   Activities of Daily Living In your present state of health, do you have any difficulty performing the following activities: 03/23/2021 10/07/2020  Hearing? N N  Vision? N -  Difficulty concentrating or making decisions? Y -  Walking or climbing stairs? N -  Dressing or bathing? N -  Doing errands, shopping? N -  Preparing Food and eating ? N -  Using the Toilet? N -  In the past six months, have you accidently leaked urine? N -  Do you have problems with loss of bowel control? N -  Managing your Medications? N -  Managing your Finances? N -  Housekeeping or managing your Housekeeping? N -  Some recent data might be hidden    Patient Care Team: [name], FNP as PCP - General (Nurse Practitioner) Satira Sark, MD as PCP - Cardiology (Cardiology) [name], LCSW as [place] Management (Licensed Clinical Social Worker) Ilean China, RN as Case Manager  Indicate any recent [place] you may have received from other than [facility] providers in the past year (date may be approximate).     Assessment:   This is a routine wellness examination for [place].  Hearing/Vision screen Hearing Screening - Comments:: Denies hearing difficulties    Vision Screening - Comments:: No vision concerns at this time - no eye doctor  Dietary issues and exercise activities discussed: Current Exercise Habits: Home exercise routine, Type of exercise: walking,  Time (Minutes): 20, Frequency (Times/Week): 7, Weekly Exercise (Minutes/Week): 140, Intensity: Mild, Exercise limited by: neurologic condition(s);respiratory conditions(s)   Goals Addressed             This Visit's Progress    AWV Goal   On track    03/19/2019 AWV Goal: Fall Prevention  Over the next year, patient will decrease their risk for falls by: Using assistive devices, such as a cane or walker, as needed Identifying fall risks within their home and correcting them by: Removing throw rugs Adding handrails to stairs or ramps Removing clutter and keeping a clear pathway throughout the home Increasing light, especially at night Adding shower handles/bars Raising toilet seat Identifying potential personal risk factors for falls: Medication side effects Incontinence/urgency Vestibular dysfunction Hearing loss Musculoskeletal disorders Neurological disorders Orthostatic hypotension       AWV Goal   On track    03/19/2019 AWV Goal: Diabetes Management  Patient will maintain an A1C level below 8.0 Patient will not develop any diabetic foot complications Patient will not experience any hypoglycemic episodes over the next 3 months Patient will notify our office of any CBG readings outside of the provider recommended range by calling [phone] Patient will adhere to provider recommendations for diabetes management  Patient Self Management Activities take all medications as prescribed and report any negative side effects monitor and record blood sugar readings as directed adhere to a low carbohydrate diet that incorporates lean proteins, vegetables, whole grains, low glycemic fruits check feet daily noting any sores, cracks, injuries, or callous formations see PCP or  podiatrist if he notices any changes in his legs, feet, or toenails Patient will visit PCP and have an A1C level checked every 3 to 6 months as directed  have a yearly eye exam to monitor for vascular changes associated with diabetes and will request that the report be sent to his pcp.  consult with his PCP regarding any changes in his health or new or worsening symptoms        Depression Screen PHQ 2/9 Scores 03/23/2021 03/07/2021 01/04/2021 11/16/2020 10/06/2020 09/28/2020 06/21/2020  PHQ - 2 Score _0 0  PHQ- 9 Score _1 -    Fall Risk Fall Risk  10/15/2020 09/28/2020 06/21/2020 05/19/2020 03/22/2020  Falls in the past year? 0 0 0 0 0  Comment - - - - -  Number falls in past yr: 0 - - - -  Injury with Fall? 0 - - - -  Risk for fall due to : - - - - -  Follow up - - - - -    FALL RISK PREVENTION PERTAINING TO THE HOME:  Any stairs in or around the home? No  If so, are there any without handrails? No  Home free of loose throw rugs in walkways, pet beds, electrical cords, etc? Yes  Adequate lighting in your home to reduce risk of falls? Yes   ASSISTIVE DEVICES UTILIZED TO PREVENT FALLS:  Life alert? No  Use of a cane, walker or w/c? No  Grab bars in the bathroom? No  Shower chair or bench in shower? No  Elevated toilet seat or a handicapped toilet? No   TIMED UP AND GO:  Was the test performed? No . Telephonic visit  Cognitive Function:     6CIT Screen 03/23/2021 03/22/2020 03/19/2019  What Year? 0 points 0 points 0 points  What month? 3 points 3 points 3 points  What  time? 0 points 0 points 3 points  Count back from 20 0 points 4 points 0 points  Months in reverse 2 points 4 points 4 points  Repeat phrase 8 points 10 points 10 points  Total Score _0 Immunizations Immunization History  Administered Date(s) Administered   Influenza,inj,Quad PF,6+ Mos 01/13/2014, 11/24/2014, 12/02/2015, 10/23/2017, 10/25/2018, 11/18/2019, 12/16/2020   Pneumococcal  Polysaccharide-23 08/22/2012   Tdap 12/05/2015   Zoster Recombinat (Shingrix) 09/28/2020    TDAP status: Up to date  Flu Vaccine status: Up to date  Pneumococcal vaccine status: Due, Education has been provided regarding the importance of this vaccine. Advised may receive this vaccine at local pharmacy or Health Dept. Aware to provide a copy of the vaccination record if obtained from local pharmacy or Health Dept. Verbalized acceptance and understanding.  Covid-19 vaccine status: Declined, Education has been provided regarding the importance of this vaccine but patient still declined. Advised may receive this vaccine at local pharmacy or Health Dept.or vaccine clinic. Aware to provide a copy of the vaccination record if obtained from local pharmacy or Health Dept. Verbalized acceptance and understanding.  Qualifies for Shingles Vaccine? Yes   Zostavax completed No   Shingrix Completed?: No.    Education has been provided regarding the importance of this vaccine. Patient has been advised to call insurance company to determine out of pocket expense if they have not yet received this vaccine. Advised may also receive vaccine at local pharmacy or Health Dept. Verbalized acceptance and understanding.  Screening Tests Health Maintenance  Topic Date Due   COVID-19 Vaccine (1) Never done   OPHTHALMOLOGY EXAM  Never done   FOOT EXAM  01/11/2021   URINE MICROALBUMIN  01/11/2021   Zoster Vaccines- Shingrix (2 of 2) 04/06/2021 (Originally 11/23/2020)   Fecal DNA (Cologuard)  01/04/2022 (Originally 07/22/2004)   HEMOGLOBIN A1C  07/04/2021   TETANUS/TDAP  12/04/2025   INFLUENZA VACCINE  Completed   Hepatitis C Screening  Completed   HIV Screening  Completed   HPV VACCINES  Aged Out    Health Maintenance  Health Maintenance Due  Topic Date Due   COVID-19 Vaccine (1) Never done   OPHTHALMOLOGY EXAM  Never done   FOOT EXAM  01/11/2021   URINE MICROALBUMIN  01/11/2021    Declines colorectal  cancer screening  Lung Cancer Screening: (Low Dose CT Chest recommended if Age 94-80 years, 30 pack-year currently smoking OR have quit w/in 15years.) does qualify.   Lung Cancer Screening Referral: last done 2017 - re-sent referral today  Additional Screening:  Hepatitis C Screening: does qualify; Completed 11/24/2014  Vision Screening: Recommended annual ophthalmology exams for early detection of glaucoma and other disorders of the eye. Is the patient up to date with their annual eye exam?  No  Who is the provider or what is the name of the office in which the patient attends annual eye exams? none If pt is not established with a provider, would they like to be referred to a provider to establish care? No .   Dental Screening: Recommended annual dental exams for proper oral hygiene  Community Resource Referral / Chronic Care Management: CRR required this visit?  No   CCM required this visit?  No      Plan:     I have personally reviewed and noted the following in the patients chart:   Medical and social history Use of alcohol, tobacco or illicit drugs  Current medications and supplements including opioid  prescriptions. Patient is not currently taking opioid prescriptions. Functional ability and status Nutritional status Physical activity Advanced directives List of other physicians Hospitalizations, surgeries, and ER visits in previous 12 months Vitals Screenings to include cognitive, depression, and falls Referrals and appointments  In addition, I have reviewed and discussed with patient certain preventive protocols, quality metrics, and best practice recommendations. A written personalized care plan for preventive services as well as general preventive health recommendations were provided to patient.   Due to this being a telephonic visit, the after visit summary with patients personalized plan was offered to patient via mail or my-chart. Patient declined at this  time. He cannot read.  Sandrea Hammond, LPN   09/13/173   Nurse Notes: unable to verify patients medications - I asked him to bring them all in to next visit for review. Also, I tried to send Lung cancer screening order, but error came up saying it isn't covered. Maybe you can review this. Thanks!

## 2021-03-25 ENCOUNTER — Ambulatory Visit (INDEPENDENT_AMBULATORY_CARE_PROVIDER_SITE_OTHER): Payer: Medicare Other | Admitting: *Deleted

## 2021-03-25 DIAGNOSIS — E1142 Type 2 diabetes mellitus with diabetic polyneuropathy: Secondary | ICD-10-CM

## 2021-03-25 DIAGNOSIS — I10 Essential (primary) hypertension: Secondary | ICD-10-CM

## 2021-03-25 DIAGNOSIS — G500 Trigeminal neuralgia: Secondary | ICD-10-CM

## 2021-03-25 DIAGNOSIS — G894 Chronic pain syndrome: Secondary | ICD-10-CM

## 2021-03-25 NOTE — Chronic Care Management (AMB) (Signed)
Chronic Care Management   CCM RN Visit Note  03/25/2021 Name: Matthew [name] MRN: [id number] DOB: Feb 04, 1961  Subjective: Matthew [name] is a 61 y.o. year old male who is a primary care patient of Matthew Balloon, FNP. The care management team was consulted for assistance with disease management and care coordination needs.    Engaged with patient by telephone for follow up visit in response to provider referral for case management and/or care coordination services.   Consent to Services:  The patient was given information about Chronic Care Management services, agreed to services, and gave verbal consent prior to initiation of services.  Please see initial visit note for detailed documentation.   Patient agreed to services and verbal consent obtained.   Assessment: Review of patient past medical history, allergies, medications, health status, including review of consultants reports, laboratory and other test data, was performed as part of comprehensive evaluation and provision of chronic care management services.   SDOH (Social Determinants of Health) assessments and interventions performed:    CCM Care Plan  Allergies  Allergen Reactions   Penicillins Other (See Comments)    Did it involve swelling of the face/tongue/throat, SOB, or low BP? Unknown Did it involve sudden or severe rash/hives, skin peeling, or any reaction on the inside of your mouth or nose? Unknown Did you need to seek medical attention at a [name] or doctor's office? Unknown When did it last happen? childhood  If all above answers are NO, may proceed with cephalosporin use.     Outpatient Encounter Medications as of 03/25/2021  Medication Sig   albuterol (VENTOLIN HFA) 108 (90 Base) MCG/ACT inhaler Inhale 2 puffs into the lungs every 6 (six) hours as needed for wheezing or shortness of breath.   amLODipine (NORVASC) 10 MG tablet Take 1 tablet (10 mg total) by mouth daily. (NEEDS TO BE SEEN BEFORE NEXT REFILL)    apixaban (ELIQUIS) 5 MG TABS tablet Take 1 tablet (5 mg total) by mouth 2 (two) times daily.   atorvastatin (LIPITOR) 40 MG tablet Take 1 tablet (40 mg total) by mouth daily.   baclofen (LIORESAL) 10 MG tablet TAKE 1 TABLET BY MOUTH THREE TIMES A DAY AS NEEDED FOR MUSCLE SPASMS   blood glucose meter kit and supplies Dispense based on patient and insurance preference. Use up to four times daily as directed. (FOR ICD-10 E10.9, E11.9).   carvedilol (COREG) 3.125 MG tablet Take 1 tablet (3.125 mg total) by mouth 2 (two) times daily with a meal.   DULoxetine (CYMBALTA) 60 MG capsule Take 1 capsule (60 mg total) by mouth daily. (NEEDS TO BE SEEN BEFORE NEXT REFILL)   fluticasone (FLONASE) 50 MCG/ACT nasal spray SPRAY 2 SPRAYS INTO EACH NOSTRIL EVERY DAY   Fluticasone-Umeclidin-Vilant (TRELEGY ELLIPTA) 100-62.5-25 MCG/INH AEPB INHALE 1 PUFF INTO THE LUNGS EVERY DAY   gabapentin (NEURONTIN) 800 MG tablet Take 1 tablet (800 mg total) by mouth in the morning, at noon, in the evening, and at bedtime. (NEEDS TO BE SEEN BEFORE NEXT REFILL)   glucose blood test strip Test BS daily Dx E11.65   levETIRAcetam (KEPPRA) 500 MG tablet Take 500 mg by mouth 2 (two) times daily.   lidocaine (XYLOCAINE) 2 % solution SMARTSIG:By Mouth   lisinopril (ZESTRIL) 5 MG tablet Take 1 tablet (5 mg total) by mouth daily.   loratadine (CLARITIN) 10 MG tablet Take 1 tablet (10 mg total) by mouth daily.   magic mouthwash w/lidocaine SOLN Take 10 mLs by mouth  3 (three) times daily as needed for mouth pain.   metFORMIN (GLUCOPHAGE) 500 MG tablet Take 1 tablet (500 mg total) by mouth daily with breakfast.   OXcarbazepine (TRILEPTAL) 150 MG tablet Take 1 tablet (150 mg total) by mouth 2 (two) times daily.   Vitamin D, Ergocalciferol, (DRISDOL) 1.25 MG (50000 UNIT) CAPS capsule Take 1 capsule (50,000 Units total) by mouth every 7 (seven) days.   No facility-administered encounter medications on file as of 03/25/2021.    Patient Active  Problem List   Diagnosis Date Noted   Type 2 diabetes mellitus with diabetic polyneuropathy, without long-term current use of insulin ([place]) 05/27/2019   Perirectal abscess 08/28/2018   PAF (paroxysmal atrial fibrillation) ([place]) 08/28/2018   Unable to read or write 05/31/2018   Canker sores oral    Bradycardia    Hypomagnesemia    Hypokalemia 03/20/2018   Atrial fibrillation with RVR ([place]) 03/20/2018   Hyponatremia 03/20/2018   DDD (degenerative disc disease), lumbar 11/13/2017   Femoroacetabular impingement of both hips 11/13/2017   Late effect of cerebrovascular accident (CVA) 10/25/2017   H/O: CVA (cerebrovascular accident) 10/22/2017   Tobacco use 10/22/2017   Depression 12/25/2016   Abnormal drug screen 11/02/2016   Pain management contract broken 03/09/2016   Coronary artery calcification 01/18/2016   Thoracic aortic atherosclerosis ([place]) 01/18/2016   Chronic back pain 08/03/2015   Obesity (BMI 30-39.9) 03/09/2015   Erectile dysfunction 08/24/2014   Vitamin D deficiency 04/18/2014   Hyperlipidemia 04/18/2014   COPD (chronic obstructive pulmonary disease) ([place]) 04/17/2014   Chronic pain syndrome 08/19/2012   Adjustment disorder with mixed anxiety and depressed mood 05/11/2012   Trigeminal neuralgia 05/11/2012   HTN (hypertension) 05/11/2012    Conditions to be addressed/monitored:HTN, DMII, and trigeminal neuralgia  Care Plan : [facility] Care Plan  Updates made by Matthew China, RN since 03/25/2021 12:00 AM     Problem: Chronic Disease Management Needs   Priority: High     Long-Range Goal: Work with Matthew [name] Regarding Care Coordination and Care Management Associated with HTN, DM, latent effects of CVA, and Trigeminal Neuralgia   Start Date: 10/07/2020  Expected End Date: 10/07/2021  Recent Progress: On track  Priority: High  Note:   Current Barriers:  Chronic Disease Management support and education needs related to HTN, DMII, and chronic pain due to trigeminal  neuralgia  RNCM Clinical Goal(s):  Patient will take all medications exactly as prescribed and will call provider for medication related questions continue to work with RN Care Manager to address care management and care coordination needs related to HTN, DMII, and trigeminal neuarlgia  through collaboration with RN Care manager, provider, and care team.  Patient will attend all scheduled medical appointments and procedure appointments  Interventions: 1:1 collaboration with primary care provider regarding development and update of comprehensive plan of care as evidenced by provider attestation and co-signature Inter-disciplinary care team collaboration (see longitudinal plan of care) Evaluation of current treatment plan related to self management and patient's adherence to plan as established by provider Provided with [facility] telephone number and mailed business card to home address. Encouraged patient to reach out as needed.    SDOH Barriers (Status: Goal Met.) Short-Term Goal Patient interviewed and SDOH assessment performed Discussed transportation barriers and family/social support Discussed that patient does have a ride to his upcoming appointments. His son is going to assist with this Provided information on transportation assistance and encouraged patient to reach out to CCM team if assistance is needed Collaborated  with LCSW    Diabetes:  (Status: Condition stable. Not addressed this visit.) Long-Term Goal Lab Results  Component Value Date   HGBA1C 5.4 01/04/2021   HGBA1C 5.7 09/28/2020   HGBA1C 5.6 05/19/2020   Lab Results  Component Value Date   LDLCALC 46 02/26/2019   CREATININE 0.72 (L) 01/04/2021  Assessed patient's understanding of A1c goal: <7% Reviewed medications with patient and discussed importance of medication adherence;        Reviewed prescribed diet with patient ADA/Carb Modified ; Counseled on importance of regular laboratory monitoring as prescribed;         Discussed plans with patient for ongoing care management follow up and provided patient with direct contact information for care management team;      Advised patient, providing education and rationale, to check cbg once daily and when you have symptoms of low or high blood sugar and record        Review of patient status, including review of consultants reports, relevant laboratory and other test results, and medications completed;       Assessed social determinant of health barriers;          Hypertension: (Status: Condition stable. Not addressed this visit.) Long-Term Goal Last practice recorded BP readings:  BP Readings from Last 3 Encounters:  01/04/21 134/82  10/15/20 135/75  09/28/20 133/79   Evaluation of current treatment plan related to hypertension self management and patient's adherence to plan as established by provider;   Reviewed prescribed diet low sodium/DASH diet Reviewed medications with patient and discussed importance of compliance;  Counseled on the importance of exercise goals with target of 150 minutes per week Discussed plans with patient for ongoing care management follow up and provided patient with direct contact information for care management team; Advised patient, providing education and rationale, to monitor blood pressure daily and record, calling PCP for findings outside established parameters;    Pain:  (Status: Goal on track: YES.) Long-Term Goal Pain assessment performed Medications reviewed and discussed with patient. Discussed importance of adherence to all scheduled medical appointments; Counseled on the importance of reporting any/all new or changed pain symptoms or management strategies to pain management provider; Advised patient to report to care team affect of pain on daily activities; Reviewed and discussed recent gamma knife procedure for trigeminal neuralgia Discussed that pain relief can be expected 6 weeks to 15 months after procedure.  Patient is one week out and can tell that pain is improving. His speech is subjectively and objectively improved. No change in eating habits as of yet. Still having to cut food up into small pieces and has pain with eating.  Reviewed recommendation to follow up with neurologist in 3 months Discussed directions to taper off of pain medication after being pain free for 1 week Therapeutic listening utilized and patient is very hopeful that this will resolve his chronic pain. He is very appreciative that he can already feel some improvement.  Discussed family/social support Reviewed upcoming appointment with PCP in 2 weeks for routine follow-up Encouraged to reach out to neurologist with any new or worsening symptoms  Patient Goals/Self-Care Activities: Patient will self administer medications as prescribed Patient will attend all scheduled provider appointments Patient will call pharmacy for medication refills Patient will continue to perform ADL's independently Patient will continue to perform IADL's independently Patient will call provider office for new concerns or questions Patient will call [place] as needed [phone] Patient will follow a carb modified ADA diet  and limit sodium intake Patient will check blood sugar daily and as needed and will call PCP with any readings outside of recommended range Patient will check and record blood pressure times per week and will call PCP with any readings outside of recommended range  Plan:Telephone follow up appointment with care management team member scheduled for:  04/08/21 with RNCM The patient has been provided with contact information for the care management team and has been advised to call with any health related questions or concerns.   Chong Sicilian, BSN, RN-BC Embedded Chronic Care Manager [facility] [place] Management Direct Dial: [phone]

## 2021-03-25 NOTE — Patient Instructions (Signed)
Visit Information  Patient Goals/Self-Care Activities: Patient will self administer medications as prescribed Patient will attend all scheduled provider appointments Patient will call pharmacy for medication refills Patient will continue to perform ADL's independently Patient will continue to perform IADL's independently Patient will call provider office for new concerns or questions Patient will call [place] as needed [phone] Patient will follow a carb modified ADA diet and limit sodium intake Patient will check blood sugar daily and as needed and will call PCP with any readings outside of recommended range Patient will check and record blood pressure times per week and will call PCP with any readings outside of recommended range  Patient verbalizes understanding of instructions and care plan provided today and agrees to view in [place]. Active MyChart status confirmed with patient.    Plan:Telephone follow up appointment with care management team member scheduled for:  04/08/21 with RNCM The patient has been provided with contact information for the care management team and has been advised to call with any health related questions or concerns.   Chong Sicilian, BSN, RN-BC Embedded Chronic Care Manager [facility] / [place] Management Direct Dial: [phone]

## 2021-03-30 ENCOUNTER — Other Ambulatory Visit: Payer: Self-pay | Admitting: Family

## 2021-03-30 DIAGNOSIS — G500 Trigeminal neuralgia: Secondary | ICD-10-CM

## 2021-04-05 DIAGNOSIS — Z289 Immunization not carried out for unspecified reason: Secondary | ICD-10-CM | POA: Diagnosis not present

## 2021-04-05 DIAGNOSIS — Z8673 Personal history of transient ischemic attack (TIA), and cerebral infarction without residual deficits: Secondary | ICD-10-CM | POA: Diagnosis not present

## 2021-04-05 DIAGNOSIS — R6884 Jaw pain: Secondary | ICD-10-CM | POA: Diagnosis not present

## 2021-04-05 DIAGNOSIS — S02652A Fracture of angle of left mandible, initial encounter for closed fracture: Secondary | ICD-10-CM | POA: Diagnosis not present

## 2021-04-05 DIAGNOSIS — Z720 Tobacco use: Secondary | ICD-10-CM | POA: Diagnosis not present

## 2021-04-05 DIAGNOSIS — S50812A Abrasion of left forearm, initial encounter: Secondary | ICD-10-CM | POA: Diagnosis not present

## 2021-04-05 DIAGNOSIS — S02641A Fracture of ramus of right mandible, initial encounter for closed fracture: Secondary | ICD-10-CM | POA: Diagnosis not present

## 2021-04-05 DIAGNOSIS — Y9389 Activity, other specified: Secondary | ICD-10-CM | POA: Diagnosis not present

## 2021-04-05 DIAGNOSIS — Y92828 Other wilderness area as the place of occurrence of the external cause: Secondary | ICD-10-CM | POA: Diagnosis not present

## 2021-04-05 DIAGNOSIS — E785 Hyperlipidemia, unspecified: Secondary | ICD-10-CM | POA: Diagnosis not present

## 2021-04-05 DIAGNOSIS — S80212A Abrasion, left knee, initial encounter: Secondary | ICD-10-CM | POA: Diagnosis not present

## 2021-04-05 DIAGNOSIS — Z28310 Unvaccinated for covid-19: Secondary | ICD-10-CM | POA: Diagnosis not present

## 2021-04-05 DIAGNOSIS — I10 Essential (primary) hypertension: Secondary | ICD-10-CM | POA: Diagnosis not present

## 2021-04-05 DIAGNOSIS — E119 Type 2 diabetes mellitus without complications: Secondary | ICD-10-CM | POA: Diagnosis not present

## 2021-04-05 DIAGNOSIS — S50312A Abrasion of left elbow, initial encounter: Secondary | ICD-10-CM | POA: Diagnosis not present

## 2021-04-06 ENCOUNTER — Telehealth: Payer: Self-pay | Admitting: Family

## 2021-04-06 NOTE — Telephone Encounter (Signed)
Called and spoke with son he is going to call father back and se if he wants to be seen soon
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
## 2021-04-07 ENCOUNTER — Ambulatory Visit (INDEPENDENT_AMBULATORY_CARE_PROVIDER_SITE_OTHER): Payer: Medicare Other | Admitting: Family

## 2021-04-07 ENCOUNTER — Encounter: Payer: Self-pay | Admitting: Family

## 2021-04-07 DIAGNOSIS — S0990XD Unspecified injury of head, subsequent encounter: Secondary | ICD-10-CM

## 2021-04-07 DIAGNOSIS — S02652D Fracture of angle of left mandible, subsequent encounter for fracture with routine healing: Secondary | ICD-10-CM

## 2021-04-07 DIAGNOSIS — Z09 Encounter for follow-up examination after completed treatment for conditions other than malignant neoplasm: Secondary | ICD-10-CM

## 2021-04-07 DIAGNOSIS — S02641D Fracture of ramus of right mandible, subsequent encounter for fracture with routine healing: Secondary | ICD-10-CM

## 2021-04-07 MED ORDER — HYDROCODONE-ACETAMINOPHEN 7.5-325 MG PO TABS: 1.0000 | ORAL_TABLET | Freq: Four times a day (QID) | ORAL | 0 refills | Status: DC | PRN

## 2021-04-07 NOTE — Patient Instructions (Signed)
Jaw Fracture Eating Plan A break (fracture) of the jaw bone often needs surgery for treatment. After surgery, you will need to eat foods that can be blended so that they can be sipped from a straw or given through a syringe. Work with a diet and nutrition specialist (dietitian) to create an eating plan that helps you get the nutrients you need in order to heal and stay healthy. What are tips for following this plan? General guidelines All foods in this plan must be blended. Avoid nuts, seeds, skins, peels, bones, or any foods that cannot be blended to the right consistency. Ask your health care provider about taking a liquid multivitamin to make sure that you get all the vitamins and minerals you need. Cooking  Before blending, remove any skins, seeds, or peels from food. Cook meats and vegetables until tender. Cut foods into small pieces and mix with a small amount of liquid in a food processor or blender. Continue to add liquid until the food becomes thin enough to sip through a straw. Add liquids such as juice, milk, cream, broth, gravy, or vegetable juice to help add flavor to foods. Heat foods after they have been blended, not before. This reduces the amount of foam created from blending. If you need to increase calories in food: Add protein powder or powdered milk to foods. Cook with fats, such as margarine (without trans fat), sour cream, cream cheese, cream, or nut butters. Prepare foods with sweeteners, such as honey, ice cream, blackstrap molasses, or sugar. Meal planning Eat at least three meals and three snacks daily. It is important to make sure that you get enough calories and protein to prevent weight loss and help your body heal, especially after surgery. Eat a variety of foods from each food group every day, including fruits and vegetables, protein, whole grains, dairy, and healthy fats. If your teeth and mouth are sensitive to extreme temperatures, heat or cool your foods to  lukewarm temperatures. What foods are recommended? The items listed may not be a complete list. Talk with your dietitian about what dietary choices are best for you. Grains Hot cereals, such as oatmeal, grits, ground wheat cereals, and polenta. Rice and pasta. Couscous. Vegetables All cooked or canned vegetables, without seeds and skins. Vegetable juices. Cooked potatoes, without skins. Fruits Any cooked or canned fruits, without seeds and skins. Fresh, peeled soft fruits, such as bananas and peaches, that can be blended until smooth. All fruit juices, without seeds and skins. Meat and other protein foods Soft-boiled eggs, scrambled eggs, powdered eggs, pasteurized egg mixtures, and custard. Ground meats, such as hamburger, [place], sausage, and meatloaf. Tender, well-cooked meat, poultry, and fish, prepared without bones or skin. Soft soy foods, such as tofu. Smooth nut butters. Liquid egg substitutes. Dairy Milk. Cheese. Yogurt. Cottage cheese. Pudding. Beverages Coffee (regular or decaffeinated), tea, and mineral water. Liquid supplements that have protein and calories. Fats and oils Any oils. Melted margarine or butter. Ghee. Sour cream. Cream cheese. Avocado. Seasoning and other foods All seasonings and condiments that blend well. Ground spices. Finely ground seeds and nuts. Mustard or any smooth condiment. Summary Foods in this plan need to be prepared so that they can be sipped from a straw or given through a syringe. Try to have at least three meals and three snacks daily. Avoid nuts, seeds, skins, peels, bones, or any foods that cannot be blended to the right consistency. Make sure you eat a variety of foods from each food group every  day. Include a liquid multivitamin in your plan as told by your health care provider or dietitian. This information is not intended to replace advice given to you by your health care provider. Make sure you discuss any questions you have with your health  care provider. Document Revised: 12/11/2019 Document Reviewed: 12/11/2019 Elsevier Patient Education  2022 [employer].
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
## 2021-04-08 ENCOUNTER — Encounter: Payer: Self-pay | Admitting: *Deleted

## 2021-04-08 ENCOUNTER — Ambulatory Visit: Payer: Medicare Other | Admitting: *Deleted

## 2021-04-08 DIAGNOSIS — I10 Essential (primary) hypertension: Secondary | ICD-10-CM

## 2021-04-08 DIAGNOSIS — E1142 Type 2 diabetes mellitus with diabetic polyneuropathy: Secondary | ICD-10-CM

## 2021-04-08 DIAGNOSIS — S02652D Fracture of angle of left mandible, subsequent encounter for fracture with routine healing: Secondary | ICD-10-CM

## 2021-04-08 DIAGNOSIS — S02641D Fracture of ramus of right mandible, subsequent encounter for fracture with routine healing: Secondary | ICD-10-CM
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
## 2021-04-11 ENCOUNTER — Other Ambulatory Visit: Payer: Self-pay | Admitting: Family

## 2021-04-11 ENCOUNTER — Other Ambulatory Visit: Payer: Self-pay | Admitting: Neurology

## 2021-04-11 DIAGNOSIS — I4891 Unspecified atrial fibrillation: Secondary | ICD-10-CM

## 2021-04-11 DIAGNOSIS — I10 Essential (primary) hypertension: Secondary | ICD-10-CM

## 2021-04-12 DIAGNOSIS — I10 Essential (primary) hypertension: Secondary | ICD-10-CM | POA: Diagnosis not present

## 2021-04-12 DIAGNOSIS — S0993XA Unspecified injury of face, initial encounter: Secondary | ICD-10-CM | POA: Diagnosis not present

## 2021-04-12 DIAGNOSIS — E1142 Type 2 diabetes mellitus with diabetic polyneuropathy: Secondary | ICD-10-CM

## 2021-04-14 ENCOUNTER — Encounter: Payer: Self-pay | Admitting: Family

## 2021-04-14 ENCOUNTER — Ambulatory Visit (INDEPENDENT_AMBULATORY_CARE_PROVIDER_SITE_OTHER): Payer: Medicare Other | Admitting: Family

## 2021-04-14 VITALS — BP 152/92 | HR 65 | Temp 97.8°F | Ht 71.0 in | Wt 211.8 lb

## 2021-04-14 DIAGNOSIS — S02652D Fracture of angle of left mandible, subsequent encounter for fracture with routine healing: Secondary | ICD-10-CM | POA: Diagnosis not present

## 2021-04-14 DIAGNOSIS — S02652S Fracture of angle of left mandible, sequela: Secondary | ICD-10-CM | POA: Diagnosis not present

## 2021-04-14 DIAGNOSIS — I10 Essential (primary) hypertension: Secondary | ICD-10-CM

## 2021-04-14 DIAGNOSIS — I6930 Unspecified sequelae of cerebral infarction: Secondary | ICD-10-CM | POA: Diagnosis not present

## 2021-04-14 DIAGNOSIS — J438 Other emphysema: Secondary | ICD-10-CM | POA: Diagnosis not present

## 2021-04-14 DIAGNOSIS — S0993XS Unspecified injury of face, sequela: Secondary | ICD-10-CM

## 2021-04-14 DIAGNOSIS — S02652A Fracture of angle of left mandible, initial encounter for closed fracture: Secondary | ICD-10-CM | POA: Diagnosis not present

## 2021-04-14 DIAGNOSIS — S0990XD Unspecified injury of head, subsequent encounter: Secondary | ICD-10-CM | POA: Diagnosis not present

## 2021-04-14 DIAGNOSIS — Z8673 Personal history of transient ischemic attack (TIA), and cerebral infarction without residual deficits: Secondary | ICD-10-CM | POA: Diagnosis not present

## 2021-04-14 DIAGNOSIS — S0993XA Unspecified injury of face, initial encounter: Secondary | ICD-10-CM

## 2021-04-14 DIAGNOSIS — S02641A Fracture of ramus of right mandible, initial encounter for closed fracture: Secondary | ICD-10-CM

## 2021-04-14 DIAGNOSIS — Z09 Encounter for follow-up examination after completed treatment for conditions other than malignant neoplasm: Secondary | ICD-10-CM

## 2021-04-14 DIAGNOSIS — S02641D Fracture of ramus of right mandible, subsequent encounter for fracture with routine healing: Secondary | ICD-10-CM | POA: Diagnosis not present

## 2021-04-14 MED ORDER — CLINDAMYCIN HCL 300 MG PO CAPS: 300.0000 mg | ORAL_CAPSULE | Freq: Three times a day (TID) | ORAL | 0 refills | Status: AC

## 2021-04-14 MED ORDER — HYDROCODONE-ACETAMINOPHEN 7.5-325 MG PO TABS: 1.0000 | ORAL_TABLET | Freq: Four times a day (QID) | ORAL | 0 refills | Status: DC | PRN
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
## 2021-04-15 ENCOUNTER — Ambulatory Visit (INDEPENDENT_AMBULATORY_CARE_PROVIDER_SITE_OTHER): Payer: Medicare Other | Admitting: *Deleted

## 2021-04-15 DIAGNOSIS — S0993XS Unspecified injury of face, sequela: Secondary | ICD-10-CM

## 2021-04-15 DIAGNOSIS — E1142 Type 2 diabetes mellitus with diabetic polyneuropathy: Secondary | ICD-10-CM

## 2021-04-15 DIAGNOSIS — G500 Trigeminal neuralgia: Secondary | ICD-10-CM

## 2021-04-15 DIAGNOSIS — I10 Essential (primary) hypertension: Secondary | ICD-10-CM

## 2021-04-15 LAB — CMP14+EGFR
ALT: 11 IU/L (ref 0–44)
AST: 13 IU/L (ref 0–40)
Albumin/Globulin Ratio: 1.5 (ref 1.2–2.2)
Albumin: 4.4 g/dL (ref 3.8–4.8)
Alkaline Phosphatase: 106 IU/L (ref 44–121)
BUN/Creatinine Ratio: 9 — ABNORMAL LOW (ref 10–24)
BUN: 6 mg/dL — ABNORMAL LOW (ref 8–27)
Bilirubin Total: 0.3 mg/dL (ref 0.0–1.2)
CO2: 24 mmol/L (ref 20–29)
Calcium: 9.1 mg/dL (ref 8.6–10.2)
Chloride: 102 mmol/L (ref 96–106)
Creatinine, Ser: 0.66 mg/dL — ABNORMAL LOW (ref 0.76–1.27)
Globulin, Total: 2.9 g/dL (ref 1.5–4.5)
Glucose: 103 mg/dL — ABNORMAL HIGH (ref 70–99)
Potassium: 4.6 mmol/L (ref 3.5–5.2)
Sodium: 139 mmol/L (ref 134–144)
Total Protein: 7.3 g/dL (ref 6.0–8.5)
eGFR: 107 mL/min/{1.73_m2} (ref >59)

## 2021-04-15 LAB — CBC WITH DIFFERENTIAL/PLATELET
Basophils Absolute: 0.1 10*3/uL (ref 0.0–0.2)
Basos: 1 %
EOS (ABSOLUTE): 0.2 10*3/uL (ref 0.0–0.4)
Eos: 2 %
Hematocrit: 42.2 % (ref 37.5–51.0)
Hemoglobin: 14.4 g/dL (ref 13.0–17.7)
Immature Grans (Abs): 0 10*3/uL (ref 0.0–0.1)
Immature Granulocytes: 0 %
Lymphocytes Absolute: 2.5 10*3/uL (ref 0.7–3.1)
Lymphs: 25 %
MCH: 29.8 pg (ref 26.6–33.0)
MCHC: 34.1 g/dL (ref 31.5–35.7)
MCV: 87 fL (ref 79–97)
Monocytes Absolute: 0.7 10*3/uL (ref 0.1–0.9)
Monocytes: 7 %
Neutrophils Absolute: 6.5 10*3/uL (ref 1.4–7.0)
Neutrophils: 65 %
Platelets: 265 10*3/uL (ref 150–450)
RBC: 4.83 x10E6/uL (ref 4.14–5.80)
RDW: 11.8 % (ref 11.6–15.4)
WBC: 9.9 10*3/uL (ref 3.4–10.8)
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
## 2021-04-16 ENCOUNTER — Other Ambulatory Visit: Payer: Self-pay | Admitting: Family

## 2021-04-16 DIAGNOSIS — J441 Chronic obstructive pulmonary disease with (acute) exacerbation: Secondary | ICD-10-CM

## 2021-04-24 DIAGNOSIS — E119 Type 2 diabetes mellitus without complications: Secondary | ICD-10-CM | POA: Diagnosis not present

## 2021-04-24 DIAGNOSIS — R220 Localized swelling, mass and lump, head: Secondary | ICD-10-CM | POA: Diagnosis not present

## 2021-04-24 DIAGNOSIS — F17210 Nicotine dependence, cigarettes, uncomplicated: Secondary | ICD-10-CM | POA: Diagnosis not present

## 2021-04-24 DIAGNOSIS — S02601A Fracture of unspecified part of body of right mandible, initial encounter for closed fracture: Secondary | ICD-10-CM | POA: Diagnosis not present

## 2021-04-24 DIAGNOSIS — Y929 Unspecified place or not applicable: Secondary | ICD-10-CM | POA: Diagnosis not present

## 2021-04-24 DIAGNOSIS — E785 Hyperlipidemia, unspecified: Secondary | ICD-10-CM | POA: Diagnosis not present

## 2021-04-24 DIAGNOSIS — Y939 Activity, unspecified: Secondary | ICD-10-CM | POA: Diagnosis not present

## 2021-04-24 DIAGNOSIS — K122 Cellulitis and abscess of mouth: Secondary | ICD-10-CM | POA: Diagnosis not present

## 2021-04-24 DIAGNOSIS — S02602A Fracture of unspecified part of body of left mandible, initial encounter for closed fracture: Secondary | ICD-10-CM | POA: Diagnosis not present

## 2021-04-24 DIAGNOSIS — J449 Chronic obstructive pulmonary disease, unspecified: Secondary | ICD-10-CM | POA: Diagnosis not present

## 2021-04-24 DIAGNOSIS — Z8673 Personal history of transient ischemic attack (TIA), and cerebral infarction without residual deficits: Secondary | ICD-10-CM | POA: Diagnosis not present

## 2021-04-24 DIAGNOSIS — S02641A Fracture of ramus of right mandible, initial encounter for closed fracture: Secondary | ICD-10-CM | POA: Diagnosis not present

## 2021-04-24 DIAGNOSIS — I10 Essential (primary) hypertension: Secondary | ICD-10-CM | POA: Diagnosis not present

## 2021-04-29 ENCOUNTER — Telehealth: Payer: Medicare Other | Admitting: *Deleted

## 2021-05-02 ENCOUNTER — Telehealth: Payer: Medicare Other

## 2021-05-05 ENCOUNTER — Ambulatory Visit (INDEPENDENT_AMBULATORY_CARE_PROVIDER_SITE_OTHER): Payer: Medicare Other | Admitting: Family

## 2021-05-05 ENCOUNTER — Encounter: Payer: Self-pay | Admitting: Family

## 2021-05-05 VITALS — BP 142/78 | HR 53 | Temp 97.5°F | Ht 71.0 in | Wt 209.8 lb

## 2021-05-05 DIAGNOSIS — J438 Other emphysema: Secondary | ICD-10-CM | POA: Diagnosis not present

## 2021-05-05 DIAGNOSIS — S0990XD Unspecified injury of head, subsequent encounter: Secondary | ICD-10-CM

## 2021-05-05 DIAGNOSIS — E1142 Type 2 diabetes mellitus with diabetic polyneuropathy: Secondary | ICD-10-CM

## 2021-05-05 DIAGNOSIS — S0993XD Unspecified injury of face, subsequent encounter: Secondary | ICD-10-CM | POA: Diagnosis not present

## 2021-05-05 DIAGNOSIS — Z09 Encounter for follow-up examination after completed treatment for conditions other than malignant neoplasm: Secondary | ICD-10-CM | POA: Diagnosis not present

## 2021-05-05 DIAGNOSIS — S02652D Fracture of angle of left mandible, subsequent encounter for fracture with routine healing: Secondary | ICD-10-CM

## 2021-05-05 DIAGNOSIS — S02641D Fracture of ramus of right mandible, subsequent encounter for fracture with routine healing: Secondary | ICD-10-CM

## 2021-05-05 DIAGNOSIS — Z720 Tobacco use: Secondary | ICD-10-CM | POA: Diagnosis not present

## 2021-05-05 DIAGNOSIS — I10 Essential (primary) hypertension: Secondary | ICD-10-CM

## 2021-05-05 DIAGNOSIS — S02652S Fracture of angle of left mandible, sequela: Secondary | ICD-10-CM

## 2021-05-05 DIAGNOSIS — S0993XS Unspecified injury of face, sequela: Secondary | ICD-10-CM

## 2021-05-05 MED ORDER — HYDROCODONE-ACETAMINOPHEN 7.5-325 MG PO TABS: 1.0000 | ORAL_TABLET | Freq: Four times a day (QID) | ORAL | 0 refills | Status: DC | PRN
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
## 2021-05-06 ENCOUNTER — Other Ambulatory Visit: Payer: Self-pay | Admitting: Family

## 2021-05-06 DIAGNOSIS — G500 Trigeminal neuralgia: Secondary | ICD-10-CM

## 2021-05-10 ENCOUNTER — Telehealth: Payer: Self-pay | Admitting: Family

## 2021-05-10 MED ORDER — TRELEGY ELLIPTA 100-62.5-25 MCG/ACT IN AEPB: 1.0000 | INHALATION_SPRAY | Freq: Every day | RESPIRATORY_TRACT | 11 refills | Status: DC

## 2021-05-10 NOTE — Telephone Encounter (Signed)
?  Prescription Request ? ?05/10/2021 ? ?Is this a "Controlled Substance" medicine? NO ? ?Have you seen your PCP in the last 2 weeks? NO ? ?If YES, route message to pool  -  If NO, patient needs to be scheduled for appointment. ? ?What is the name of the medication or equipment? Fluticasone 100-62.5-25 MCG. Patient has been using two times a day and is out ? ?Have you contacted your pharmacy to request a refill? YES  ? ?Which pharmacy would you like this sent to? [facility] ? ? ?Patient notified that their request is being sent to the clinical staff for review and that they should receive a response within 2 business days.  ?  ?

## 2021-05-10 NOTE — Telephone Encounter (Signed)
Please advise, pt request RF on Trelegy, has been using BID and is out ?Send to [place] ?

## 2021-05-13 DIAGNOSIS — E1142 Type 2 diabetes mellitus with diabetic polyneuropathy: Secondary | ICD-10-CM

## 2021-05-13 DIAGNOSIS — I10 Essential (primary) hypertension: Secondary | ICD-10-CM

## 2021-06-06 ENCOUNTER — Encounter: Payer: Self-pay | Admitting: Family

## 2021-06-06 ENCOUNTER — Ambulatory Visit (INDEPENDENT_AMBULATORY_CARE_PROVIDER_SITE_OTHER): Payer: Medicare Other | Admitting: Family

## 2021-06-06 VITALS — BP 133/68 | HR 58 | Temp 99.3°F | Ht 71.0 in | Wt 217.8 lb

## 2021-06-06 DIAGNOSIS — S0990XD Unspecified injury of head, subsequent encounter: Secondary | ICD-10-CM

## 2021-06-06 DIAGNOSIS — I2510 Atherosclerotic heart disease of native coronary artery without angina pectoris: Secondary | ICD-10-CM

## 2021-06-06 DIAGNOSIS — I10 Essential (primary) hypertension: Secondary | ICD-10-CM

## 2021-06-06 DIAGNOSIS — E785 Hyperlipidemia, unspecified: Secondary | ICD-10-CM | POA: Diagnosis not present

## 2021-06-06 DIAGNOSIS — I4891 Unspecified atrial fibrillation: Secondary | ICD-10-CM | POA: Diagnosis not present

## 2021-06-06 DIAGNOSIS — S02652D Fracture of angle of left mandible, subsequent encounter for fracture with routine healing: Secondary | ICD-10-CM

## 2021-06-06 DIAGNOSIS — S02641D Fracture of ramus of right mandible, subsequent encounter for fracture with routine healing: Secondary | ICD-10-CM | POA: Diagnosis not present

## 2021-06-06 DIAGNOSIS — Z8673 Personal history of transient ischemic attack (TIA), and cerebral infarction without residual deficits: Secondary | ICD-10-CM | POA: Diagnosis not present

## 2021-06-06 DIAGNOSIS — E1142 Type 2 diabetes mellitus with diabetic polyneuropathy: Secondary | ICD-10-CM | POA: Diagnosis not present

## 2021-06-06 DIAGNOSIS — I2584 Coronary atherosclerosis due to calcified coronary lesion: Secondary | ICD-10-CM

## 2021-06-06 DIAGNOSIS — F331 Major depressive disorder, recurrent, moderate: Secondary | ICD-10-CM

## 2021-06-06 DIAGNOSIS — J438 Other emphysema: Secondary | ICD-10-CM

## 2021-06-06 DIAGNOSIS — M549 Dorsalgia, unspecified: Secondary | ICD-10-CM | POA: Diagnosis not present

## 2021-06-06 DIAGNOSIS — E669 Obesity, unspecified: Secondary | ICD-10-CM

## 2021-06-06 DIAGNOSIS — Z720 Tobacco use: Secondary | ICD-10-CM

## 2021-06-06 DIAGNOSIS — G8929 Other chronic pain: Secondary | ICD-10-CM

## 2021-06-06 LAB — BAYER DCA HB A1C WAIVED: HB A1C (BAYER DCA - WAIVED): 5.3 % (ref 4.8–5.6)

## 2021-06-06 MED ORDER — HYDROCODONE-ACETAMINOPHEN 7.5-325 MG PO TABS: 1.0000 | ORAL_TABLET | Freq: Four times a day (QID) | ORAL | 0 refills | Status: DC | PRN

## 2021-06-06 MED ORDER — METHYLPREDNISOLONE ACETATE 80 MG/ML IJ SUSP
80.0000 mg | Freq: Once | INTRAMUSCULAR | Status: AC
  Administered 2021-06-06: 80 mg via INTRAMUSCULAR

## 2021-06-06 NOTE — Patient Instructions (Signed)
Health Maintenance, Male Adopting a healthy lifestyle and getting preventive care are important in promoting health and wellness. Ask your health care provider about: The right schedule for you to have regular tests and exams. Things you can do on your own to prevent diseases and keep yourself healthy. What should I know about diet, weight, and exercise? Eat a healthy diet  Eat a diet that includes plenty of vegetables, fruits, low-fat dairy products, and lean protein. Do not eat a lot of foods that are high in solid fats, added sugars, or sodium. Maintain a healthy weight Body mass index (BMI) is a measurement that can be used to identify possible weight problems. It estimates body fat based on height and weight. Your health care provider can help determine your BMI and help you achieve or maintain a healthy weight. Get regular exercise Get regular exercise. This is one of the most important things you can do for your health. Most adults should: Exercise for at least 150 minutes each week. The exercise should increase your heart rate and make you sweat (moderate-intensity exercise). Do strengthening exercises at least twice a week. This is in addition to the moderate-intensity exercise. Spend less time sitting. Even light physical activity can be beneficial. Watch cholesterol and blood lipids Have your blood tested for lipids and cholesterol at 62 years of age, then have this test every 5 years. You may need to have your cholesterol levels checked more often if: Your lipid or cholesterol levels are high. You are older than 62 years of age. You are at high risk for heart disease. What should I know about cancer screening? Many types of cancers can be detected early and may often be prevented. Depending on your health history and family history, you may need to have cancer screening at various ages. This may include screening for: Colorectal cancer. Prostate cancer. Skin cancer. Lung  cancer. What should I know about heart disease, diabetes, and high blood pressure? Blood pressure and heart disease High blood pressure causes heart disease and increases the risk of stroke. This is more likely to develop in people who have high blood pressure readings or are overweight. Talk with your health care provider about your target blood pressure readings. Have your blood pressure checked: Every 3-5 years if you are 18-39 years of age. Every year if you are 40 years old or older. If you are between the ages of 65 and 75 and are a current or former smoker, ask your health care provider if you should have a one-time screening for abdominal aortic aneurysm (AAA). Diabetes Have regular diabetes screenings. This checks your fasting blood sugar level. Have the screening done: Once every three years after age 45 if you are at a normal weight and have a low risk for diabetes. More often and at a younger age if you are overweight or have a high risk for diabetes. What should I know about preventing infection? Hepatitis B If you have a higher risk for hepatitis B, you should be screened for this virus. Talk with your health care provider to find out if you are at risk for hepatitis B infection. Hepatitis C Blood testing is recommended for: Everyone born from 1945 through 1965. Anyone with known risk factors for hepatitis C. Sexually transmitted infections (STIs) You should be screened each year for STIs, including gonorrhea and chlamydia, if: You are sexually active and are younger than 62 years of age. You are older than 62 years of age and your   health care provider tells you that you are at risk for this type of infection. Your sexual activity has changed since you were last screened, and you are at increased risk for chlamydia or gonorrhea. Ask your health care provider if you are at risk. Ask your health care provider about whether you are at high risk for HIV. Your health care provider  may recommend a prescription medicine to help prevent HIV infection. If you choose to take medicine to prevent HIV, you should first get tested for HIV. You should then be tested every 3 months for as long as you are taking the medicine. Follow these instructions at home: Alcohol use Do not drink alcohol if your health care provider tells you not to drink. If you drink alcohol: Limit how much you have to 0-2 drinks a day. Know how much alcohol is in your drink. In the [place] one drink equals one 12 oz bottle of beer (355 mL), one 5 oz glass of wine (148 mL), or one 1 oz glass of hard liquor (44 mL). Lifestyle Do not use any products that contain nicotine or tobacco. These products include cigarettes, chewing tobacco, and vaping devices, such as e-cigarettes. If you need help quitting, ask your health care provider. Do not use street drugs. Do not share needles. Ask your health care provider for help if you need support or information about quitting drugs. General instructions Schedule regular health, dental, and eye exams. Stay current with your vaccines. Tell your health care provider if: You often feel depressed. You have ever been abused or do not feel safe at home. Summary Adopting a healthy lifestyle and getting preventive care are important in promoting health and wellness. Follow your health care provider's instructions about healthy diet, exercising, and getting tested or screened for diseases. Follow your health care provider's instructions on monitoring your cholesterol and blood pressure. This information is not intended to replace advice given to you by your health care provider. Make sure you discuss any questions you have with your health care provider. Document Revised: 06/21/2020 Document Reviewed: 06/21/2020 Elsevier Patient Education  2023 [employer]vier Inc.  
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
## 2021-06-07 LAB — CMP14+EGFR
ALT: 14 IU/L (ref 0–44)
AST: 15 IU/L (ref 0–40)
Albumin/Globulin Ratio: 1.7 (ref 1.2–2.2)
Albumin: 4.2 g/dL (ref 3.8–4.8)
Alkaline Phosphatase: 109 IU/L (ref 44–121)
BUN/Creatinine Ratio: 12 (ref 10–24)
BUN: 8 mg/dL (ref 8–27)
Bilirubin Total: 0.3 mg/dL (ref 0.0–1.2)
CO2: 22 mmol/L (ref 20–29)
Calcium: 8.9 mg/dL (ref 8.6–10.2)
Chloride: 105 mmol/L (ref 96–106)
Creatinine, Ser: 0.68 mg/dL — ABNORMAL LOW (ref 0.76–1.27)
Globulin, Total: 2.5 g/dL (ref 1.5–4.5)
Glucose: 93 mg/dL (ref 70–99)
Potassium: 4.3 mmol/L (ref 3.5–5.2)
Sodium: 141 mmol/L (ref 134–144)
Total Protein: 6.7 g/dL (ref 6.0–8.5)
eGFR: 106 mL/min/{1.73_m2} (ref >59)

## 2021-06-07 LAB — MICROALBUMIN / CREATININE URINE RATIO
Creatinine, Urine: 8 mg/dL
Microalbumin, Urine: <3 ug/mL

## 2021-06-19 ENCOUNTER — Other Ambulatory Visit: Payer: Self-pay | Admitting: Family

## 2021-06-19 DIAGNOSIS — G500 Trigeminal neuralgia: Secondary | ICD-10-CM

## 2021-07-04 ENCOUNTER — Other Ambulatory Visit: Payer: Self-pay | Admitting: Family

## 2021-07-04 DIAGNOSIS — E119 Type 2 diabetes mellitus without complications: Secondary | ICD-10-CM | POA: Diagnosis not present

## 2021-07-04 DIAGNOSIS — X58XXXA Exposure to other specified factors, initial encounter: Secondary | ICD-10-CM | POA: Diagnosis not present

## 2021-07-04 DIAGNOSIS — R4781 Slurred speech: Secondary | ICD-10-CM | POA: Diagnosis not present

## 2021-07-04 DIAGNOSIS — Y939 Activity, unspecified: Secondary | ICD-10-CM | POA: Diagnosis not present

## 2021-07-04 DIAGNOSIS — Y929 Unspecified place or not applicable: Secondary | ICD-10-CM | POA: Diagnosis not present

## 2021-07-04 DIAGNOSIS — I10 Essential (primary) hypertension: Secondary | ICD-10-CM | POA: Diagnosis not present

## 2021-07-04 DIAGNOSIS — J449 Chronic obstructive pulmonary disease, unspecified: Secondary | ICD-10-CM | POA: Diagnosis not present

## 2021-07-04 DIAGNOSIS — S02609D Fracture of mandible, unspecified, subsequent encounter for fracture with routine healing: Secondary | ICD-10-CM | POA: Diagnosis not present

## 2021-07-04 DIAGNOSIS — R220 Localized swelling, mass and lump, head: Secondary | ICD-10-CM | POA: Diagnosis not present

## 2021-07-04 DIAGNOSIS — Z7901 Long term (current) use of anticoagulants: Secondary | ICD-10-CM | POA: Diagnosis not present

## 2021-07-04 DIAGNOSIS — G500 Trigeminal neuralgia: Secondary | ICD-10-CM

## 2021-07-04 DIAGNOSIS — S02609A Fracture of mandible, unspecified, initial encounter for closed fracture: Secondary | ICD-10-CM | POA: Diagnosis not present

## 2021-07-04 DIAGNOSIS — Z720 Tobacco use: Secondary | ICD-10-CM | POA: Diagnosis not present

## 2021-07-04 DIAGNOSIS — Z8673 Personal history of transient ischemic attack (TIA), and cerebral infarction without residual deficits: Secondary | ICD-10-CM | POA: Diagnosis not present

## 2021-07-10 ENCOUNTER — Other Ambulatory Visit: Payer: Self-pay | Admitting: Family

## 2021-07-10 DIAGNOSIS — G500 Trigeminal neuralgia: Secondary | ICD-10-CM

## 2021-07-14 ENCOUNTER — Other Ambulatory Visit: Payer: Self-pay | Admitting: Family

## 2021-07-14 ENCOUNTER — Ambulatory Visit (INDEPENDENT_AMBULATORY_CARE_PROVIDER_SITE_OTHER): Payer: Medicare HMO | Admitting: Nurse Practitioner

## 2021-07-14 ENCOUNTER — Encounter: Payer: Self-pay | Admitting: Nurse Practitioner

## 2021-07-14 VITALS — BP 131/78 | HR 66 | Temp 98.4°F | Resp 20 | Ht 71.0 in | Wt 215.0 lb

## 2021-07-14 DIAGNOSIS — M7989 Other specified soft tissue disorders: Secondary | ICD-10-CM

## 2021-07-14 DIAGNOSIS — R6884 Jaw pain: Secondary | ICD-10-CM

## 2021-07-14 NOTE — Progress Notes (Signed)
   Subjective:    Patient ID: Matthew [name], male    DOB: [birth year], 61 y.o.   MRN: [id number]   Chief Complaint: mouht and jaw swelling (Wants prednisone to help swelling. States it was broke 6 months ago)   HPI Patient come sin today c/o jaw pain. He broke his jaw 6 months ago. He had to have surgery and have metal put in to repair. He says it has not healed yet. He says it is still hurting him. He went to [facility] on 07/04/22 and they gave him prednisone for the pain. He has taken all of them and he wants more.   He has a knot on right side of neck but he says he has already had it checked out. DR. Posey Pronto order CT soft tissue of neck, but he did not have done.   Review of Systems  Constitutional:  Negative for diaphoresis.  Eyes:  Negative for pain.  Respiratory:  Negative for shortness of breath.   Cardiovascular:  Negative for chest pain, palpitations and leg swelling.  Gastrointestinal:  Negative for abdominal pain.  Endocrine: Negative for polydipsia.  Skin:  Negative for rash.  Neurological:  Negative for dizziness, weakness and headaches.  Hematological:  Does not bruise/bleed easily.  All other systems reviewed and are negative.     Objective:   Physical Exam Constitutional:      Appearance: Normal appearance. He is obese.  HENT:     Mouth/Throat:     Comments: limited ROM of bil Jaw jpints.  Cardiovascular:     Rate and Rhythm: Normal rate and regular rhythm.     Heart sounds: Normal heart sounds.  Pulmonary:     Breath sounds: Normal breath sounds.  Lymphadenopathy:     Cervical: Cervical adenopathy (right anterior cervical chain.) present.  Skin:    General: Skin is warm and dry.  Neurological:     General: No focal deficit present.     Mental Status: He is alert and oriented to person, place, and time.  Psychiatric:        Mood and Affect: Mood normal.        Behavior: Behavior normal.    BP 131/78   Pulse 66   Temp 98.4 F (36.9 C) (Temporal)    Resp 20   Ht '5\' 11"'$  (1.803 m)   Wt 215 lb (97.5 kg)   SpO2 95%   BMI 29.99 kg/m        Assessment & Plan:   Matthew [name] in today with chief complaint of mouht and jaw swelling (Wants prednisone to help swelling. States it was broke 6 months ago)   1. Mass of soft tissue of neck Patient refuses CT and or [facility]  2. Jaw pain Tylenol OTC Avoid NSAIDS Prednisone will not help- and will actually slow down healig of jaw  Had to review recent records.   The above assessment and management plan was discussed with the patient. The patient verbalized understanding of and has agreed to the management plan. Patient is aware to call the clinic if symptoms persist or worsen. Patient is aware when to return to the clinic for a follow-up visit. Patient educated on when it is appropriate to go to the emergency department.   Mary-Margaret Hassell Done, FNP

## 2021-07-14 NOTE — Patient Instructions (Signed)
Jaw Range of Motion Exercises Jaw range of motion exercises are exercises that help your jaw move better. Exercises that help you have good posture (postural exercises) also help relieve jaw discomfort. These are often done along with range of motion exercises. These exercises can help prevent or improve: Trouble opening your mouth. Pain in your jaw while it is open or closed. Temporomandibular joint (TMJ) pain. Headache caused by jaw tension. Take other actions to prevent or relieve jaw pain, such as: Avoiding things that cause or increase jaw pain. This may include: Chewing gum or eating hard foods. Clenching your jaw or teeth, grinding your teeth, or keeping tension in your jaw muscles. Opening your mouth wide, such as for a big yawn. Leaning on your jaw, such as resting your jaw in your hand while leaning on a desk. Putting ice on your jaw. To do this: Put ice in a plastic bag. Place a towel between your skin and the bag. Leave the ice on for 20 minutes, 2-3 times a day. Remove the ice if your skin turns bright red. This is very important. If you cannot feel pain, heat, or cold, you have a greater risk of damage to the area. Only do jaw exercises that your health care provider recommends. Only move your jaw as far as it can comfortably go in each direction. Do not move your jaw into positions that cause pain. Range of motion exercises Repeat each of these exercises 8 times, 1-2 times a day, or as told by your health care provider. Exercise A: Forward protrusion  Push your jaw forward. Hold this position for 1-2 seconds. Let your jaw return to its normal position and rest it there for 1-2 seconds. Exercise B: Controlled opening  Stand or sit in front of a mirror. Place your tongue on the roof of your mouth, just behind your top teeth. Keeping your tongue on the roof of your mouth, slowly open and close your mouth. While you open and close your mouth, watch your jaw in the mirror. Try  to keep your jaw from moving to one side or the other. Exercise C: Right and left motion  Move your jaw right. Hold this position for 1-2 seconds. Let your jaw return to its normal position, and rest it there for 1-2 seconds. Move your jaw left. Hold this position for 1-2 seconds. Let your jaw return to its normal position, and rest it there for 1-2 seconds. Postural exercises Exercise A: Chin tucks  You can do this exercise sitting, standing, or lying down. Move your head straight back, keeping your head level. You can guide the movement by placing your fingers on your chin to push your jaw back in an even motion. You should be able to feel a double chin form at the end of the motion. Hold this position for 5 seconds. Repeat 10-15 times. Exercise B: Shoulder blade squeeze  Sit or stand. Bend your elbows to about 90 degrees, which is the shape of a capital letter "L." Keep your upper arms by your body. Squeeze your shoulder blades down and back, as though you were trying to touch your elbows behind you. Do not shrug your shoulders or move your head. Hold this position for 5 seconds. Repeat 10-15 times. Exercise C: Chest stretch  Stand in a doorway with one of your feet slightly in front of the other. This is called a staggered stance. If you cannot reach your forearms to the door frame, do this exercise in   a corner of a room. Put both of your hands and your forearms on the door frame, with your arms wide apart. Make sure your arms are at a 90-degree angle to your body. Place your hands on the door frame at the height of your elbows. Slowly move your weight onto your front foot until you feel a stretch across your chest and in the front of your shoulders. Keep your head and chest upright and keep your abdominal muscles tight. Do not lean in. Hold this position for 30 seconds. Repeat 3 times. Contact a health care provider if: You have jaw pain that is new or gets worse. You have clicking or  popping sounds while doing the exercises. Get help right away if: Your jaw is stuck in one place and you cannot move it. You cannot open or close your mouth. Summary Jaw range of motion exercises are exercises that help your jaw move better. Take actions to prevent or relieve jaw pain: limit chewing gum or eating hard foods; clenching your jaw or teeth; or leaning on your jaw, such as resting your jaw in your hand while leaning on a desk. Repeat each of the jaw range of motion exercises 8 times, 1-2 times a day, or as told by your health care provider. Contact a health care provider if you have clicking or popping sounds while doing the exercises. This information is not intended to replace advice given to you by your health care provider. Make sure you discuss any questions you have with your health care provider. Document Revised: 09/12/2020 Document Reviewed: 09/12/2020 Elsevier Patient Education  2023 [employer]  

## 2021-08-05 ENCOUNTER — Other Ambulatory Visit: Payer: Self-pay | Admitting: Family

## 2021-08-05 DIAGNOSIS — I4891 Unspecified atrial fibrillation: Secondary | ICD-10-CM

## 2021-08-18 ENCOUNTER — Ambulatory Visit (INDEPENDENT_AMBULATORY_CARE_PROVIDER_SITE_OTHER): Payer: Medicare HMO | Admitting: Family Medicine

## 2021-08-18 ENCOUNTER — Encounter: Payer: Self-pay | Admitting: Family Medicine

## 2021-08-18 VITALS — BP 133/71 | HR 51 | Temp 98.1°F | Ht 71.0 in | Wt 216.1 lb

## 2021-08-18 DIAGNOSIS — M7989 Other specified soft tissue disorders: Secondary | ICD-10-CM | POA: Diagnosis not present

## 2021-08-18 DIAGNOSIS — R6884 Jaw pain: Secondary | ICD-10-CM | POA: Diagnosis not present

## 2021-08-18 DIAGNOSIS — S02641D Fracture of ramus of right mandible, subsequent encounter for fracture with routine healing: Secondary | ICD-10-CM

## 2021-08-18 NOTE — Patient Instructions (Signed)
Mandibular Fracture  A mandibular fracture is a break in the lower bone of the jaw (mandible). Mandibular fractures may be mild, moderate, or severe. Mild breaks may heal on their own, and more severe breaks may require surgery. What are the causes? The most common cause of this injury is a hard, direct hit (trauma) to your jaw. This can happen from: A motor vehicle accident. Physical violence, such as a punch or strike to your mouth or face. A fall from a high place. What are the signs or symptoms? Symptoms of this condition include: Pain. Swelling or redness. Numbness or bruising. Difficulty closing your mouth. Feeling that your upper teeth and lower teeth do not line up when you close your mouth (malocclusion). Difficulty speaking. Trouble swallowing. How is this diagnosed? This condition may be diagnosed with a medical history and physical exam. You may also have imaging tests, such as an X-ray or a CT scan. How is this treated? This condition may be treated with: Rest. If your fracture is mild, resting your jaw may be the only treatment needed. Surgery to put the mandible back in the right position. During surgery, wires may be placed around the teeth to hold the jaw in place while it heals. Medicine for pain. Ice being put on your jaw. This can help with pain and swelling. Soft foods or a liquid diet. Follow these instructions at home: Medicines Take over-the-counter and prescription medicines only as told by your health care provider. Ask your health care provider if the medicine prescribed to you: Requires you to avoid driving or using machinery. Can cause constipation. You may need to take these actions to prevent or treat constipation: Drink enough fluid to keep your urine pale yellow. Take over-the-counter or prescription medicines. Eat foods that are high in fiber, such as beans, whole grains, and fresh fruits and vegetables. Limit foods that are high in fat and  processed sugars, such as fried or sweet foods. Eating and drinking  Eat a balanced diet of soft foods or liquids that are high in protein. Follow your health care provider's instructions. Follow instructions from your health care provider about eating or drinking restrictions while your jaw is healing. These may include eating: Soft foods. Liquefied foods. Food that has been cut into small pieces. Managing pain, stiffness, and swelling If directed, put ice on the injured area. To do this: Put ice in a plastic bag. Place a towel between your skin and the bag. Leave the ice on for 20 minutes, 2-3 times a day.  Activity Rest your jaw as told by your health care provider. Avoid exercising to the point that you become short of breath. Return to your normal activities as told by your health care provider. Ask your health care provider what activities are safe for you. General instructions Sleep on your back to avoid putting pressure on your jaw while your jaw heals. Do not use any products that contain nicotine or tobacco, such as cigarettes, e-cigarettes, and chewing tobacco. These can delay bone healing. If you need help quitting, ask your health care provider. Keep all follow-up visits as told by your health care provider. This is important. Contact a health care provider if: You have a severe headache. You have more numbness of the face. You have jaw pain that is severe and does not lessen with medicine. You have nausea or anxiety that you cannot control. You cannot eat or drink without vomiting. Your swelling or redness gets worse. You have a  fever. Get help right away if: You have difficulty breathing. You feel like your windpipe (trachea) is tightening. You cannot swallow your saliva. You make whistling sounds when you breathe (wheeze). These symptoms may represent a serious problem that is an emergency. Do not wait to see if the symptoms will go away. Get medical help right  away. Call your local emergency services (911 in the [place] Do not drive yourself to the hospital. Summary A mandibular fracture is a break in the lower jawbone (mandible). The most common cause of this injury is a hard, direct hit (trauma) to the jaw. A mandibular fracture may be treated with rest, surgery, medicines, ice, and a soft or liquid diet. This information is not intended to replace advice given to you by your health care provider. Make sure you discuss any questions you have with your health care provider. Document Revised: 11/06/2018 Document Reviewed: 11/06/2018 Elsevier Patient Education  [place].
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
## 2021-08-26 ENCOUNTER — Ambulatory Visit (HOSPITAL_COMMUNITY): Admission: RE | Admit: 2021-08-26 | Payer: Medicare HMO | Source: Ambulatory Visit

## 2021-08-27 DIAGNOSIS — Z8781 Personal history of (healed) traumatic fracture: Secondary | ICD-10-CM | POA: Diagnosis not present

## 2021-08-27 DIAGNOSIS — I889 Nonspecific lymphadenitis, unspecified: Secondary | ICD-10-CM | POA: Diagnosis not present

## 2021-08-27 DIAGNOSIS — E785 Hyperlipidemia, unspecified: Secondary | ICD-10-CM | POA: Diagnosis not present

## 2021-08-27 DIAGNOSIS — J449 Chronic obstructive pulmonary disease, unspecified: Secondary | ICD-10-CM | POA: Diagnosis not present

## 2021-08-27 DIAGNOSIS — R590 Localized enlarged lymph nodes: Secondary | ICD-10-CM | POA: Diagnosis not present

## 2021-08-27 DIAGNOSIS — J351 Hypertrophy of tonsils: Secondary | ICD-10-CM | POA: Diagnosis not present

## 2021-08-27 DIAGNOSIS — Z720 Tobacco use: Secondary | ICD-10-CM | POA: Diagnosis not present

## 2021-08-27 DIAGNOSIS — M278 Other specified diseases of jaws: Secondary | ICD-10-CM | POA: Diagnosis not present

## 2021-08-27 DIAGNOSIS — I2510 Atherosclerotic heart disease of native coronary artery without angina pectoris: Secondary | ICD-10-CM | POA: Diagnosis not present

## 2021-08-27 DIAGNOSIS — I10 Essential (primary) hypertension: Secondary | ICD-10-CM | POA: Diagnosis not present

## 2021-08-29 ENCOUNTER — Telehealth: Payer: Self-pay | Admitting: *Deleted
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
## 2021-08-30 ENCOUNTER — Ambulatory Visit (INDEPENDENT_AMBULATORY_CARE_PROVIDER_SITE_OTHER): Payer: Medicare HMO | Admitting: Family

## 2021-08-30 ENCOUNTER — Encounter: Payer: Self-pay | Admitting: Family

## 2021-08-30 VITALS — BP 127/72 | HR 56 | Temp 97.1°F | Ht 71.0 in | Wt 214.4 lb

## 2021-08-30 DIAGNOSIS — Z09 Encounter for follow-up examination after completed treatment for conditions other than malignant neoplasm: Secondary | ICD-10-CM

## 2021-08-30 DIAGNOSIS — S02652D Fracture of angle of left mandible, subsequent encounter for fracture with routine healing: Secondary | ICD-10-CM

## 2021-08-30 DIAGNOSIS — R590 Localized enlarged lymph nodes: Secondary | ICD-10-CM | POA: Diagnosis not present

## 2021-08-30 DIAGNOSIS — S02641D Fracture of ramus of right mandible, subsequent encounter for fracture with routine healing: Secondary | ICD-10-CM

## 2021-08-30 LAB — CBC WITH DIFFERENTIAL/PLATELET
Basophils Absolute: 0 10*3/uL (ref 0.0–0.2)
Basos: 0 %
EOS (ABSOLUTE): 0.2 10*3/uL (ref 0.0–0.4)
Eos: 2 %
Hematocrit: 42.5 % (ref 37.5–51.0)
Hemoglobin: 14.4 g/dL (ref 13.0–17.7)
Immature Grans (Abs): 0 10*3/uL (ref 0.0–0.1)
Immature Granulocytes: 0 %
Lymphocytes Absolute: 2.1 10*3/uL (ref 0.7–3.1)
Lymphs: 21 %
MCH: 29.6 pg (ref 26.6–33.0)
MCHC: 33.9 g/dL (ref 31.5–35.7)
MCV: 87 fL (ref 79–97)
Monocytes Absolute: 0.7 10*3/uL (ref 0.1–0.9)
Monocytes: 7 %
Neutrophils Absolute: 7 10*3/uL (ref 1.4–7.0)
Neutrophils: 70 %
Platelets: 230 10*3/uL (ref 150–450)
RBC: 4.87 x10E6/uL (ref 4.14–5.80)
RDW: 12.7 % (ref 11.6–15.4)
WBC: 10 10*3/uL (ref 3.4–10.8)
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
## 2021-08-30 NOTE — Patient Instructions (Signed)
Lymphadenopathy  Lymphadenopathy means that your lymph glands are swollen or larger than normal. Lymph glands, also called lymph nodes, are collections of tissue that filter excess fluid, bacteria, viruses, and waste from your bloodstream. They are part of your body's disease-fighting system (immune system), which protects your body from germs. There may be different causes of lymphadenopathy, depending on where it is in your body. Some types go away on their own. Lymphadenopathy can occur anywhere that you have lymph glands, including these areas: Neck (cervical lymphadenopathy). Chest (mediastinal lymphadenopathy). Lungs (hilar lymphadenopathy). Underarms (axillary lymphadenopathy). Groin (inguinal lymphadenopathy). When your immune system responds to germs, infection-fighting cells and fluid build up in your lymph glands. This causes some swelling and enlargement. If the lymph nodes do not go back to normal size after you have an infection or disease, your health care provider may do tests. These tests help to monitor your condition and find the reason why the glands are still swollen and enlarged. Follow these instructions at home:  Get plenty of rest. Your health care provider may recommend over-the-counter medicines for pain. Take over-the-counter and prescription medicines only as told by your health care provider. If directed, apply heat to swollen lymph glands as often as told by your health care provider. Use the heat source that your health care provider recommends, such as a moist heat pack or a heating pad. Place a towel between your skin and the heat source. Leave the heat on for 20-30 minutes. Remove the heat if your skin turns bright red. This is especially important if you are unable to feel pain, heat, or cold. You may have a greater risk of getting burned. Check your affected lymph glands every day for changes. Check other lymph gland areas as told by your health care provider.  Check for changes such as: More swelling. Sudden increase in size. Redness or pain. Hardness. Keep all follow-up visits. This is important. Contact a health care provider if you have: Lymph glands that: Are still swollen after 2 weeks. Have suddenly gotten bigger or the swelling spreads. Are red, painful, or hard. Fluid leaking from the skin near an enlarged lymph gland. Problems with breathing. A fever, chills, or night sweats. Fatigue. A sore throat. Pain in your abdomen. Weight loss. Get help right away if you have: Severe pain. Chest pain. Shortness of breath. These symptoms may represent a serious problem that is an emergency. Do not wait to see if the symptoms will go away. Get medical help right away. Call your local emergency services (911 in the [place] Do not drive yourself to the hospital. Summary Lymphadenopathy means that your lymph glands are swollen or larger than normal. Lymph glands, also called lymph nodes, are collections of tissue that filter excess fluid, bacteria, viruses, and waste from the bloodstream. They are part of your body's disease-fighting system (immune system). Lymphadenopathy can occur anywhere that you have lymph glands. If the lymph nodes do not go back to normal size after you have an infection or disease, your health care provider may do tests to monitor your condition and find the reason why the glands are still swollen and enlarged. Check your affected lymph glands every day for changes. Check other lymph gland areas as told by your health care provider. This information is not intended to replace advice given to you by your health care provider. Make sure you discuss any questions you have with your health care provider. Document Revised: 11/26/2019 Document Reviewed: 11/26/2019 Elsevier Patient Education  2023 Elsevier Inc.  

## 2021-09-05 ENCOUNTER — Ambulatory Visit (INDEPENDENT_AMBULATORY_CARE_PROVIDER_SITE_OTHER): Payer: Medicare HMO | Admitting: Family

## 2021-09-05 ENCOUNTER — Encounter: Payer: Self-pay | Admitting: Family

## 2021-09-05 VITALS — BP 117/59 | HR 57 | Temp 97.9°F | Ht 71.0 in | Wt 218.6 lb

## 2021-09-05 DIAGNOSIS — M5136 Other intervertebral disc degeneration, lumbar region: Secondary | ICD-10-CM | POA: Diagnosis not present

## 2021-09-05 DIAGNOSIS — Z8673 Personal history of transient ischemic attack (TIA), and cerebral infarction without residual deficits: Secondary | ICD-10-CM

## 2021-09-05 DIAGNOSIS — M549 Dorsalgia, unspecified: Secondary | ICD-10-CM

## 2021-09-05 DIAGNOSIS — E1142 Type 2 diabetes mellitus with diabetic polyneuropathy: Secondary | ICD-10-CM

## 2021-09-05 DIAGNOSIS — I2510 Atherosclerotic heart disease of native coronary artery without angina pectoris: Secondary | ICD-10-CM

## 2021-09-05 DIAGNOSIS — I700 Atherosclerosis of aorta: Secondary | ICD-10-CM

## 2021-09-05 DIAGNOSIS — I6930 Unspecified sequelae of cerebral infarction: Secondary | ICD-10-CM

## 2021-09-05 DIAGNOSIS — G8929 Other chronic pain: Secondary | ICD-10-CM

## 2021-09-05 DIAGNOSIS — E559 Vitamin D deficiency, unspecified: Secondary | ICD-10-CM

## 2021-09-05 DIAGNOSIS — Z720 Tobacco use: Secondary | ICD-10-CM

## 2021-09-05 DIAGNOSIS — I2584 Coronary atherosclerosis due to calcified coronary lesion: Secondary | ICD-10-CM | POA: Diagnosis not present

## 2021-09-05 DIAGNOSIS — I4891 Unspecified atrial fibrillation: Secondary | ICD-10-CM | POA: Diagnosis not present

## 2021-09-05 DIAGNOSIS — E669 Obesity, unspecified: Secondary | ICD-10-CM

## 2021-09-05 DIAGNOSIS — J438 Other emphysema: Secondary | ICD-10-CM | POA: Diagnosis not present

## 2021-09-05 DIAGNOSIS — G500 Trigeminal neuralgia: Secondary | ICD-10-CM

## 2021-09-05 DIAGNOSIS — E785 Hyperlipidemia, unspecified: Secondary | ICD-10-CM

## 2021-09-05 DIAGNOSIS — F331 Major depressive disorder, recurrent, moderate: Secondary | ICD-10-CM

## 2021-09-05 DIAGNOSIS — I10 Essential (primary) hypertension: Secondary | ICD-10-CM | POA: Diagnosis not present

## 2021-09-05 DIAGNOSIS — R590 Localized enlarged lymph nodes: Secondary | ICD-10-CM

## 2021-09-05 DIAGNOSIS — G894 Chronic pain syndrome: Secondary | ICD-10-CM

## 2021-09-05 LAB — BAYER DCA HB A1C WAIVED: HB A1C (BAYER DCA - WAIVED): 5.5 % (ref 4.8–5.6)
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
## 2021-09-05 NOTE — Patient Instructions (Signed)
Lymphadenopathy  Lymphadenopathy means that your lymph glands are swollen or larger than normal. Lymph glands, also called lymph nodes, are collections of tissue that filter excess fluid, bacteria, viruses, and waste from your bloodstream. They are part of your body's disease-fighting system (immune system), which protects your body from germs. There may be different causes of lymphadenopathy, depending on where it is in your body. Some types go away on their own. Lymphadenopathy can occur anywhere that you have lymph glands, including these areas: Neck (cervical lymphadenopathy). Chest (mediastinal lymphadenopathy). Lungs (hilar lymphadenopathy). Underarms (axillary lymphadenopathy). Groin (inguinal lymphadenopathy). When your immune system responds to germs, infection-fighting cells and fluid build up in your lymph glands. This causes some swelling and enlargement. If the lymph nodes do not go back to normal size after you have an infection or disease, your health care provider may do tests. These tests help to monitor your condition and find the reason why the glands are still swollen and enlarged. Follow these instructions at home:  Get plenty of rest. Your health care provider may recommend over-the-counter medicines for pain. Take over-the-counter and prescription medicines only as told by your health care provider. If directed, apply heat to swollen lymph glands as often as told by your health care provider. Use the heat source that your health care provider recommends, such as a moist heat pack or a heating pad. Place a towel between your skin and the heat source. Leave the heat on for 20-30 minutes. Remove the heat if your skin turns bright red. This is especially important if you are unable to feel pain, heat, or cold. You may have a greater risk of getting burned. Check your affected lymph glands every day for changes. Check other lymph gland areas as told by your health care provider.  Check for changes such as: More swelling. Sudden increase in size. Redness or pain. Hardness. Keep all follow-up visits. This is important. Contact a health care provider if you have: Lymph glands that: Are still swollen after 2 weeks. Have suddenly gotten bigger or the swelling spreads. Are red, painful, or hard. Fluid leaking from the skin near an enlarged lymph gland. Problems with breathing. A fever, chills, or night sweats. Fatigue. A sore throat. Pain in your abdomen. Weight loss. Get help right away if you have: Severe pain. Chest pain. Shortness of breath. These symptoms may represent a serious problem that is an emergency. Do not wait to see if the symptoms will go away. Get medical help right away. Call your local emergency services (911 in the [place] Do not drive yourself to the hospital. Summary Lymphadenopathy means that your lymph glands are swollen or larger than normal. Lymph glands, also called lymph nodes, are collections of tissue that filter excess fluid, bacteria, viruses, and waste from the bloodstream. They are part of your body's disease-fighting system (immune system). Lymphadenopathy can occur anywhere that you have lymph glands. If the lymph nodes do not go back to normal size after you have an infection or disease, your health care provider may do tests to monitor your condition and find the reason why the glands are still swollen and enlarged. Check your affected lymph glands every day for changes. Check other lymph gland areas as told by your health care provider. This information is not intended to replace advice given to you by your health care provider. Make sure you discuss any questions you have with your health care provider. Document Revised: 11/26/2019 Document Reviewed: 11/26/2019 Elsevier Patient Education  2023 Elsevier Inc.  

## 2021-09-06 LAB — CMP14+EGFR
ALT: 16 IU/L (ref 0–44)
AST: 12 IU/L (ref 0–40)
Albumin/Globulin Ratio: 1.6 (ref 1.2–2.2)
Albumin: 4.2 g/dL (ref 3.9–4.9)
Alkaline Phosphatase: 105 IU/L (ref 44–121)
BUN/Creatinine Ratio: 17 (ref 10–24)
BUN: 12 mg/dL (ref 8–27)
Bilirubin Total: 0.3 mg/dL (ref 0.0–1.2)
CO2: 22 mmol/L (ref 20–29)
Calcium: 8.9 mg/dL (ref 8.6–10.2)
Chloride: 105 mmol/L (ref 96–106)
Creatinine, Ser: 0.69 mg/dL — ABNORMAL LOW (ref 0.76–1.27)
Globulin, Total: 2.7 g/dL (ref 1.5–4.5)
Glucose: 110 mg/dL — ABNORMAL HIGH (ref 70–99)
Potassium: 3.9 mmol/L (ref 3.5–5.2)
Sodium: 143 mmol/L (ref 134–144)
Total Protein: 6.9 g/dL (ref 6.0–8.5)
eGFR: 105 mL/min/{1.73_m2} (ref >59)

## 2021-09-12 DIAGNOSIS — C770 Secondary and unspecified malignant neoplasm of lymph nodes of head, face and neck: Secondary | ICD-10-CM | POA: Diagnosis not present

## 2021-09-12 DIAGNOSIS — I2510 Atherosclerotic heart disease of native coronary artery without angina pectoris: Secondary | ICD-10-CM | POA: Diagnosis not present

## 2021-09-12 DIAGNOSIS — J449 Chronic obstructive pulmonary disease, unspecified: Secondary | ICD-10-CM | POA: Diagnosis not present

## 2021-09-12 DIAGNOSIS — F17210 Nicotine dependence, cigarettes, uncomplicated: Secondary | ICD-10-CM | POA: Diagnosis not present

## 2021-09-12 DIAGNOSIS — R590 Localized enlarged lymph nodes: Secondary | ICD-10-CM | POA: Insufficient documentation

## 2021-09-12 DIAGNOSIS — C801 Malignant (primary) neoplasm, unspecified: Secondary | ICD-10-CM | POA: Diagnosis not present

## 2021-09-12 DIAGNOSIS — Z7901 Long term (current) use of anticoagulants: Secondary | ICD-10-CM | POA: Diagnosis not present

## 2021-09-12 DIAGNOSIS — C4442 Squamous cell carcinoma of skin of scalp and neck: Secondary | ICD-10-CM | POA: Diagnosis not present

## 2021-09-16 ENCOUNTER — Ambulatory Visit: Payer: Medicare HMO | Admitting: *Deleted

## 2021-09-16 NOTE — Patient Instructions (Signed)
Matthew [name]  At some point during the past 4 years, I have worked with you through the Chronic Care Management Program at [facility].  Due to program changes I am removing myself from your care team.   If you are currently active with another CCM Team Member, you will remain active with them unless they reach out to you with additional information.   Please give me a call to discuss transition to Care Coordination services with [name], RN Care Coordinator   Thank you for allowing me to participate in your your healthcare journey.  Chong Sicilian, BSN, RN-BC Proofreader Dial: [phone]

## 2021-09-16 NOTE — Chronic Care Management (AMB) (Signed)
  Chronic Care Management   Note  09/16/2021 Name: Matthew [name] MRN: [id number] DOB: [birth year]   Due to changes in the Chronic Care Management program, I am removing myself as the [employer] from the Care Team and closing [place]. Patient was not scheduled to be followed by the RN Care Coordination nurse for [facility]. Left a HIPAA compliant voicemail for patient to return my call to discuss transition to Care Coordination.  Patient has an open Care Plan with another CCM team member, Theadore Nan, LCSW.  I will forward this case closure encounter to the other team member(s). Patient does not have a current CCM referral placed since 06/13/21. CCM enrollment status changed to "not enrolled".   Patient's PCP can place a new referral if the they needs Care Management or Care Coordination services in the future.  Chong Sicilian, BSN, RN-BC Proofreader Dial: [phone]

## 2021-09-19 ENCOUNTER — Other Ambulatory Visit (HOSPITAL_COMMUNITY): Payer: Self-pay | Admitting: Otolaryngology

## 2021-09-19 ENCOUNTER — Other Ambulatory Visit: Payer: Self-pay | Admitting: Family

## 2021-09-19 DIAGNOSIS — C7989 Secondary malignant neoplasm of other specified sites: Secondary | ICD-10-CM

## 2021-09-19 DIAGNOSIS — I10 Essential (primary) hypertension: Secondary | ICD-10-CM

## 2021-09-20 ENCOUNTER — Telehealth: Payer: Self-pay | Admitting: Hematology and Oncology

## 2021-09-20 NOTE — Telephone Encounter (Signed)
Scheduled appt per 8/3 referral. Called pt, no answer. Left msg with appt date/time and requested for pt to call back to confirm appt. Will f/u again tomorrow if I do not hear anything back.

## 2021-09-20 NOTE — Telephone Encounter (Signed)
Spoke to pt's son, [name], about upcoming appt with Dr. Chryl Heck. They are not able to make the appt on 8/18. R/s to 8/22 per son's request. He is aware to arrive 30 mins prior.

## 2021-09-22 ENCOUNTER — Encounter (HOSPITAL_COMMUNITY)
Admission: RE | Admit: 2021-09-22 | Discharge: 2021-09-22 | Disposition: A | Discharge status: Home / Self Care | Payer: Medicare HMO | Source: Ambulatory Visit | Attending: Otolaryngology | Admitting: Otolaryngology

## 2021-09-22 DIAGNOSIS — C801 Malignant (primary) neoplasm, unspecified: Secondary | ICD-10-CM | POA: Diagnosis not present

## 2021-09-22 DIAGNOSIS — C770 Secondary and unspecified malignant neoplasm of lymph nodes of head, face and neck: Secondary | ICD-10-CM | POA: Diagnosis not present

## 2021-09-22 DIAGNOSIS — C4442 Squamous cell carcinoma of skin of scalp and neck: Secondary | ICD-10-CM | POA: Diagnosis not present

## 2021-09-22 DIAGNOSIS — C7989 Secondary malignant neoplasm of other specified sites: Secondary | ICD-10-CM | POA: Diagnosis not present

## 2021-09-22 LAB — GLUCOSE, CAPILLARY: Glucose-Capillary: 133 mg/dL — ABNORMAL HIGH (ref 70–99)

## 2021-09-22 MED ORDER — FLUDEOXYGLUCOSE F - 18 (FDG) INJECTION
10.9000 | Freq: Once | INTRAVENOUS | Status: AC
  Administered 2021-09-22: 10.8 via INTRAVENOUS

## 2021-09-22 NOTE — Progress Notes (Signed)
Oncology Nurse Navigator Documentation   I called and left a voice mail with Mr. Gaida son introducing myself as the head and neck navigator who will help them with his journey here at the [facility]. I provided my direct contact information and asked for a return call when he was available.   Harlow Asa RN, BSN, OCN Head & Neck Oncology Nurse [place] at [facility] Phone # [phone]  Fax # [phone]

## 2021-09-23 ENCOUNTER — Ambulatory Visit
Admission: RE | Admit: 2021-09-23 | Discharge: 2021-09-23 | Disposition: A | Discharge status: Home / Self Care | Payer: Self-pay | Source: Ambulatory Visit | Attending: Radiation Oncology | Admitting: Radiation Oncology

## 2021-09-23 ENCOUNTER — Other Ambulatory Visit: Payer: Self-pay | Admitting: Radiation Oncology

## 2021-09-23 DIAGNOSIS — C7989 Secondary malignant neoplasm of other specified sites: Secondary | ICD-10-CM

## 2021-09-23 NOTE — Progress Notes (Incomplete)
Head and Neck Cancer Location of Tumor / Histology:  Metastatic squamous neck cancer with occult primary  Patient presented with symptoms of: (from initial ENT consult on 09/12/21) "1 or 46-monthhistory of a right-sided neck mass"  Biopsies revealed:  09/12/2021 Final Interpretation   Right neck mass, fine needle aspiration: - Consistent with keratinizing squamous cell carcinoma  PET Scan 09/22/2021 --IMPRESSION: The known nodal metastasis in the upper right neck is intensely hypermetabolic, corresponding with an enlarged level 2 lymph node. No primary malignancy identified within the pharyngeal mucosal space. Consider further evaluation with CT of the neck with contrast and/or direct visualization. No other definite metastases. Small axillary lymph nodes bilaterally demonstrate only low level metabolic activity, likely reactive.  08/27/2021 CT Neck w/ Contrast  --IMPRESSION Enlarged right cervical lymph nodes Enlarged bilateral tonsils  Nutrition Status Yes No Comments  Weight changes? '[]'$  '[x]'$    Swallowing concerns? '[]'$  '[x]'$  Does have trouble chewing due to fractured lower jaw and being edentulous  PEG? '[]'$  '[x]'$     Referrals Yes No Comments  Social Work? '[x]'$  '[]'$    Dentistry? '[x]'$  '[]'$    Swallowing therapy? '[x]'$  '[]'$    Nutrition? '[x]'$  '[]'$    Med/Onc? '[x]'$  '[]'$     Safety Issues Yes No Comments  Prior radiation? '[]'$  '[x]'$    Pacemaker/ICD? '[]'$  '[x]'$    Possible current pregnancy? '[]'$  '[x]'$  N/A  Is the patient on methotrexate? '[]'$  '[x]'$     Tobacco/Marijuana/Snuff/ETOH use: Patient is a current every day smoker (~1.5 packs/day). Denies any current alcohol consumption or history of recreational drug use  Past/Anticipated interventions by otolaryngology, if any:  09/22/2021 --Dr. JIzora Gala(telephone note)  We discussed the results of the PET scan with his son JLarkin Ina  The only activity is and that 1 lymph node in the neck.  No primary site is identified.  We talked about the neck step is normally exam  under anesthesia with directed biopsies and possible tonsillectomy.  He is on anticoagulation therapy for multiple strokes.  It may not make sense to put him through the risk of stopping his anticoagulation and the risk of general anesthesia with his high risk of stroke.  I am going to discuss him at tumor board and we will see if we can just treat him with the information that we have right now  Past/Anticipated interventions by medical oncology, if any:  Scheduled for consultation with Dr. PBenay Pike08/22/2023  Current Complaints / other details:  Patient has a large knot on the right side of his head/temple. He thinks he fell ~4 days ago, but is not able to recall clearly. [place] lives in [place] and patient is not able to drive himself to appointments, so he would need to utilize transportation service

## 2021-09-26 NOTE — Progress Notes (Signed)
Radiation Oncology         [phone] ________________________________  Initial Outpatient Consultation  Name: Matthew [name] MRN: [id number]  Date: 09/27/2021  DOB: Sep 11, 1960  VW:[id number], Theador Hawthorne, FNP  [name], MD   REFERRING PHYSICIAN: [name], MD  DIAGNOSIS: No diagnosis found.  Right neck mass - keratinizing squamous cell carcinoma  CHIEF COMPLAINT: Here to discuss management of neck cancer  HISTORY OF PRESENT ILLNESS::Matthew [name] is a 61 y.o. male who presented to Ronnald Collum FNP at [place] on 07/14/21 with the cc of jaw pain. During this visit, the patient was noted to have a knot on the right side of his neck which he thought/claimed that he already had further work-up for. 6 months prior to this visit, the patient had broken his jaw and had a metal plate placed. Since the metal plate was placed, the patient reported issues with pain. He also presented to the [facility] on 07/03/21 for jaw pain and was given prednisone. Upon record review, Ronnald Collum FNP found that the patient was scheduled to have a neck CT performed in the past but he did not follow through with this. The patient was again offered further workup but refused, and requested more prednisone. Given that prednisone could potentially slow down the healing of his jaw, the patient was instructed to take tylenol and avoid NSAIDS.   The patient returned to [place] on 08/18/21 with persistent intermittent jaw pain. During this visit, the right neck soft tissue mass was again noted on examination, detailed to be about the size of a golf ball. No warmth, erythema, or tenderness was appreciated. Following further discussion, the patient finally agreed to have imaging performed for further evaluation of the mass.   Prior to undergoing scheduled imaging, the patient presented to the [facility] ED on 08/27/21 seeking further evaluation of the right sided neck  mass. Neck CT performed at that time revealed an enlarged right level 2 cervical lymph node measuring 2.4 x 2.8 cm. CT also revealed enlarged bilateral tonsils. At discharge, the patient was given prophylactic azithromycin and instructed to have a referral placed to ENT for biopsy.   Accordingly, the patient was referred to Dr. Constance Holster by his PCP. Examination performed during his initial consultation with Dr. Constance Holster on 09/12/21 revealed a 4 cm soft mobile right level 2 node, otherwise no cervical adenopathy or thyroid nodules were appreciated. Indirect laryngoscopy also performed revealed healthy, mobile vocal cords, without mucosal lesions in the hypopharynx or larynx.   FNA of the right neck mass collected on 09/12/21 by Dr. Constance Holster revealed findings consistent with keratinizing squamous cell carcinoma.   Upon record review, Dr. Constance Holster found that the patient had an MRI of the neck performed this past September 2022 which showed a small lymph node. Considering this, Dr. Constance Holster had noted concern for oropharyngeal cancer primary.   Pertinent imaging thus far includes a PET scan on 09/22/21 revealing the known nodal metastasis in the upper right neck as intensely hypermetabolic, corresponding with an enlarged level 2 lymph node. No primary malignancy identified within the pharyngeal mucosal space. PET also showed small axillary lymph nodes bilaterally with low level metabolic activity, likely reactive in etiology, but no definite sites of metastases were appreciated.    Swallowing issues, if any: none  Weight Changes: none  Pain status: jaw pain  Other symptoms: cervical lymphadenopathy   Tobacco history, if any: current smokier with an 80 pack year smoking  history (started smoking approximately 46 years ago)  ETOH abuse, if any: does not drink currently  Prior cancers, if any: none  PREVIOUS RADIATION THERAPY: No  PAST MEDICAL HISTORY:  has a past medical history of Abscess, Anxiety, Aphthous  ulcer, Arthritis, Asthma, Chronic bronchitis, Chronic pain, COPD (chronic obstructive pulmonary disease) ([place]), CVA (cerebral vascular accident) ([place]), Depression, Hyperlipidemia, Hypertension, Trigeminal neuralgia, and Vertigo.    PAST SURGICAL HISTORY: Past Surgical History:  Procedure Laterality Date   CHOLECYSTECTOMY     HAND RECONSTRUCTION Right 1990's   TRIGEMINAL NERVE DECOMPRESSION Right     FAMILY HISTORY: family history includes Cancer in his father; Diabetes in his father and mother; Early death in his brother; Emphysema in his father; Hypertension in his father and mother.  SOCIAL HISTORY:  reports that he has been smoking cigarettes. He started smoking about 46 years ago. He has a 80.00 pack-year smoking history. He has never used smokeless tobacco. He reports that he does not currently use alcohol. He reports that he does not use drugs.  ALLERGIES: Penicillins  MEDICATIONS:  Current Outpatient Medications  Medication Sig Dispense Refill   albuterol (VENTOLIN HFA) 108 (90 Base) MCG/ACT inhaler TAKE 2 PUFFS BY MOUTH EVERY 6 HOURS AS NEEDED FOR WHEEZE OR SHORTNESS OF BREATH 18 each 4   amLODipine (NORVASC) 10 MG tablet Take 1 tablet (10 mg total) by mouth daily. (NEEDS TO BE SEEN BEFORE NEXT REFILL) 90 tablet 3   atorvastatin (LIPITOR) 40 MG tablet Take 1 tablet (40 mg total) by mouth daily. 90 tablet 1   azithromycin (ZITHROMAX) 500 MG tablet Take 500 mg by mouth daily.     baclofen (LIORESAL) 10 MG tablet TAKE 1 TABLET BY MOUTH THREE TIMES A DAY AS NEEDED FOR MUSCLE SPASMS 270 tablet 0   blood glucose meter kit and supplies Dispense based on patient and insurance preference. Use up to four times daily as directed. (FOR ICD-10 E10.9, E11.9). 1 each 0   carvedilol (COREG) 3.125 MG tablet TAKE 1 TABLET BY MOUTH TWICE A DAY WITH MEALS 180 tablet 1   DULoxetine (CYMBALTA) 60 MG capsule Take 1 capsule (60 mg total) by mouth daily. (NEEDS TO BE SEEN BEFORE NEXT REFILL) 90 capsule 3    ELIQUIS 5 MG TABS tablet TAKE 1 TABLET BY MOUTH TWICE A DAY 180 tablet 1   fluticasone (FLONASE) 50 MCG/ACT nasal spray SPRAY 2 SPRAYS INTO EACH NOSTRIL EVERY DAY 48 mL 1   gabapentin (NEURONTIN) 800 MG tablet TAKE 1 TABLET IN THE MORNING, AT NOON, IN THE EVENING, AND AT BEDTIME. (NEEDS APPT FOR REFILLS) 120 tablet 3   glucose blood test strip Test BS daily Dx E11.65 100 each 3   lidocaine (XYLOCAINE) 2 % solution SMARTSIG:By Mouth     lisinopril (ZESTRIL) 5 MG tablet Take 1 tablet (5 mg total) by mouth daily. 90 tablet 1   loratadine (CLARITIN) 10 MG tablet Take 1 tablet (10 mg total) by mouth daily. 90 tablet 3   metFORMIN (GLUCOPHAGE) 500 MG tablet Take 1 tablet (500 mg total) by mouth daily with breakfast. 90 tablet 4   OXcarbazepine (TRILEPTAL) 150 MG tablet TAKE 1 TABLET BY MOUTH TWICE A DAY 180 tablet 5   TRELEGY ELLIPTA 100-62.5-25 MCG/ACT AEPB TAKE 1 PUFF BY MOUTH EVERY DAY 60 each 11   Vitamin D, Ergocalciferol, (DRISDOL) 1.25 MG (50000 UNIT) CAPS capsule Take 1 capsule (50,000 Units total) by mouth every 7 (seven) days. 12 capsule 3   No current facility-administered medications  for this encounter.    REVIEW OF SYSTEMS:  Notable for that above.   PHYSICAL EXAM:  vitals were not taken for this visit.   General: Alert and oriented, in no acute distress HEENT: Head is normocephalic. Extraocular movements are intact. Oropharynx is notable for ***. Neck: Neck is notable for *** Heart: Regular in rate and rhythm with no murmurs, rubs, or gallops. Chest: Clear to auscultation bilaterally, with no rhonchi, wheezes, or rales. Abdomen: Soft, nontender, nondistended, with no rigidity or guarding. Extremities: No cyanosis or edema. Lymphatics: see Neck Exam Skin: No concerning lesions. Musculoskeletal: symmetric strength and muscle tone throughout. Neurologic: Cranial nerves II through XII are grossly intact. No obvious focalities. Speech is fluent. Coordination is intact. Psychiatric:  Judgment and insight are intact. Affect is appropriate.   ECOG = ***  0 - Asymptomatic (Fully active, able to carry on all predisease activities without restriction)  1 - Symptomatic but completely ambulatory (Restricted in physically strenuous activity but ambulatory and able to carry out work of a light or sedentary nature. For example, light housework, office work)  2 - Symptomatic, <50% in bed during the day (Ambulatory and capable of all self care but unable to carry out any work activities. Up and about more than 50% of waking hours)  3 - Symptomatic, >50% in bed, but not bedbound (Capable of only limited self-care, confined to bed or chair 50% or more of waking hours)  4 - Bedbound (Completely disabled. Cannot carry on any self-care. Totally confined to bed or chair)  5 - Death   Eustace Pen MM, Creech RH, Tormey DC, et al. [phone]). "Toxicity and response criteria of the [facility] Group". [place] Oncol. 5 (6): 649-55   LABORATORY DATA:  Lab Results  Component Value Date   WBC 10.0 08/30/2021   HGB 14.4 08/30/2021   HCT 42.5 08/30/2021   MCV 87 08/30/2021   PLT 230 08/30/2021   CMP     Component Value Date/Time   NA 143 09/05/2021 1149   K 3.9 09/05/2021 1149   CL 105 09/05/2021 1149   CO2 22 09/05/2021 1149   GLUCOSE 110 (H) 09/05/2021 1149   GLUCOSE 294 (H) 08/30/2018 0357   BUN 12 09/05/2021 1149   CREATININE 0.69 (L) 09/05/2021 1149   CREATININE 0.78 08/19/2012 1500   CALCIUM 8.9 09/05/2021 1149   PROT 6.9 09/05/2021 1149   ALBUMIN 4.2 09/05/2021 1149   AST 12 09/05/2021 1149   ALT 16 09/05/2021 1149   ALKPHOS 105 09/05/2021 1149   BILITOT 0.3 09/05/2021 1149   GFRNONAA 101 01/12/2020 1212   GFRNONAA >89 08/19/2012 1500   GFRAA 117 01/12/2020 1212   GFRAA >89 08/19/2012 1500      Lab Results  Component Value Date   TSH 0.292 (L) 08/28/2018     RADIOGRAPHY: NM PET Image Initial (PI) Skull Base To Thigh (F-18 FDG)  Result Date:  09/22/2021 CLINICAL DATA:  Initial treatment strategy for metastatic squamous cell carcinoma on fine-needle aspiration of right neck mass 09/15/2021. EXAM: NUCLEAR MEDICINE PET SKULL BASE TO THIGH TECHNIQUE: 10.8 mCi F-18 FDG was injected intravenously. Full-ring PET imaging was performed from the skull base to thigh after the radiotracer. CT data was obtained and used for attenuation correction and anatomic localization. Fasting blood glucose: 133 mg/dl COMPARISON:  None recent. MRI of the face 11/08/2020, abdominopelvic CT 11/01/2017 and chest CT 01/18/2016. FINDINGS: Mediastinal blood pool activity: SUV max 2.8 NECK: There is a large hypermetabolic right  level 2 lymph node which has enlarged from the previous MRI. This currently measures 3.6 x 2.6 cm on image 36/4 and demonstrates intense hypermetabolic activity with an SUV max of 18.3. No other enlarged or hypermetabolic cervical lymph nodes are identified. The activity within the lymphoid tissue of Waldeyer's ring is nearly symmetric, and no focal lesions of the pharyngeal mucosal space are identified. Incidental CT findings: Bilateral carotid atherosclerosis. CHEST: Mildly prominent metabolic activity within small axillary lymph nodes bilaterally (up to SUV max of 2.9 on the right and 2.6 on the left). No hypermetabolic mediastinal or hilar lymph nodes. No hypermetabolic pulmonary activity or suspicious nodularity. Incidental CT findings: Atherosclerosis of the aorta, great vessels and coronary arteries. ABDOMEN/PELVIS: There is no hypermetabolic activity within the liver, adrenal glands, spleen or pancreas. There is no hypermetabolic nodal activity. Bowel activity within physiologic limits. Incidental CT findings: Aortic and branch vessel atherosclerosis. Previous cholecystectomy. Mild distal colonic diverticulosis. SKELETON: There is no hypermetabolic activity to suggest osseous metastatic disease. Incidental CT findings: Mild spondylosis. IMPRESSION: 1. The  known nodal metastasis in the upper right neck is intensely hypermetabolic, corresponding with an enlarged level 2 lymph node. 2. No primary malignancy identified within the pharyngeal mucosal space. Consider further evaluation with CT of the neck with contrast and/or direct visualization. 3. No other definite metastases. Small axillary lymph nodes bilaterally demonstrate only low level metabolic activity, likely reactive. Electronically Signed   By: Richardean Sale M.D.   On: 09/22/2021 08:52      IMPRESSION/PLAN:  This is a delightful patient with head and neck cancer. I *** recommend radiotherapy for this patient.  We discussed the potential risks, benefits, and side effects of radiotherapy. We talked in detail about acute and late effects. We discussed that some of the most bothersome acute effects may be mucositis, dysgeusia, salivary changes, skin irritation, hair loss, dehydration, weight loss and fatigue. We talked about late effects which include but are not necessarily limited to dysphagia, hypothyroidism, nerve injury, vascular injury, spinal cord injury, xerostomia, trismus, neck edema, and potential injury to any of the tissues in the head and neck region. No guarantees of treatment were given. A consent form was signed and placed in the patient's medical record. The patient is enthusiastic about proceeding with treatment. I look forward to participating in the patient's care.    Simulation (treatment planning) will take place ***  We also discussed that the treatment of head and neck cancer is a multidisciplinary process to maximize treatment outcomes and quality of life. For this reason the following referrals have been or will be made:  *** Medical oncology to discuss chemotherapy   *** Dentistry for dental evaluation, possible extractions in the radiation fields, and /or advice on reducing risk of cavities, osteoradionecrosis, or other oral issues.  *** Nutritionist for nutrition  support during and after treatment.  *** Speech language pathology for swallowing and/or speech therapy.  *** Social work for social support.   *** Physical therapy due to risk of lymphedema in neck and deconditioning.  *** Baseline labs including TSH.  On date of service, in total, I spent *** minutes on this encounter. Patient was seen in person.  __________________________________________   Eppie Gibson, MD  This document serves as a record of services personally performed by Eppie Gibson, MD. It was created on her behalf by Roney Mans, a trained medical scribe. The creation of this record is based on the scribe's personal observations and the provider's statements to them. This  document has been checked and approved by the attending provider.

## 2021-09-27 ENCOUNTER — Ambulatory Visit
Admission: RE | Admit: 2021-09-27 | Discharge: 2021-09-27 | Disposition: A | Discharge status: Home / Self Care | Payer: Medicare HMO | Source: Ambulatory Visit | Attending: Radiation Oncology | Admitting: Radiation Oncology

## 2021-09-27 ENCOUNTER — Ambulatory Visit (INDEPENDENT_AMBULATORY_CARE_PROVIDER_SITE_OTHER): Payer: Medicare HMO | Admitting: Family Medicine

## 2021-09-27 ENCOUNTER — Other Ambulatory Visit (HOSPITAL_COMMUNITY): Payer: Medicare HMO | Admitting: Dentistry

## 2021-09-27 ENCOUNTER — Other Ambulatory Visit: Payer: Self-pay

## 2021-09-27 ENCOUNTER — Encounter: Payer: Self-pay | Admitting: Radiation Oncology

## 2021-09-27 ENCOUNTER — Encounter: Payer: Self-pay | Admitting: Family Medicine

## 2021-09-27 VITALS — BP 118/64 | HR 55 | Temp 98.2°F | Ht 71.0 in | Wt 213.8 lb

## 2021-09-27 VITALS — BP 144/80 | HR 50 | Temp 98.0°F | Wt 213.6 lb

## 2021-09-27 DIAGNOSIS — F17210 Nicotine dependence, cigarettes, uncomplicated: Secondary | ICD-10-CM | POA: Diagnosis not present

## 2021-09-27 DIAGNOSIS — Z7984 Long term (current) use of oral hypoglycemic drugs: Secondary | ICD-10-CM | POA: Diagnosis not present

## 2021-09-27 DIAGNOSIS — G8929 Other chronic pain: Secondary | ICD-10-CM | POA: Insufficient documentation

## 2021-09-27 DIAGNOSIS — Z8673 Personal history of transient ischemic attack (TIA), and cerebral infarction without residual deficits: Secondary | ICD-10-CM | POA: Diagnosis not present

## 2021-09-27 DIAGNOSIS — Z9049 Acquired absence of other specified parts of digestive tract: Secondary | ICD-10-CM | POA: Diagnosis not present

## 2021-09-27 DIAGNOSIS — C7989 Secondary malignant neoplasm of other specified sites: Secondary | ICD-10-CM

## 2021-09-27 DIAGNOSIS — Z79899 Other long term (current) drug therapy: Secondary | ICD-10-CM | POA: Diagnosis not present

## 2021-09-27 DIAGNOSIS — J449 Chronic obstructive pulmonary disease, unspecified: Secondary | ICD-10-CM | POA: Insufficient documentation

## 2021-09-27 DIAGNOSIS — E785 Hyperlipidemia, unspecified: Secondary | ICD-10-CM | POA: Diagnosis not present

## 2021-09-27 DIAGNOSIS — I6523 Occlusion and stenosis of bilateral carotid arteries: Secondary | ICD-10-CM | POA: Insufficient documentation

## 2021-09-27 DIAGNOSIS — L03211 Cellulitis of face: Secondary | ICD-10-CM | POA: Diagnosis not present

## 2021-09-27 DIAGNOSIS — C760 Malignant neoplasm of head, face and neck: Secondary | ICD-10-CM | POA: Diagnosis not present

## 2021-09-27 DIAGNOSIS — K120 Recurrent oral aphthae: Secondary | ICD-10-CM | POA: Insufficient documentation

## 2021-09-27 DIAGNOSIS — R590 Localized enlarged lymph nodes: Secondary | ICD-10-CM | POA: Insufficient documentation

## 2021-09-27 DIAGNOSIS — C801 Malignant (primary) neoplasm, unspecified: Secondary | ICD-10-CM | POA: Diagnosis not present

## 2021-09-27 DIAGNOSIS — Z7901 Long term (current) use of anticoagulants: Secondary | ICD-10-CM | POA: Diagnosis not present

## 2021-09-27 DIAGNOSIS — I10 Essential (primary) hypertension: Secondary | ICD-10-CM | POA: Insufficient documentation

## 2021-09-27 DIAGNOSIS — Z809 Family history of malignant neoplasm, unspecified: Secondary | ICD-10-CM | POA: Diagnosis not present

## 2021-09-27 MED ORDER — CEPHALEXIN 500 MG PO CAPS: 500.0000 mg | ORAL_CAPSULE | Freq: Two times a day (BID) | ORAL | 0 refills | Status: AC

## 2021-09-27 NOTE — Progress Notes (Signed)
Oncology Nurse Navigator Documentation   Met with patient during initial consult with Dr. Isidore Moos. He was accompanied by his son, [name].  Further introduced myself as his/their Navigator, explained my role as a member of the Care Team. Provided New Patient Information packet: Contact information for physician, this navigator, other members of the Care Team Advance Directive information ([place] blue pamphlet with LCSW insert); provided [facility] AD booklet at his request,  Fall Prevention Patient [place] Information sheet Symptom Management Clinic information [facility] campus map with highlight of [place] SLP Information sheet Provided and discussed educational handouts for PEG and PAC. Assisted with post-consult appt scheduling.  He was scheduled to see Dr. Benson Norway today but he is edentulous and the appointment was cancelled. Dr. Raynelle Dick office was made aware.  I have contacted our transportation services to see if we can help provide transportation from his home in [place] [facility]. I've also placed a referral to SW to help with any services that would be helpful to [name].   They verbalized understanding of information provided. I encouraged them to call with questions/concerns moving forward.  Harlow Asa, RN, BSN, OCN Head & Neck Oncology Nurse [place] at [place] [phone]

## 2021-09-27 NOTE — Progress Notes (Signed)
Assessment & Plan:  1. Cellulitis of face Education provided on cellulitis. - cephALEXin (KEFLEX) 500 MG capsule; Take 1 capsule (500 mg total) by mouth 2 (two) times daily for 7 days.  Dispense: 14 capsule; Refill: 0   Follow up plan: Return if symptoms worsen or fail to improve.  Hendricks Limes, MSN, APRN, FNP-C [facility]  Subjective:   Patient ID: Matthew [name], male    DOB: 1960-06-10, 61 y.o.   MRN: [id number]  HPI: Matthew [name] is a 61 y.o. male presenting on 09/27/2021 for Insect Bite (Right side of face x 4-5 days- painful )  Patient reports something bit him on the right side of his face, but he is not sure what.  Reports pain to his face and swelling.  He has been applying an antibiotic ointment.   ROS: Negative unless specifically indicated above in HPI.   Relevant past medical history reviewed and updated as indicated.   Allergies and medications reviewed and updated.   Current Outpatient Medications:    albuterol (VENTOLIN HFA) 108 (90 Base) MCG/ACT inhaler, TAKE 2 PUFFS BY MOUTH EVERY 6 HOURS AS NEEDED FOR WHEEZE OR SHORTNESS OF BREATH, Disp: 18 each, Rfl: 4   amLODipine (NORVASC) 10 MG tablet, Take 1 tablet (10 mg total) by mouth daily. (NEEDS TO BE SEEN BEFORE NEXT REFILL), Disp: 90 tablet, Rfl: 3   atorvastatin (LIPITOR) 40 MG tablet, Take 1 tablet (40 mg total) by mouth daily., Disp: 90 tablet, Rfl: 1   baclofen (LIORESAL) 10 MG tablet, TAKE 1 TABLET BY MOUTH THREE TIMES A DAY AS NEEDED FOR MUSCLE SPASMS, Disp: 270 tablet, Rfl: 0   blood glucose meter kit and supplies, Dispense based on patient and insurance preference. Use up to four times daily as directed. (FOR ICD-10 E10.9, E11.9)., Disp: 1 each, Rfl: 0   carvedilol (COREG) 3.125 MG tablet, TAKE 1 TABLET BY MOUTH TWICE A DAY WITH MEALS, Disp: 180 tablet, Rfl: 1   DULoxetine (CYMBALTA) 60 MG capsule, Take 1 capsule (60 mg total) by mouth daily. (NEEDS TO BE SEEN BEFORE NEXT REFILL), Disp:  90 capsule, Rfl: 3   ELIQUIS 5 MG TABS tablet, TAKE 1 TABLET BY MOUTH TWICE A DAY, Disp: 180 tablet, Rfl: 1   fluticasone (FLONASE) 50 MCG/ACT nasal spray, SPRAY 2 SPRAYS INTO EACH NOSTRIL EVERY DAY, Disp: 48 mL, Rfl: 1   gabapentin (NEURONTIN) 800 MG tablet, TAKE 1 TABLET IN THE MORNING, AT NOON, IN THE EVENING, AND AT BEDTIME. (NEEDS APPT FOR REFILLS), Disp: 120 tablet, Rfl: 3   glucose blood test strip, Test BS daily Dx E11.65, Disp: 100 each, Rfl: 3   lidocaine (XYLOCAINE) 2 % solution, SMARTSIG:By Mouth, Disp: , Rfl:    loratadine (CLARITIN) 10 MG tablet, Take 1 tablet (10 mg total) by mouth daily., Disp: 90 tablet, Rfl: 3   metFORMIN (GLUCOPHAGE) 500 MG tablet, Take 1 tablet (500 mg total) by mouth daily with breakfast., Disp: 90 tablet, Rfl: 4   OXcarbazepine (TRILEPTAL) 150 MG tablet, TAKE 1 TABLET BY MOUTH TWICE A DAY, Disp: 180 tablet, Rfl: 5   TRELEGY ELLIPTA 100-62.5-25 MCG/ACT AEPB, TAKE 1 PUFF BY MOUTH EVERY DAY, Disp: 60 each, Rfl: 11   Vitamin D, Ergocalciferol, (DRISDOL) 1.25 MG (50000 UNIT) CAPS capsule, Take 1 capsule (50,000 Units total) by mouth every 7 (seven) days., Disp: 12 capsule, Rfl: 3   lisinopril (ZESTRIL) 5 MG tablet, Take 1 tablet (5 mg total) by mouth daily., Disp: 90 tablet, Rfl:  1  Allergies  Allergen Reactions   Penicillins Other (See Comments)    Did it involve swelling of the face/tongue/throat, SOB, or low BP? Unknown Did it involve sudden or severe rash/hives, skin peeling, or any reaction on the inside of your mouth or nose? Unknown Did you need to seek medical attention at a hospital or doctor's office? Unknown When did it last happen? childhood  If all above answers are "NO", may proceed with cephalosporin use.     Objective:   BP 118/64   Pulse (!) 55   Temp 98.2 F (36.8 C) (Temporal)   Ht _0  (1.803 m)   Wt 213 lb 12.8 oz (97 kg)   SpO2 94%   BMI 29.82 kg/m    Physical Exam Vitals reviewed.  Constitutional:      General: He is  not in acute distress.    Appearance: Normal appearance. He is not ill-appearing, toxic-appearing or diaphoretic.  HENT:     Head: Normocephalic and atraumatic.  Eyes:     General: No scleral icterus.       Right eye: No discharge.        Left eye: No discharge.     Conjunctiva/sclera: Conjunctivae normal.  Cardiovascular:     Rate and Rhythm: Normal rate.  Pulmonary:     Effort: Pulmonary effort is normal. No respiratory distress.  Musculoskeletal:        General: Normal range of motion.     Cervical back: Normal range of motion.  Skin:    General: Skin is warm and dry.     Comments: Swelling and erythema with warmth to the right temple area. Area in the center that looks like an insect bite.  Neurological:     Mental Status: He is alert and oriented to person, place, and time. Mental status is at baseline.  Psychiatric:        Mood and Affect: Mood normal.        Behavior: Behavior normal.        Thought Content: Thought content normal.        Judgment: Judgment normal.

## 2021-09-28 ENCOUNTER — Encounter: Payer: Self-pay | Admitting: Radiation Oncology

## 2021-09-28 ENCOUNTER — Telehealth: Payer: Self-pay | Admitting: Licensed Clinical Social Worker

## 2021-09-28 DIAGNOSIS — C760 Malignant neoplasm of head, face and neck: Secondary | ICD-10-CM | POA: Insufficient documentation

## 2021-09-28 NOTE — Telephone Encounter (Signed)
[place] Work  Clinical Social Work was referred by Art therapist for assessment of psychosocial needs.  Clinical Social Worker attempted to contact patient by phone  to offer support and assess for needs.   No answer at either number in chart. CSW left VM for pt's son. Mobile number did not have VM available.      [place], [place] Worker [employer]

## 2021-09-29 ENCOUNTER — Other Ambulatory Visit: Payer: Self-pay | Admitting: Family

## 2021-09-29 DIAGNOSIS — I4891 Unspecified atrial fibrillation: Secondary | ICD-10-CM

## 2021-09-29 DIAGNOSIS — I10 Essential (primary) hypertension: Secondary | ICD-10-CM

## 2021-09-30 ENCOUNTER — Ambulatory Visit: Payer: Medicare HMO | Admitting: Hematology and Oncology

## 2021-09-30 ENCOUNTER — Other Ambulatory Visit: Payer: Self-pay | Admitting: Family

## 2021-09-30 ENCOUNTER — Other Ambulatory Visit: Payer: Medicare HMO

## 2021-09-30 DIAGNOSIS — E785 Hyperlipidemia, unspecified: Secondary | ICD-10-CM

## 2021-09-30 NOTE — Telephone Encounter (Signed)
CSW attempted to contact pt's son again. No answer, left VM with direct contact information.   Jizelle Conkey E Sherrin Stahle, LCSW

## 2021-10-03 ENCOUNTER — Other Ambulatory Visit: Payer: Self-pay

## 2021-10-03 DIAGNOSIS — C760 Malignant neoplasm of head, face and neck: Secondary | ICD-10-CM

## 2021-10-04 ENCOUNTER — Inpatient Hospital Stay (HOSPITAL_BASED_OUTPATIENT_CLINIC_OR_DEPARTMENT_OTHER): Payer: Medicare HMO | Admitting: Hematology and Oncology

## 2021-10-04 ENCOUNTER — Encounter: Payer: Self-pay | Admitting: Radiation Oncology

## 2021-10-04 ENCOUNTER — Ambulatory Visit
Admission: RE | Admit: 2021-10-04 | Discharge: 2021-10-04 | Disposition: A | Discharge status: Home / Self Care | Payer: Medicare HMO | Source: Ambulatory Visit | Attending: Radiation Oncology | Admitting: Radiation Oncology

## 2021-10-04 ENCOUNTER — Inpatient Hospital Stay: Payer: Medicare HMO | Attending: Hematology and Oncology

## 2021-10-04 ENCOUNTER — Inpatient Hospital Stay: Payer: Medicare HMO | Admitting: Licensed Clinical Social Worker

## 2021-10-04 ENCOUNTER — Encounter: Payer: Self-pay | Admitting: Hematology and Oncology

## 2021-10-04 ENCOUNTER — Other Ambulatory Visit: Payer: Self-pay

## 2021-10-04 VITALS — BP 123/69 | HR 55 | Temp 97.9°F | Resp 20 | Ht 69.0 in | Wt 215.0 lb

## 2021-10-04 DIAGNOSIS — I10 Essential (primary) hypertension: Secondary | ICD-10-CM

## 2021-10-04 DIAGNOSIS — C760 Malignant neoplasm of head, face and neck: Secondary | ICD-10-CM

## 2021-10-04 DIAGNOSIS — E119 Type 2 diabetes mellitus without complications: Secondary | ICD-10-CM

## 2021-10-04 DIAGNOSIS — Z79899 Other long term (current) drug therapy: Secondary | ICD-10-CM

## 2021-10-04 DIAGNOSIS — F17210 Nicotine dependence, cigarettes, uncomplicated: Secondary | ICD-10-CM | POA: Insufficient documentation

## 2021-10-04 DIAGNOSIS — C7989 Secondary malignant neoplasm of other specified sites: Secondary | ICD-10-CM

## 2021-10-04 LAB — BUN & CREATININE (CHCC)
BUN: 9 mg/dL (ref 8–23)
Creatinine: 0.64 mg/dL (ref 0.61–1.24)
GFR, Estimated: >60 mL/min (ref >60)

## 2021-10-04 LAB — TSH: TSH: 0.333 u[IU]/mL — ABNORMAL LOW (ref 0.350–4.500)

## 2021-10-04 MED ORDER — SODIUM CHLORIDE 0.9% FLUSH
10.0000 mL | Freq: Once | INTRAVENOUS | Status: AC
  Administered 2021-10-04: 10 mL via INTRAVENOUS

## 2021-10-04 MED ORDER — SODIUM CHLORIDE 0.9% FLUSH: 10.0000 mL | Freq: Once | INTRAVENOUS | Status: DC

## 2021-10-04 NOTE — Progress Notes (Signed)
[place] Work  Initial Assessment   Matthew [name] is a 61 y.o. year old male accompanied by son, [name]. Clinical Social Work was referred by medical provider for assessment of psychosocial needs.   SDOH (Social Determinants of Health) assessments performed: Yes SDOH Interventions    Flowsheet Row Most Recent Value  SDOH Interventions   Transportation Interventions Payor Benefit       SDOH Screenings   Alcohol Screen: Low Risk  (03/23/2021)   Alcohol Screen    Last Alcohol Screening Score (AUDIT): 0  Depression (PHQ2-9): Low Risk  (09/05/2021)   Depression (PHQ2-9)    PHQ-2 Score: 0  Financial Resource Strain: Low Risk  (03/23/2021)   Overall Financial Resource Strain (CARDIA)    Difficulty of Paying Living Expenses: Not very hard  Food Insecurity: Food Insecurity Present (03/23/2021)   Hunger Vital Sign    Worried About Running Out of Food in the Last Year: Sometimes true    Ran Out of Food in the Last Year: Never true  Housing: Low Risk  (03/23/2021)   Housing    Last Housing Risk Score: 0  Physical Activity: Insufficiently Active (03/23/2021)   Exercise Vital Sign    Days of Exercise per Week: 7 days    Minutes of Exercise per Session: 20 min  Social Connections: Socially Isolated (03/23/2021)   Social Connection and Isolation Panel [NHANES]    Frequency of Communication with Friends and Family: More than three times a week    Frequency of Social Gatherings with Friends and Family: More than three times a week    Attends Religious Services: Never    Marine scientist or Organizations: No    Attends Archivist Meetings: Never    Marital Status: Separated  Stress: No Stress Concern Present (03/23/2021)   [employer] [place]    Feeling of Stress : Only a little  Recent Concern: Stress - Stress Concern Present (03/07/2021)   [employer]     Feeling of Stress : To some extent  Tobacco Use: High Risk (09/28/2021)   Patient History    Smoking Tobacco Use: Every Day    Smokeless Tobacco Use: Never    Passive Exposure: Not on file  Transportation Needs: Unmet Transportation Needs (10/04/2021)   PRAPARE - Transportation    Lack of Transportation (Medical): Yes    Lack of Transportation (Non-Medical): No     Distress Screen completed: No     No data to display            Family/Social Information:  Housing Arrangement: patient lives with one of his sons. Other son, [name], lives in [place] Family members/support persons in your life? Family (son- [name]) and Friends Transportation concerns: yes, can't drive himself to daily radiation and [name] unable to since he lives in [place]. Discussed options today  Employment: Legally disabled .  Income source: Banker concerns: Yes, due to illness and/or loss of work during treatment Type of concern: Secretary/administrator access concerns: no Religious or spiritual practice: Not known Services Currently in place:  [employer] & Medicaid, soc security disability  Coping/ Adjustment to diagnosis: Patient understands treatment plan and what happens next? yes Concerns about diagnosis and/or treatment:  How to get to treatment, what will he still be able to physically do Patient reported stressors: Transportation Patient enjoys  mowing his yard Current coping skills/ strengths: Other: supportive  son [name]), disability and insurance in place    SUMMARY: Current SDOH Barriers:  Transportation  Clinical Social Work Clinical Goal(s):  Patient/son will utilize resources to access transportation for medical appointments as directed by SW  Interventions: Informed patient of the support team roles and support services at [facility] Provided [place] contact information and encouraged patient to call with any questions or concerns Provided patient with  information about transportation options [employer], Medicaid, [facility] transport). Son will call to schedule and notify team if there are issues. If unable to get rides set up in advance, may move to [place] with son during treatment Provided information on cancer foundations [employer]er, television/film set, [facility], [place]) that may be able to provide financial assistance   Follow Up Plan: Patient will contact CSW with any support or resource needs Patient/family verbalizes understanding of plan: Yes    Nollan Muldrow E Daesha Insco, LCSW

## 2021-10-04 NOTE — Addendum Note (Signed)
Encounter addended by: Sherrlyn Hock, RN on: 10/04/2021 1:59 PM  Actions taken: [facility] administration accepted

## 2021-10-04 NOTE — Addendum Note (Signed)
Encounter addended by: Sherrlyn Hock, RN on: 10/04/2021 1:58 PM  Actions taken: Order list changed
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
## 2021-10-04 NOTE — Progress Notes (Signed)
Has armband been applied?  Yes.    Does patient have an allergy to IV contrast dye?: No.   Has patient ever received premedication for IV contrast dye?: No.   Date of lab work: 09/08/2021 BUN: 9 CR: 0.64 eGFR: >60  Does patient take metformin?: Yes.    Is eGFR >60?: Yes.   If no, when can patient resume? (Must be 48 hrs AFTER they receive IV contrast):    IV site: forearm left, condition patent and no redness  Has IV site been added to flowsheet?  Yes.      Vitals:   10/04/21 1337  BP: 123/69  Pulse: (!) 55  Resp: 20  Temp: 97.9 F (36.6 C)  SpO2: 97%  Weight: 97.5 kg  Height: _0  (1.753 m)

## 2021-10-04 NOTE — Addendum Note (Signed)
Encounter addended by: Cori Razor, RN on: 10/04/2021 2:32 PM  Actions taken: LDA properties accepted

## 2021-10-04 NOTE — Assessment & Plan Note (Signed)
This is a very pleasant 61 year old male patient with past medical history significant for diabetes, COPD, CVA, chronic anticoagulation, newly diagnosed metastatic cervical lymphadenopathy, primary unknown referred to medical oncology for additional recommendations.  Please refer to the history of presenting illness.  Patient apparently might have noticed the knot in the lymph node for several months prior to presentation. He had imaging and PET/CT which did not show any evidence of metastatic disease. He was evaluated by Dr. Constance Holster without a definitive primary.  According to the discussion of the tumor board, Dr. Constance Holster felt that he is a very high risk for repeat procedures because of need for chronic anticoagulation.  I am not quite entirely sure the indication for chronic anticoagulation although son mentions something about heart disease and risk for recurrent strokes.  Either way the patient is convinced that he is a very high risk for strokes and he can never come off of anticoagulation.  At this time since there is no definitive primary, recommendation per tumor board was to consider radiation.  We have today discussed about role for chemotherapy concurrent with radiation but patient has refused any chemotherapy.  He is at peace with trying radiation for 4 weeks and if he still continues to have cancer, he said he will let it be and he will die from it.  Also it is very difficult for [place] to weigh the at the benefit of chemotherapy exact benefit of chemotherapy without getting the HPV status.  He is also high risk for complications given his other comorbidities.  This point since we are not proceeding with chemotherapy, we will see him as needed.  He is agreeable to all these recommendations.  All his questions were answered to the best of my knowledge

## 2021-10-04 NOTE — Progress Notes (Signed)
Oncology Nurse Navigator Documentation   Met with patient during initial consult with Dr. Chryl Heck. He was accompanied by his son, Matthew [name] Further introduced myself as his/their Navigator, explained my role as a member of the Care Team.   To provide support, encouragement and care continuity, met with Matthew [name] before his CT SIM. He was again accompanied by his son Matthew [name].  He tolerated procedure without difficulty, denied questions/concerns.   I encouraged them to call me prior to his 10/18/21 [facility].   Harlow Asa, RN, BSN, OCN Head & Neck Oncology Nurse [place] at [place] [phone]

## 2021-10-08 ENCOUNTER — Other Ambulatory Visit: Payer: Self-pay | Admitting: Family

## 2021-10-11 ENCOUNTER — Ambulatory Visit: Payer: Medicare HMO

## 2021-10-11 ENCOUNTER — Other Ambulatory Visit: Payer: Self-pay | Admitting: Family

## 2021-10-11 DIAGNOSIS — C760 Malignant neoplasm of head, face and neck: Secondary | ICD-10-CM | POA: Insufficient documentation

## 2021-10-12 ENCOUNTER — Other Ambulatory Visit: Payer: Self-pay

## 2021-10-12 ENCOUNTER — Ambulatory Visit: Payer: Medicare HMO

## 2021-10-12 DIAGNOSIS — C760 Malignant neoplasm of head, face and neck: Secondary | ICD-10-CM | POA: Diagnosis not present

## 2021-10-13 ENCOUNTER — Ambulatory Visit: Payer: Medicare HMO

## 2021-10-14 ENCOUNTER — Ambulatory Visit: Payer: Medicare HMO

## 2021-10-18 ENCOUNTER — Other Ambulatory Visit: Payer: Self-pay

## 2021-10-18 ENCOUNTER — Ambulatory Visit
Admission: RE | Admit: 2021-10-18 | Discharge: 2021-10-18 | Disposition: A | Discharge status: Home / Self Care | Payer: Medicare HMO | Source: Ambulatory Visit | Attending: Radiation Oncology | Admitting: Radiation Oncology

## 2021-10-18 DIAGNOSIS — C760 Malignant neoplasm of head, face and neck: Secondary | ICD-10-CM | POA: Diagnosis not present

## 2021-10-18 DIAGNOSIS — Z510 Encounter for antineoplastic radiation therapy: Secondary | ICD-10-CM | POA: Diagnosis not present

## 2021-10-18 DIAGNOSIS — R6889 Other general symptoms and signs: Secondary | ICD-10-CM | POA: Diagnosis not present

## 2021-10-18 LAB — RAD ONC ARIA SESSION SUMMARY
Course Elapsed Days: 0
Plan Fractions Treated to Date: 1
Plan Prescribed Dose Per Fraction: 2.7 Gy
Plan Total Fractions Prescribed: 20
Plan Total Prescribed Dose: 54 Gy
Reference Point Dosage Given to Date: 2.7 Gy
Reference Point Session Dosage Given: 2.7 Gy
Session Number: 1

## 2021-10-18 MED ORDER — SONAFINE EX EMUL: 1.0000 | Freq: Two times a day (BID) | CUTANEOUS | Status: DC

## 2021-10-18 MED ORDER — SONAFINE EX EMUL
1.0000 | Freq: Two times a day (BID) | CUTANEOUS | Status: DC
  Administered 2021-10-18: 1 via TOPICAL

## 2021-10-18 NOTE — Progress Notes (Signed)
Pt here for patient teaching.  Pt given Radiation and You booklet, skin care instructions, and Sonafine.  Reviewed areas of pertinence such as fatigue, hair loss, mouth changes, skin changes, throat changes, and taste changes . Pt able to give teach back of to pat skin and drink plenty of water,apply Sonafine bid and avoid applying anything to skin within 4 hours of treatment. Pt verbalizes understanding of information given and will contact nursing with any questions or concerns.     [email or web address]

## 2021-10-18 NOTE — Telephone Encounter (Signed)
Received refill request from [facility] in [facility] for   [employer], take 24ms po tid prn for pain  Please review for refill. Lidocaine is on med list.  Last seen 09/27/21  Has follow up 10/23

## 2021-10-19 ENCOUNTER — Telehealth: Payer: Self-pay

## 2021-10-19 ENCOUNTER — Other Ambulatory Visit: Payer: Self-pay

## 2021-10-19 ENCOUNTER — Ambulatory Visit
Admission: RE | Admit: 2021-10-19 | Discharge: 2021-10-19 | Disposition: A | Discharge status: Home / Self Care | Payer: Medicare HMO | Source: Ambulatory Visit | Attending: Radiation Oncology | Admitting: Radiation Oncology

## 2021-10-19 ENCOUNTER — Other Ambulatory Visit: Payer: Self-pay | Admitting: Radiation Oncology

## 2021-10-19 DIAGNOSIS — S02652D Fracture of angle of left mandible, subsequent encounter for fracture with routine healing: Secondary | ICD-10-CM

## 2021-10-19 DIAGNOSIS — C760 Malignant neoplasm of head, face and neck: Secondary | ICD-10-CM | POA: Diagnosis not present

## 2021-10-19 DIAGNOSIS — S02641D Fracture of ramus of right mandible, subsequent encounter for fracture with routine healing: Secondary | ICD-10-CM

## 2021-10-19 DIAGNOSIS — Z510 Encounter for antineoplastic radiation therapy: Secondary | ICD-10-CM | POA: Diagnosis not present

## 2021-10-19 DIAGNOSIS — R6889 Other general symptoms and signs: Secondary | ICD-10-CM | POA: Diagnosis not present

## 2021-10-19 DIAGNOSIS — S0990XD Unspecified injury of head, subsequent encounter: Secondary | ICD-10-CM

## 2021-10-19 LAB — RAD ONC ARIA SESSION SUMMARY
Course Elapsed Days: 1
Plan Fractions Treated to Date: 2
Plan Prescribed Dose Per Fraction: 2.7 Gy
Plan Total Fractions Prescribed: 20
Plan Total Prescribed Dose: 54 Gy
Reference Point Dosage Given to Date: 5.4 Gy
Reference Point Session Dosage Given: 2.7 Gy
Session Number: 2

## 2021-10-19 MED ORDER — HYDROCODONE-ACETAMINOPHEN 7.5-325 MG PO TABS: 1.0000 | ORAL_TABLET | Freq: Four times a day (QID) | ORAL | 0 refills | Status: DC | PRN
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
## 2021-10-19 NOTE — Telephone Encounter (Signed)
Called placed to make patient aware the prescription for pain medication was sent to pharmacy listed in chart. Patient voiced understanding.

## 2021-10-20 ENCOUNTER — Other Ambulatory Visit: Payer: Self-pay

## 2021-10-20 ENCOUNTER — Ambulatory Visit: Payer: Medicare HMO | Attending: Radiation Oncology

## 2021-10-20 ENCOUNTER — Ambulatory Visit
Admission: RE | Admit: 2021-10-20 | Discharge: 2021-10-20 | Disposition: A | Discharge status: Home / Self Care | Payer: Medicare HMO | Source: Ambulatory Visit | Attending: Radiation Oncology | Admitting: Radiation Oncology

## 2021-10-20 ENCOUNTER — Inpatient Hospital Stay: Payer: Medicare HMO | Attending: Hematology and Oncology | Admitting: Licensed Clinical Social Worker

## 2021-10-20 DIAGNOSIS — Z510 Encounter for antineoplastic radiation therapy: Secondary | ICD-10-CM | POA: Diagnosis not present

## 2021-10-20 DIAGNOSIS — R1310 Dysphagia, unspecified: Secondary | ICD-10-CM | POA: Diagnosis not present

## 2021-10-20 DIAGNOSIS — C760 Malignant neoplasm of head, face and neck: Secondary | ICD-10-CM | POA: Diagnosis not present

## 2021-10-20 DIAGNOSIS — R6889 Other general symptoms and signs: Secondary | ICD-10-CM | POA: Diagnosis not present

## 2021-10-20 LAB — RAD ONC ARIA SESSION SUMMARY
Course Elapsed Days: 2
Plan Fractions Treated to Date: 3
Plan Prescribed Dose Per Fraction: 2.7 Gy
Plan Total Fractions Prescribed: 20
Plan Total Prescribed Dose: 54 Gy
Reference Point Dosage Given to Date: 8.1 Gy
Reference Point Session Dosage Given: 2.7 Gy
Session Number: 3

## 2021-10-20 MED ORDER — NYSTATIN 100000 UNIT/ML MT SUSP
10.0000 mL | Freq: Three times a day (TID) | OROMUCOSAL | 1 refills | Status: DC
Start: 2021-10-20 — End: 2021-12-05

## 2021-10-20 NOTE — Progress Notes (Signed)
Oncology Nurse Navigator Documentation   I met with Mr. Riegler before and after his appointment with Garald Balding SLP. He has tolerated his first 3 fractions of radiation. I will meet with him on 9/13 to walk him to his nutrition appointment. He knows to ask for me any day he is here if he has any questions. I will contact his son as needed.  Harlow Asa RN, BSN, OCN Head & Neck Oncology Nurse [place] at [facility] Phone # [phone]  Fax # [phone]

## 2021-10-20 NOTE — Progress Notes (Signed)
[place] CSW Progress Note  Holiday representative met with patient during H&N clinic. Pt reports that the transportation through his insurance is working well (son, [name], helping coordinate). Pt is feeling well so far, glad he is able to stay in his home environment during treatment. He asked about protein drink supplements. CSW informed pt that those will be through the dietitians who he is scheduled to see next week. Informed RN navigator of his question as well. No other needs at this time    [place], LCSW

## 2021-10-21 ENCOUNTER — Other Ambulatory Visit: Payer: Self-pay

## 2021-10-21 ENCOUNTER — Ambulatory Visit
Admission: RE | Admit: 2021-10-21 | Discharge: 2021-10-21 | Disposition: A | Discharge status: Home / Self Care | Payer: Medicare HMO | Source: Ambulatory Visit | Attending: Radiation Oncology | Admitting: Radiation Oncology

## 2021-10-21 DIAGNOSIS — C760 Malignant neoplasm of head, face and neck: Secondary | ICD-10-CM | POA: Diagnosis not present

## 2021-10-21 DIAGNOSIS — R6889 Other general symptoms and signs: Secondary | ICD-10-CM | POA: Diagnosis not present

## 2021-10-21 DIAGNOSIS — Z510 Encounter for antineoplastic radiation therapy: Secondary | ICD-10-CM | POA: Diagnosis not present

## 2021-10-21 LAB — RAD ONC ARIA SESSION SUMMARY
Course Elapsed Days: 3
Plan Fractions Treated to Date: 4
Plan Prescribed Dose Per Fraction: 2.7 Gy
Plan Total Fractions Prescribed: 20
Plan Total Prescribed Dose: 54 Gy
Reference Point Dosage Given to Date: 10.8 Gy
Reference Point Session Dosage Given: 2.7 Gy
Session Number: 4

## 2021-10-24 ENCOUNTER — Ambulatory Visit: Payer: Medicare HMO

## 2021-10-24 ENCOUNTER — Other Ambulatory Visit: Payer: Self-pay

## 2021-10-24 ENCOUNTER — Other Ambulatory Visit: Payer: Self-pay | Admitting: Radiation Oncology

## 2021-10-24 ENCOUNTER — Ambulatory Visit
Admission: RE | Admit: 2021-10-24 | Discharge: 2021-10-24 | Disposition: A | Discharge status: Home / Self Care | Payer: Medicare HMO | Source: Ambulatory Visit | Attending: Radiation Oncology | Admitting: Radiation Oncology

## 2021-10-24 DIAGNOSIS — R6889 Other general symptoms and signs: Secondary | ICD-10-CM | POA: Diagnosis not present

## 2021-10-24 DIAGNOSIS — C760 Malignant neoplasm of head, face and neck: Secondary | ICD-10-CM | POA: Diagnosis not present

## 2021-10-24 DIAGNOSIS — C7989 Secondary malignant neoplasm of other specified sites: Secondary | ICD-10-CM

## 2021-10-24 DIAGNOSIS — Z510 Encounter for antineoplastic radiation therapy: Secondary | ICD-10-CM | POA: Diagnosis not present

## 2021-10-24 LAB — RAD ONC ARIA SESSION SUMMARY
Course Elapsed Days: 6
Plan Fractions Treated to Date: 5
Plan Prescribed Dose Per Fraction: 2.7 Gy
Plan Total Fractions Prescribed: 20
Plan Total Prescribed Dose: 54 Gy
Reference Point Dosage Given to Date: 13.5 Gy
Reference Point Session Dosage Given: 2.7 Gy
Session Number: 5

## 2021-10-24 MED ORDER — FLUCONAZOLE 100 MG PO TABS: ORAL_TABLET | ORAL | 0 refills | Status: DC

## 2021-10-25 ENCOUNTER — Ambulatory Visit
Admission: RE | Admit: 2021-10-25 | Discharge: 2021-10-25 | Disposition: A | Discharge status: Home / Self Care | Payer: Medicare HMO | Source: Ambulatory Visit | Attending: Radiation Oncology | Admitting: Radiation Oncology

## 2021-10-25 ENCOUNTER — Other Ambulatory Visit: Payer: Self-pay

## 2021-10-25 DIAGNOSIS — Z510 Encounter for antineoplastic radiation therapy: Secondary | ICD-10-CM | POA: Diagnosis not present

## 2021-10-25 DIAGNOSIS — C760 Malignant neoplasm of head, face and neck: Secondary | ICD-10-CM | POA: Diagnosis not present

## 2021-10-25 DIAGNOSIS — R6889 Other general symptoms and signs: Secondary | ICD-10-CM | POA: Diagnosis not present

## 2021-10-25 LAB — RAD ONC ARIA SESSION SUMMARY
Course Elapsed Days: 7
Plan Fractions Treated to Date: 6
Plan Prescribed Dose Per Fraction: 2.7 Gy
Plan Total Fractions Prescribed: 20
Plan Total Prescribed Dose: 54 Gy
Reference Point Dosage Given to Date: 16.2 Gy
Reference Point Session Dosage Given: 2.7 Gy
Session Number: 6

## 2021-10-26 ENCOUNTER — Ambulatory Visit
Admission: RE | Admit: 2021-10-26 | Discharge: 2021-10-26 | Disposition: A | Discharge status: Home / Self Care | Payer: Medicare HMO | Source: Ambulatory Visit | Attending: Radiation Oncology | Admitting: Radiation Oncology

## 2021-10-26 ENCOUNTER — Other Ambulatory Visit: Payer: Self-pay

## 2021-10-26 ENCOUNTER — Inpatient Hospital Stay: Payer: Medicare HMO | Admitting: Dietician

## 2021-10-26 DIAGNOSIS — Z510 Encounter for antineoplastic radiation therapy: Secondary | ICD-10-CM | POA: Diagnosis not present

## 2021-10-26 DIAGNOSIS — C760 Malignant neoplasm of head, face and neck: Secondary | ICD-10-CM | POA: Diagnosis not present

## 2021-10-26 DIAGNOSIS — R6889 Other general symptoms and signs: Secondary | ICD-10-CM | POA: Diagnosis not present

## 2021-10-26 LAB — RAD ONC ARIA SESSION SUMMARY
Course Elapsed Days: 8
Plan Fractions Treated to Date: 7
Plan Prescribed Dose Per Fraction: 2.7 Gy
Plan Total Fractions Prescribed: 20
Plan Total Prescribed Dose: 54 Gy
Reference Point Dosage Given to Date: 18.9 Gy
Reference Point Session Dosage Given: 2.7 Gy
Session Number: 7
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
## 2021-10-27 ENCOUNTER — Ambulatory Visit
Admission: RE | Admit: 2021-10-27 | Discharge: 2021-10-27 | Disposition: A | Discharge status: Home / Self Care | Payer: Medicare HMO | Source: Ambulatory Visit | Attending: Radiation Oncology | Admitting: Radiation Oncology

## 2021-10-27 ENCOUNTER — Other Ambulatory Visit: Payer: Self-pay

## 2021-10-27 DIAGNOSIS — R6889 Other general symptoms and signs: Secondary | ICD-10-CM | POA: Diagnosis not present

## 2021-10-27 DIAGNOSIS — C760 Malignant neoplasm of head, face and neck: Secondary | ICD-10-CM | POA: Diagnosis not present

## 2021-10-27 DIAGNOSIS — Z510 Encounter for antineoplastic radiation therapy: Secondary | ICD-10-CM | POA: Diagnosis not present

## 2021-10-27 LAB — RAD ONC ARIA SESSION SUMMARY
Course Elapsed Days: 9
Plan Fractions Treated to Date: 8
Plan Prescribed Dose Per Fraction: 2.7 Gy
Plan Total Fractions Prescribed: 20
Plan Total Prescribed Dose: 54 Gy
Reference Point Dosage Given to Date: 21.6 Gy
Reference Point Session Dosage Given: 2.7 Gy
Session Number: 8

## 2021-10-28 ENCOUNTER — Other Ambulatory Visit: Payer: Self-pay | Admitting: Radiation Oncology

## 2021-10-28 ENCOUNTER — Other Ambulatory Visit: Payer: Self-pay

## 2021-10-28 ENCOUNTER — Ambulatory Visit
Admission: RE | Admit: 2021-10-28 | Discharge: 2021-10-28 | Disposition: A | Discharge status: Home / Self Care | Payer: Medicare HMO | Source: Ambulatory Visit | Attending: Radiation Oncology | Admitting: Radiation Oncology

## 2021-10-28 DIAGNOSIS — S02641D Fracture of ramus of right mandible, subsequent encounter for fracture with routine healing: Secondary | ICD-10-CM

## 2021-10-28 DIAGNOSIS — R6889 Other general symptoms and signs: Secondary | ICD-10-CM | POA: Diagnosis not present

## 2021-10-28 DIAGNOSIS — C760 Malignant neoplasm of head, face and neck: Secondary | ICD-10-CM | POA: Diagnosis not present

## 2021-10-28 DIAGNOSIS — Z510 Encounter for antineoplastic radiation therapy: Secondary | ICD-10-CM | POA: Diagnosis not present

## 2021-10-28 DIAGNOSIS — S0990XD Unspecified injury of head, subsequent encounter: Secondary | ICD-10-CM

## 2021-10-28 DIAGNOSIS — S02652D Fracture of angle of left mandible, subsequent encounter for fracture with routine healing: Secondary | ICD-10-CM

## 2021-10-28 LAB — RAD ONC ARIA SESSION SUMMARY
Course Elapsed Days: 10
Plan Fractions Treated to Date: 9
Plan Prescribed Dose Per Fraction: 2.7 Gy
Plan Total Fractions Prescribed: 20
Plan Total Prescribed Dose: 54 Gy
Reference Point Dosage Given to Date: 24.3 Gy
Reference Point Session Dosage Given: 2.7 Gy
Session Number: 9

## 2021-10-28 MED ORDER — HYDROCODONE-ACETAMINOPHEN 7.5-325 MG PO TABS: 1.0000 | ORAL_TABLET | Freq: Four times a day (QID) | ORAL | 0 refills | Status: DC | PRN

## 2021-10-29 ENCOUNTER — Other Ambulatory Visit: Payer: Self-pay | Admitting: Family

## 2021-10-29 DIAGNOSIS — G500 Trigeminal neuralgia: Secondary | ICD-10-CM

## 2021-10-31 ENCOUNTER — Ambulatory Visit
Admission: RE | Admit: 2021-10-31 | Discharge: 2021-10-31 | Disposition: A | Discharge status: Home / Self Care | Payer: Medicare HMO | Source: Ambulatory Visit | Attending: Radiation Oncology | Admitting: Radiation Oncology

## 2021-10-31 ENCOUNTER — Other Ambulatory Visit: Payer: Self-pay

## 2021-10-31 ENCOUNTER — Other Ambulatory Visit: Payer: Self-pay | Admitting: Radiation Oncology

## 2021-10-31 ENCOUNTER — Ambulatory Visit: Payer: Medicare HMO

## 2021-10-31 DIAGNOSIS — Z510 Encounter for antineoplastic radiation therapy: Secondary | ICD-10-CM | POA: Diagnosis not present

## 2021-10-31 DIAGNOSIS — R6889 Other general symptoms and signs: Secondary | ICD-10-CM | POA: Diagnosis not present

## 2021-10-31 DIAGNOSIS — C760 Malignant neoplasm of head, face and neck: Secondary | ICD-10-CM

## 2021-10-31 LAB — RAD ONC ARIA SESSION SUMMARY
Course Elapsed Days: 13
Plan Fractions Treated to Date: 10
Plan Prescribed Dose Per Fraction: 2.7 Gy
Plan Total Fractions Prescribed: 20
Plan Total Prescribed Dose: 54 Gy
Reference Point Dosage Given to Date: 27 Gy
Reference Point Session Dosage Given: 2.7 Gy
Session Number: 10

## 2021-10-31 MED ORDER — POLYETHYLENE GLYCOL 3350 17 GM/SCOOP PO POWD: ORAL | 2 refills | Status: AC

## 2021-11-01 ENCOUNTER — Other Ambulatory Visit: Payer: Self-pay

## 2021-11-01 ENCOUNTER — Ambulatory Visit
Admission: RE | Admit: 2021-11-01 | Discharge: 2021-11-01 | Disposition: A | Discharge status: Home / Self Care | Payer: Medicare HMO | Source: Ambulatory Visit | Attending: Radiation Oncology | Admitting: Radiation Oncology

## 2021-11-01 DIAGNOSIS — R6889 Other general symptoms and signs: Secondary | ICD-10-CM | POA: Diagnosis not present

## 2021-11-01 DIAGNOSIS — C760 Malignant neoplasm of head, face and neck: Secondary | ICD-10-CM | POA: Diagnosis not present

## 2021-11-01 DIAGNOSIS — Z510 Encounter for antineoplastic radiation therapy: Secondary | ICD-10-CM | POA: Diagnosis not present

## 2021-11-01 LAB — RAD ONC ARIA SESSION SUMMARY
Course Elapsed Days: 14
Plan Fractions Treated to Date: 11
Plan Prescribed Dose Per Fraction: 2.7 Gy
Plan Total Fractions Prescribed: 20
Plan Total Prescribed Dose: 54 Gy
Reference Point Dosage Given to Date: 29.7 Gy
Reference Point Session Dosage Given: 2.7 Gy
Session Number: 11

## 2021-11-02 ENCOUNTER — Ambulatory Visit
Admission: RE | Admit: 2021-11-02 | Discharge: 2021-11-02 | Disposition: A | Discharge status: Home / Self Care | Payer: Medicare HMO | Source: Ambulatory Visit | Attending: Radiation Oncology | Admitting: Radiation Oncology

## 2021-11-02 ENCOUNTER — Other Ambulatory Visit: Payer: Self-pay

## 2021-11-02 ENCOUNTER — Inpatient Hospital Stay: Payer: Medicare HMO | Admitting: Dietician

## 2021-11-02 DIAGNOSIS — Z510 Encounter for antineoplastic radiation therapy: Secondary | ICD-10-CM | POA: Diagnosis not present

## 2021-11-02 DIAGNOSIS — R6889 Other general symptoms and signs: Secondary | ICD-10-CM | POA: Diagnosis not present

## 2021-11-02 DIAGNOSIS — C760 Malignant neoplasm of head, face and neck: Secondary | ICD-10-CM | POA: Diagnosis not present

## 2021-11-02 LAB — RAD ONC ARIA SESSION SUMMARY
Course Elapsed Days: 15
Plan Fractions Treated to Date: 12
Plan Prescribed Dose Per Fraction: 2.7 Gy
Plan Total Fractions Prescribed: 20
Plan Total Prescribed Dose: 54 Gy
Reference Point Dosage Given to Date: 32.4 Gy
Reference Point Session Dosage Given: 2.7 Gy
Session Number: 12

## 2021-11-02 NOTE — Progress Notes (Signed)
Nutrition Follow-up:  Patient with metastatic SCC to cervical lymph node, unknown primary. He is receiving radiation therapy (last treatment planned 10/2).  Met with patient after radiation. He reports tolerating treatment well overall. Patient states he has had one bad day that occurred over the weekend. He reports feeling nauseas and stomach was upset. This has resolved. Patient reports good appetite and "eats right much." He recalls nabs, 2 bologna/cheese sandwiches, and 2 Ensure yesterday. Patient ate more than this, but unable to recall at this time. He reports blood sugar was 204 yesterday afternoon. Patient is concerned this is too high and relates to drinking Ensure. He is drinking one in the morning and one in evening. Patient states is not drinking a whole lot of water. He drinks coffee and there is plenty of water in that. He is rinsing with warm salt water. He does not use baking soda. Patient reports this works well.   Medications: reviewed  Labs: no new labs  Anthropometrics: Last wt 208.4 lb on 9/18 stable  9/11 - 209.2 lb   NUTRITION DIAGNOSIS: Predicted suboptimal intake continues     INTERVENTION:  Continue eating high calorie high protein foods  Recommend patient continue drinking Ensure Plus HP 2x/day - discussed elevated blood sugar mid afternoon more likely due to white bread vs ensure consumed at breakfast Suggested pt add Ensure to coffee vs powder creamer Patient will work to increase intake of water One food bag from [employer] provided    MONITORING, EVALUATION, GOAL: weight trends, intake   NEXT VISIT: Wednesday September 27 after radiation

## 2021-11-02 NOTE — Therapy (Signed)
OUTPATIENT PHYSICAL THERAPY HEAD AND NECK BASELINE EVALUATION   Patient Name: Matthew [name] MRN: [id number] DOB:09-26-1960, 61 y.o., male Today's Date: 11/03/2021   PT End of Session - 11/03/21 0948     Visit Number 1    Number of Visits 2    Date for PT Re-Evaluation 01/12/22    PT Start Time 0843    PT Stop Time 0902    PT Time Calculation (min) 19 min    Activity Tolerance Patient tolerated treatment well    Behavior During Therapy [facility] for tasks assessed/performed             Past Medical History:  Diagnosis Date   Abscess    Anxiety    Aphthous ulcer    Arthritis    Asthma    Chronic bronchitis    Chronic pain    COPD (chronic obstructive pulmonary disease) ([place])    CVA (cerebral vascular accident) ([place])    Depression    Hyperlipidemia    Hypertension    Trigeminal neuralgia    Vertigo    Past Surgical History:  Procedure Laterality Date   CHOLECYSTECTOMY     HAND RECONSTRUCTION Right 1990's   TRIGEMINAL NERVE DECOMPRESSION Right    Patient Active Problem List   Diagnosis Date Noted   Head and neck cancer ([place]) 09/28/2021   Type 2 diabetes mellitus with diabetic polyneuropathy, without long-term current use of insulin ([place]) 05/27/2019   PAF (paroxysmal atrial fibrillation) ([place]) 08/28/2018   Unable to read or write 05/31/2018   Canker sores oral    Bradycardia    Hypomagnesemia    Hypokalemia 03/20/2018   Atrial fibrillation with RVR ([place]) 03/20/2018   Hyponatremia 03/20/2018   DDD (degenerative disc disease), lumbar 11/13/2017   Femoroacetabular impingement of both hips 11/13/2017   Late effect of cerebrovascular accident (CVA) 10/25/2017   H/O: CVA (cerebrovascular accident) 10/22/2017   Tobacco use 10/22/2017   Depression 12/25/2016   Abnormal drug screen 11/02/2016   Pain management contract broken 03/09/2016   Coronary artery calcification 01/18/2016   Thoracic aortic atherosclerosis ([place]) 01/18/2016   Chronic back pain 08/03/2015   Obesity  (BMI 30-39.9) 03/09/2015   Erectile dysfunction 08/24/2014   Vitamin D deficiency 04/18/2014   Hyperlipidemia 04/18/2014   COPD (chronic obstructive pulmonary disease) ([place]) 04/17/2014   Chronic pain syndrome 08/19/2012   Adjustment disorder with mixed anxiety and depressed mood 05/11/2012   Trigeminal neuralgia 05/11/2012   HTN (hypertension) 05/11/2012    PCP: [name], FNP  REFERRING PROVIDER: Eppie Gibson, MD  REFERRING DIAG: C76.0 (ICD-10-CM) - Head and neck cancer ([place])  THERAPY DIAG:  Abnormal posture  Malignant tumor of head and neck ([place])  Rationale for Evaluation and Treatment Rehabilitation  ONSET DATE: 09/12/21  SUBJECTIVE    SUBJECTIVE STATEMENT: Patient reports they are here today to be seen by their medical team for newly diagnosed cancer of head and neck (metastatic squamous cell carcinoma with occult primary).    PERTINENT HISTORY:  Metastatic SCC with occult primary, stage IVB (TX, N3b, M0, p16: unknown, PV: unknown) 08/27/21 Neck CT completed at [facility] ED. The CT revealed an enlarged right level 2 cervical lymph node measuring 2.4 x 2.8 cm. CT also revealed enlarged bilateral tonsils. He was referred to ENT. 09/12/21 Consult with Dr. Constance Holster revealed a 4 cm soft mobile right level 2 node, otherwise no cervical adenopathy or thyroid nodules were appreciated. Indirect laryngoscopy also performed revealed healthy, mobile vocal cords, without mucosal  lesions in the hypopharynx or larynx. Upon record review, Dr. Constance Holster found that the patient had an MRI of the neck performed this past September 2022 which showed a small lymph node. Considering this, Dr. Constance Holster had noted concern for oropharyngeal cancer primary. FNA of the right neck mass collected on 09/12/21 by Dr. Constance Holster revealed findings consistent with keratinizing squamous cell carcinoma. 09/22/21 PET revealing the known nodal metastasis in the upper right neck as intensely hypermetabolic,  corresponding with an enlarged level 2 lymph node. No primary malignancy identified within the pharyngeal mucosal space. PET also showed small axillary lymph nodes bilaterally with low level metabolic activity, likely reactive in etiology, but no definite sites of metastases were appreciated. Chemotherapy was recommended but patient declined. He will receive 20 fractions of radiation. He started on 10/18/21 and will complete on 11/14/21. Pt does not read or write.   PATIENT GOALS   to be educated about the signs and symptoms of lymphedema and learn post op HEP.   PAIN:  Are you having pain? Yes: NPRS scale: pt reports it gets bad (not rated on scale from 0-10)/10 Pain location: jaw Pain description: intermittent Aggravating factors: drinking Relieving factors: numbing solution   PRECAUTIONS: Active CA  WEIGHT BEARING RESTRICTIONS No  FALLS:  Has patient fallen in last 6 months? No Does the patient have a fear of falling that limits activity? No Is the patient reluctant to leave the house due to a fear of falling?No  LIVING ENVIRONMENT: Patient lives with: alone; son is currently staying with him Lives in: Other camper Has following equipment at home: None  OCCUPATION: unemployed, disabled  LEISURE: pt reports he walks and mows  PRIOR LEVEL OF FUNCTION: Independent   OBJECTIVE  COGNITION:           Overall cognitive status: Within functional limits for tasks assessed                  POSTURE:  Forward head and rounded shoulders posture   30 SEC SIT TO STAND: 9 reps in 30 sec with use of UEs which is  Poor for patient's age   SHOULDER AROM:   WFL    CERVICAL AROM:   Percent limited  Flexion WFL  Extension 25% limited  Right lateral flexion WFL  Left lateral flexion WFL  Right rotation WFL  Left rotation WFL    (Blank rows=not tested)   GAIT:  Assessed: Yes Assistance needed: Independent  Ambulation Distance: 10 feet  Assistive Device: none Gait pattern:  Step-through, Decreased step length bilat, Decreased DF bilat, and Poor foot clearance bilat Ambulation surface: Level  PATIENT EDUCATION:  Education details: Neck ROM, importance of posture when sitting, standing and lying down, deep breathing, walking program and importance of staying active throughout treatment, CURE article on staying active, "Why exercise?" flyer, lymphedema and PT info Person educated: Patient Education method: Explanation, Demonstration, Handout Education comprehension: Patient verbalized understanding and returned demonstration   HOME EXERCISE PROGRAM: Patient was instructed today in a home exercise program today for head and neck range of motion exercises. These included active cervical flexion, active cervical extension, active cervical rotation to each direction, upper trap stretch, and shoulder retraction. Patient was encouraged to do these 2-3 times a day, holding for 5 sec each and completing for 5 reps. Pt was educated that once this becomes easier then hold the stretches for 30-60 seconds.    ASSESSMENT:  CLINICAL IMPRESSION: Pt arrives to PT with recently diagnosed metastatic SCC with  occult primary. Pt will undergo radiation. Pt will start treatment on 10/18/21 and complete on 11/14/21.  Pt's cervical ROM was [facility]. Educated pt about signs and symptoms of lymphedema as well as anatomy and physiology of lymphatic system. Educated pt in importance of staying as active as possible throughout treatment to decrease fatigue as well as head and neck ROM exercises to decrease loss of ROM. Will see pt after completion of radiation to reassess ROM and assess for lymphedema and to determine therapy needs at that time.  Pt will benefit from skilled therapeutic intervention to improve on the following deficits: Decreased knowledge of precautions and postural dysfunction.   PT treatment/interventions: ADL/self-care home management, pt/family education, therapeutic exercise.    REHAB POTENTIAL: Good  CLINICAL DECISION MAKING: Stable/uncomplicated  EVALUATION COMPLEXITY: Low   GOALS: Goals reviewed with patient? YES  LONG TERM GOALS: (STG=LTG)   Name Target Date  Goal status  1 Patient will be able to verbalize understanding of a home exercise program for cervical range of motion, posture, and walking.   Baseline:  No knowledge 11/03/2021 Achieved at eval  2 Patient will be able to verbalize understanding of proper sitting and standing posture. Baseline:  No knowledge 11/03/2021 Achieved at eval  3 Patient will be able to verbalize understanding of lymphedema risk and availability of treatment for this condition Baseline:  No knowledge 11/03/2021 Achieved at eval  4 Pt will demonstrate a return to full cervical ROM and function post operatively compared to baselines and not demonstrate any signs or symptoms of lymphedema.  Baseline: See objective measurements taken today. 01/12/2022 New     PLAN: PT FREQUENCY/DURATION: EVAL and 1 follow up appointment.   PLAN FOR NEXT SESSION: *pt does not read or write* will reassess 2 weeks after completion of radiation to determine needs.  Patient will follow up at outpatient cancer rehab 2 weeks after completion of radiation.  If the patient requires physical therapy at that time, a specific plan will be dictated and sent to the referring physician for approval. The patient was educated today on appropriate basic range of motion exercises to begin now and continue throughout radiation and educated on the signs and symptoms of lymphedema. Patient verbalized good understanding.     Physical Therapy Information for During and After Head/Neck Cancer Treatment: Lymphedema is a swelling condition that you may be at risk for in your neck and/or face if you have radiation treatment to the area and/or if you have surgery that includes removing lymph nodes.  There is treatment available for this condition and it is not  life-threatening.  Contact your physician or physical therapist with concerns. An excellent resource for those seeking information on lymphedema is the National Lymphedema Network's website.  It can be accessed at [place].org If you notice swelling in your neck or face at any time following surgery (even if it is many years from now), please contact your doctor or physical therapist to discuss this.  Lymphedema can be treated at any time but it is easier for you if it is treated early on. If you have had surgery to your neck, please check with your surgeon about how soon to start doing neck range of motion exercises.  If you are not having surgery, I encourage you to start doing neck range of motion exercises today and continue these while undergoing treatment, UNLESS you have irritation of your skin or soft tissue that is aggravated by doing them.  These exercises are intended to help  you prevent loss of range of motion and/or to gain range of motion in your neck (which can be limited by tightening effects of radiation), and NOT to aggravate these tissues if they develop sensitivities from treatment. Neck range of motion exercises should be done to the point of feeling a GENTLE, TOLERABLE stretch only.  You are encouraged to start a walking or other exercise program tomorrow and continue this as much as you are able through and after treatment.  Please feel free to call me with any questions. Manus Gunning, PT, CLT Physical Therapist and Certified Lymphedema Therapist [facility] [address], [place], [place] [address] [phone] [email or web address]  WALKING  Walking is a great form of exercise to increase your strength, endurance and overall fitness.  A walking program can help you start slowly and gradually build endurance as you go.  Everyone's ability is different, so each person's starting point will be different.  You do not have to  follow them exactly.  The are just samples. You should simply find out what's right for you and stick to that program.   In the beginning, you'll start off walking 2-3 times a day for short distances.  As you get stronger, you'll be walking further at just 1-2 times per day.  A. You Can Walk For A Certain Length Of Time Each Day    Walk 5 minutes 3 times per day.  Increase 2 minutes every 2 days (3 times per day).  Work up to 25-30 minutes (1-2 times per day).   Example:   Day 1-2 5 minutes 3 times per day   Day 7-8 12 minutes 2-3 times per day   Day 13-14 25 minutes 1-2 times per day  B. You Can Walk For a Certain Distance Each Day     Distance can be substituted for time.    Example:   3 trips to mailbox (at road)   3 trips to corner of block   3 trips around the block  C. Go to local high school and use the track.    Walk for distance ____ around track  Or time ____ minutes  D. Walk ____ Jog ____ Run ___   Why exercise?  So many benefits! Here are SOME of them: Heart health, including raising your good cholesterol level and reducing heart rate and blood pressure Lung health, including improved lung capacity It burns fats, and most of [place] can stand to be leaner, whether or not we are overweight. It increases the body's natural painkillers and mood elevators, so makes you feel better. Not only makes you feel better, but look better too Improves sleep Takes a bite out of stress May decrease your risk of many types of cancer If you are currently undergoing cancer treatment, exercise may improve your ability to tolerate treatments including chemotherapy. For everybody, it can improve your energy level. Those with cancer-related fatigue report a 40-50% reduction in this symptom when exercising regularly. If you are a survivor of breast, colon, or prostate cancer, it may decrease your risk of a recurrence. (This may hold for other cancers too, but so far we have data just for  these three types.)  How to exercise: Get your doctor's okay. Pick something you enjoy doing, like walking, Zumba, biking, swimming, or whatever. Start at low intensity and time, then gradually increase.  (See walking program handout.) Set a goal to achieve over time.  The American Cancer Society, American Heart Association,  and U.S. Dept. of Health and Human Services recommend 150 minutes of moderate exercise, 75 minutes of vigorous exercise, or a combination of both per week. This should be done in episodes at least 10 minutes long, spread throughout the week.  Need help being motivated? Pick something you enjoy doing, because you'll be more inclined to stick with that activity than something that feels like a chore. Do it with a friend so that you are accountable to each other. Schedule it into your day. Place it on your calendar and keep that appointment just like you do any appointment that you make. Join an exercise group that meets at a specific time.  That way, you have to show up on time, and that makes it harder to procrastinate about doing your workout.  It also keeps you accountable--people begin to expect you to be there. Join a gym where you feel comfortable and not intimidated, at the right cost. Sign up for something that you'll need to be in shape for on a specific date, like a 1K or a 5K to walk or run, a 20 or 30 mile bike ride, a mud run or something like that. If the date is looming, you know you'll need to train to be ready for it.  An added benefit is that many of these are fundraisers for good causes. If you've already paid for a gym membership, group exercise class or event, you might as well work out, so you haven't wasted your money!    [facility], PT 11/03/2021, 9:49 AM

## 2021-11-03 ENCOUNTER — Ambulatory Visit
Admission: RE | Admit: 2021-11-03 | Discharge: 2021-11-03 | Disposition: A | Discharge status: Home / Self Care | Payer: Medicare HMO | Source: Ambulatory Visit | Attending: Radiation Oncology | Admitting: Radiation Oncology

## 2021-11-03 ENCOUNTER — Other Ambulatory Visit: Payer: Self-pay

## 2021-11-03 ENCOUNTER — Encounter: Payer: Self-pay | Admitting: Physical Therapy

## 2021-11-03 ENCOUNTER — Ambulatory Visit: Payer: Medicare HMO | Attending: Radiation Oncology | Admitting: Physical Therapy

## 2021-11-03 ENCOUNTER — Ambulatory Visit: Payer: Medicare HMO

## 2021-11-03 DIAGNOSIS — R293 Abnormal posture: Secondary | ICD-10-CM | POA: Diagnosis not present

## 2021-11-03 DIAGNOSIS — R1310 Dysphagia, unspecified: Secondary | ICD-10-CM | POA: Diagnosis not present

## 2021-11-03 DIAGNOSIS — C760 Malignant neoplasm of head, face and neck: Secondary | ICD-10-CM | POA: Diagnosis not present

## 2021-11-03 DIAGNOSIS — R6889 Other general symptoms and signs: Secondary | ICD-10-CM | POA: Diagnosis not present

## 2021-11-03 DIAGNOSIS — Z510 Encounter for antineoplastic radiation therapy: Secondary | ICD-10-CM | POA: Diagnosis not present

## 2021-11-03 LAB — RAD ONC ARIA SESSION SUMMARY
Course Elapsed Days: 16
Plan Fractions Treated to Date: 13
Plan Prescribed Dose Per Fraction: 2.7 Gy
Plan Total Fractions Prescribed: 20
Plan Total Prescribed Dose: 54 Gy
Reference Point Dosage Given to Date: 35.1 Gy
Reference Point Session Dosage Given: 2.7 Gy
Session Number: 13

## 2021-11-03 NOTE — Progress Notes (Signed)
Oncology Nurse Navigator Documentation   I met with Mr. Maffeo today before his new appointment with PT and follow up with SLP in head and neck clinic today. He is tolerating treatment well.   Harlow Asa RN, BSN, OCN Head & Neck Oncology Nurse [place] at [facility] Phone # [phone]  Fax # [phone]

## 2021-11-03 NOTE — Therapy (Signed)
OUTPATIENT SPEECH LANGUAGE PATHOLOGY ONCOLOGY EVALUATION   Patient Name: Matthew [name] MRN: [id number] DOB:01-24-1961, 61 y.o., male Today's Date: 11/03/2021  PCP: Evelina Dun, NP REFERRING PROVIDER: Eppie Gibson, MD   End of Session - 11/03/21 1025     Visit Number 2    Number of Visits 7    Date for SLP Re-Evaluation 01/18/22    SLP Start Time 0915    SLP Stop Time  0935    SLP Time Calculation (min) 20 min    Activity Tolerance Patient tolerated treatment well              Past Medical History:  Diagnosis Date   Abscess    Anxiety    Aphthous ulcer    Arthritis    Asthma    Chronic bronchitis    Chronic pain    COPD (chronic obstructive pulmonary disease) ([place])    CVA (cerebral vascular accident) ([place])    Depression    Hyperlipidemia    Hypertension    Trigeminal neuralgia    Vertigo    Past Surgical History:  Procedure Laterality Date   CHOLECYSTECTOMY     HAND RECONSTRUCTION Right 1990's   TRIGEMINAL NERVE DECOMPRESSION Right    Patient Active Problem List   Diagnosis Date Noted   Head and neck cancer ([place]) 09/28/2021   Type 2 diabetes mellitus with diabetic polyneuropathy, without long-term current use of insulin ([place]) 05/27/2019   PAF (paroxysmal atrial fibrillation) ([place]) 08/28/2018   Unable to read or write 05/31/2018   Canker sores oral    Bradycardia    Hypomagnesemia    Hypokalemia 03/20/2018   Atrial fibrillation with RVR ([place]) 03/20/2018   Hyponatremia 03/20/2018   DDD (degenerative disc disease), lumbar 11/13/2017   Femoroacetabular impingement of both hips 11/13/2017   Late effect of cerebrovascular accident (CVA) 10/25/2017   H/O: CVA (cerebrovascular accident) 10/22/2017   Tobacco use 10/22/2017   Depression 12/25/2016   Abnormal drug screen 11/02/2016   Pain management contract broken 03/09/2016   Coronary artery calcification 01/18/2016   Thoracic aortic atherosclerosis ([place]) 01/18/2016   Chronic back pain 08/03/2015    Obesity (BMI 30-39.9) 03/09/2015   Erectile dysfunction 08/24/2014   Vitamin D deficiency 04/18/2014   Hyperlipidemia 04/18/2014   COPD (chronic obstructive pulmonary disease) ([place]) 04/17/2014   Chronic pain syndrome 08/19/2012   Adjustment disorder with mixed anxiety and depressed mood 05/11/2012   Trigeminal neuralgia 05/11/2012   HTN (hypertension) 05/11/2012    ONSET DATE: early 2023   REFERRING DIAG: Metastatic SCC with occult primary, stage IVB  THERAPY DIAG:  Dysphagia, unspecified type  Rationale for Evaluation and Treatment Rehabilitation  SUBJECTIVE:   SUBJECTIVE STATEMENT: Pt denies any overt s/sx pharyngeal dysphagia. He has a repaired broken rt jaw, with appreciable lump on rt side of neck just below and anterior to rt ramus of mandible. Pt accompanied by: self  PAIN:  Are you having pain? Yes: NPRS scale: 710 Pain location: throat Pain description: sore Aggravating factors: swallowing  LIVING ENVIRONMENT: Lives with: lives alone Lives in: Mobile home  PLOF:  Level of assistance: Independent with ADLs Employment: Other: does not work at this time   PATIENT GOALS: Maintain WNL swallowing  OBJECTIVE:   DIAGNOSTIC FINDINGS: See above in "pertinent history"   COGNITION: Overall cognitive status: Within functional limits for tasks assessed    LANGUAGE: Receptive and Expressive language appeared WNL.Pt has limited literacy skills.    ORAL MOTOR EXAMINATION Overall status: [facility]   MOTOR  SPEECH: Overall motor speech: Appears intact   CLINICAL SWALLOW ASSESSMENT:   Current diet: soft foods and thin liquids due to healing broken rt jaw Dentition: endentulous Patient directly observed with POs: Yes: dysphagia 3 (soft), and thin liquids  Feeding: able to feed self Liquids provided by: cup Oral phase signs and symptoms:  none noted today; pt had to tear off bites of sandwich due to broken jaw  Pharyngeal phase signs and symptoms:  none noted today      TODAY'S TREATMENT:  11/03/21: Pt told SLP he has been completing chin tuck against resistance exercise (not provided by SLP at eval), "at least 40 times a day - it helps my broken jaw". SLP explained that HEP was for swallowing musculature and reiterated rationale for HEP completion with pt, as pt was unable to tell SLP rationale for HEP. He req'd usual min A for correct completion of effortful swallow, Masako, Mendelsohn, and supraglottic swallow. He swallowed water and [facility] without overt s/sx oral or pharyngeal deficits. He is eating softer foods without overt s/sx deficits reported.   10/20/21: Research states the risk for dysphagia increases due to radiation and chemotherapy treatment due to a variety of factors, so SLP educated the pt about the possibility of reduced/limited ability for PO intake during rad tx. SLP also educated pt regarding possible changes to swallowing musculature after rad tx, and why adherence to dysphagia HEP provided today and PO consumption was necessary to inhibit muscle fibrosis following rad tx. SLP informed pt why this would be detrimental to his swallowing status and to their pulmonary health. Pt demonstrated understanding of these things to SLP. SLP encouraged pt to safely eat and drink as deep into their radiation/chemotherapy as possible to provide the best possible long-term swallowing outcome for pt.    SLP then developed a simplified (due to limited literacy skills) 3-exercise individualized HEP for pt involving oral and pharyngeal strengthening and ROM and pt was instructed how to perform these exercises, including SLP demonstration. After SLP demonstration, pt return demonstrated each exercise. SLP ensured pt performance was correct prior to educating pt on next exercise. Pt required rare min cues faded to modified independent to perform HEP. Xxavier was instructed to complete this program at least 5 days/week, at least 2 times a day until 05/14/21, and then x2 a  week after that, indefinitely.     PATIENT EDUCATION: Education details: late effects head/neck radiation on swallow function, HEP procedure, Person educated: Patient Education method: Explanation, Demonstration, Verbal cues Education comprehension: verbalized understanding, returned demonstration, verbal cues required, and needs further education     ASSESSMENT:   CLINICAL IMPRESSION: Patient is a 61y/o male who was seen today for treatment of swallowing as they undergo radiation therapy. Today pt drank thin liquids and politely refused solids. At this time pt swallowing is deemed WNL/WFL with these POs. No oral deficits or overt s/sx pharyngeal deficits, including aspiration, were observed today. There are no overt s/s aspiration PNA observed by SLP nor any reported by pt at this time. Data indicate that pt's swallow ability will likely decrease over the course of radiation therapy and could very well decline over time following the conclusion of that therapy due to muscle disuse atrophy and/or muscle fibrosis. Pt will cont to need to be seen by SLP in order to assess safety of PO intake, assess the need for recommending any objective swallow assessment, and ensuring pt is correctly completing the individualized HEP.   OBJECTIVE IMPAIRMENTS include dysphagia.  These impairments are limiting patient from safety when swallowing. Factors affecting potential to achieve goals and functional outcome are limited/reduced literacy . Patient will benefit from skilled SLP services to address above impairments and improve overall function.   REHAB POTENTIAL: Good   GOALS: Goals reviewed with patient? No   SHORT TERM GOALS: Target date:  2 therapy visits (visit #3)       pt will complete HEP with rare min A  Baseline: Goal status: Ongoing   2.  pt will tell SLP why pt is completing HEP with modified independence Baseline:  Goal status: Ongoing   3.  pt will describe 3 overt s/s aspiration PNA  with modified independence Baseline:  Goal status: Ongoing     LONG TERM GOALS: Target date:  6 therapy visits (visit #7)      pt will complete HEP with modified independnence in 2 sessions Baseline:  Goal status: Ongoing   2.  pt will describe how to modify HEP over time, and the timeline associated with reduction in HEP frequency with modified independence over two sessions Baseline:  Goal status: Ongoing     PLAN: SLP FREQUENCY:  once every approx 2 weeks   SLP DURATION:  7 total sessions   PLANNED INTERVENTIONS: Aspiration precaution training, Pharyngeal strengthening exercises, Diet toleration management , Trials of upgraded texture/liquids, Internal/external aids, SLP instruction and feedback, Compensatory strategies, and Patient/family education   [facility], [place] 11/03/2021, 10:26 AM

## 2021-11-04 ENCOUNTER — Ambulatory Visit
Admission: RE | Admit: 2021-11-04 | Discharge: 2021-11-04 | Disposition: A | Discharge status: Home / Self Care | Payer: Medicare HMO | Source: Ambulatory Visit | Attending: Radiation Oncology | Admitting: Radiation Oncology

## 2021-11-04 ENCOUNTER — Other Ambulatory Visit: Payer: Self-pay

## 2021-11-04 DIAGNOSIS — C760 Malignant neoplasm of head, face and neck: Secondary | ICD-10-CM | POA: Diagnosis not present

## 2021-11-04 DIAGNOSIS — Z510 Encounter for antineoplastic radiation therapy: Secondary | ICD-10-CM | POA: Diagnosis not present

## 2021-11-04 DIAGNOSIS — R6889 Other general symptoms and signs: Secondary | ICD-10-CM | POA: Diagnosis not present

## 2021-11-04 LAB — RAD ONC ARIA SESSION SUMMARY
Course Elapsed Days: 17
Plan Fractions Treated to Date: 14
Plan Prescribed Dose Per Fraction: 2.7 Gy
Plan Total Fractions Prescribed: 20
Plan Total Prescribed Dose: 54 Gy
Reference Point Dosage Given to Date: 37.8 Gy
Reference Point Session Dosage Given: 2.7 Gy
Session Number: 14

## 2021-11-07 ENCOUNTER — Other Ambulatory Visit: Payer: Self-pay | Admitting: Radiation Oncology

## 2021-11-07 ENCOUNTER — Ambulatory Visit: Payer: Medicare HMO

## 2021-11-07 ENCOUNTER — Other Ambulatory Visit: Payer: Self-pay

## 2021-11-07 ENCOUNTER — Ambulatory Visit
Admission: RE | Admit: 2021-11-07 | Discharge: 2021-11-07 | Disposition: A | Discharge status: Home / Self Care | Payer: Medicare HMO | Source: Ambulatory Visit | Attending: Radiation Oncology | Admitting: Radiation Oncology

## 2021-11-07 DIAGNOSIS — R6889 Other general symptoms and signs: Secondary | ICD-10-CM | POA: Diagnosis not present

## 2021-11-07 DIAGNOSIS — S02652D Fracture of angle of left mandible, subsequent encounter for fracture with routine healing: Secondary | ICD-10-CM

## 2021-11-07 DIAGNOSIS — Z510 Encounter for antineoplastic radiation therapy: Secondary | ICD-10-CM | POA: Diagnosis not present

## 2021-11-07 DIAGNOSIS — C760 Malignant neoplasm of head, face and neck: Secondary | ICD-10-CM | POA: Diagnosis not present

## 2021-11-07 DIAGNOSIS — S02641D Fracture of ramus of right mandible, subsequent encounter for fracture with routine healing: Secondary | ICD-10-CM

## 2021-11-07 DIAGNOSIS — S0990XD Unspecified injury of head, subsequent encounter: Secondary | ICD-10-CM

## 2021-11-07 LAB — RAD ONC ARIA SESSION SUMMARY
Course Elapsed Days: 20
Plan Fractions Treated to Date: 15
Plan Prescribed Dose Per Fraction: 2.7 Gy
Plan Total Fractions Prescribed: 20
Plan Total Prescribed Dose: 54 Gy
Reference Point Dosage Given to Date: 40.5 Gy
Reference Point Session Dosage Given: 2.7 Gy
Session Number: 15

## 2021-11-07 MED ORDER — HYDROCODONE-ACETAMINOPHEN 7.5-325 MG PO TABS: 1.0000 | ORAL_TABLET | Freq: Four times a day (QID) | ORAL | 0 refills | Status: DC | PRN

## 2021-11-08 ENCOUNTER — Other Ambulatory Visit: Payer: Self-pay

## 2021-11-08 ENCOUNTER — Ambulatory Visit: Payer: Medicare HMO

## 2021-11-08 ENCOUNTER — Ambulatory Visit
Admission: RE | Admit: 2021-11-08 | Discharge: 2021-11-08 | Disposition: A | Discharge status: Home / Self Care | Payer: Medicare HMO | Source: Ambulatory Visit | Attending: Radiation Oncology | Admitting: Radiation Oncology

## 2021-11-08 DIAGNOSIS — R6889 Other general symptoms and signs: Secondary | ICD-10-CM | POA: Diagnosis not present

## 2021-11-08 DIAGNOSIS — Z510 Encounter for antineoplastic radiation therapy: Secondary | ICD-10-CM | POA: Diagnosis not present

## 2021-11-08 DIAGNOSIS — C760 Malignant neoplasm of head, face and neck: Secondary | ICD-10-CM | POA: Diagnosis not present

## 2021-11-08 LAB — RAD ONC ARIA SESSION SUMMARY
Course Elapsed Days: 21
Plan Fractions Treated to Date: 16
Plan Prescribed Dose Per Fraction: 2.7 Gy
Plan Total Fractions Prescribed: 20
Plan Total Prescribed Dose: 54 Gy
Reference Point Dosage Given to Date: 43.2 Gy
Reference Point Session Dosage Given: 2.7 Gy
Session Number: 16

## 2021-11-09 ENCOUNTER — Ambulatory Visit
Admission: RE | Admit: 2021-11-09 | Discharge: 2021-11-09 | Disposition: A | Discharge status: Home / Self Care | Payer: Medicare HMO | Source: Ambulatory Visit | Attending: Radiation Oncology | Admitting: Radiation Oncology

## 2021-11-09 ENCOUNTER — Other Ambulatory Visit: Payer: Self-pay

## 2021-11-09 ENCOUNTER — Inpatient Hospital Stay: Payer: Medicare HMO | Admitting: Dietician

## 2021-11-09 ENCOUNTER — Other Ambulatory Visit: Payer: Self-pay | Admitting: Radiation Oncology

## 2021-11-09 DIAGNOSIS — C760 Malignant neoplasm of head, face and neck: Secondary | ICD-10-CM | POA: Diagnosis not present

## 2021-11-09 DIAGNOSIS — C7989 Secondary malignant neoplasm of other specified sites: Secondary | ICD-10-CM

## 2021-11-09 DIAGNOSIS — Z510 Encounter for antineoplastic radiation therapy: Secondary | ICD-10-CM | POA: Diagnosis not present

## 2021-11-09 DIAGNOSIS — R6889 Other general symptoms and signs: Secondary | ICD-10-CM | POA: Diagnosis not present

## 2021-11-09 LAB — RAD ONC ARIA SESSION SUMMARY
Course Elapsed Days: 22
Plan Fractions Treated to Date: 17
Plan Prescribed Dose Per Fraction: 2.7 Gy
Plan Total Fractions Prescribed: 20
Plan Total Prescribed Dose: 54 Gy
Reference Point Dosage Given to Date: 45.9 Gy
Reference Point Session Dosage Given: 2.7 Gy
Session Number: 17

## 2021-11-09 MED ORDER — HYDROCODONE-ACETAMINOPHEN 5-325 MG PO TABS: 1.0000 | ORAL_TABLET | Freq: Four times a day (QID) | ORAL | 0 refills | Status: DC | PRN

## 2021-11-09 NOTE — Progress Notes (Signed)
Nutrition Follow-up:  Patient with metastatic SCC to cervical lymph node, unknown primary. He is receiving radiation therapy (last treatment planned 10/2).  Met with patient after radiation. Patient reports jaw pain. His mouth and throat are sore. Food taste nasty. He is eating "whatever I can."  Recalls eggs and corn beef hash, but can not recall what else he ate yesterday. He is drinking "as much water as possible." Patient is drinking 2 Ensure Plus HP. Patient requesting additional case of Ensure and bag of food today. He is doing salt water rinses several times daily.    Medications: reviewed   Labs: no new labs for review  Anthropometrics: Wt 204.4 lb on 9/25 decreased from 208.4 lb on 9/18   - 1.9% in one week, significant  NUTRITION DIAGNOSIS: Predicted suboptimal intake has evolved into inadequate oral intake related to cancer and associated treatment side effects as evidenced by sore mouth and throat, altered taste, 1.9% (4 lb) wt loss in one week; significant   INTERVENTION:  Encouraged small amounts of soft moist foods more frequently One bag of food from [employer] provided  Pt agreeable to increasing Ensure to TID One complimentary case of Ensure Plus HP provided as well as 2 samples of Boost VHC Continue salt water rinses several times daily and before meals   MONITORING, EVALUATION, GOAL: weight trends, intake    NEXT VISIT: Monday October 2 after final radiation with [facility]

## 2021-11-09 NOTE — Progress Notes (Signed)
This morning patient requested that new pain medication prescription be sent because his pharmacy did not have the 7.5-325 strength of hydrocodone in stock.   Relayed message to Dr. Isidore Moos who sent in prescription for 5-325 to patient's preferred pharmacy around 13:00 today. Called and spoke with pharmacist at [facility] around 13:20 to cancel previous 7.5-325 since new prescription was sent. Pharmacist stated 7.5-325 came in stock and patient paid to have that prescription filled this morning. Gave verbal orders to cancel new 5-325 strength prescription. Pharmacist verbalized understanding and agreement.

## 2021-11-10 ENCOUNTER — Other Ambulatory Visit: Payer: Self-pay

## 2021-11-10 ENCOUNTER — Ambulatory Visit
Admission: RE | Admit: 2021-11-10 | Discharge: 2021-11-10 | Disposition: A | Discharge status: Home / Self Care | Payer: Medicare HMO | Source: Ambulatory Visit | Attending: Radiation Oncology | Admitting: Radiation Oncology

## 2021-11-10 DIAGNOSIS — R6889 Other general symptoms and signs: Secondary | ICD-10-CM | POA: Diagnosis not present

## 2021-11-10 DIAGNOSIS — Z510 Encounter for antineoplastic radiation therapy: Secondary | ICD-10-CM | POA: Diagnosis not present

## 2021-11-10 DIAGNOSIS — C760 Malignant neoplasm of head, face and neck: Secondary | ICD-10-CM | POA: Diagnosis not present

## 2021-11-10 LAB — RAD ONC ARIA SESSION SUMMARY
Course Elapsed Days: 23
Plan Fractions Treated to Date: 18
Plan Prescribed Dose Per Fraction: 2.7 Gy
Plan Total Fractions Prescribed: 20
Plan Total Prescribed Dose: 54 Gy
Reference Point Dosage Given to Date: 48.6 Gy
Reference Point Session Dosage Given: 2.7 Gy
Session Number: 18

## 2021-11-11 ENCOUNTER — Other Ambulatory Visit: Payer: Self-pay

## 2021-11-11 ENCOUNTER — Ambulatory Visit
Admission: RE | Admit: 2021-11-11 | Discharge: 2021-11-11 | Disposition: A | Discharge status: Home / Self Care | Payer: Medicare HMO | Source: Ambulatory Visit | Attending: Radiation Oncology | Admitting: Radiation Oncology

## 2021-11-11 DIAGNOSIS — C760 Malignant neoplasm of head, face and neck: Secondary | ICD-10-CM | POA: Diagnosis not present

## 2021-11-11 DIAGNOSIS — Z510 Encounter for antineoplastic radiation therapy: Secondary | ICD-10-CM | POA: Diagnosis not present

## 2021-11-11 LAB — RAD ONC ARIA SESSION SUMMARY
Course Elapsed Days: 24
Plan Fractions Treated to Date: 19
Plan Prescribed Dose Per Fraction: 2.7 Gy
Plan Total Fractions Prescribed: 20
Plan Total Prescribed Dose: 54 Gy
Reference Point Dosage Given to Date: 51.3 Gy
Reference Point Session Dosage Given: 2.7 Gy
Session Number: 19

## 2021-11-14 ENCOUNTER — Inpatient Hospital Stay: Payer: Medicare HMO | Attending: Hematology and Oncology

## 2021-11-14 ENCOUNTER — Other Ambulatory Visit: Payer: Self-pay

## 2021-11-14 ENCOUNTER — Encounter: Payer: Self-pay | Admitting: Radiation Oncology

## 2021-11-14 ENCOUNTER — Ambulatory Visit
Admission: RE | Admit: 2021-11-14 | Discharge: 2021-11-14 | Disposition: A | Discharge status: Home / Self Care | Payer: Medicare HMO | Source: Ambulatory Visit | Attending: Radiation Oncology | Admitting: Radiation Oncology

## 2021-11-14 ENCOUNTER — Ambulatory Visit: Payer: Medicare HMO

## 2021-11-14 DIAGNOSIS — C801 Malignant (primary) neoplasm, unspecified: Secondary | ICD-10-CM | POA: Diagnosis not present

## 2021-11-14 DIAGNOSIS — C7989 Secondary malignant neoplasm of other specified sites: Secondary | ICD-10-CM | POA: Diagnosis not present

## 2021-11-14 DIAGNOSIS — C760 Malignant neoplasm of head, face and neck: Secondary | ICD-10-CM | POA: Diagnosis not present

## 2021-11-14 DIAGNOSIS — R6889 Other general symptoms and signs: Secondary | ICD-10-CM | POA: Diagnosis not present

## 2021-11-14 DIAGNOSIS — Z510 Encounter for antineoplastic radiation therapy: Secondary | ICD-10-CM | POA: Diagnosis not present

## 2021-11-14 LAB — RAD ONC ARIA SESSION SUMMARY
Course Elapsed Days: 27
Plan Fractions Treated to Date: 20
Plan Prescribed Dose Per Fraction: 2.7 Gy
Plan Total Fractions Prescribed: 20
Plan Total Prescribed Dose: 54 Gy
Reference Point Dosage Given to Date: 54 Gy
Reference Point Session Dosage Given: 2.7 Gy
Session Number: 20

## 2021-11-14 MED ORDER — SONAFINE EX EMUL
1.0000 | Freq: Once | CUTANEOUS | Status: AC
  Administered 2021-11-14: 1 via TOPICAL

## 2021-11-14 NOTE — Progress Notes (Signed)
Nutrition  Patient did not come to scheduled nutrition follow-up visit today.  Will send message to scheduling to offer another appointment  Matthew [name] B. Zenia Resides, [place], [place] Registered Dietitian [phone]

## 2021-11-18 ENCOUNTER — Other Ambulatory Visit: Payer: Self-pay | Admitting: Radiation Oncology

## 2021-11-18 DIAGNOSIS — C7989 Secondary malignant neoplasm of other specified sites: Secondary | ICD-10-CM

## 2021-11-18 MED ORDER — HYDROCODONE-ACETAMINOPHEN 5-325 MG PO TABS: 1.0000 | ORAL_TABLET | Freq: Four times a day (QID) | ORAL | 0 refills | Status: DC | PRN

## 2021-11-21 ENCOUNTER — Other Ambulatory Visit: Payer: Self-pay | Admitting: Family

## 2021-11-21 DIAGNOSIS — J441 Chronic obstructive pulmonary disease with (acute) exacerbation: Secondary | ICD-10-CM

## 2021-11-21 NOTE — Progress Notes (Signed)
                                                                                                                                                       Patient Name: Matthew [name] MRN: [id number] DOB: 06-17-1960 Referring Physician: [name] (Profile Not Attached) Date of Service: 11/14/2021 [facility], [place]                                                        End Of Treatment Note  Diagnoses: C76.0-Malignant neoplasm of head, face and neck C79.89-Secondary malignant neoplasm of other specified sites  Cancer Staging:  Cancer Staging  Head and neck cancer ([place]) Staging form: Pharynx - P16 Negative Oropharynx, AJCC 8th Edition - Clinical stage from 09/27/2021: Stage IVB (cTX, cN3b, cM0, p16: Unknown, HPV: Unknown) - Signed by Eppie Gibson, MD on 09/28/2021 Stage prefix: Initial diagnosis  Intent: Curative  Radiation Treatment Dates: 10/18/2021 through 11/14/2021 Site Technique Total Dose (Gy) Dose per Fx (Gy) Completed Fx Beam Energies  Neck: HN_orophar IMRT 54/54 2.7 20/20 6X   Narrative: The patient tolerated radiation therapy relatively well with clinical regression of his adenopathy.  Plan: The patient will follow-up with radiation oncology in 2-3wks. -----------------------------------  Eppie Gibson, MD

## 2021-11-29 ENCOUNTER — Ambulatory Visit
Admission: RE | Admit: 2021-11-29 | Discharge: 2021-11-29 | Disposition: A | Discharge status: Home / Self Care | Payer: Medicare HMO | Source: Ambulatory Visit | Attending: Radiation Oncology | Admitting: Radiation Oncology

## 2021-11-29 ENCOUNTER — Encounter: Payer: Self-pay | Admitting: Radiation Oncology

## 2021-11-29 VITALS — BP 138/85 | HR 51 | Temp 98.1°F | Resp 18 | Ht 69.0 in | Wt 205.0 lb

## 2021-11-29 DIAGNOSIS — R6889 Other general symptoms and signs: Secondary | ICD-10-CM | POA: Diagnosis not present

## 2021-11-29 DIAGNOSIS — C760 Malignant neoplasm of head, face and neck: Secondary | ICD-10-CM | POA: Diagnosis not present

## 2021-11-29 DIAGNOSIS — C801 Malignant (primary) neoplasm, unspecified: Secondary | ICD-10-CM | POA: Diagnosis not present

## 2021-11-29 DIAGNOSIS — C7989 Secondary malignant neoplasm of other specified sites: Secondary | ICD-10-CM | POA: Diagnosis not present

## 2021-11-29 MED ORDER — HYDROCODONE-ACETAMINOPHEN 5-325 MG PO TABS: 1.0000 | ORAL_TABLET | Freq: Four times a day (QID) | ORAL | 0 refills | Status: DC | PRN

## 2021-11-29 NOTE — Progress Notes (Signed)
Matthew [name] presents today for follow-up after completing radiation to his oropharynx on 11/14/2021  Pain issues, if any: Continued to deal with mouth and upper throat discomfort Using a feeding tube?: N/A Weight changes, if any:  Wt Readings from Last 3 Encounters:  11/29/21 205 lb (93 kg)  10/04/21 215 lb (97.5 kg)  10/04/21 215 lb (97.5 kg)   Swallowing issues, if any: Denies. Reports he's able to eat and drink a wide variety (especially since his taste has returned some) Smoking or chewing tobacco? Continues to smoke cigarettes  Using fluoride trays daily? N/A Last ENT visit was on: Not since diagnosis Other notable issues, if any: Reports dry mouth comes and goes, but he's managing with salt water rinses. Denies any ear or jaw pain, or difficulty opening his mouth. Skin in treatment field is still reddened, but appears intact. Believes he was stung by an insect due to a new bump that has appeared to his left temple.

## 2021-11-29 NOTE — Progress Notes (Signed)
Radiation Oncology         [phone] ________________________________  Name: Matthew [name] MRN: [id number]  Date: 11/29/2021  DOB: 05-17-1960  Follow-Up Visit Note  CC: [name], FNP  [name], MD  Diagnosis and Prior Radiotherapy:       ICD-10-CM   1. Head and neck cancer (HCC)  C76.0 NM PET Image Restag (PS) Skull Base To Thigh    2. Metastatic squamous neck cancer with occult primary (HCC)  C79.89 HYDROcodone-acetaminophen (NORCO/VICODIN) 5-325 MG tablet   C80.1       Cancer Staging  Head and neck cancer ([place]) Staging form: Pharynx - P16 Negative Oropharynx, AJCC 8th Edition - Clinical stage from 09/27/2021: Stage IVB (cTX, cN3b, cM0, p16: Unknown, HPV: Unknown) - Signed by Eppie Gibson, MD on 09/28/2021 Stage prefix: Initial diagnosis   Radiation Treatment Dates: 10/18/2021 through 11/14/2021 Site Technique Total Dose (Gy) Dose per Fx (Gy) Completed Fx Beam Energies  Neck: HN_orophar IMRT 54/54 2.7 20/20 6X   CHIEF COMPLAINT:  Here for follow-up and surveillance of cancer  Narrative:  The patient returns today for routine follow-up.  Matthew [name] presents today for follow-up after completing radiation to his oropharynx on 11/14/2021  Pain issues, if any: Continued to deal with mouth and upper throat discomfort Using a feeding tube?: N/A Weight changes, if any:  Wt Readings from Last 3 Encounters:  11/29/21 205 lb (93 kg)  10/04/21 215 lb (97.5 kg)  10/04/21 215 lb (97.5 kg)   Swallowing issues, if any: Denies. Reports he's able to eat and drink a wide variety (especially since his taste has returned some) Smoking or chewing tobacco? Continues to smoke cigarettes and does not seem motivated to quit right now Using fluoride trays daily? N/A Last ENT visit was on: Not since diagnosis Other notable issues, if any: Reports dry mouth comes and goes, but he's managing with salt water rinses. Denies any ear or jaw pain, or difficulty opening his mouth. Skin in  treatment field is still reddened, but appears intact. Believes he was stung by an insect due to a new bump that has appeared to his left temple.  He reports a history of boils which are fairly common over his skin and responds well to antibiotics prescribed by his primary doctor                      ALLERGIES:  is allergic to penicillins.  Meds: Current Outpatient Medications  Medication Sig Dispense Refill   albuterol (VENTOLIN HFA) 108 (90 Base) MCG/ACT inhaler TAKE 2 PUFFS BY MOUTH EVERY 6 HOURS AS NEEDED FOR WHEEZE OR SHORTNESS OF BREATH 18 each 4   amLODipine (NORVASC) 10 MG tablet Take 1 tablet (10 mg total) by mouth daily. (NEEDS TO BE SEEN BEFORE NEXT REFILL) 90 tablet 3   apixaban (ELIQUIS) 5 MG TABS tablet TAKE 1 TABLET BY MOUTH TWICE A DAY **EMERGENCY FILL - PT LEFT IN [place]** 60 tablet 0   atorvastatin (LIPITOR) 40 MG tablet TAKE 1 TABLET BY MOUTH EVERY DAY 90 tablet 0   baclofen (LIORESAL) 10 MG tablet TAKE 1 TABLET BY MOUTH THREE TIMES A DAY AS NEEDED FOR MUSCLE SPASMS 270 tablet 0   blood glucose meter kit and supplies Dispense based on patient and insurance preference. Use up to four times daily as directed. (FOR ICD-10 E10.9, E11.9). 1 each 0   carvedilol (COREG) 3.125 MG tablet TAKE 1 TABLET BY MOUTH TWICE A DAY WITH MEALS  180 tablet 1   DULoxetine (CYMBALTA) 60 MG capsule Take 1 capsule (60 mg total) by mouth daily. (NEEDS TO BE SEEN BEFORE NEXT REFILL) 90 capsule 3   fluconazole (DIFLUCAN) 100 MG tablet Take 2 tabs today, then 1 tab daily for 20 more days. Hold Atorvastatin while on this. 22 tablet 0   fluticasone (FLONASE) 50 MCG/ACT nasal spray SPRAY 2 SPRAYS INTO EACH NOSTRIL EVERY DAY 48 mL 0   gabapentin (NEURONTIN) 800 MG tablet TAKE 1 TABLET IN THE MORNING, AT NOON, IN THE EVENING, AND AT BEDTIME. (NEEDS APPT FOR REFILLS) 120 tablet 3   glucose blood test strip Test BS daily Dx E11.65 100 each 3   HYDROcodone-acetaminophen (NORCO/VICODIN) 5-325 MG tablet Take 1  tablet by mouth every 6 (six) hours as needed for moderate pain. 30 tablet 0   lidocaine (XYLOCAINE) 2 % solution SMARTSIG:By Mouth     lisinopril (ZESTRIL) 5 MG tablet TAKE 1 TABLET (5 MG TOTAL) BY MOUTH DAILY. 90 tablet 0   loratadine (CLARITIN) 10 MG tablet Take 1 tablet (10 mg total) by mouth daily. 90 tablet 3   magic mouthwash (nystatin, lidocaine, diphenhydrAMINE, alum & mag hydroxide) suspension Swish and spit 10 mLs 3 (three) times daily. 180 mL 1   metFORMIN (GLUCOPHAGE) 500 MG tablet Take 1 tablet (500 mg total) by mouth daily with breakfast. 90 tablet 4   OXcarbazepine (TRILEPTAL) 150 MG tablet TAKE 1 TABLET BY MOUTH TWICE A DAY 180 tablet 5   polyethylene glycol powder (GLYCOLAX/MIRALAX) 17 GM/SCOOP powder Stir and dissolve 17g of powder in any 4 to 8 ounces of beverage (cold, hot or room temperature), then drink. Take 1-2 times daily to prevent constipation. 238 g 2   TRELEGY ELLIPTA 100-62.5-25 MCG/ACT AEPB TAKE 1 PUFF BY MOUTH EVERY DAY 60 each 11   Vitamin D, Ergocalciferol, (DRISDOL) 1.25 MG (50000 UNIT) CAPS capsule Take 1 capsule (50,000 Units total) by mouth every 7 (seven) days. 12 capsule 3   No current facility-administered medications for this encounter.    Physical Findings: The patient is in no acute distress. Patient is alert and oriented. Wt Readings from Last 3 Encounters:  11/29/21 205 lb (93 kg)  10/04/21 215 lb (97.5 kg)  10/04/21 215 lb (97.5 kg)    height is $RemoveB'5\' 9"'JNTLyhvi$  (1.753 m) and weight is 205 lb (93 kg). His oral temperature is 98.1 F (36.7 C). His blood pressure is 138/85 and his pulse is 51 (abnormal). His respiration is 18 and oxygen saturation is 98%. .  General: Alert and oriented, in no acute distress HEENT: Head is normocephalic. Extraocular movements are intact. Oropharynx is notable for no lesions.  Neck: Neck is notable for resolving adenopathy and right level 2 of neck.  The mass has flattened nicely with mild firmness remaining.  In the left  temple, just above his parotid gland, there is subcutaneous erythema and swelling that appears inflammatory or infectious in nature. Skin: Skin in treatment fields shows satisfactory healing with residual dry desquamation and erythema MSK: Ambulatory.  Well-nourished.   Lab Findings: Lab Results  Component Value Date   WBC 10.0 08/30/2021   HGB 14.4 08/30/2021   HCT 42.5 08/30/2021   MCV 87 08/30/2021   PLT 230 08/30/2021    Lab Results  Component Value Date   TSH 0.333 (L) 10/04/2021    Radiographic Findings: No results found.  Impression/Plan:    1) Head and Neck Cancer Status: Healing well from radiation  2) Nutritional Status: Stabilizing  PEG tube: None  3) Risk Factors: The patient has been educated about risk factors including alcohol and tobacco abuse; they understand that avoidance of alcohol and tobacco is important to prevent recurrences as well as other cancers -I talked to him about strategies to quit but at this time he does not seem motivated  4) Swallowing: Functional, continue swallowing exercises  5)  Thyroid function: Check annually Lab Results  Component Value Date   TSH 0.333 (L) 10/04/2021    6) Other: He has some erythema and swelling in the left temple area just above his left parotid gland.  This appears infectious or inflammatory in nature.  It is not an area where he would I would expect his head neck cancer to metastasize; he reports similar lesions of his skin responding to antibiotics at the discretion of his PCP.  He will make an appointment with his PCP and let us know if this does not resolve satisfactorily  7) Follow-up in early/mid January with restaging PET. The patient was encouraged to call with any issues or questions before then.  On date of service, in total, I spent 25 minutes on this encounter. Patient was seen in person. _____________________________________   Eppie Gibson, MD

## 2021-11-30 ENCOUNTER — Ambulatory Visit: Payer: Medicare HMO | Attending: Radiation Oncology | Admitting: Physical Therapy

## 2021-11-30 ENCOUNTER — Encounter: Payer: Self-pay | Admitting: Family Medicine

## 2021-11-30 ENCOUNTER — Ambulatory Visit: Payer: Medicare HMO | Attending: Radiation Oncology

## 2021-11-30 ENCOUNTER — Other Ambulatory Visit: Payer: Self-pay | Admitting: Family

## 2021-11-30 ENCOUNTER — Ambulatory Visit (INDEPENDENT_AMBULATORY_CARE_PROVIDER_SITE_OTHER): Payer: Medicare HMO | Admitting: Family Medicine

## 2021-11-30 VITALS — BP 129/70 | HR 54 | Temp 98.4°F | Ht 69.0 in | Wt 202.0 lb

## 2021-11-30 DIAGNOSIS — C760 Malignant neoplasm of head, face and neck: Secondary | ICD-10-CM | POA: Insufficient documentation

## 2021-11-30 DIAGNOSIS — L03211 Cellulitis of face: Secondary | ICD-10-CM | POA: Diagnosis not present

## 2021-11-30 DIAGNOSIS — G500 Trigeminal neuralgia: Secondary | ICD-10-CM

## 2021-11-30 DIAGNOSIS — R293 Abnormal posture: Secondary | ICD-10-CM | POA: Insufficient documentation

## 2021-11-30 MED ORDER — SULFAMETHOXAZOLE-TRIMETHOPRIM 800-160 MG PO TABS: 1.0000 | ORAL_TABLET | Freq: Two times a day (BID) | ORAL | 0 refills | Status: DC

## 2021-11-30 NOTE — Telephone Encounter (Signed)
Last office visit 09/05/21 Upcoming appointment 12/05/21 Last refill 07/14/21, #120, 3 refills

## 2021-11-30 NOTE — Progress Notes (Signed)
Oncology Nurse Navigator Documentation   I met with Matthew [name] before his scheduled follow up with Dr. Isidore Moos yesterday after completing radiation for head and neck cancer. He is feeling well, eating a wide variety of foods but does continue to report taste changes. He will see Dr. Isidore Moos in January to receive results of a restaging PET scan which will be completed before that appointment. He knows to call me if he has any needs or concerns between now and then.     Harlow Asa RN, BSN, OCN Head & Neck Oncology Nurse [place] at [facility] Phone # [phone]  Fax # [phone]

## 2021-11-30 NOTE — Progress Notes (Signed)
BP 129/70   Pulse (!) 54   Temp 98.4 F (36.9 C)   Ht 5\' 9"  (1.753 m)   Wt 202 lb (91.6 kg)   SpO2 96%   BMI 29.83 kg/m    Subjective:   Patient ID: Matthew [name], male    DOB: 06-07-1960, 61 y.o.   MRN: [id number]  HPI: Matthew [name] is a 61 y.o. male presenting on 11/30/2021 for Mass (Left cheek, swollen, red, sore)   HPI Facial swelling and pain Patient is coming in for facial swelling and pain on the left side near his temple.  He says it started swelling a couple weeks ago and thinks he may have had a bug bite or something and then is continued to swell up to where the pressure is going towards his eye and back towards his ear as well.  Presents very red and very painful to touch.  He denies any fevers or chills.  Relevant past medical, surgical, family and social history reviewed and updated as indicated. Interim medical history since our last visit reviewed. Allergies and medications reviewed and updated.  Review of Systems  Constitutional:  Negative for chills and fever.  HENT:  Positive for facial swelling.   Respiratory:  Negative for shortness of breath and wheezing.   Cardiovascular:  Negative for chest pain and leg swelling.  Musculoskeletal:  Negative for back pain and gait problem.  Skin:  Positive for color change. Negative for rash and wound.  All other systems reviewed and are negative.   Per HPI unless specifically indicated above   Allergies as of 11/30/2021       Reactions   Penicillins Other (See Comments)   Did it involve swelling of the face/tongue/throat, SOB, or low BP? Unknown Did it involve sudden or severe rash/hives, skin peeling, or any reaction on the inside of your mouth or nose? Unknown Did you need to seek medical attention at a hospital or doctor's office? Unknown When did it last happen? childhood  If all above answers are "NO", may proceed with cephalosporin use.        Medication List        Accurate as of November 30, 2021  11:35 AM. If you have any questions, ask your nurse or doctor.          albuterol 108 (90 Base) MCG/ACT inhaler Commonly known as: VENTOLIN HFA TAKE 2 PUFFS BY MOUTH EVERY 6 HOURS AS NEEDED FOR WHEEZE OR SHORTNESS OF BREATH   amLODipine 10 MG tablet Commonly known as: NORVASC Take 1 tablet (10 mg total) by mouth daily. (NEEDS TO BE SEEN BEFORE NEXT REFILL)   atorvastatin 40 MG tablet Commonly known as: LIPITOR TAKE 1 TABLET BY MOUTH EVERY DAY   baclofen 10 MG tablet Commonly known as: LIORESAL TAKE 1 TABLET BY MOUTH THREE TIMES A DAY AS NEEDED FOR MUSCLE SPASMS   blood glucose meter kit and supplies Dispense based on patient and insurance preference. Use up to four times daily as directed. (FOR ICD-10 E10.9, E11.9).   carvedilol 3.125 MG tablet Commonly known as: COREG TAKE 1 TABLET BY MOUTH TWICE A DAY WITH MEALS   DULoxetine 60 MG capsule Commonly known as: CYMBALTA Take 1 capsule (60 mg total) by mouth daily. (NEEDS TO BE SEEN BEFORE NEXT REFILL)   Eliquis 5 MG Tabs tablet Generic drug: apixaban TAKE 1 TABLET BY MOUTH TWICE A DAY **EMERGENCY FILL - PT LEFT IN [place]**   fluconazole 100 MG tablet Commonly  known as: DIFLUCAN Take 2 tabs today, then 1 tab daily for 20 more days. Hold Atorvastatin while on this.   fluticasone 50 MCG/ACT nasal spray Commonly known as: FLONASE SPRAY 2 SPRAYS INTO EACH NOSTRIL EVERY DAY   gabapentin 800 MG tablet Commonly known as: NEURONTIN TAKE 1 TABLET IN THE MORNING, AT NOON, IN THE EVENING, AND AT BEDTIME. (NEEDS APPT FOR REFILLS)   glucose blood test strip Test BS daily Dx E11.65   HYDROcodone-acetaminophen 5-325 MG tablet Commonly known as: NORCO/VICODIN Take 1 tablet by mouth every 6 (six) hours as needed for moderate pain.   lidocaine 2 % solution Commonly known as: XYLOCAINE SMARTSIG:By Mouth   lisinopril 5 MG tablet Commonly known as: ZESTRIL TAKE 1 TABLET (5 MG TOTAL) BY MOUTH DAILY.   loratadine 10 MG  tablet Commonly known as: CLARITIN Take 1 tablet (10 mg total) by mouth daily.   magic mouthwash (nystatin, lidocaine, diphenhydrAMINE, alum & mag hydroxide) suspension Swish and spit 10 mLs 3 (three) times daily.   metFORMIN 500 MG tablet Commonly known as: Glucophage Take 1 tablet (500 mg total) by mouth daily with breakfast.   OXcarbazepine 150 MG tablet Commonly known as: TRILEPTAL TAKE 1 TABLET BY MOUTH TWICE A DAY   polyethylene glycol powder 17 GM/SCOOP powder Commonly known as: GLYCOLAX/MIRALAX Stir and dissolve 17g of powder in any 4 to 8 ounces of beverage (cold, hot or room temperature), then drink. Take 1-2 times daily to prevent constipation.   sulfamethoxazole-trimethoprim 800-160 MG tablet Commonly known as: BACTRIM DS Take 1 tablet by mouth 2 (two) times daily. Started by: Worthy Rancher, MD   Trelegy Cleaster Corin 100-62.5-25 MCG/ACT Aepb Generic drug: Fluticasone-Umeclidin-Vilant TAKE 1 PUFF BY MOUTH EVERY DAY   Vitamin D (Ergocalciferol) 1.25 MG (50000 UNIT) Caps capsule Commonly known as: DRISDOL Take 1 capsule (50,000 Units total) by mouth every 7 (seven) days.         Objective:   BP 129/70   Pulse (!) 54   Temp 98.4 F (36.9 C)   Ht $R'5\' 9"'Ir$  (1.753 m)   Wt 202 lb (91.6 kg)   SpO2 96%   BMI 29.83 kg/m   Wt Readings from Last 3 Encounters:  11/30/21 202 lb (91.6 kg)  11/29/21 205 lb (93 kg)  10/04/21 215 lb (97.5 kg)    Physical Exam Vitals and nursing note reviewed.  Constitutional:      Appearance: Normal appearance.  Skin:    Findings: Erythema and rash present.       Neurological:     Mental Status: He is alert.       Assessment & Plan:   Problem List Items Addressed This Visit   None Visit Diagnoses     Facial cellulitis    -  Primary   Relevant Medications   sulfamethoxazole-trimethoprim (BACTRIM DS) 800-160 MG tablet     Left facial cellulitis, able to express a small amount of purulent drainage.  We will treat with  antibiotic  Follow up plan: Return if symptoms worsen or fail to improve.  Counseling provided for all of the vaccine components No orders of the defined types were placed in this encounter.   Caryl Pina, MD [facility] 11/30/2021, 11:35 AM

## 2021-12-02 DIAGNOSIS — C760 Malignant neoplasm of head, face and neck: Secondary | ICD-10-CM | POA: Diagnosis not present

## 2021-12-02 DIAGNOSIS — L0201 Cutaneous abscess of face: Secondary | ICD-10-CM | POA: Diagnosis not present

## 2021-12-02 DIAGNOSIS — R220 Localized swelling, mass and lump, head: Secondary | ICD-10-CM | POA: Diagnosis not present

## 2021-12-02 DIAGNOSIS — R69 Illness, unspecified: Secondary | ICD-10-CM | POA: Diagnosis not present

## 2021-12-02 DIAGNOSIS — I10 Essential (primary) hypertension: Secondary | ICD-10-CM | POA: Diagnosis not present

## 2021-12-02 DIAGNOSIS — L03211 Cellulitis of face: Secondary | ICD-10-CM | POA: Diagnosis not present

## 2021-12-02 DIAGNOSIS — R221 Localized swelling, mass and lump, neck: Secondary | ICD-10-CM | POA: Diagnosis not present

## 2021-12-02 DIAGNOSIS — E871 Hypo-osmolality and hyponatremia: Secondary | ICD-10-CM | POA: Diagnosis not present

## 2021-12-02 DIAGNOSIS — E119 Type 2 diabetes mellitus without complications: Secondary | ICD-10-CM | POA: Diagnosis not present

## 2021-12-02 DIAGNOSIS — Z923 Personal history of irradiation: Secondary | ICD-10-CM | POA: Diagnosis not present

## 2021-12-02 DIAGNOSIS — R509 Fever, unspecified: Secondary | ICD-10-CM | POA: Diagnosis not present

## 2021-12-03 DIAGNOSIS — C140 Malignant neoplasm of pharynx, unspecified: Secondary | ICD-10-CM | POA: Diagnosis not present

## 2021-12-03 DIAGNOSIS — L03211 Cellulitis of face: Secondary | ICD-10-CM | POA: Diagnosis not present

## 2021-12-03 DIAGNOSIS — L0201 Cutaneous abscess of face: Secondary | ICD-10-CM | POA: Diagnosis not present

## 2021-12-03 DIAGNOSIS — J449 Chronic obstructive pulmonary disease, unspecified: Secondary | ICD-10-CM | POA: Diagnosis not present

## 2021-12-03 DIAGNOSIS — Z923 Personal history of irradiation: Secondary | ICD-10-CM | POA: Diagnosis not present

## 2021-12-03 DIAGNOSIS — Z8673 Personal history of transient ischemic attack (TIA), and cerebral infarction without residual deficits: Secondary | ICD-10-CM | POA: Diagnosis not present

## 2021-12-03 DIAGNOSIS — E119 Type 2 diabetes mellitus without complications: Secondary | ICD-10-CM | POA: Diagnosis not present

## 2021-12-04 DIAGNOSIS — Z923 Personal history of irradiation: Secondary | ICD-10-CM | POA: Diagnosis not present

## 2021-12-04 DIAGNOSIS — L0201 Cutaneous abscess of face: Secondary | ICD-10-CM | POA: Diagnosis not present

## 2021-12-04 DIAGNOSIS — L03211 Cellulitis of face: Secondary | ICD-10-CM | POA: Diagnosis not present

## 2021-12-04 DIAGNOSIS — Z8673 Personal history of transient ischemic attack (TIA), and cerebral infarction without residual deficits: Secondary | ICD-10-CM | POA: Diagnosis not present

## 2021-12-04 DIAGNOSIS — C140 Malignant neoplasm of pharynx, unspecified: Secondary | ICD-10-CM | POA: Diagnosis not present

## 2021-12-04 DIAGNOSIS — J449 Chronic obstructive pulmonary disease, unspecified: Secondary | ICD-10-CM | POA: Diagnosis not present

## 2021-12-04 DIAGNOSIS — E119 Type 2 diabetes mellitus without complications: Secondary | ICD-10-CM | POA: Diagnosis not present

## 2021-12-05 ENCOUNTER — Encounter: Payer: Self-pay | Admitting: Family

## 2021-12-05 ENCOUNTER — Ambulatory Visit (INDEPENDENT_AMBULATORY_CARE_PROVIDER_SITE_OTHER): Payer: Medicare HMO | Admitting: Family

## 2021-12-05 VITALS — BP 117/75 | HR 58 | Temp 98.1°F | Ht 69.0 in | Wt 202.0 lb

## 2021-12-05 DIAGNOSIS — E1142 Type 2 diabetes mellitus with diabetic polyneuropathy: Secondary | ICD-10-CM | POA: Diagnosis not present

## 2021-12-05 DIAGNOSIS — G500 Trigeminal neuralgia: Secondary | ICD-10-CM

## 2021-12-05 DIAGNOSIS — I10 Essential (primary) hypertension: Secondary | ICD-10-CM | POA: Diagnosis not present

## 2021-12-05 DIAGNOSIS — I2584 Coronary atherosclerosis due to calcified coronary lesion: Secondary | ICD-10-CM

## 2021-12-05 DIAGNOSIS — I2510 Atherosclerotic heart disease of native coronary artery without angina pectoris: Secondary | ICD-10-CM

## 2021-12-05 DIAGNOSIS — Z8673 Personal history of transient ischemic attack (TIA), and cerebral infarction without residual deficits: Secondary | ICD-10-CM | POA: Diagnosis not present

## 2021-12-05 DIAGNOSIS — Z09 Encounter for follow-up examination after completed treatment for conditions other than malignant neoplasm: Secondary | ICD-10-CM

## 2021-12-05 DIAGNOSIS — E663 Overweight: Secondary | ICD-10-CM

## 2021-12-05 DIAGNOSIS — F4323 Adjustment disorder with mixed anxiety and depressed mood: Secondary | ICD-10-CM | POA: Diagnosis not present

## 2021-12-05 DIAGNOSIS — M549 Dorsalgia, unspecified: Secondary | ICD-10-CM

## 2021-12-05 DIAGNOSIS — Z923 Personal history of irradiation: Secondary | ICD-10-CM | POA: Diagnosis not present

## 2021-12-05 DIAGNOSIS — L0201 Cutaneous abscess of face: Secondary | ICD-10-CM | POA: Diagnosis not present

## 2021-12-05 DIAGNOSIS — F331 Major depressive disorder, recurrent, moderate: Secondary | ICD-10-CM | POA: Diagnosis not present

## 2021-12-05 DIAGNOSIS — C760 Malignant neoplasm of head, face and neck: Secondary | ICD-10-CM

## 2021-12-05 DIAGNOSIS — G8929 Other chronic pain: Secondary | ICD-10-CM

## 2021-12-05 DIAGNOSIS — E119 Type 2 diabetes mellitus without complications: Secondary | ICD-10-CM | POA: Diagnosis not present

## 2021-12-05 DIAGNOSIS — I6930 Unspecified sequelae of cerebral infarction: Secondary | ICD-10-CM

## 2021-12-05 DIAGNOSIS — Z720 Tobacco use: Secondary | ICD-10-CM

## 2021-12-05 DIAGNOSIS — C140 Malignant neoplasm of pharynx, unspecified: Secondary | ICD-10-CM | POA: Diagnosis not present

## 2021-12-05 DIAGNOSIS — J449 Chronic obstructive pulmonary disease, unspecified: Secondary | ICD-10-CM | POA: Diagnosis not present

## 2021-12-05 DIAGNOSIS — I4891 Unspecified atrial fibrillation: Secondary | ICD-10-CM

## 2021-12-05 DIAGNOSIS — Z550 Illiteracy and low-level literacy: Secondary | ICD-10-CM

## 2021-12-05 DIAGNOSIS — L03211 Cellulitis of face: Secondary | ICD-10-CM | POA: Diagnosis not present

## 2021-12-05 MED ORDER — GABAPENTIN 800 MG PO TABS: ORAL_TABLET | ORAL | 3 refills | Status: DC

## 2021-12-05 MED ORDER — DULOXETINE HCL 60 MG PO CPEP: 60.0000 mg | ORAL_CAPSULE | Freq: Every day | ORAL | 3 refills | Status: DC

## 2021-12-05 MED ORDER — NYSTATIN 100000 UNIT/ML MT SUSP: 10.0000 mL | Freq: Three times a day (TID) | OROMUCOSAL | 1 refills | Status: AC

## 2021-12-05 NOTE — Progress Notes (Signed)
 Subjective:    Patient ID: Matthew [name], male    DOB: 08/26/1960, 61 y.o.   MRN: [id number]  Chief Complaint  Patient presents with   Medical Management of Chronic Issues   Hospitalization Follow-up    Just got out today    Pt presents to the office for chronic follow up. He has hx of facial trauma and mandible fracture 04/05/21. He is currently being treated for metastatic squamous neck cancer.   He was just released from hospital today for cellulitis of face. They drained the abscess on left face. He was given IV antibiotics. I am unsure exactly what was completed during hospital stay as I do not have access to their system and he did not bring his discharge notes. He was discharged on Bactrim.    He is a diabetic, HTN, COPD, and hx of CVA. He takes Eliquis daily.    Has trigeminal neuralgia and was followed by Neurologists.    Has thoracic aortic arteriosclerosis and CAD and takes Lipitor daily.    Has COPD and he continues to smoke a pack a day. Using Trelegy. Hypertension This is a chronic problem. The current episode started more than 1 year ago. The problem has been resolved since onset. The problem is controlled. Associated symptoms include headaches and malaise/fatigue. Pertinent negatives include no blurred vision, peripheral edema or shortness of breath. Risk factors for coronary artery disease include dyslipidemia, diabetes mellitus, obesity, male gender, sedentary lifestyle and smoking/tobacco exposure. The current treatment provides moderate improvement.  Diabetes He presents for his follow-up diabetic visit. He has type 2 diabetes mellitus. Hypoglycemia symptoms include headaches. Associated symptoms include fatigue. Pertinent negatives for diabetes include no blurred vision and no foot paresthesias. Symptoms are stable. Diabetic complications include heart disease. Risk factors for coronary artery disease include dyslipidemia, diabetes mellitus, hypertension, male sex and  sedentary lifestyle. He is following a generally unhealthy diet. His overall blood glucose range is 180-200 mg/dl.  Nicotine Dependence Presents for follow-up visit. Symptoms include fatigue. His urge triggers include company of smokers. The symptoms have been stable. He smokes 1 pack of cigarettes per day.  Depression        This is a chronic problem.  The current episode started more than 1 year ago.   The onset quality is gradual.   The problem occurs intermittently.  Associated symptoms include fatigue and headaches.  Associated symptoms include no helplessness, no hopelessness and not sad.  Past treatments include SNRIs - Serotonin and norepinephrine reuptake inhibitors.     Review of Systems  Constitutional:  Positive for fatigue and malaise/fatigue.  Eyes:  Negative for blurred vision.  Respiratory:  Negative for shortness of breath.   Neurological:  Positive for headaches.  Psychiatric/Behavioral:  Positive for depression.   All other systems reviewed and are negative.      Objective:   Physical Exam Vitals reviewed.  Constitutional:      General: He is not in acute distress.    Appearance: He is well-developed. He is obese.  HENT:     Head: Normocephalic.     Right Ear: Tympanic membrane normal.     Left Ear: Tympanic membrane normal.  Eyes:     General:        Right eye: No discharge.        Left eye: No discharge.     Pupils: Pupils are equal, round, and reactive to light.  Neck:     Thyroid: No thyromegaly.  Cardiovascular:       Rate and Rhythm: Normal rate and regular rhythm.     Heart sounds: Normal heart sounds. No murmur heard. Pulmonary:     Effort: Pulmonary effort is normal. No respiratory distress.     Breath sounds: Normal breath sounds. No wheezing.  Abdominal:     General: Bowel sounds are normal. There is no distension.     Palpations: Abdomen is soft.     Tenderness: There is no abdominal tenderness.  Musculoskeletal:        General: No  tenderness. Normal range of motion.     Cervical back: Normal range of motion and neck supple.  Skin:    General: Skin is warm and dry.     Findings: No erythema or rash.          Comments: Mild swelling of left face  Neurological:     Mental Status: He is alert and oriented to person, place, and time.     Cranial Nerves: No cranial nerve deficit.     Deep Tendon Reflexes: Reflexes are normal and symmetric.  Psychiatric:        Behavior: Behavior normal.        Thought Content: Thought content normal.        Judgment: Judgment normal.       BP 117/75   Pulse (!) 58   Temp 98.1 F (36.7 C) (Temporal)   Ht 5' 9" (1.753 m)   Wt 202 lb (91.6 kg)   BMI 29.83 kg/m      Assessment & Plan:   [name] comes in today with chief complaint of Medical Management of Chronic Issues and Hospitalization Follow-up (Just got out today )   Diagnosis and orders addressed:  1. Trigeminal neuralgia - gabapentin (NEURONTIN) 800 MG tablet; TAKE 1 TABLET IN THE MORNING, AT NOON, IN THE EVENING, AND AT BEDTIME. (NEEDS APPT FOR REFILLS)  Dispense: 120 tablet; Refill: 3 - DULoxetine (CYMBALTA) 60 MG capsule; Take 1 capsule (60 mg total) by mouth daily. (NEEDS TO BE SEEN BEFORE NEXT REFILL)  Dispense: 90 capsule; Refill: 3 - CMP14+EGFR - CBC with Differential/Platelet  2. Primary hypertension - CMP14+EGFR - CBC with Differential/Platelet  3. Coronary artery calcification - CMP14+EGFR - CBC with Differential/Platelet  4. Atrial fibrillation with RVR (HCC)  - CMP14+EGFR - CBC with Differential/Platelet  5. Type 2 diabetes mellitus with diabetic polyneuropathy, without long-term current use of insulin (HCC) - CMP14+EGFR - CBC with Differential/Platelet - Bayer DCA Hb A1c Waived - Microalbumin / creatinine urine ratio  6. Adjustment disorder with mixed anxiety and depressed mood - CMP14+EGFR - CBC with Differential/Platelet  7. Overweight (BMI 25.0-29.9) - CMP14+EGFR - CBC with  Differential/Platelet  8. Chronic midline back pain, unspecified back location - CMP14+EGFR - CBC with Differential/Platelet  9. Moderate episode of recurrent major depressive disorder (HCC) - DULoxetine (CYMBALTA) 60 MG capsule; Take 1 capsule (60 mg total) by mouth daily. (NEEDS TO BE SEEN BEFORE NEXT REFILL)  Dispense: 90 capsule; Refill: 3 - CMP14+EGFR - CBC with Differential/Platelet  10. H/O: CVA (cerebrovascular accident) - CMP14+EGFR - CBC with Differential/Platelet  11. Late effect of cerebrovascular accident (CVA) - CMP14+EGFR - CBC with Differential/Platelet  12. Tobacco use - CMP14+EGFR - CBC with Differential/Platelet  13. Head and neck cancer (HCC) - magic mouthwash (nystatin, lidocaine, diphenhydrAMINE, alum & mag hydroxide) suspension; Swish and spit 10 mLs 3 (three) times daily.  Dispense: 180 mL; Refill: 1 - CMP14+EGFR - CBC with Differential/Platelet  14. Unable to read or   write - CMP14+EGFR - CBC with Differential/Platelet  15. Hospital discharge follow-up Start Bactrim    Labs pending Health Maintenance reviewed Diet and exercise encouraged  Follow up plan: 3 months and keep follow up with Oncologists    Evelina Dun, FNP

## 2021-12-05 NOTE — Patient Instructions (Signed)
Skin Abscess  A skin abscess is an infected area on or under your skin that contains a collection of pus and other material. An abscess may also be called a furuncle, carbuncle, or boil. An abscess can occur in or on almost any part of your body. Some abscesses break open (rupture) on their own. Most continue to get worse unless they are treated. The infection can spread deeper into the body and eventually into your blood, which can make you feel ill. Treatment usually involves draining the abscess. What are the causes? An abscess occurs when germs, like bacteria, pass through your skin and cause an infection. This may be caused by: A scrape or cut on your skin. A puncture wound through your skin, including a needle injection or insect bite. Blocked oil or sweat glands. Blocked and infected hair follicles. A cyst that forms beneath your skin (sebaceous cyst) and becomes infected. What increases the risk? This condition is more likely to develop in people who: Have a weak body defense system (immune system). Have diabetes. Have dry and irritated skin. Get frequent injections or use illegal IV drugs. Have a foreign body in a wound, such as a splinter. Have problems with their lymph system or veins. What are the signs or symptoms? Symptoms of this condition include: A painful, firm bump under the skin. A bump with pus at the top. This may break through the skin and drain. Other symptoms include: Redness surrounding the abscess site. Warmth. Swelling of the lymph nodes (glands) near the abscess. Tenderness. A sore on the skin. How is this diagnosed? This condition may be diagnosed based on: A physical exam. Your medical history. A sample of pus. This may be used to find out what is causing the infection. Blood tests. Imaging tests, such as an ultrasound, CT scan, or MRI. How is this treated? A small abscess that drains on its own may not need treatment. Treatment for larger abscesses  may include: Moist heat or heat pack applied to the area several times a day. A procedure to drain the abscess (incision and drainage). Antibiotic medicines. For a severe abscess, you may first get antibiotics through an IV and then change to antibiotics by mouth. Follow these instructions at home: Medicines  Take over-the-counter and prescription medicines only as told by your health care provider. If you were prescribed an antibiotic medicine, take it as told by your health care provider. Do not stop taking the antibiotic even if you start to feel better. Abscess care  If you have an abscess that has not drained, apply heat to the affected area. Use the heat source that your health care provider recommends, such as a moist heat pack or a heating pad. Place a towel between your skin and the heat source. Leave the heat on for 20-30 minutes. Remove the heat if your skin turns bright red. This is especially important if you are unable to feel pain, heat, or cold. You may have a greater risk of getting burned. Follow instructions from your health care provider about how to take care of your abscess. Make sure you: Cover the abscess with a bandage (dressing). Change your dressing or gauze as told by your health care provider. Wash your hands with soap and water before you change the dressing or gauze. If soap and water are not available, use hand sanitizer. Check your abscess every day for signs of a worsening infection. Check for: More redness, swelling, or pain. More fluid or blood. Warmth.   More pus or a bad smell. General instructions To avoid spreading the infection: Do not share personal care items, towels, or hot tubs with others. Avoid making skin contact with other people. Keep all follow-up visits as told by your health care provider. This is important. Contact a health care provider if you have: More redness, swelling, or pain around your abscess. More fluid or blood coming from  your abscess. Warm skin around your abscess. More pus or a bad smell coming from your abscess. Muscle aches. Chills or a general ill feeling. Get help right away if you: Have severe pain. See red streaks on your skin spreading away from the abscess. See redness that spreads quickly. Have a fever or chills. Summary A skin abscess is an infected area on or under your skin that contains a collection of pus and other material. A small abscess that drains on its own may not need treatment. Treatment for larger abscesses may include having a procedure to drain the abscess and taking an antibiotic. This information is not intended to replace advice given to you by your health care provider. Make sure you discuss any questions you have with your health care provider. Document Revised: 05/05/2021 Document Reviewed: 11/08/2020 Elsevier Patient Education  2023 [employer]  

## 2021-12-09 ENCOUNTER — Telehealth: Payer: Self-pay | Admitting: Family

## 2021-12-09 ENCOUNTER — Other Ambulatory Visit: Payer: Self-pay | Admitting: Family

## 2021-12-09 DIAGNOSIS — I4891 Unspecified atrial fibrillation: Secondary | ICD-10-CM

## 2021-12-09 MED ORDER — APIXABAN 5 MG PO TABS: ORAL_TABLET | ORAL | 2 refills | Status: DC

## 2021-12-09 NOTE — Telephone Encounter (Signed)
Prescription sent to pharmacy.

## 2021-12-09 NOTE — Telephone Encounter (Signed)
  Prescription Request  12/09/2021  Is this a "Controlled Substance" medicine? no  Have you seen your PCP in the last 2 weeks? bo  If YES, route message to pool  -  If NO, patient needs to be scheduled for appointment.  What is the name of the medication or equipment? apixaban (ELIQUIS) 5 MG TABS tablet [[id number]]  Have you contacted your pharmacy to request a refill? yes - Patient is at pharmacy now.  Which pharmacy would you like this sent to? [facility]   Patient notified that their request is being sent to the clinical staff for review and that they should receive a response within 2 business days.

## 2021-12-22 ENCOUNTER — Other Ambulatory Visit: Payer: Self-pay | Admitting: Family

## 2021-12-22 DIAGNOSIS — E785 Hyperlipidemia, unspecified: Secondary | ICD-10-CM

## 2021-12-26 ENCOUNTER — Telehealth: Payer: Self-pay | Admitting: Family

## 2021-12-26 ENCOUNTER — Encounter: Payer: Self-pay | Admitting: Family

## 2021-12-26 ENCOUNTER — Ambulatory Visit (INDEPENDENT_AMBULATORY_CARE_PROVIDER_SITE_OTHER): Payer: Medicare HMO | Admitting: Family

## 2021-12-26 ENCOUNTER — Other Ambulatory Visit: Payer: Self-pay | Admitting: Family

## 2021-12-26 VITALS — BP 137/84 | HR 72 | Temp 98.4°F | Ht 69.0 in | Wt 203.8 lb

## 2021-12-26 DIAGNOSIS — C7989 Secondary malignant neoplasm of other specified sites: Secondary | ICD-10-CM | POA: Diagnosis not present

## 2021-12-26 DIAGNOSIS — J438 Other emphysema: Secondary | ICD-10-CM | POA: Diagnosis not present

## 2021-12-26 DIAGNOSIS — Z23 Encounter for immunization: Secondary | ICD-10-CM

## 2021-12-26 DIAGNOSIS — G500 Trigeminal neuralgia: Secondary | ICD-10-CM | POA: Diagnosis not present

## 2021-12-26 DIAGNOSIS — I2510 Atherosclerotic heart disease of native coronary artery without angina pectoris: Secondary | ICD-10-CM | POA: Diagnosis not present

## 2021-12-26 DIAGNOSIS — E1142 Type 2 diabetes mellitus with diabetic polyneuropathy: Secondary | ICD-10-CM

## 2021-12-26 DIAGNOSIS — Z79899 Other long term (current) drug therapy: Secondary | ICD-10-CM

## 2021-12-26 DIAGNOSIS — C801 Malignant (primary) neoplasm, unspecified: Secondary | ICD-10-CM | POA: Diagnosis not present

## 2021-12-26 DIAGNOSIS — M549 Dorsalgia, unspecified: Secondary | ICD-10-CM | POA: Diagnosis not present

## 2021-12-26 DIAGNOSIS — F4323 Adjustment disorder with mixed anxiety and depressed mood: Secondary | ICD-10-CM | POA: Diagnosis not present

## 2021-12-26 DIAGNOSIS — I10 Essential (primary) hypertension: Secondary | ICD-10-CM

## 2021-12-26 DIAGNOSIS — E669 Obesity, unspecified: Secondary | ICD-10-CM | POA: Diagnosis not present

## 2021-12-26 DIAGNOSIS — I4891 Unspecified atrial fibrillation: Secondary | ICD-10-CM | POA: Diagnosis not present

## 2021-12-26 DIAGNOSIS — C760 Malignant neoplasm of head, face and neck: Secondary | ICD-10-CM | POA: Diagnosis not present

## 2021-12-26 DIAGNOSIS — E663 Overweight: Secondary | ICD-10-CM | POA: Diagnosis not present

## 2021-12-26 DIAGNOSIS — I2584 Coronary atherosclerosis due to calcified coronary lesion: Secondary | ICD-10-CM | POA: Diagnosis not present

## 2021-12-26 LAB — BAYER DCA HB A1C WAIVED: HB A1C (BAYER DCA - WAIVED): 5.7 % — ABNORMAL HIGH (ref 4.8–5.6)

## 2021-12-26 MED ORDER — HYDROCODONE-ACETAMINOPHEN 5-325 MG PO TABS: 1.0000 | ORAL_TABLET | Freq: Three times a day (TID) | ORAL | 0 refills | Status: DC | PRN

## 2021-12-26 NOTE — Addendum Note (Signed)
Addended by: Evelina Dun A on: 12/26/2021 03:48 PM   Modules accepted: Orders

## 2021-12-26 NOTE — Telephone Encounter (Signed)
Prescription sent to pharmacy.

## 2021-12-26 NOTE — Patient Instructions (Signed)
Radiation Therapy Information, Adult Radiation therapy, also called radiotherapy, is a type of cancer treatment that uses high-energy waves of radiation to kill cancer cells or shrink tumors. The energy in radiation makes cancer cells stop multiplying. Radiation therapy affects only the cells in the area of the body that gets treatment. Cancer cells do not start to die until after days or weeks of treatment, and they continue dying for weeks or months after radiation therapy ends. In radiation therapy, the radiation may be delivered from outside your body (external beam radiation therapy) or from a source of radiation that is put inside your body (internal or systemic radiation therapy). How often and how long you will need radiation therapy depends on your condition. What are the risks? Generally, this is a safe treatment. Some people do not have side effects from the therapy. However, most people will have side effects. Side effects depend on the amount of radiation and the part of the body that was exposed to radiation. The most common side effects include: Tiredness (fatigue). Red, irritated skin where the radiation was given. Nausea or vomiting. Diarrhea. Hair loss. Mouth pain or sores. Other side effects may include: Swelling in the throat and trouble swallowing. Urinary and bladder changes. Sexual changes. Some effects may appear many years later, such as: Inability to have a baby (infertility). Developing another type of cancer. Questions to ask your health care provider: What kind of radiation therapy do I need? What are the goals of the treatment? How long will treatment take? How can I prevent or manage side effects? Will my fertility be affected? Are there long-term side effects of this treatment? What happens before treatment? If you are going to have external beam radiation therapy, you will go through a planning session (simulation) before treatment begins. During the  simulation session: Your health care provider will plan exactly where the radiation will be delivered (treatment field). You will be positioned for your therapy. The goal is to have a position that can be reproduced for each therapy session. Temporary marks may be drawn on your body. Permanent marks may also be drawn on your body in order for you to be positioned the same way for each therapy session. A tool that holds a body part in place (immobilization device) may be used to keep the area of treatment in the correct position. You can also take these steps to prepare for treatment: Have your health care provider explain anything that you do not understand. Discuss any concerns or fears you may have. What happens during treatment? The treatment will vary depending on the type of radiation therapy you are having. Types include: External beam radiation therapy. A beam of radiation comes from a machine and enters your body, targeting the specific area of cancer cells. Internal radiation therapy, or brachytherapy. A wire, seed, or other material that contains radiation is placed inside your body in the area of the cancer cells. Systemic radiation therapy. You are given a radioactive medicine that travels through your blood to kill the cancer cells. You may take the medicine by mouth or may receive it through an IV inserted into one of your veins. Follow these instructions at home: Radiation therapy affects everyone differently. If you do have side effects, you can take steps to manage them. Skin care  Clean your skin daily with warm water and a mild soap that is recommended by your health care provider. Do not scrub or rub your skin. Use only warm water in  the shower or bath. Avoid very hot water. Gently pat your skin dry after bathing or showering. If your skin becomes dry, ask your health care provider about the use of gentle lotions or creams. Avoid scratching the treated area. Do not wash off  the markings from the simulation until instructed to do so. Avoid the sun. If you are in the sun, try to cover treated areas and apply a sunscreen that has been approved by your health care provider. Eating and drinking     Take these steps to help with nausea, vomiting, diarrhea, or soreness in the mouth or throat: Eat small nutritious meals and snacks regularly during the day. Choose bland and soft foods that are easy to eat. Avoid spicy or sugary snacks. If you have sores in your mouth, also avoid very hot or very cold food and drinks. Drink plenty of clear fluids, especially if you have diarrhea. Take over-the-counter or prescription medicines for your symptoms as told by your health care provider.  Urinary and bladder changes These steps can help if you have urinary and bladder symptoms such as urinary frequency, burning with urination, inability to control when you urinate (incontinence), blood in your urine, bladder spasms, or trouble emptying your bladder: Drink plenty of fluids. Avoid caffeinated beverages like tea or coffee. Follow other instructions from your health care provider, which may include: Doing physical therapy if you have incontinence. Taking antibiotics for urinary tract infections or other medicines for symptoms like bladder spasms. Sexual or fertility issues Radiation therapy can sometimes cause sexual changes, like hormone changes and decreased sex drive. It can also cause short-term or long-term infertility in males or females. Symptoms in the male reproductive tract may also include pain with sex, vaginal dryness or itching, and symptoms of menopause, such as hot flashes. Symptoms in the male reproductive tract may also include the inability to have an erection (impotence). Taking the following steps can help manage these issues: Before you start radiation therapy, let your health care team know if you think you might want to have children in the future. They  can discuss preserving eggs or sperm. Ask your health care provider if it is okay to have sex while on radiation therapy. During radiation therapy, be sure to practice safe sex and use birth control or condoms. Use lubricants during sexual activity and ask your health care provider about other options, such as vaginal stretching if narrowing of the vagina develops. You may be prescribed medicine or other treatments if you experience sexual dysfunction. Throat care Radiation therapy to the neck or chest can cause the lining of your throat to swell and become sore (esophagitis). You may feel like you have a lump in your throat or burning in your chest or throat. You may also have trouble swallowing. You can take these steps to help with these symptoms: Be aware of the textures of food you eat when your throat is sore. Choose foods that are easy to swallow. You can cut, blend, or shred foods or eat soft foods. Eat small meals and snacks that are nutritious. Talking to a dietitian may help you select healthy options to maintain your weight. Sit upright when eating. General instructions Take over-the-counter and prescription medicines only as told by your health care provider. Do not use any products that contain nicotine or tobacco. These products include cigarettes, chewing tobacco, and vaping devices, such as e-cigarettes. If you need help quitting, ask your health care provider. Wear soft, comfortable clothing to  reduce skin discomfort. If you feel tired, take a short nap (30 minutes or less) during the day. Return to your normal activities as told by your health care provider. Ask your health care provider what activities are safe for you. Keep all follow-up visits. This is important. The visits are usually scheduled 6 weeks to 6 months after radiation therapy. Summary Radiation therapy is a type of cancer treatment that uses high-energy waves of radiation to kill cancer cells or shrink tumors. In  radiation therapy, the radiation may be delivered from outside your body or from a source of radiation that is put inside your body. Most people will experience side effects from the therapy. Side effects depend on the amount of radiation and the part of the body that was exposed to radiation. Keep all follow-up visits. This is important. This information is not intended to replace advice given to you by your health care provider. Make sure you discuss any questions you have with your health care provider. Document Revised: 11/30/2020 Document Reviewed: 11/30/2020 Elsevier Patient Education  [place].

## 2021-12-26 NOTE — Telephone Encounter (Signed)
Please have him call other pharmacies to see if they have Nocro 5 in stock and I will resend.

## 2021-12-26 NOTE — Telephone Encounter (Signed)
Left detailed message.   

## 2021-12-26 NOTE — Telephone Encounter (Signed)
Called and left detailed message if patient calls back  please ask to check other pharmacy.

## 2021-12-26 NOTE — Progress Notes (Signed)
Subjective:    Patient ID: Matthew [name], male    DOB: 09/14/1960, 61 y.o.   MRN: [id number]  Chief Complaint  Patient presents with   Jaw Pain   Pt presents to the office today with right jaw pain. He has hx of facial trauma and mandible fracture on 04/05/21. He is also being treated for metastatic squamous neck cancer. He completed radiation 2 weeks ago. He is followed by Oncologists.  He has COPD and continues to smoke a 1 pack a day.   Hypertension This is a chronic problem. The current episode started more than 1 year ago. The problem has been resolved since onset. The problem is controlled. Associated symptoms include malaise/fatigue and shortness of breath. Pertinent negatives include no blurred vision or peripheral edema. Risk factors for coronary artery disease include dyslipidemia, diabetes mellitus, obesity, male gender and sedentary lifestyle. The current treatment provides moderate improvement.  Diabetes He presents for his follow-up diabetic visit. He has type 2 diabetes mellitus. Pertinent negatives for diabetes include no blurred vision and no foot paresthesias. Risk factors for coronary artery disease include dyslipidemia, diabetes mellitus, male sex, hypertension and sedentary lifestyle. He is following a generally healthy diet. His overall blood glucose range is 90-110 mg/dl.      Review of Systems  Constitutional:  Positive for malaise/fatigue.  Eyes:  Negative for blurred vision.  Respiratory:  Positive for shortness of breath.   All other systems reviewed and are negative.      Objective:   Physical Exam Vitals reviewed.  Constitutional:      General: He is not in acute distress.    Appearance: He is well-developed. He is obese.  HENT:     Head: Normocephalic.     Comments: Pain in right jaw with opening jaw     Right Ear: Tympanic membrane normal.     Left Ear: Tympanic membrane normal.  Eyes:     General:        Right eye: No discharge.        Left  eye: No discharge.     Pupils: Pupils are equal, round, and reactive to light.  Neck:     Thyroid: No thyromegaly.  Cardiovascular:     Rate and Rhythm: Normal rate and regular rhythm.     Heart sounds: Normal heart sounds. No murmur heard. Pulmonary:     Effort: Pulmonary effort is normal. No respiratory distress.     Breath sounds: Normal breath sounds. No wheezing.  Abdominal:     General: Bowel sounds are normal. There is no distension.     Palpations: Abdomen is soft.     Tenderness: There is no abdominal tenderness.  Musculoskeletal:        General: No tenderness. Normal range of motion.     Cervical back: Normal range of motion and neck supple.  Skin:    General: Skin is warm and dry.     Findings: No erythema or rash.  Neurological:     Mental Status: He is alert and oriented to person, place, and time.     Cranial Nerves: No cranial nerve deficit.     Deep Tendon Reflexes: Reflexes are normal and symmetric.  Psychiatric:        Behavior: Behavior normal.        Thought Content: Thought content normal.        Judgment: Judgment normal.       BP 137/84   Pulse 72   Temp  98.4 F (36.9 C) (Temporal)   Ht '5\' 9"'$  (1.753 m)   Wt 203 lb 12.8 oz (92.4 kg)   SpO2 96%   BMI 30.10 kg/m      Assessment & Plan:  Matthew [name] comes in today with chief complaint of Jaw Pain   Diagnosis and orders addressed:  1. Need for immunization against influenza - Flu Vaccine QUAD 65moIM (Fluarix, Fluzone & Alfiuria Quad PF)  2. Primary hypertension  3. Type 2 diabetes mellitus with diabetic polyneuropathy, without long-term current use of insulin ([place]  4. Head and neck cancer ([place] - ToxASSURE Select 13 (MW), Urine - HYDROcodone-acetaminophen (NORCO/VICODIN) 5-325 MG tablet; Take 1 tablet by mouth every 8 (eight) hours as needed for moderate pain.  Dispense: 90 tablet; Refill: 0 - HYDROcodone-acetaminophen (NORCO) 5-325 MG tablet; Take 1 tablet by mouth every 8 (eight) hours  as needed for moderate pain.  Dispense: 90 tablet; Refill: 0 - HYDROcodone-acetaminophen (NORCO) 5-325 MG tablet; Take 1 tablet by mouth every 8 (eight) hours as needed for moderate pain.  Dispense: 90 tablet; Refill: 0  5. Obesity (BMI 30-39.9)  6. Other emphysema ([place]  7. Metastatic squamous neck cancer with occult primary ([place] - ToxASSURE Select 13 (MW), Urine - HYDROcodone-acetaminophen (NORCO/VICODIN) 5-325 MG tablet; Take 1 tablet by mouth every 8 (eight) hours as needed for moderate pain.  Dispense: 90 tablet; Refill: 0 - HYDROcodone-acetaminophen (NORCO) 5-325 MG tablet; Take 1 tablet by mouth every 8 (eight) hours as needed for moderate pain.  Dispense: 90 tablet; Refill: 0 - HYDROcodone-acetaminophen (NORCO) 5-325 MG tablet; Take 1 tablet by mouth every 8 (eight) hours as needed for moderate pain.  Dispense: 90 tablet; Refill: 0  8. Controlled substance agreement signed - ToxASSURE Select 13 (MW), Urine - HYDROcodone-acetaminophen (NORCO/VICODIN) 5-325 MG tablet; Take 1 tablet by mouth every 8 (eight) hours as needed for moderate pain.  Dispense: 90 tablet; Refill: 0 - HYDROcodone-acetaminophen (NORCO) 5-325 MG tablet; Take 1 tablet by mouth every 8 (eight) hours as needed for moderate pain.  Dispense: 90 tablet; Refill: 0 - HYDROcodone-acetaminophen (NORCO) 5-325 MG tablet; Take 1 tablet by mouth every 8 (eight) hours as needed for moderate pain.  Dispense: 90 tablet; Refill: 0   Labs pending ( will get labs from 12/05/21 drawn today) Patient reviewed in [place] controlled database, no flags noted. Contract and drug screen are up to date.  Health Maintenance reviewed Diet and exercise encouraged  Follow up plan: 3 months   CEvelina Dun FNP

## 2021-12-26 NOTE — Addendum Note (Signed)
Addended by: Brynda Peon F on: 12/26/2021 03:05 PM   Modules accepted: Orders

## 2021-12-26 NOTE — Telephone Encounter (Signed)
[facility] [place] has the medications

## 2021-12-27 LAB — CBC WITH DIFFERENTIAL/PLATELET
Basophils Absolute: 0 10*3/uL (ref 0.0–0.2)
Basos: 1 %
EOS (ABSOLUTE): 0.2 10*3/uL (ref 0.0–0.4)
Eos: 2 %
Hematocrit: 42.8 % (ref 37.5–51.0)
Hemoglobin: 14.4 g/dL (ref 13.0–17.7)
Immature Grans (Abs): 0 10*3/uL (ref 0.0–0.1)
Immature Granulocytes: 0 %
Lymphocytes Absolute: 0.9 10*3/uL (ref 0.7–3.1)
Lymphs: 12 %
MCH: 30.2 pg (ref 26.6–33.0)
MCHC: 33.6 g/dL (ref 31.5–35.7)
MCV: 90 fL (ref 79–97)
Monocytes Absolute: 0.6 10*3/uL (ref 0.1–0.9)
Monocytes: 9 %
Neutrophils Absolute: 5.7 10*3/uL (ref 1.4–7.0)
Neutrophils: 76 %
Platelets: 223 10*3/uL (ref 150–450)
RBC: 4.77 x10E6/uL (ref 4.14–5.80)
RDW: 13 % (ref 11.6–15.4)
WBC: 7.4 10*3/uL (ref 3.4–10.8)

## 2021-12-27 LAB — CMP14+EGFR
ALT: 18 IU/L (ref 0–44)
AST: 17 IU/L (ref 0–40)
Albumin/Globulin Ratio: 1.6 (ref 1.2–2.2)
Albumin: 4.6 g/dL (ref 3.9–4.9)
Alkaline Phosphatase: 104 IU/L (ref 44–121)
BUN/Creatinine Ratio: 26 — ABNORMAL HIGH (ref 10–24)
BUN: 14 mg/dL (ref 8–27)
Bilirubin Total: <0.2 mg/dL (ref 0.0–1.2)
CO2: 23 mmol/L (ref 20–29)
Calcium: 9.3 mg/dL (ref 8.6–10.2)
Chloride: 101 mmol/L (ref 96–106)
Creatinine, Ser: 0.53 mg/dL — ABNORMAL LOW (ref 0.76–1.27)
Globulin, Total: 2.8 g/dL (ref 1.5–4.5)
Glucose: 98 mg/dL (ref 70–99)
Potassium: 4.6 mmol/L (ref 3.5–5.2)
Sodium: 140 mmol/L (ref 134–144)
Total Protein: 7.4 g/dL (ref 6.0–8.5)
eGFR: 113 mL/min/{1.73_m2} (ref >59)

## 2021-12-27 LAB — MICROALBUMIN / CREATININE URINE RATIO
Creatinine, Urine: 12.9 mg/dL
Microalb/Creat Ratio: 34 mg/g creat — ABNORMAL HIGH (ref 0–29)
Microalbumin, Urine: 4.4 ug/mL

## 2021-12-29 LAB — TOXASSURE SELECT 13 (MW), URINE

## 2022-01-06 ENCOUNTER — Other Ambulatory Visit: Payer: Self-pay | Admitting: Family

## 2022-01-06 DIAGNOSIS — I10 Essential (primary) hypertension: Secondary | ICD-10-CM

## 2022-01-10 ENCOUNTER — Telehealth: Payer: Self-pay | Admitting: Family

## 2022-01-10 MED ORDER — TRELEGY ELLIPTA 100-62.5-25 MCG/ACT IN AEPB: INHALATION_SPRAY | RESPIRATORY_TRACT | 11 refills | Status: DC

## 2022-01-10 NOTE — Telephone Encounter (Signed)
Pt says [facility] is being [facility] that he doesn't have anymore refills on his Trelegy Inhaler. Pt says he has been without the inhaler for 3 days and needs refills sent ASAP.

## 2022-01-10 NOTE — Telephone Encounter (Signed)
Refill sent patient aware  

## 2022-01-17 DIAGNOSIS — R6884 Jaw pain: Secondary | ICD-10-CM | POA: Diagnosis not present

## 2022-01-17 DIAGNOSIS — J449 Chronic obstructive pulmonary disease, unspecified: Secondary | ICD-10-CM | POA: Diagnosis not present

## 2022-01-17 DIAGNOSIS — Z8781 Personal history of (healed) traumatic fracture: Secondary | ICD-10-CM | POA: Diagnosis not present

## 2022-01-17 DIAGNOSIS — Z720 Tobacco use: Secondary | ICD-10-CM | POA: Diagnosis not present

## 2022-01-17 DIAGNOSIS — I2510 Atherosclerotic heart disease of native coronary artery without angina pectoris: Secondary | ICD-10-CM | POA: Diagnosis not present

## 2022-01-17 DIAGNOSIS — I889 Nonspecific lymphadenitis, unspecified: Secondary | ICD-10-CM | POA: Diagnosis not present

## 2022-01-17 DIAGNOSIS — R220 Localized swelling, mass and lump, head: Secondary | ICD-10-CM | POA: Diagnosis not present

## 2022-01-17 DIAGNOSIS — Z85818 Personal history of malignant neoplasm of other sites of lip, oral cavity, and pharynx: Secondary | ICD-10-CM | POA: Diagnosis not present

## 2022-01-17 DIAGNOSIS — E785 Hyperlipidemia, unspecified: Secondary | ICD-10-CM | POA: Diagnosis not present

## 2022-02-07 ENCOUNTER — Other Ambulatory Visit: Payer: Self-pay | Admitting: Family

## 2022-02-07 DIAGNOSIS — J301 Allergic rhinitis due to pollen: Secondary | ICD-10-CM

## 2022-02-08 NOTE — Progress Notes (Incomplete)
Matthew [name] presents today for follow up for completion of radiation therapy to his head and neck. He completed treatment on 12-04-21.  Pain issues, if any: *** Using a feeding tube?: *** Weight changes, if any: *** Swallowing issues, if any: *** Smoking or chewing tobacco? *** Using fluoride trays daily? *** Last ENT visit was on: *** Other notable issues, if any: ***

## 2022-02-15 ENCOUNTER — Encounter (HOSPITAL_COMMUNITY): Admission: RE | Admit: 2022-02-15 | Payer: Medicare HMO | Source: Ambulatory Visit

## 2022-02-15 DIAGNOSIS — R6889 Other general symptoms and signs: Secondary | ICD-10-CM | POA: Diagnosis not present

## 2022-02-20 ENCOUNTER — Telehealth: Payer: Self-pay | Admitting: *Deleted

## 2022-02-20 NOTE — Telephone Encounter (Signed)
CALLED PATIENT'S SON- JUSTIN TO SEE WHEN HIS DAD WOULD BE WILLING TO RESCHEDULE HIS SCAN AND FU, LVM FOR A RETURN CALL

## 2022-02-21 ENCOUNTER — Ambulatory Visit
Admission: RE | Admit: 2022-02-21 | Discharge: 2022-02-21 | Disposition: A | Discharge status: Home / Self Care | Payer: Medicare HMO | Source: Ambulatory Visit | Attending: Radiation Oncology | Admitting: Radiation Oncology

## 2022-02-21 DIAGNOSIS — C760 Malignant neoplasm of head, face and neck: Secondary | ICD-10-CM | POA: Insufficient documentation

## 2022-02-22 DIAGNOSIS — J209 Acute bronchitis, unspecified: Secondary | ICD-10-CM | POA: Diagnosis not present

## 2022-02-22 DIAGNOSIS — R059 Cough, unspecified: Secondary | ICD-10-CM | POA: Diagnosis not present

## 2022-02-22 DIAGNOSIS — R0989 Other specified symptoms and signs involving the circulatory and respiratory systems: Secondary | ICD-10-CM | POA: Diagnosis not present

## 2022-02-22 DIAGNOSIS — R0981 Nasal congestion: Secondary | ICD-10-CM | POA: Diagnosis not present

## 2022-02-22 DIAGNOSIS — R0602 Shortness of breath: Secondary | ICD-10-CM | POA: Diagnosis not present

## 2022-02-22 DIAGNOSIS — Z720 Tobacco use: Secondary | ICD-10-CM | POA: Diagnosis not present

## 2022-02-22 DIAGNOSIS — Z1152 Encounter for screening for COVID-19: Secondary | ICD-10-CM | POA: Diagnosis not present

## 2022-02-22 DIAGNOSIS — J029 Acute pharyngitis, unspecified: Secondary | ICD-10-CM | POA: Diagnosis not present

## 2022-02-23 NOTE — Progress Notes (Signed)
Oncology Nurse Navigator Documentation   I called and left a voicemail with Mr. Flannery son [name]. I informed him of the date/time of his PET scan and follow up with Dr. Isidore Moos to receive results. He will arrange transportation for his father. I left my direct contact information to call with any questions or concerns.   Harlow Asa RN, BSN, OCN Head & Neck Oncology Nurse [place] at [facility] Phone # [phone]  Fax # [phone]

## 2022-03-06 NOTE — Progress Notes (Incomplete)
Mr. Savannah presents today for follow-up after completing radiation to his oropharynx on 11/14/2021   Pain issues, if any: *** Using a feeding tube?: *** Weight changes, if any: *** Swallowing issues, if any: *** Smoking or chewing tobacco? *** Using fluoride trays daily? *** Last ENT visit was on: *** Other notable issues, if any: ***  HPI: Last week he developed swelling of the left temporal area. He thought he might have been bitten by something. He went to his primary care who put him on Bactrim. 2 days later he ended up in a local emergency room and was hospitalized. A sample was taken from the drainage for culture which has not available yet. He was discharged today doing much better. He completed radiation several months ago for his right sided unknown primary.   Impression & Plans: Continue on Bactrim. Follow-up in 1 week. Follow-up sooner if it gets any worse. We will check on the cultures. CT scan at the local hospital did not reveal a drainable abscess.  Medical Decision Making: #/Compl Problems 2 Data Rev 1 Management 2 (1-Straightforward, 2-Low, 3- Moderate, 4-High)  Beckie Salts, MD   Electronically signed by: Beckie Salts, MD 12/05/21 [phone]

## 2022-03-09 ENCOUNTER — Ambulatory Visit (INDEPENDENT_AMBULATORY_CARE_PROVIDER_SITE_OTHER): Payer: Medicare HMO | Admitting: Family

## 2022-03-09 ENCOUNTER — Encounter: Payer: Self-pay | Admitting: Family

## 2022-03-09 VITALS — BP 123/73 | HR 62 | Temp 98.1°F | Ht 69.0 in | Wt 195.0 lb

## 2022-03-09 DIAGNOSIS — E1142 Type 2 diabetes mellitus with diabetic polyneuropathy: Secondary | ICD-10-CM | POA: Diagnosis not present

## 2022-03-09 DIAGNOSIS — Z79899 Other long term (current) drug therapy: Secondary | ICD-10-CM

## 2022-03-09 DIAGNOSIS — C7989 Secondary malignant neoplasm of other specified sites: Secondary | ICD-10-CM | POA: Diagnosis not present

## 2022-03-09 DIAGNOSIS — G8929 Other chronic pain: Secondary | ICD-10-CM

## 2022-03-09 DIAGNOSIS — E785 Hyperlipidemia, unspecified: Secondary | ICD-10-CM

## 2022-03-09 DIAGNOSIS — F4323 Adjustment disorder with mixed anxiety and depressed mood: Secondary | ICD-10-CM | POA: Diagnosis not present

## 2022-03-09 DIAGNOSIS — J449 Chronic obstructive pulmonary disease, unspecified: Secondary | ICD-10-CM

## 2022-03-09 DIAGNOSIS — J438 Other emphysema: Secondary | ICD-10-CM

## 2022-03-09 DIAGNOSIS — I4891 Unspecified atrial fibrillation: Secondary | ICD-10-CM

## 2022-03-09 DIAGNOSIS — I10 Essential (primary) hypertension: Secondary | ICD-10-CM

## 2022-03-09 DIAGNOSIS — M549 Dorsalgia, unspecified: Secondary | ICD-10-CM | POA: Diagnosis not present

## 2022-03-09 DIAGNOSIS — C801 Malignant (primary) neoplasm, unspecified: Secondary | ICD-10-CM

## 2022-03-09 DIAGNOSIS — G500 Trigeminal neuralgia: Secondary | ICD-10-CM

## 2022-03-09 DIAGNOSIS — F331 Major depressive disorder, recurrent, moderate: Secondary | ICD-10-CM | POA: Diagnosis not present

## 2022-03-09 DIAGNOSIS — E663 Overweight: Secondary | ICD-10-CM

## 2022-03-09 DIAGNOSIS — C760 Malignant neoplasm of head, face and neck: Secondary | ICD-10-CM

## 2022-03-09 DIAGNOSIS — Z8673 Personal history of transient ischemic attack (TIA), and cerebral infarction without residual deficits: Secondary | ICD-10-CM

## 2022-03-09 DIAGNOSIS — Z720 Tobacco use: Secondary | ICD-10-CM

## 2022-03-09 LAB — BAYER DCA HB A1C WAIVED: HB A1C (BAYER DCA - WAIVED): 5.9 % — ABNORMAL HIGH (ref 4.8–5.6)

## 2022-03-09 MED ORDER — HYDROCODONE-ACETAMINOPHEN 5-325 MG PO TABS: 1.0000 | ORAL_TABLET | Freq: Three times a day (TID) | ORAL | 0 refills | Status: DC | PRN

## 2022-03-09 NOTE — Progress Notes (Signed)
Subjective:    Patient ID: Matthew [name], male    DOB: [birth year], 62 y.o.   MRN: [id number]  Chief Complaint  Patient presents with   Medical Management of Chronic Issues   PT presents to the office today for chronic follow up and pain management. He has hx of facial trauma and mandible fracture on 04/05/21. He is also being treated for metastatic squamous neck cancer. He completed radiation. He is followed by Oncologists. He is scheduled for PET scan tomorrow. Report soreness and burning of 5 out 10.   He has COPD and continues to smoke a 1 pack a day.   He is a diabetic, HTN, COPD, and hx of CVA. He takes Eliquis daily.    Has trigeminal neuralgia and was followed by Neurologists.    Has thoracic aortic arteriosclerosis and CAD and takes Lipitor daily.    Has COPD and he continues to smoke a pack a day. Using Trelegy. Hypertension This is a chronic problem. The current episode started more than 1 year ago. The problem has been resolved since onset. Associated symptoms include anxiety, blurred vision, malaise/fatigue and shortness of breath. Pertinent negatives include no peripheral edema. Risk factors for coronary artery disease include diabetes mellitus, dyslipidemia, obesity and male gender. The current treatment provides moderate improvement.  Diabetes He presents for his follow-up diabetic visit. He has type 2 diabetes mellitus. Hypoglycemia symptoms include nervousness/anxiousness. Associated symptoms include blurred vision, fatigue and foot paresthesias. Symptoms are stable. Diabetic complications include peripheral neuropathy. Risk factors for coronary artery disease include dyslipidemia, diabetes mellitus, hypertension, sedentary lifestyle and post-menopausal. He is following a generally healthy diet. His overall blood glucose range is 90-110 mg/dl.  Hyperlipidemia This is a chronic problem. The current episode started more than 1 year ago. The problem is controlled. Recent lipid  tests were reviewed and are normal. Associated symptoms include shortness of breath. Current antihyperlipidemic treatment includes statins. The current treatment provides moderate improvement of lipids. Risk factors for coronary artery disease include dyslipidemia, diabetes mellitus, hypertension, a sedentary lifestyle and post-menopausal.  Depression        This is a chronic problem.  The current episode started more than 1 year ago.   The onset quality is gradual.   The problem occurs intermittently.  Associated symptoms include fatigue, helplessness, hopelessness, restlessness and sad.  Past medical history includes anxiety.   Back Pain This is a chronic problem. The current episode started more than 1 year ago. The problem has been resolved since onset. The pain is present in the lumbar spine. The pain is at a severity of 0/10.  Anxiety Presents for follow-up visit. Symptoms include depressed mood, excessive worry, nervous/anxious behavior, restlessness and shortness of breath.        Review of Systems  Constitutional:  Positive for fatigue and malaise/fatigue.  Eyes:  Positive for blurred vision.  Respiratory:  Positive for shortness of breath.   Musculoskeletal:  Positive for back pain.  Psychiatric/Behavioral:  Positive for depression. The patient is nervous/anxious.   All other systems reviewed and are negative.      Objective:   Physical Exam Vitals reviewed.  Constitutional:      General: He is not in acute distress.    Appearance: He is well-developed.       Comments: Large mass in throat, no tenderness  HENT:     Head: Normocephalic.  Eyes:     General:        Right eye: No discharge.  Left eye: No discharge.     Pupils: Pupils are equal, round, and reactive to light.  Neck:     Thyroid: No thyromegaly.  Cardiovascular:     Rate and Rhythm: Normal rate and regular rhythm.     Heart sounds: Normal heart sounds. No murmur heard. Pulmonary:     Effort:  Pulmonary effort is normal. No respiratory distress.     Breath sounds: Normal breath sounds. No wheezing.  Abdominal:     General: Bowel sounds are normal. There is no distension.     Palpations: Abdomen is soft.     Tenderness: There is no abdominal tenderness.  Musculoskeletal:        General: No tenderness. Normal range of motion.     Cervical back: Normal range of motion and neck supple.  Skin:    General: Skin is warm and dry.     Findings: No erythema or rash.  Neurological:     Mental Status: He is alert and oriented to person, place, and time.     Cranial Nerves: No cranial nerve deficit.     Deep Tendon Reflexes: Reflexes are normal and symmetric.  Psychiatric:        Behavior: Behavior normal.        Thought Content: Thought content normal.        Judgment: Judgment normal.    BP 123/73   Pulse 62   Temp 98.1 F (36.7 C) (Temporal)   Ht '5\' 9"'$  (1.753 m)   Wt 195 lb (88.5 kg)   SpO2 96%   BMI 28.80 kg/m       Assessment & Plan:  Matthew [name] comes in today with chief complaint of Medical Management of Chronic Issues   Diagnosis and orders addressed:  1. Type 2 diabetes mellitus with diabetic polyneuropathy, without long-term current use of insulin (HCC) - Bayer DCA Hb A1c Waived - CMP14+EGFR - Lipid panel - CBC with Differential/Platelet  2. Controlled substance agreement signed - HYDROcodone-acetaminophen (NORCO) 5-325 MG tablet; Take 1 tablet by mouth every 8 (eight) hours as needed for moderate pain.  Dispense: 90 tablet; Refill: 0 - HYDROcodone-acetaminophen (NORCO) 5-325 MG tablet; Take 1 tablet by mouth every 8 (eight) hours as needed for moderate pain.  Dispense: 90 tablet; Refill: 0 - HYDROcodone-acetaminophen (NORCO/VICODIN) 5-325 MG tablet; Take 1 tablet by mouth every 8 (eight) hours as needed for moderate pain.  Dispense: 90 tablet; Refill: 0  3. Head and neck cancer (HCC) - HYDROcodone-acetaminophen (NORCO) 5-325 MG tablet; Take 1 tablet by  mouth every 8 (eight) hours as needed for moderate pain.  Dispense: 90 tablet; Refill: 0 - HYDROcodone-acetaminophen (NORCO) 5-325 MG tablet; Take 1 tablet by mouth every 8 (eight) hours as needed for moderate pain.  Dispense: 90 tablet; Refill: 0 - HYDROcodone-acetaminophen (NORCO/VICODIN) 5-325 MG tablet; Take 1 tablet by mouth every 8 (eight) hours as needed for moderate pain.  Dispense: 90 tablet; Refill: 0  4. Metastatic squamous neck cancer with occult primary (HCC) - HYDROcodone-acetaminophen (NORCO) 5-325 MG tablet; Take 1 tablet by mouth every 8 (eight) hours as needed for moderate pain.  Dispense: 90 tablet; Refill: 0 - HYDROcodone-acetaminophen (NORCO) 5-325 MG tablet; Take 1 tablet by mouth every 8 (eight) hours as needed for moderate pain.  Dispense: 90 tablet; Refill: 0 - HYDROcodone-acetaminophen (NORCO/VICODIN) 5-325 MG tablet; Take 1 tablet by mouth every 8 (eight) hours as needed for moderate pain.  Dispense: 90 tablet; Refill: 0  5. Adjustment disorder with mixed anxiety  and depressed mood  6. Atrial fibrillation with RVR ([place])  7. Chronic midline back pain, unspecified back location  8. Other emphysema ([place])  9. Moderate episode of recurrent major depressive disorder ([place])  10. H/O: CVA (cerebrovascular accident)  63. Primary hypertension  12. Hyperlipidemia, unspecified hyperlipidemia type  13. Trigeminal neuralgia  14. Tobacco use  15. Overweight (BMI 25.0-29.9)   Labs pending Patient reviewed in [place] controlled database, no flags noted. Contract and drug screen are up to date.  Health Maintenance reviewed Diet and exercise encouraged  Follow up plan: 3 months    Evelina Dun, FNP

## 2022-03-09 NOTE — Patient Instructions (Signed)
Throat Cancer Throat cancer is an abnormal growth of cancerous cells (malignant tumors) in the throat. There are different types of throat cancer based on the affected part of the throat: Oropharyngeal cancer. The cancer affects the base of the tongue, the tonsils, the soft palate, and the back of the throat (oropharynx). Laryngeal cancer. The cancer affects the voice box (larynx). Hypopharyngeal cancer. The cancer affects the lower throat (hypopharynx). What increases the risk? You are more likely to develop throat cancer if you: Use any tobacco products, including cigarettes, chewing tobacco, and e-cigarettes. Drink alcohol excessively. Chew betel quid. Betel quid is a mix of areca nut, tobacco, lime, and seeds wrapped in a betel leaf. Have a history of human papillomavirus (HPV) infection. Have been exposed to certain chemicals such as nickel, asbestos, or sulfuric acid fumes. What are the signs or symptoms? Symptoms may include: A sore throat. A lump, growth, or sore in the throat that does not get better. Trouble swallowing. Ear pain. Cough. Weight loss without trying (unintentional weight loss). Hoarse voice that does not go away. How is this diagnosed? This condition may be diagnosed based on: Your symptoms and medical history. A physical exam of the throat. A tube with a light and a camera on the end of it (endoscope or laryngoscope) may be used to examine your throat. Imaging tests, such as: CT scan of the neck. PET scan. MRI. X-rays of the chest and mouth. Removal of a sample of tumor tissue to be tested (biopsy). Tests to determine how well you are able to swallow, such as: Barium swallow. This is a procedure in which you swallow a solution (barium) before X-rays are done to evaluate your throat and other structures. The barium shows up well on X-rays, making it easier for your health care provider to see possible problems. Fiberoptic endoscopic examination (FEES): During  this procedure, a small, flexible endoscope is inserted through your nose to examine your ability to swallow. Your cancer will be assessed (staged) to determine how severe it is and how much it has spread (metastasized). This information will be used to discuss treatment options. How is this treated? Treatment for throat cancer depends on the type and stage of the cancer. Treatment may include one or more of the following: Surgery to remove as much of the cancer as possible. This surgery may also involve removing lymph nodes in the area to be checked for cancer cells. High-energy rays that kill cancer cells (radiation therapy). Medicines that kill cancer cells (chemotherapy). Medicines that help your body's disease-fighting system (immune system) fight cancer cells (immunotherapy). Targeted therapy. This targets specific parts of cancer cells and the area around them to block the growth and the spread of the cancer. Targeted therapy can help to limit the damage to healthy cells. Follow these instructions at home: Lifestyle Do not drink alcohol. Do not use any products that contain nicotine or tobacco. These products include cigarettes, chewing tobacco, and vaping devices, such as e-cigarettes. If you need help quitting, ask your health care provider. Eating and drinking Some of your treatments might affect your appetite and your ability to chew and swallow. If you are having problems eating, or if you do not have an appetite, meet with a dietitian. If you have side effects that affect eating, it may help to: Eat smaller meals and snacks often. Drink high-nutrition and high-calorie shakes or supplements. Eat bland and soft foods that are easy to eat. Avoid foods that are hot, spicy, or hard  to swallow. General instructions Take over-the-counter and prescription medicines only as told by your health care provider. Consider joining a support group for people who have been diagnosed with throat  cancer. Work with your health care provider to manage any side effects of treatment. Keep all follow-up visits. This is important. Where to find more information [employer] [place]): www.cancer.gov American Cancer Society: www.cancer.org Contact a health care provider if: You have a fever. You continue to lose weight without trying. You have more problems chewing or swallowing. You have new fatigue or weakness. You have new symptoms, or your symptoms get worse. You have pain that suddenly gets worse. Get help right away if: You cough up blood. You have trouble breathing. You faint. These symptoms may be an emergency. Get help right away. Call 911. Do not wait to see if the symptoms will go away. Do not drive yourself to the hospital. Summary Throat cancer is an abnormal growth of cancerous cells (malignant tumors) in the throat. Your cancer will be assessed (staged) to determine how severe it is and how much it has spread (metastasized). This will determine treatment options. Work with your health care provider to manage any side effects of treatment. This information is not intended to replace advice given to you by your health care provider. Make sure you discuss any questions you have with your health care provider. Document Revised: 12/31/2020 Document Reviewed: 12/31/2020 Elsevier Patient Education  [place].

## 2022-03-10 ENCOUNTER — Encounter (HOSPITAL_COMMUNITY)
Admission: RE | Admit: 2022-03-10 | Discharge: 2022-03-10 | Disposition: A | Discharge status: Home / Self Care | Payer: Medicare HMO | Source: Ambulatory Visit | Attending: Radiation Oncology | Admitting: Radiation Oncology

## 2022-03-10 ENCOUNTER — Telehealth: Payer: Self-pay | Admitting: Family

## 2022-03-10 DIAGNOSIS — I2510 Atherosclerotic heart disease of native coronary artery without angina pectoris: Secondary | ICD-10-CM | POA: Diagnosis not present

## 2022-03-10 DIAGNOSIS — M47816 Spondylosis without myelopathy or radiculopathy, lumbar region: Secondary | ICD-10-CM | POA: Diagnosis not present

## 2022-03-10 DIAGNOSIS — R6889 Other general symptoms and signs: Secondary | ICD-10-CM | POA: Diagnosis not present

## 2022-03-10 DIAGNOSIS — I6523 Occlusion and stenosis of bilateral carotid arteries: Secondary | ICD-10-CM | POA: Diagnosis not present

## 2022-03-10 DIAGNOSIS — C760 Malignant neoplasm of head, face and neck: Secondary | ICD-10-CM

## 2022-03-10 LAB — LIPID PANEL
Chol/HDL Ratio: 2.8 ratio (ref 0.0–5.0)
Cholesterol, Total: 131 mg/dL (ref 100–199)
HDL: 47 mg/dL (ref >39)
LDL Chol Calc (NIH): 57 mg/dL (ref 0–99)
Triglycerides: 160 mg/dL — ABNORMAL HIGH (ref 0–149)
VLDL Cholesterol Cal: 27 mg/dL (ref 5–40)

## 2022-03-10 LAB — CBC WITH DIFFERENTIAL/PLATELET
Basophils Absolute: 0 10*3/uL (ref 0.0–0.2)
Basos: 0 %
EOS (ABSOLUTE): 0.2 10*3/uL (ref 0.0–0.4)
Eos: 2 %
Hematocrit: 40.3 % (ref 37.5–51.0)
Hemoglobin: 13.5 g/dL (ref 13.0–17.7)
Immature Grans (Abs): 0 10*3/uL (ref 0.0–0.1)
Immature Granulocytes: 0 %
Lymphocytes Absolute: 0.8 10*3/uL (ref 0.7–3.1)
Lymphs: 11 %
MCH: 29 pg (ref 26.6–33.0)
MCHC: 33.5 g/dL (ref 31.5–35.7)
MCV: 87 fL (ref 79–97)
Monocytes Absolute: 0.7 10*3/uL (ref 0.1–0.9)
Monocytes: 9 %
Neutrophils Absolute: 5.4 10*3/uL (ref 1.4–7.0)
Neutrophils: 78 %
Platelets: 259 10*3/uL (ref 150–450)
RBC: 4.66 x10E6/uL (ref 4.14–5.80)
RDW: 13 % (ref 11.6–15.4)
WBC: 7.1 10*3/uL (ref 3.4–10.8)

## 2022-03-10 LAB — CMP14+EGFR
ALT: 16 IU/L (ref 0–44)
AST: 13 IU/L (ref 0–40)
Albumin/Globulin Ratio: 1.7 (ref 1.2–2.2)
Albumin: 4 g/dL (ref 3.9–4.9)
Alkaline Phosphatase: 98 IU/L (ref 44–121)
BUN/Creatinine Ratio: 20 (ref 10–24)
BUN: 11 mg/dL (ref 8–27)
Bilirubin Total: <0.2 mg/dL (ref 0.0–1.2)
CO2: 20 mmol/L (ref 20–29)
Calcium: 8.9 mg/dL (ref 8.6–10.2)
Chloride: 101 mmol/L (ref 96–106)
Creatinine, Ser: 0.56 mg/dL — ABNORMAL LOW (ref 0.76–1.27)
Globulin, Total: 2.4 g/dL (ref 1.5–4.5)
Glucose: 105 mg/dL — ABNORMAL HIGH (ref 70–99)
Potassium: 4 mmol/L (ref 3.5–5.2)
Sodium: 138 mmol/L (ref 134–144)
Total Protein: 6.4 g/dL (ref 6.0–8.5)
eGFR: 111 mL/min/{1.73_m2} (ref >59)

## 2022-03-10 LAB — GLUCOSE, CAPILLARY: Glucose-Capillary: 111 mg/dL — ABNORMAL HIGH (ref 70–99)

## 2022-03-10 MED ORDER — FLUDEOXYGLUCOSE F - 18 (FDG) INJECTION
9.7000 | Freq: Once | INTRAVENOUS | Status: AC
  Administered 2022-03-10: 9.68 via INTRAVENOUS

## 2022-03-10 NOTE — Telephone Encounter (Signed)
Patient aware and verbalizes understanding. 

## 2022-03-10 NOTE — Telephone Encounter (Signed)
Does not need an antibiotic at this time.

## 2022-03-15 ENCOUNTER — Ambulatory Visit
Admission: RE | Admit: 2022-03-15 | Discharge: 2022-03-15 | Disposition: A | Discharge status: Home / Self Care | Payer: Medicare HMO | Source: Ambulatory Visit | Attending: Radiation Oncology | Admitting: Radiation Oncology

## 2022-03-15 ENCOUNTER — Telehealth: Payer: Self-pay

## 2022-03-15 DIAGNOSIS — C760 Malignant neoplasm of head, face and neck: Secondary | ICD-10-CM

## 2022-03-15 NOTE — Telephone Encounter (Signed)
Rn called pt son with PET scan results and son was relieved that everything looked good overall. They were agreeable to to come in on Tuesday the 6th at 3:40pm per Dr. Isidore Moos request. Pt son stated they have had issues with rides through insurance [facility]) but he assured RN they would work on it for Tuesday appointment.

## 2022-03-15 NOTE — Telephone Encounter (Signed)
RN attempted to call pt when he did not come to his appointment, pt voice mail was not set up. Rn then called pt son and was able to get a hold of him. He stated that the pt thought his appointment was on Monday instead of Wednesday. Pt son is wondering if his dad needs to come in or if we can give PET results over the phone. Rn will discuss this Dr. Isidore Moos and follow up with pt son when feedback is given.

## 2022-03-17 NOTE — Progress Notes (Incomplete)
Mr. Fancher presents today for follow-up after completing radiation to his oropharynx on 11/14/2021    Pain issues, if any: *** Using a feeding tube?: *** Weight changes, if any: *** Swallowing issues, if any: *** Smoking or chewing tobacco? *** Using fluoride trays daily? *** Last ENT visit was on: *** Other notable issues, if any: ***   HPI: Last week he developed swelling of the left temporal area. He thought he might have been bitten by something. He went to his primary care who put him on Bactrim. 2 days later he ended up in a local emergency room and was hospitalized. A sample was taken from the drainage for culture which has not available yet. He was discharged today doing much better. He completed radiation several months ago for his right sided unknown primary.    Impression & Plans: Continue on Bactrim. Follow-up in 1 week. Follow-up sooner if it gets any worse. We will check on the cultures. CT scan at the local hospital did not reveal a drainable abscess.  Medical Decision Making: #/Compl Problems 2 Data Rev 1 Management 2 (1-Straightforward, 2-Low, 3- Moderate, 4-High)  Beckie Salts, MD   Electronically signed by: Beckie Salts, MD 12/05/21 [phone]

## 2022-03-21 ENCOUNTER — Ambulatory Visit
Admission: RE | Admit: 2022-03-21 | Discharge: 2022-03-21 | Disposition: A | Discharge status: Home / Self Care | Payer: Medicare HMO | Source: Ambulatory Visit | Attending: Radiation Oncology | Admitting: Radiation Oncology

## 2022-03-21 ENCOUNTER — Telehealth: Payer: Self-pay

## 2022-03-21 DIAGNOSIS — C760 Malignant neoplasm of head, face and neck: Secondary | ICD-10-CM

## 2022-03-21 NOTE — Telephone Encounter (Signed)
RN called and left message for pt son when his father did not show for rescheduled follow  up appointment. Rn attempted to call pt but his voicemail was not set up . Rn encouraged pt son to call when he could.

## 2022-03-26 DIAGNOSIS — E119 Type 2 diabetes mellitus without complications: Secondary | ICD-10-CM | POA: Diagnosis not present

## 2022-03-26 DIAGNOSIS — J449 Chronic obstructive pulmonary disease, unspecified: Secondary | ICD-10-CM | POA: Diagnosis not present

## 2022-03-26 DIAGNOSIS — Z720 Tobacco use: Secondary | ICD-10-CM | POA: Diagnosis not present

## 2022-03-26 DIAGNOSIS — L040 Acute lymphadenitis of face, head and neck: Secondary | ICD-10-CM | POA: Diagnosis not present

## 2022-03-26 DIAGNOSIS — I10 Essential (primary) hypertension: Secondary | ICD-10-CM | POA: Diagnosis not present

## 2022-03-26 DIAGNOSIS — L049 Acute lymphadenitis, unspecified: Secondary | ICD-10-CM | POA: Diagnosis not present

## 2022-03-26 DIAGNOSIS — E785 Hyperlipidemia, unspecified: Secondary | ICD-10-CM | POA: Diagnosis not present

## 2022-03-26 DIAGNOSIS — C140 Malignant neoplasm of pharynx, unspecified: Secondary | ICD-10-CM | POA: Diagnosis not present

## 2022-03-26 DIAGNOSIS — F39 Unspecified mood [affective] disorder: Secondary | ICD-10-CM | POA: Diagnosis not present

## 2022-04-04 ENCOUNTER — Telehealth: Payer: Self-pay | Admitting: Family

## 2022-04-04 NOTE — Telephone Encounter (Signed)
Called patient to schedule Medicare Annual Wellness Visit (AWV). No voicemail available to leave a message.  Last date of AWV: 03/23/2021   Please schedule an appointment at any time with either Mickel Baas or [place], NHA's. .  If any questions, please contact me at [phone].  Thank you,  Colletta Maryland,  [place] Program Direct Dial ??[phone]

## 2022-04-05 ENCOUNTER — Other Ambulatory Visit: Payer: Self-pay | Admitting: Family

## 2022-04-05 DIAGNOSIS — E785 Hyperlipidemia, unspecified: Secondary | ICD-10-CM

## 2022-04-05 DIAGNOSIS — I10 Essential (primary) hypertension: Secondary | ICD-10-CM

## 2022-04-05 NOTE — Progress Notes (Signed)
Oncology Nurse Navigator Documentation   I left a VM message with Matthew [name] son, Matthew [name]. I let him know that Dr. Isidore Moos would like him to schedule an appointment with her for post radiation follow up. I informed him that his Dad still needs to come for post treatment evaluations several times yearly along with seeing his ENT to assess for cancer recurrence. I gave him my direct contact information and asked him to call me to get an appointment set up as soon as possible.  Harlow Asa RN, BSN, OCN Head & Neck Oncology [facility] Phone # [phone]  Fax # [phone]

## 2022-04-10 ENCOUNTER — Telehealth: Payer: Self-pay | Admitting: Family

## 2022-04-10 NOTE — Telephone Encounter (Signed)
Offered a video visit today but patient does not want to to do that.  Scheduled him on 04/12/22 at 9:30 am with Marjorie Smolder.

## 2022-04-12 ENCOUNTER — Ambulatory Visit (INDEPENDENT_AMBULATORY_CARE_PROVIDER_SITE_OTHER): Payer: Medicare HMO | Admitting: Family Medicine

## 2022-04-12 ENCOUNTER — Encounter: Payer: Self-pay | Admitting: Family Medicine

## 2022-04-12 VITALS — BP 109/69 | HR 59 | Temp 98.0°F | Ht 69.0 in | Wt 201.0 lb

## 2022-04-12 DIAGNOSIS — R6884 Jaw pain: Secondary | ICD-10-CM | POA: Diagnosis not present

## 2022-04-12 DIAGNOSIS — C760 Malignant neoplasm of head, face and neck: Secondary | ICD-10-CM

## 2022-04-12 DIAGNOSIS — G8929 Other chronic pain: Secondary | ICD-10-CM | POA: Diagnosis not present

## 2022-04-12 DIAGNOSIS — K1379 Other lesions of oral mucosa: Secondary | ICD-10-CM

## 2022-04-12 NOTE — Progress Notes (Signed)
   Acute Office Visit  Subjective:     Patient ID: Matthew [name], male    DOB: 31-Jan-1961, 62 y.o.   MRN: [id number]  Chief Complaint  Patient presents with   Oral Pain    Oral Pain    Patient is in today for chronic mouth and jaw pain. He reports sharp pains that occur typically when eating. He is currently undergoing radiation treatments for head and neck cancer. He has a history of a jaw fracture that was not surgically fixed. He currently takes norco, gabapentin, Cymbalta, and baclofen with mild improvement. He has tried magic mouthwash which was helpful short term but his insurance will not cover this and he cannot afford it out of pocket. Denies lesions.   ROS As per HPI.      Objective:    BP 109/69   Pulse (!) 59   Temp 98 F (36.7 C) (Temporal)   Ht 5' 9"$  (1.753 m)   Wt 201 lb (91.2 kg)   SpO2 96%   BMI 29.68 kg/m    Physical Exam Vitals and nursing note reviewed.  Constitutional:      General: He is not in acute distress.    Appearance: He is not ill-appearing, toxic-appearing or diaphoretic.  HENT:     Mouth/Throat:     Mouth: Mucous membranes are moist. No injury, lacerations, oral lesions or angioedema.     Dentition: Abnormal dentition (edentulous). No dental tenderness, gingival swelling or gum lesions.     Tongue: No lesions.     Palate: No mass and lesions.     Pharynx: Oropharynx is clear. No pharyngeal swelling, oropharyngeal exudate, posterior oropharyngeal erythema or uvula swelling.     Tonsils: No tonsillar exudate or tonsillar abscesses.  Cardiovascular:     Rate and Rhythm: Normal rate and regular rhythm.  Pulmonary:     Effort: Pulmonary effort is normal. No respiratory distress.  Musculoskeletal:     Right lower leg: No edema.     Left lower leg: No edema.  Skin:    General: Skin is warm and dry.  Neurological:     General: No focal deficit present.     Mental Status: He is alert and oriented to person, place, and time.  Psychiatric:         Mood and Affect: Mood normal.        Behavior: Behavior normal.     No results found for any visits on 04/12/22.      Assessment & Plan:   Matthew [name] was seen today for oral pain.  Diagnoses and all orders for this visit:  Mouth pain Chronic jaw pain Head and neck cancer (HCC) Chronic pain due to radiation treatments and hx of mandible fracture. Unfortunately he is already on baclofen, gapabentin, norco, and cymbalta. Discussed oral hygiene. Continue follow up with PCP and oncology.   The patient indicates understanding of these issues and agrees with the plan.  Gwenlyn Perking, FNP

## 2022-05-02 ENCOUNTER — Other Ambulatory Visit: Payer: Self-pay | Admitting: Family

## 2022-05-02 DIAGNOSIS — G500 Trigeminal neuralgia: Secondary | ICD-10-CM

## 2022-05-31 ENCOUNTER — Other Ambulatory Visit: Payer: Self-pay | Admitting: Family

## 2022-05-31 DIAGNOSIS — J441 Chronic obstructive pulmonary disease with (acute) exacerbation: Secondary | ICD-10-CM

## 2022-06-02 ENCOUNTER — Telehealth: Payer: Self-pay | Admitting: Family

## 2022-06-02 NOTE — Telephone Encounter (Signed)
Called patient to schedule Medicare Annual Wellness Visit (AWV). No voicemail available to leave a message.  Last date of AWV: 03/23/2021   Please schedule an appointment at any time with either Laura or Courtney, NHA's. .  If any questions, please contact me at [phone].  Thank you,  Stephanie,  AMB Clinical Support [facility] AWV Program Direct Dial ??[phone]    

## 2022-06-06 ENCOUNTER — Telehealth: Payer: Self-pay | Admitting: Family

## 2022-06-06 NOTE — Telephone Encounter (Signed)
Called patient to schedule Medicare Annual Wellness Visit (AWV). No voicemail available to leave a message.  Last date of AWV: 03/23/2021   Please schedule an appointment at any time with Vernona Rieger, [facility]. .  If any questions, please contact me at [phone].  Thank you,  Judeth Cornfield,  AMB Clinical Support [facility] AWV Program Direct Dial ??[phone]

## 2022-06-13 ENCOUNTER — Encounter: Payer: Self-pay | Admitting: Family

## 2022-06-13 ENCOUNTER — Ambulatory Visit (INDEPENDENT_AMBULATORY_CARE_PROVIDER_SITE_OTHER): Payer: Medicare HMO | Admitting: Family

## 2022-06-13 VITALS — BP 125/77 | HR 61 | Temp 98.2°F | Ht 69.0 in | Wt 188.0 lb

## 2022-06-13 DIAGNOSIS — G8929 Other chronic pain: Secondary | ICD-10-CM | POA: Diagnosis not present

## 2022-06-13 DIAGNOSIS — I4891 Unspecified atrial fibrillation: Secondary | ICD-10-CM

## 2022-06-13 DIAGNOSIS — E1142 Type 2 diabetes mellitus with diabetic polyneuropathy: Secondary | ICD-10-CM | POA: Diagnosis not present

## 2022-06-13 DIAGNOSIS — M549 Dorsalgia, unspecified: Secondary | ICD-10-CM

## 2022-06-13 DIAGNOSIS — F4323 Adjustment disorder with mixed anxiety and depressed mood: Secondary | ICD-10-CM | POA: Diagnosis not present

## 2022-06-13 DIAGNOSIS — Z79899 Other long term (current) drug therapy: Secondary | ICD-10-CM

## 2022-06-13 DIAGNOSIS — Z720 Tobacco use: Secondary | ICD-10-CM

## 2022-06-13 DIAGNOSIS — E663 Overweight: Secondary | ICD-10-CM

## 2022-06-13 DIAGNOSIS — I10 Essential (primary) hypertension: Secondary | ICD-10-CM | POA: Diagnosis not present

## 2022-06-13 DIAGNOSIS — C801 Malignant (primary) neoplasm, unspecified: Secondary | ICD-10-CM | POA: Diagnosis not present

## 2022-06-13 DIAGNOSIS — F331 Major depressive disorder, recurrent, moderate: Secondary | ICD-10-CM

## 2022-06-13 DIAGNOSIS — C7989 Secondary malignant neoplasm of other specified sites: Secondary | ICD-10-CM

## 2022-06-13 DIAGNOSIS — J438 Other emphysema: Secondary | ICD-10-CM

## 2022-06-13 DIAGNOSIS — I6930 Unspecified sequelae of cerebral infarction: Secondary | ICD-10-CM

## 2022-06-13 DIAGNOSIS — Z23 Encounter for immunization: Secondary | ICD-10-CM

## 2022-06-13 DIAGNOSIS — C760 Malignant neoplasm of head, face and neck: Secondary | ICD-10-CM | POA: Diagnosis not present

## 2022-06-13 DIAGNOSIS — E785 Hyperlipidemia, unspecified: Secondary | ICD-10-CM

## 2022-06-13 DIAGNOSIS — Z8673 Personal history of transient ischemic attack (TIA), and cerebral infarction without residual deficits: Secondary | ICD-10-CM

## 2022-06-13 DIAGNOSIS — I480 Paroxysmal atrial fibrillation: Secondary | ICD-10-CM

## 2022-06-13 LAB — BAYER DCA HB A1C WAIVED: HB A1C (BAYER DCA - WAIVED): 5.5 % (ref 4.8–5.6)

## 2022-06-13 MED ORDER — HYDROCODONE-ACETAMINOPHEN 5-325 MG PO TABS: 1.0000 | ORAL_TABLET | Freq: Three times a day (TID) | ORAL | 0 refills | Status: DC | PRN

## 2022-06-13 MED ORDER — CETIRIZINE HCL 10 MG PO TABS
10.0000 mg | ORAL_TABLET | Freq: Every day | ORAL | 1 refills | Status: DC
Start: 2022-06-13 — End: 2022-12-08

## 2022-06-13 NOTE — Addendum Note (Signed)
Addended by: Ignacia Bayley on: 06/13/2022 11:42 AM   Modules accepted: Orders

## 2022-06-13 NOTE — Progress Notes (Signed)
Subjective:    Patient ID: Matthew [name], male    DOB: Dec 29, 1960, 62 y.o.   MRN: [id number]  Chief Complaint  Patient presents with   Medical Management of Chronic Issues   Allergies   PT presents to the office today for chronic follow up and pain management. He has hx of facial trauma and mandible fracture on 04/05/21. He is also being treated for metastatic squamous neck cancer. He completed radiation. He is followed by Oncologists. He had PET scan on 03/10/22 that showed, "1. Interval resolution of previously demonstrated hypermetabolic right cervical adenopathy consistent with response to therapy. 2. No evidence of residual hypermetabolic disease in the neck, chest, abdomen or pelvis. 3. Clustered nodularity in the left lower lobe and lingula without associated hypermetabolic activity, likely postinflammatory."  Report soreness and burning of 5 out 10.   He has COPD and continues to smoke a 1 pack a day. Has intermittent SOB that is worse with pollen. Uses Trelegy.   He is a diabetic, HTN, COPD, and hx of CVA. He takes Eliquis daily.    Has trigeminal neuralgia and was followed by Neurologists.    Has thoracic aortic arteriosclerosis and CAD and takes Lipitor daily. Hypertension This is a chronic problem. The current episode started more than 1 year ago. The problem has been waxing and waning since onset. The problem is controlled. Associated symptoms include malaise/fatigue. Pertinent negatives include no blurred vision, peripheral edema or shortness of breath. Risk factors for coronary artery disease include dyslipidemia, diabetes mellitus, male gender, sedentary lifestyle and smoking/tobacco exposure. The current treatment provides moderate improvement. There is no history of CVA.  Diabetes He presents for his follow-up diabetic visit. He has type 2 diabetes mellitus. Associated symptoms include foot paresthesias. Pertinent negatives for diabetes include no blurred vision and no  fatigue. Symptoms are stable. Diabetic complications include peripheral neuropathy. Pertinent negatives for diabetic complications include no CVA or heart disease. Risk factors for coronary artery disease include diabetes mellitus, dyslipidemia, hypertension, sedentary lifestyle and male sex. He is following a generally healthy diet. (Does not check BS at home)  Nicotine Dependence Presents for follow-up visit. Symptoms are negative for fatigue. His urge triggers include company of smokers. The symptoms have been stable. He smokes 1 pack of cigarettes per day.  Hyperlipidemia This is a chronic problem. The current episode started more than 1 year ago. The problem is controlled. Recent lipid tests were reviewed and are normal. Exacerbating diseases include obesity. Pertinent negatives include no shortness of breath. Current antihyperlipidemic treatment includes statins. The current treatment provides moderate improvement of lipids. Risk factors for coronary artery disease include dyslipidemia, diabetes mellitus, hypertension, male sex and a sedentary lifestyle.  Depression        This is a chronic problem.  The current episode started more than 1 year ago.   The onset quality is gradual.   The problem occurs intermittently.  Associated symptoms include no fatigue, no helplessness, no hopelessness and not sad.  Past treatments include SNRIs - Serotonin and norepinephrine reuptake inhibitors. Back Pain This is a chronic problem. The current episode started more than 1 year ago. The problem occurs intermittently. The problem has been waxing and waning since onset. The pain is present in the lumbar spine. The quality of the pain is described as aching. The pain is at a severity of 5/10. The pain is moderate. He has tried analgesics for the symptoms. The treatment provided moderate relief.      Review  of Systems  Constitutional:  Positive for malaise/fatigue. Negative for fatigue.  Eyes:  Negative for  blurred vision.  Respiratory:  Negative for shortness of breath.   Musculoskeletal:  Positive for back pain.  Psychiatric/Behavioral:  Positive for depression.   All other systems reviewed and are negative.      Objective:   Physical Exam Vitals reviewed.  Constitutional:      General: He is not in acute distress.    Appearance: He is well-developed. He is obese.  HENT:     Head: Normocephalic.     Right Ear: Tympanic membrane normal.     Left Ear: Tympanic membrane normal.  Eyes:     General:        Right eye: No discharge.        Left eye: No discharge.     Pupils: Pupils are equal, round, and reactive to light.  Neck:     Thyroid: No thyromegaly.  Cardiovascular:     Rate and Rhythm: Normal rate and regular rhythm.     Heart sounds: Normal heart sounds. No murmur heard. Pulmonary:     Effort: Pulmonary effort is normal. No respiratory distress.     Breath sounds: Normal breath sounds. No wheezing.  Abdominal:     General: Bowel sounds are normal. There is no distension.     Palpations: Abdomen is soft.     Tenderness: There is no abdominal tenderness.  Musculoskeletal:        General: No tenderness. Normal range of motion.     Cervical back: Normal range of motion and neck supple.  Skin:    General: Skin is warm and dry.     Findings: No erythema or rash.  Neurological:     Mental Status: He is alert and oriented to person, place, and time.     Cranial Nerves: No cranial nerve deficit.     Deep Tendon Reflexes: Reflexes are normal and symmetric.  Psychiatric:        Behavior: Behavior normal.        Thought Content: Thought content normal.        Judgment: Judgment normal.       BP (!) 141/80   Pulse 61   Temp 98.2 F (36.8 C) (Temporal)   Ht 5\' 9"  (1.753 m)   Wt 188 lb (85.3 kg)   SpO2 95%   BMI 27.76 kg/m      Assessment & Plan:   Matthew [name] comes in today with chief complaint of Medical Management of Chronic Issues and  Allergies   Diagnosis and orders addressed:  1. Controlled substance agreement signed - HYDROcodone-acetaminophen (NORCO) 5-325 MG tablet; Take 1 tablet by mouth every 8 (eight) hours as needed for moderate pain.  Dispense: 90 tablet; Refill: 0 - HYDROcodone-acetaminophen (NORCO) 5-325 MG tablet; Take 1 tablet by mouth every 8 (eight) hours as needed for moderate pain.  Dispense: 90 tablet; Refill: 0 - HYDROcodone-acetaminophen (NORCO/VICODIN) 5-325 MG tablet; Take 1 tablet by mouth every 8 (eight) hours as needed for moderate pain.  Dispense: 90 tablet; Refill: 0 - CMP14+EGFR  2. Head and neck cancer (HCC) - HYDROcodone-acetaminophen (NORCO) 5-325 MG tablet; Take 1 tablet by mouth every 8 (eight) hours as needed for moderate pain.  Dispense: 90 tablet; Refill: 0 - HYDROcodone-acetaminophen (NORCO) 5-325 MG tablet; Take 1 tablet by mouth every 8 (eight) hours as needed for moderate pain.  Dispense: 90 tablet; Refill: 0 - HYDROcodone-acetaminophen (NORCO/VICODIN) 5-325 MG tablet; Take 1  tablet by mouth every 8 (eight) hours as needed for moderate pain.  Dispense: 90 tablet; Refill: 0 - CMP14+EGFR  3. Metastatic squamous neck cancer with occult primary (HCC) - HYDROcodone-acetaminophen (NORCO) 5-325 MG tablet; Take 1 tablet by mouth every 8 (eight) hours as needed for moderate pain.  Dispense: 90 tablet; Refill: 0 - HYDROcodone-acetaminophen (NORCO) 5-325 MG tablet; Take 1 tablet by mouth every 8 (eight) hours as needed for moderate pain.  Dispense: 90 tablet; Refill: 0 - HYDROcodone-acetaminophen (NORCO/VICODIN) 5-325 MG tablet; Take 1 tablet by mouth every 8 (eight) hours as needed for moderate pain.  Dispense: 90 tablet; Refill: 0 - CMP14+EGFR  4. Atrial fibrillation with RVR (HCC) - CMP14+EGFR  5. Adjustment disorder with mixed anxiety and depressed mood - CMP14+EGFR  6. Chronic midline back pain, unspecified back location - CMP14+EGFR  7. Other emphysema (HCC) - CMP14+EGFR  8.  Moderate episode of recurrent major depressive disorder (HCC) - CMP14+EGFR  9. H/O: CVA (cerebrovascular accident) - CMP14+EGFR  10. Primary hypertension  - CMP14+EGFR  11. Hyperlipidemia, unspecified hyperlipidemia type - CMP14+EGFR  12. Late effect of cerebrovascular accident (CVA) - CMP14+EGFR  13. Overweight (BMI 25.0-29.9) - CMP14+EGFR  14. PAF (paroxysmal atrial fibrillation) (HCC) - CMP14+EGFR  15. Type 2 diabetes mellitus with diabetic polyneuropathy, without long-term current use of insulin (HCC) - CMP14+EGFR - Bayer DCA Hb A1c Waived  16. Tobacco use - CMP14+EGFR   Labs pending Health Maintenance reviewed Diet and exercise encouraged  Follow up plan: 3 months    Jannifer Rodney, FNP

## 2022-06-13 NOTE — Patient Instructions (Signed)
Health Maintenance, Male Adopting a healthy lifestyle and getting preventive care are important in promoting health and wellness. Ask your health care provider about: The right schedule for you to have regular tests and exams. Things you can do on your own to prevent diseases and keep yourself healthy. What should I know about diet, weight, and exercise? Eat a healthy diet  Eat a diet that includes plenty of vegetables, fruits, low-fat dairy products, and lean protein. Do not eat a lot of foods that are high in solid fats, added sugars, or sodium. Maintain a healthy weight Body mass index (BMI) is a measurement that can be used to identify possible weight problems. It estimates body fat based on height and weight. Your health care provider can help determine your BMI and help you achieve or maintain a healthy weight. Get regular exercise Get regular exercise. This is one of the most important things you can do for your health. Most adults should: Exercise for at least 150 minutes each week. The exercise should increase your heart rate and make you sweat (moderate-intensity exercise). Do strengthening exercises at least twice a week. This is in addition to the moderate-intensity exercise. Spend less time sitting. Even light physical activity can be beneficial. Watch cholesterol and blood lipids Have your blood tested for lipids and cholesterol at 63 years of age, then have this test every 5 years. You may need to have your cholesterol levels checked more often if: Your lipid or cholesterol levels are high. You are older than 63 years of age. You are at high risk for heart disease. What should I know about cancer screening? Many types of cancers can be detected early and may often be prevented. Depending on your health history and family history, you may need to have cancer screening at various ages. This may include screening for: Colorectal cancer. Prostate cancer. Skin cancer. Lung  cancer. What should I know about heart disease, diabetes, and high blood pressure? Blood pressure and heart disease High blood pressure causes heart disease and increases the risk of stroke. This is more likely to develop in people who have high blood pressure readings or are overweight. Talk with your health care provider about your target blood pressure readings. Have your blood pressure checked: Every 3-5 years if you are 18-39 years of age. Every year if you are 40 years old or older. If you are between the ages of 65 and 75 and are a current or former smoker, ask your health care provider if you should have a one-time screening for abdominal aortic aneurysm (AAA). Diabetes Have regular diabetes screenings. This checks your fasting blood sugar level. Have the screening done: Once every three years after age 45 if you are at a normal weight and have a low risk for diabetes. More often and at a younger age if you are overweight or have a high risk for diabetes. What should I know about preventing infection? Hepatitis B If you have a higher risk for hepatitis B, you should be screened for this virus. Talk with your health care provider to find out if you are at risk for hepatitis B infection. Hepatitis C Blood testing is recommended for: Everyone born from 1945 through 1965. Anyone with known risk factors for hepatitis C. Sexually transmitted infections (STIs) You should be screened each year for STIs, including gonorrhea and chlamydia, if: You are sexually active and are younger than 63 years of age. You are older than 63 years of age and your   health care provider tells you that you are at risk for this type of infection. Your sexual activity has changed since you were last screened, and you are at increased risk for chlamydia or gonorrhea. Ask your health care provider if you are at risk. Ask your health care provider about whether you are at high risk for HIV. Your health care provider  may recommend a prescription medicine to help prevent HIV infection. If you choose to take medicine to prevent HIV, you should first get tested for HIV. You should then be tested every 3 months for as long as you are taking the medicine. Follow these instructions at home: Alcohol use Do not drink alcohol if your health care provider tells you not to drink. If you drink alcohol: Limit how much you have to 0-2 drinks a day. Know how much alcohol is in your drink. In the [place] one drink equals one 12 oz bottle of beer (355 mL), one 5 oz glass of wine (148 mL), or one 1 oz glass of hard liquor (44 mL). Lifestyle Do not use any products that contain nicotine or tobacco. These products include cigarettes, chewing tobacco, and vaping devices, such as e-cigarettes. If you need help quitting, ask your health care provider. Do not use street drugs. Do not share needles. Ask your health care provider for help if you need support or information about quitting drugs. General instructions Schedule regular health, dental, and eye exams. Stay current with your vaccines. Tell your health care provider if: You often feel depressed. You have ever been abused or do not feel safe at home. Summary Adopting a healthy lifestyle and getting preventive care are important in promoting health and wellness. Follow your health care provider's instructions about healthy diet, exercising, and getting tested or screened for diseases. Follow your health care provider's instructions on monitoring your cholesterol and blood pressure. This information is not intended to replace advice given to you by your health care provider. Make sure you discuss any questions you have with your health care provider. Document Revised: 06/21/2020 Document Reviewed: 06/21/2020 [employer] Patient Education  2023 [employer]  

## 2022-06-14 LAB — CMP14+EGFR
ALT: 14 IU/L (ref 0–44)
AST: 15 IU/L (ref 0–40)
Albumin/Globulin Ratio: 1.6 (ref 1.2–2.2)
Albumin: 4 g/dL (ref 3.9–4.9)
Alkaline Phosphatase: 87 IU/L (ref 44–121)
BUN/Creatinine Ratio: 19 (ref 10–24)
BUN: 11 mg/dL (ref 8–27)
Bilirubin Total: 0.2 mg/dL (ref 0.0–1.2)
CO2: 19 mmol/L — ABNORMAL LOW (ref 20–29)
Calcium: 9.1 mg/dL (ref 8.6–10.2)
Chloride: 102 mmol/L (ref 96–106)
Creatinine, Ser: 0.57 mg/dL — ABNORMAL LOW (ref 0.76–1.27)
Globulin, Total: 2.5 g/dL (ref 1.5–4.5)
Glucose: 100 mg/dL — ABNORMAL HIGH (ref 70–99)
Potassium: 4.2 mmol/L (ref 3.5–5.2)
Sodium: 139 mmol/L (ref 134–144)
Total Protein: 6.5 g/dL (ref 6.0–8.5)
eGFR: 111 mL/min/{1.73_m2} (ref >59)

## 2022-07-01 ENCOUNTER — Other Ambulatory Visit: Payer: Self-pay | Admitting: Family

## 2022-07-01 DIAGNOSIS — J441 Chronic obstructive pulmonary disease with (acute) exacerbation: Secondary | ICD-10-CM

## 2022-07-04 ENCOUNTER — Ambulatory Visit: Payer: Medicare HMO

## 2022-07-04 NOTE — Progress Notes (Unsigned)
Subjective:   Matthew [name] is a 62 y.o. male who presents for Medicare Annual/Subsequent preventive examination.  Review of Systems    ***       Objective:    There were no vitals filed for this visit. There is no height or weight on file to calculate BMI.     11/29/2021    3:02 PM 11/03/2021    8:44 AM 09/27/2021   10:11 AM 03/23/2021    9:16 AM 10/15/2020   11:06 AM 03/22/2020    8:49 AM 03/19/2019    8:39 AM  Advanced Directives  Does Patient Have a Medical Advance Directive? No No No No No No No  Would patient like information on creating a medical advance directive? No - Patient declined No - Patient declined No - Patient declined No - Patient declined  No - Patient declined No - Patient declined    Current Medications (verified) Outpatient Encounter Medications as of 07/04/2022  Medication Sig  . albuterol (VENTOLIN HFA) 108 (90 Base) MCG/ACT inhaler TAKE 2 PUFFS BY MOUTH EVERY 6 HOURS AS NEEDED FOR WHEEZE OR SHORTNESS OF BREATH  . amLODipine (NORVASC) 10 MG tablet TAKE 1 TABLET (10 MG TOTAL) BY MOUTH DAILY. (NEEDS TO BE SEEN BEFORE NEXT REFILL)  . apixaban (ELIQUIS) 5 MG TABS tablet TAKE 1 TABLET BY MOUTH TWICE A DAY **EMERGENCY FILL - PT LEFT IN **  . atorvastatin (LIPITOR) 40 MG tablet TAKE 1 TABLET BY MOUTH EVERY DAY  . baclofen (LIORESAL) 10 MG tablet TAKE 1 TABLET BY MOUTH THREE TIMES A DAY AS NEEDED FOR MUSCLE SPASMS  . blood glucose meter kit and supplies Dispense based on patient and insurance preference. Use up to four times daily as directed. (FOR ICD-10 E10.9, E11.9).  . carvedilol (COREG) 3.125 MG tablet TAKE 1 TABLET BY MOUTH TWICE A DAY WITH FOOD  . cetirizine (ZYRTEC ALLERGY) 10 MG tablet Take 1 tablet (10 mg total) by mouth daily.  . DULoxetine (CYMBALTA) 60 MG capsule Take 1 capsule (60 mg total) by mouth daily. (NEEDS TO BE SEEN BEFORE NEXT REFILL)  . fluticasone (FLONASE) 50 MCG/ACT nasal spray SPRAY 2 SPRAYS INTO EACH NOSTRIL EVERY DAY  .  Fluticasone-Umeclidin-Vilant (TRELEGY ELLIPTA) 100-62.5-25 MCG/ACT AEPB TAKE 1 PUFF BY MOUTH EVERY DAY  . gabapentin (NEURONTIN) 800 MG tablet TAKE 1 TABLET IN THE MORNING, AT NOON, IN THE EVENING, AND AT BEDTIME. (NEEDS APPT FOR REFILLS)  . glucose blood test strip Test BS daily Dx E11.65  . HYDROcodone-acetaminophen (NORCO) 5-325 MG tablet Take 1 tablet by mouth every 8 (eight) hours as needed for moderate pain.  Marland Kitchen HYDROcodone-acetaminophen (NORCO) 5-325 MG tablet Take 1 tablet by mouth every 8 (eight) hours as needed for moderate pain.  Melene Muller ON 08/10/2022] HYDROcodone-acetaminophen (NORCO/VICODIN) 5-325 MG tablet Take 1 tablet by mouth every 8 (eight) hours as needed for moderate pain.  Marland Kitchen lidocaine (XYLOCAINE) 2 % solution   . lisinopril (ZESTRIL) 5 MG tablet TAKE 1 TABLET (5 MG TOTAL) BY MOUTH DAILY.  Marland Kitchen loratadine (CLARITIN) 10 MG tablet TAKE 1 TABLET BY MOUTH EVERY DAY  . magic mouthwash (nystatin, lidocaine, diphenhydrAMINE, alum & mag hydroxide) suspension Swish and spit 10 mLs 3 (three) times daily.  . metFORMIN (GLUCOPHAGE) 500 MG tablet TAKE 1 TABLET BY MOUTH EVERY DAY WITH BREAKFAST  . OXcarbazepine (TRILEPTAL) 150 MG tablet TAKE 1 TABLET BY MOUTH TWICE A DAY  . polyethylene glycol powder (GLYCOLAX/MIRALAX) 17 GM/SCOOP powder Stir and dissolve 17g of powder in  any 4 to 8 ounces of beverage (cold, hot or room temperature), then drink. Take 1-2 times daily to prevent constipation.   No facility-administered encounter medications on file as of 07/04/2022.    Allergies (verified) Penicillins   History: Past Medical History:  Diagnosis Date  . Abscess   . Anxiety   . Aphthous ulcer   . Arthritis   . Asthma   . Chronic bronchitis   . Chronic pain   . COPD (chronic obstructive pulmonary disease) (HCC)   . CVA (cerebral vascular accident) (HCC)   . Depression   . Hyperlipidemia   . Hypertension   . Trigeminal neuralgia   . Vertigo    Past Surgical History:  Procedure  Laterality Date  . CHOLECYSTECTOMY    . HAND RECONSTRUCTION Right 1990's  . TRIGEMINAL NERVE DECOMPRESSION Right    Family History  Problem Relation Age of Onset  . Hypertension Mother   . Diabetes Mother   . Hypertension Father   . Diabetes Father   . Cancer Father   . Emphysema Father   . Early death Brother    Social History   Socioeconomic History  . Marital status: Legally Separated    Spouse name: Not on file  . Number of children: 3  . Years of education: 9  . Highest education level: 9th grade  Occupational History  . Occupation: Disabled  Tobacco Use  . Smoking status: Every Day    Packs/day: 1.50    Years: 40.00    Additional pack years: 0.00    Total pack years: 60.00    Types: Cigarettes    Start date: 07/23/1975  . Smokeless tobacco: Never  Vaping Use  . Vaping Use: Never used  Substance and Sexual Activity  . Alcohol use: Not Currently    Comment: 08/21/2012 "quit drinking ~ 1985"  . Drug use: No  . Sexual activity: Not Currently  Other Topics Concern  . Not on file  Social History Narrative   Highest level of education: 9th grade   Right handed   Drinks caffeine   [place] camper [place] camp[place]      Social Determinants of Health   Financial Resource Strain: Low Risk  (03/23/2021)   Overall Financial Resource Strain (CARDIA)   . Difficulty of Paying Living Expenses: Not very hard  Food Insecurity: Food Insecurity Present (03/23/2021)   Hunger Vital Sign   . Worried About Programme researcher, broadcasting/film/video in the Last Year: Sometimes true   . Ran Out of Food in the Last Year: Never true  Transportation Needs: Unmet Transportation Needs (10/04/2021)   PRAPARE - Transportation   . Lack of Transportation (Medical): Yes   . Lack of Transportation (Non-Medical): No  Physical Activity: Insufficiently Active (03/23/2021)   Exercise Vital Sign   . Days of Exercise per Week: 7 days   . Minutes of Exercise per Session: 20 min  Stress: No Stress Concern Present  (03/23/2021)   [employer] of Occupational Health - Occupational Stress Questionnaire   . Feeling of Stress : Only a little  Recent Concern: Stress - Stress Concern Present (03/07/2021)   [employer] of Occupational Health - Occupational Stress Questionnaire   . Feeling of Stress : To some extent  Social Connections: Socially Isolated (03/23/2021)   Social Connection and Isolation Panel [NHANES]   . Frequency of Communication with Friends and Family: More than three times a week   . Frequency of Social Gatherings with Friends and Family:  More than three times a week   . Attends Religious Services: Never   . Active Member of Clubs or Organizations: No   . Attends Banker Meetings: Never   . Marital Status: Separated    Tobacco Counseling Ready to quit: Not Answered Counseling given: Not Answered   Clinical Intake:                 Diabetic?***         Activities of Daily Living     No data to display           Patient Care Team: Junie Spencer, FNP as PCP - General (Nurse Practitioner) Jonelle Sidle, MD as PCP - Cardiology (Cardiology) Randa Spike Kelton Pillar, LCSW as [facility] Care Management (Licensed Clinical Social Worker) Malmfelt, Lise Auer, RN as Oncology Nurse Navigator Lonie Peak, MD as Consulting Physician (Radiation Oncology) Serena Colonel, MD as Consulting Physician (Otolaryngology)  Indicate any recent Medical Services you may have received from other than [facility] providers in the past year (date may be approximate).     Assessment:   This is a routine wellness examination for [place].  Hearing/Vision screen No results found.  Dietary issues and exercise activities discussed:     Goals Addressed   None   Depression Screen    06/13/2022   11:37 AM 04/12/2022    9:33 AM 03/09/2022   11:12 AM 12/05/2021   11:11 AM 12/05/2021   10:53 AM 11/30/2021   11:29 AM 09/05/2021   10:47 AM  PHQ 2/9 Scores   PHQ - 2 Score 0  0 0   0  PHQ- 9 Score 0  0 0     Exception Documentation  Patient refusal   Patient refusal Patient refusal     Fall Risk    04/12/2022    9:33 AM 03/09/2022   11:12 AM 12/05/2021   10:53 AM 09/05/2021   10:47 AM 08/30/2021   10:44 AM  Fall Risk   Falls in the past year? 0 0 0 0 0  Number falls in past yr:    0   Injury with Fall?    0   Follow up    Falls evaluation completed     FALL RISK PREVENTION PERTAINING TO THE HOME:  Any stairs in or around the home? {YES/NO:21197} If so, are there any without handrails? {YES/NO:21197} Home free of loose throw rugs in walkways, pet beds, electrical cords, etc? {YES/NO:21197} Adequate lighting in your home to reduce risk of falls? {YES/NO:21197}  ASSISTIVE DEVICES UTILIZED TO PREVENT FALLS:  Life alert? {YES/NO:21197} Use of a cane, walker or w/c? {YES/NO:21197} Grab bars in the bathroom? {YES/NO:21197} Shower chair or bench in shower? {YES/NO:21197} Elevated toilet seat or a handicapped toilet? {YES/NO:21197}  TIMED UP AND GO:  Was the test performed? {YES/NO:21197}.  Length of time to ambulate 10 feet: *** sec.   {Appearance of ZOXW:9604540}  Cognitive Function:        03/23/2021    9:18 AM 03/22/2020    8:52 AM 03/19/2019    8:44 AM  6CIT Screen  What Year? 0 points 0 points 0 points  What month? 3 points 3 points 3 points  What time? 0 points 0 points 3 points  Count back from 20 0 points 4 points 0 points  Months in reverse 2 points 4 points 4 points  Repeat phrase 8 points 10 points 10 points  Total Score 13 points 21 points 20 points  Immunizations Immunization History  Administered Date(s) Administered  . Influenza,inj,Quad PF,6+ Mos 01/13/2014, 11/24/2014, 12/02/2015, 10/23/2017, 10/25/2018, 11/18/2019, 12/16/2020, 12/26/2021  . Pneumococcal Polysaccharide-23 08/22/2012  . Tdap 12/05/2015  . Zoster Recombinat (Shingrix) 09/28/2020, 06/13/2022    {TDAP status:2101805}  {Flu Vaccine  status:2101806}  {Pneumococcal vaccine status:2101807}  {Covid-19 vaccine status:2101808}  Qualifies for Shingles Vaccine? {YES/NO:21197}  Zostavax completed {YES/NO:21197}  {Shingrix Completed?:2101804}  Screening Tests Health Maintenance  Topic Date Due  . COVID-19 Vaccine (1) Never done  . OPHTHALMOLOGY EXAM  Never done  . Lung Cancer Screening  01/17/2017  . Fecal DNA (Cologuard)  03/10/2023 (Originally 07/22/2004)  . INFLUENZA VACCINE  09/14/2022  . HEMOGLOBIN A1C  12/13/2022  . Diabetic kidney evaluation - Urine ACR  12/27/2022  . Diabetic kidney evaluation - eGFR measurement  06/13/2023  . FOOT EXAM  06/13/2023  . Medicare Annual Wellness (AWV)  07/04/2023  . DTaP/Tdap/Td (2 - Td or Tdap) 12/04/2025  . Hepatitis C Screening  Completed  . HIV Screening  Completed  . Zoster Vaccines- Shingrix  Completed  . HPV VACCINES  Aged Out    Health Maintenance  Health Maintenance Due  Topic Date Due  . COVID-19 Vaccine (1) Never done  . OPHTHALMOLOGY EXAM  Never done  . Lung Cancer Screening  01/17/2017    {Colorectal cancer screening:2101809}  Lung Cancer Screening: (Low Dose CT Chest recommended if Age 46-80 years, 30 pack-year currently smoking OR have quit w/in 15years.) {DOES NOT does:27190::"does not"} qualify.   Lung Cancer Screening Referral: ***  Additional Screening:  Hepatitis C Screening: {DOES NOT does:27190::"does not"} qualify; Completed ***  Vision Screening: Recommended annual ophthalmology exams for early detection of glaucoma and other disorders of the eye. Is the patient up to date with their annual eye exam?  {YES/NO:21197} Who is the provider or what is the name of the office in which the patient attends annual eye exams? *** If pt is not established with a provider, would they like to be referred to a provider to establish care? {YES/NO:21197}.   Dental Screening: Recommended annual dental exams for proper oral hygiene  Community Resource  Referral / Chronic Care Management: CRR required this visit?  {YES/NO:21197}  CCM required this visit?  {YES/NO:21197}     Plan:     I have personally reviewed and noted the following in the patient's chart:   Medical and social history Use of alcohol, tobacco or illicit drugs  Current medications and supplements including opioid prescriptions. {Opioid [phone]} Functional ability and status Nutritional status Physical activity Advanced directives List of other physicians Hospitalizations, surgeries, and ER visits in previous 12 months Vitals Screenings to include cognitive, depression, and falls Referrals and appointments  In addition, I have reviewed and discussed with patient certain preventive protocols, quality metrics, and best practice recommendations. A written personalized care plan for preventive services as well as general preventive health recommendations were provided to patient.     Dorene Sorrow, LPN   1/61/0960   Nurse Notes: ***

## 2022-07-04 NOTE — Patient Instructions (Signed)
Fall Prevention in the Home, Adult Falls can cause injuries and can happen to people of all ages. There are many things you can do to make your home safer and to help prevent falls. What actions can I take to prevent falls? General information Use good lighting in all rooms. Make sure to: Replace any light bulbs that burn out. Turn on the lights in dark areas and use night-lights. Keep items that you use often in easy-to-reach places. Lower the shelves around your home if needed. Move furniture so that there are clear paths around it. Do not use throw rugs or other things on the floor that can make you trip. If any of your floors are uneven, fix them. Add color or contrast paint or tape to clearly mark and help you see: Grab bars or handrails. First and last steps of staircases. Where the edge of each step is. If you use a ladder or stepladder: Make sure that it is fully opened. Do not climb a closed ladder. Make sure the sides of the ladder are locked in place. Have someone hold the ladder while you use it. Know where your pets are as you move through your home. What can I do in the bathroom?     Keep the floor dry. Clean up any water on the floor right away. Remove soap buildup in the bathtub or shower. Buildup makes bathtubs and showers slippery. Use non-skid mats or decals on the floor of the bathtub or shower. Attach bath mats securely with double-sided, non-slip rug tape. If you need to sit down in the shower, use a non-slip stool. Install grab bars by the toilet and in the bathtub and shower. Do not use towel bars as grab bars. What can I do in the bedroom? Make sure that you have a light by your bed that is easy to reach. Do not use any sheets or blankets on your bed that hang to the floor. Have a firm chair or bench with side arms that you can use for support when you get dressed. What can I do in the kitchen? Clean up any spills right away. If you need to reach something  above you, use a step stool with a grab bar. Keep electrical cords out of the way. Do not use floor polish or wax that makes floors slippery. What can I do with my stairs? Do not leave anything on the stairs. Make sure that you have a light switch at the top and the bottom of the stairs. Make sure that there are handrails on both sides of the stairs. Fix handrails that are broken or loose. Install non-slip stair treads on all your stairs if they do not have carpet. Avoid having throw rugs at the top or bottom of the stairs. Choose a carpet that does not hide the edge of the steps on the stairs. Make sure that the carpet is firmly attached to the stairs. Fix carpet that is loose or worn. What can I do on the outside of my home? Use bright outdoor lighting. Fix the edges of walkways and driveways and fix any cracks. Clear paths of anything that can make you trip, such as tools or rocks. Add color or contrast paint or tape to clearly mark and help you see anything that might make you trip as you walk through a door, such as a raised step or threshold. Trim any bushes or trees on paths to your home. Check to see if handrails are loose   or broken and that both sides of all steps have handrails. Install guardrails along the edges of any raised decks and porches. Have leaves, snow, or ice cleared regularly. Use sand, salt, or ice melter on paths if you live where there is ice and snow during the winter. Clean up any spills in your garage right away. This includes grease or oil spills. What other actions can I take? Review your medicines with your doctor. Some medicines can cause dizziness or changes in blood pressure, which increase your risk of falling. Wear shoes that: Have a low heel. Do not wear high heels. Have rubber bottoms and are closed at the toe. Feel good on your feet and fit well. Use tools that help you move around if needed. These include: Canes. Walkers. Scooters. Crutches. Ask  your doctor what else you can do to help prevent falls. This may include seeing a physical therapist to learn to do exercises to move better and get stronger. Where to find more information Centers for Disease Control and Prevention, [employer]: cdc.gov National Institute on Aging: nia.nih.gov National Institute on Aging: nia.nih.gov Contact a doctor if: You are afraid of falling at home. You feel weak, drowsy, or dizzy at home. You fall at home. Get help right away if you: Lose consciousness or have trouble moving after a fall. Have a fall that causes a head injury. These symptoms may be an emergency. Get help right away. Call 911. Do not wait to see if the symptoms will go away. Do not drive yourself to the hospital. This information is not intended to replace advice given to you by your health care provider. Make sure you discuss any questions you have with your health care provider. Document Revised: 10/03/2021 Document Reviewed: 10/03/2021 Elsevier Patient Education  2023 Elsevier Inc.  

## 2022-07-09 ENCOUNTER — Other Ambulatory Visit: Payer: Self-pay | Admitting: Family

## 2022-07-09 ENCOUNTER — Other Ambulatory Visit: Payer: Self-pay | Admitting: Neurology

## 2022-07-09 DIAGNOSIS — I10 Essential (primary) hypertension: Secondary | ICD-10-CM

## 2022-07-13 ENCOUNTER — Other Ambulatory Visit: Payer: Self-pay | Admitting: Neurology

## 2022-07-18 NOTE — Therapy (Signed)
[place] [address] [place], [place], [address] Phone: [phone]   Fax:  [phone]  Patient Details  Name: Matthew [name] MRN: [id number] Date of Birth: 1960/03/06 Referring Provider:  Lonie Peak, MD Encounter Date: 07/18/2022  SPEECH THERAPY DISCHARGE SUMMARY  Visits from Start of Care: 2  Current functional level related to goals / functional outcomes: Pt did not attend scheduled follow ups and will be formally discharged at this time. Goals from his last attended appointment are below: SHORT TERM GOALS: Target date:  2 therapy visits (visit #3)       pt will complete HEP with rare min A  Baseline: Goal status: Ongoing   2.  pt will tell SLP why pt is completing HEP with modified independence Baseline:  Goal status: Ongoing   3.  pt will describe 3 overt s/s aspiration PNA with modified independence Baseline:  Goal status: Ongoing     LONG TERM GOALS: Target date:  6 therapy visits (visit #7)      pt will complete HEP with modified independnence in 2 sessions Baseline:  Goal status: Ongoing   2.  pt will describe how to modify HEP over time, and the timeline associated with reduction in HEP frequency with modified independence over two sessions Baseline:  Goal status: Ongoing       Remaining deficits: Unknown, as this is an unexpected d/c from ST.    Education / Equipment: See initial evaluation for details.    Patient agrees to discharge. Patient goals were not met. Patient is being discharged due to not returning since the last visit.Marland Kitchen   [name], CCC-SLP 07/18/2022, 1:34 PM  [place] [address] [place], [place], [address] Phone: [phone]   Fax:  [phone]

## 2022-07-21 ENCOUNTER — Other Ambulatory Visit: Payer: Self-pay

## 2022-07-21 DIAGNOSIS — G500 Trigeminal neuralgia: Secondary | ICD-10-CM

## 2022-07-21 MED ORDER — BACLOFEN 10 MG PO TABS
ORAL_TABLET | ORAL | 0 refills | Status: AC
Start: 2022-07-21 — End: ?

## 2022-07-21 MED ORDER — OXCARBAZEPINE 150 MG PO TABS: 150.0000 mg | ORAL_TABLET | Freq: Two times a day (BID) | ORAL | 5 refills | Status: DC

## 2022-07-21 NOTE — Telephone Encounter (Signed)
Prescription sent to pharmacy.

## 2022-07-21 NOTE — Addendum Note (Signed)
Addended by: Jannifer Rodney A on: 07/21/2022 04:09 PM   Modules accepted: Orders

## 2022-07-21 NOTE — Telephone Encounter (Signed)
Last OV 05/3022. Next OV 09/11/2022 Baclofen last filled 10/31/21 #270 no refills  Patient wants to know if you can take over refills on oxcarbazepine that was written by Dr. Nita Sickle on 10/31/2021 as he states that he has been to that office in a while.

## 2022-08-27 ENCOUNTER — Other Ambulatory Visit: Payer: Self-pay | Admitting: Family

## 2022-08-27 DIAGNOSIS — G500 Trigeminal neuralgia: Secondary | ICD-10-CM

## 2022-09-04 ENCOUNTER — Other Ambulatory Visit: Payer: Self-pay | Admitting: Family

## 2022-09-04 DIAGNOSIS — I4891 Unspecified atrial fibrillation: Secondary | ICD-10-CM

## 2022-09-11 ENCOUNTER — Ambulatory Visit: Payer: Medicare HMO | Admitting: Family

## 2022-09-11 ENCOUNTER — Encounter: Payer: Self-pay | Admitting: Family

## 2022-09-26 ENCOUNTER — Other Ambulatory Visit: Payer: Self-pay | Admitting: Family

## 2022-09-26 ENCOUNTER — Telehealth: Payer: Self-pay | Admitting: Family

## 2022-09-26 ENCOUNTER — Ambulatory Visit (INDEPENDENT_AMBULATORY_CARE_PROVIDER_SITE_OTHER): Payer: Medicare HMO | Admitting: Family

## 2022-09-26 ENCOUNTER — Encounter: Payer: Self-pay | Admitting: Family

## 2022-09-26 VITALS — BP 146/76 | HR 54 | Temp 97.9°F | Ht 69.0 in | Wt 198.6 lb

## 2022-09-26 DIAGNOSIS — G500 Trigeminal neuralgia: Secondary | ICD-10-CM

## 2022-09-26 DIAGNOSIS — F331 Major depressive disorder, recurrent, moderate: Secondary | ICD-10-CM

## 2022-09-26 DIAGNOSIS — Z79899 Other long term (current) drug therapy: Secondary | ICD-10-CM

## 2022-09-26 DIAGNOSIS — C760 Malignant neoplasm of head, face and neck: Secondary | ICD-10-CM

## 2022-09-26 DIAGNOSIS — Z0001 Encounter for general adult medical examination with abnormal findings: Secondary | ICD-10-CM | POA: Diagnosis not present

## 2022-09-26 DIAGNOSIS — C7989 Secondary malignant neoplasm of other specified sites: Secondary | ICD-10-CM | POA: Diagnosis not present

## 2022-09-26 DIAGNOSIS — G8929 Other chronic pain: Secondary | ICD-10-CM

## 2022-09-26 DIAGNOSIS — I6930 Unspecified sequelae of cerebral infarction: Secondary | ICD-10-CM

## 2022-09-26 DIAGNOSIS — Z0000 Encounter for general adult medical examination without abnormal findings: Secondary | ICD-10-CM | POA: Diagnosis not present

## 2022-09-26 DIAGNOSIS — E1142 Type 2 diabetes mellitus with diabetic polyneuropathy: Secondary | ICD-10-CM | POA: Diagnosis not present

## 2022-09-26 DIAGNOSIS — M549 Dorsalgia, unspecified: Secondary | ICD-10-CM | POA: Diagnosis not present

## 2022-09-26 DIAGNOSIS — E785 Hyperlipidemia, unspecified: Secondary | ICD-10-CM | POA: Diagnosis not present

## 2022-09-26 DIAGNOSIS — I10 Essential (primary) hypertension: Secondary | ICD-10-CM

## 2022-09-26 DIAGNOSIS — J438 Other emphysema: Secondary | ICD-10-CM

## 2022-09-26 DIAGNOSIS — C801 Malignant (primary) neoplasm, unspecified: Secondary | ICD-10-CM | POA: Diagnosis not present

## 2022-09-26 DIAGNOSIS — I4891 Unspecified atrial fibrillation: Secondary | ICD-10-CM

## 2022-09-26 LAB — BAYER DCA HB A1C WAIVED: HB A1C (BAYER DCA - WAIVED): 5.2 % (ref 4.8–5.6)

## 2022-09-26 MED ORDER — HYDROCODONE-ACETAMINOPHEN 7.5-325 MG PO TABS
1.0000 | ORAL_TABLET | Freq: Three times a day (TID) | ORAL | 0 refills | Status: DC | PRN
Start: 2022-10-24 — End: 2022-12-28

## 2022-09-26 MED ORDER — HYDROCODONE-ACETAMINOPHEN 7.5-325 MG PO TABS: 1.0000 | ORAL_TABLET | Freq: Three times a day (TID) | ORAL | 0 refills | Status: DC | PRN

## 2022-09-26 MED ORDER — HYDROCODONE-ACETAMINOPHEN 7.5-325 MG PO TABS
1.0000 | ORAL_TABLET | Freq: Three times a day (TID) | ORAL | 0 refills | Status: DC | PRN
Start: 2022-11-23 — End: 2022-09-26

## 2022-09-26 NOTE — Progress Notes (Signed)
Subjective:    Patient ID: Matthew [name], male    DOB: April 22, 1960, 62 y.o.   MRN: [id number]  Chief Complaint  Patient presents with   Medical Management of Chronic Issues   PT presents to the office today for CPE and chronic follow up and pain management. He has hx of facial trauma and mandible fracture on 04/05/21. He is also being treated for metastatic squamous neck cancer. He completed radiation. He is followed by Oncologists. He had PET scan on 03/10/22 that showed, "1. Interval resolution of previously demonstrated hypermetabolic right cervical adenopathy consistent with response to therapy. 2. No evidence of residual hypermetabolic disease in the neck, chest, abdomen or pelvis. 3. Clustered nodularity in the left lower lobe and lingula without associated hypermetabolic activity, likely postinflammatory."   Report soreness and burning of 6 out 10.   He has COPD and continues to smoke a 1 pack a day. Has intermittent SOB that is worse with pollen. Uses Trelegy.   He is a diabetic, HTN, COPD, and hx of CVA. He takes Eliquis daily.    Has trigeminal neuralgia and was followed by Neurologists, but has not seen them recently.    Has thoracic aortic arteriosclerosis and CAD and takes Lipitor daily. Hypertension This is a chronic problem. The current episode started more than 1 year ago. The problem has been waxing and waning since onset. The problem is uncontrolled. Associated symptoms include anxiety, malaise/fatigue, peripheral edema and shortness of breath. Pertinent negatives include no blurred vision. Risk factors for coronary artery disease include dyslipidemia, diabetes mellitus, smoking/tobacco exposure, sedentary lifestyle and male gender. The current treatment provides moderate improvement.  Diabetes He presents for his follow-up diabetic visit. He has type 2 diabetes mellitus. Hypoglycemia symptoms include nervousness/anxiousness. Associated symptoms include fatigue. Pertinent  negatives for diabetes include no blurred vision and no foot paresthesias. Symptoms are stable. Diabetic complications include peripheral neuropathy. Risk factors for coronary artery disease include hypertension, dyslipidemia, diabetes mellitus and sedentary lifestyle. He is following a generally unhealthy diet. (Does not check at home)  Back Pain This is a chronic problem. The current episode started more than 1 year ago. The problem occurs intermittently. The problem has been waxing and waning since onset. The pain is present in the lumbar spine. The quality of the pain is described as aching. The pain is at a severity of 3/10. The pain is moderate.  Nicotine Dependence Presents for follow-up visit. Symptoms include fatigue. His urge triggers include company of smokers. The symptoms have been stable. He smokes 1 pack of cigarettes per day.  Hyperlipidemia This is a chronic problem. The current episode started more than 1 year ago. Associated symptoms include shortness of breath. Current antihyperlipidemic treatment includes statins. The current treatment provides moderate improvement of lipids. Risk factors for coronary artery disease include dyslipidemia, diabetes mellitus, hypertension, male sex and a sedentary lifestyle.  Depression        This is a chronic problem.  The current episode started more than 1 year ago.   Associated symptoms include fatigue, helplessness, hopelessness and sad.  Past medical history includes anxiety.   Anxiety Presents for follow-up visit. Symptoms include excessive worry, nervous/anxious behavior and shortness of breath. Symptoms occur occasionally. The severity of symptoms is moderate.      Current opioids rx- Norco 5-325 mg # meds rx- 90 Effectiveness of current meds-Not strong enough, requesting we increase  Adverse reactions from pain meds-none Morphine equivalent- 22.5  Pill count performed-No Last drug screen -  12/27/22 ( high risk q44m, moderate risk  q19m, low risk yearly ) Urine drug screen today- Yes Was the [facility] reviewed- yes  If yes were their any concerning findings? - no red flags  Pain contract signed on: 12/26/21     Review of Systems  Constitutional:  Positive for fatigue and malaise/fatigue.  Eyes:  Negative for blurred vision.  Respiratory:  Positive for shortness of breath.   Musculoskeletal:  Positive for back pain.  Psychiatric/Behavioral:  Positive for depression. The patient is nervous/anxious.   All other systems reviewed and are negative.   Family History  Problem Relation Age of Onset   Hypertension Mother    Diabetes Mother    Hypertension Father    Diabetes Father    Cancer Father    Emphysema Father    Early death Brother    Social History   Socioeconomic History   Marital status: Legally Separated    Spouse name: Not on file   Number of children: 3   Years of education: 9   Highest education level: 9th grade  Occupational History   Occupation: Disabled  Tobacco Use   Smoking status: Every Day    Current packs/day: 1.50    Average packs/day: 1.5 packs/day for 47.2 years (70.8 ttl pk-yrs)    Types: Cigarettes    Start date: 07/23/1975   Smokeless tobacco: Never  Vaping Use   Vaping status: Never Used  Substance and Sexual Activity   Alcohol use: Not Currently    Comment: 08/21/2012 "quit drinking ~ 1985"   Drug use: No   Sexual activity: Not Currently  Other Topics Concern   Not on file  Social History Narrative   Highest level of education: 9th grade   Right handed   Drinks caffeine   [place]      Social Determinants of Health   Financial Resource Strain: Low Risk  (03/23/2021)   Overall Financial Resource Strain (CARDIA)    Difficulty of Paying Living Expenses: Not very hard  Food Insecurity: Food Insecurity Present (03/23/2021)   Hunger Vital Sign    Worried About Running Out of Food in the Last Year: Sometimes true    Ran Out of Food in the Last Year:  Never true  Transportation Needs: Unmet Transportation Needs (10/04/2021)   PRAPARE - Administrator, Civil Service (Medical): Yes    Lack of Transportation (Non-Medical): No  Physical Activity: Insufficiently Active (03/23/2021)   Exercise Vital Sign    Days of Exercise per Week: 7 days    Minutes of Exercise per Session: 20 min  Stress: No Stress Concern Present (03/23/2021)   [employer] of Occupational Health - Occupational Stress Questionnaire    Feeling of Stress : Only a little  Recent Concern: Stress - Stress Concern Present (03/07/2021)   [employer] of Occupational Health - Occupational Stress Questionnaire    Feeling of Stress : To some extent  Social Connections: Socially Isolated (03/23/2021)   Social Connection and Isolation Panel [NHANES]    Frequency of Communication with Friends and Family: More than three times a week    Frequency of Social Gatherings with Friends and Family: More than three times a week    Attends Religious Services: Never    Database administrator or Organizations: No    Attends Banker Meetings: Never    Marital Status: Separated       Objective:   Physical Exam Vitals reviewed.  Constitutional:      General: He is not in acute distress.    Appearance: He is well-developed. He is obese.  HENT:     Head: Normocephalic.     Right Ear: Tympanic membrane normal.     Left Ear: Tympanic membrane normal.     Mouth/Throat:     Dentition: Abnormal dentition.     Comments: Pain in bilateral jaw with opening  Eyes:     General:        Right eye: No discharge.        Left eye: No discharge.     Pupils: Pupils are equal, round, and reactive to light.  Neck:     Thyroid: No thyromegaly.  Cardiovascular:     Rate and Rhythm: Normal rate and regular rhythm.     Heart sounds: Normal heart sounds. No murmur heard. Pulmonary:     Effort: Pulmonary effort is normal. No respiratory distress.     Breath sounds: Normal  breath sounds. No wheezing.  Abdominal:     General: Bowel sounds are normal. There is no distension.     Palpations: Abdomen is soft.     Tenderness: There is no abdominal tenderness.  Musculoskeletal:        General: No tenderness. Normal range of motion.     Cervical back: Normal range of motion and neck supple.  Skin:    General: Skin is warm and dry.     Findings: No erythema or rash.  Neurological:     Mental Status: He is alert and oriented to person, place, and time.     Cranial Nerves: No cranial nerve deficit.     Deep Tendon Reflexes: Reflexes are normal and symmetric.  Psychiatric:        Behavior: Behavior normal.        Thought Content: Thought content normal.        Judgment: Judgment normal.       BP (!) 146/76   Pulse (!) 54   Temp 97.9 F (36.6 C) (Temporal)   Ht 5\' 9"  (1.753 m)   Wt 198 lb 9.6 oz (90.1 kg)   SpO2 95%   BMI 29.33 kg/m      Assessment & Plan:   Matthew [name] comes in today with chief complaint of Medical Management of Chronic Issues   Diagnosis and orders addressed:  1. Controlled substance agreement signed - HYDROcodone-acetaminophen (NORCO) 7.5-325 MG tablet; Take 1 tablet by mouth every 8 (eight) hours as needed for moderate pain.  Dispense: 90 tablet; Refill: 0 - HYDROcodone-acetaminophen (NORCO) 7.5-325 MG tablet; Take 1 tablet by mouth every 8 (eight) hours as needed for moderate pain.  Dispense: 90 tablet; Refill: 0 - HYDROcodone-acetaminophen (NORCO) 7.5-325 MG tablet; Take 1 tablet by mouth every 8 (eight) hours as needed for moderate pain.  Dispense: 90 tablet; Refill: 0 - CBC with Differential/Platelet - CMP14+EGFR - Drug Screen 10 W/Conf, Serum  2. Head and neck cancer (HCC) - HYDROcodone-acetaminophen (NORCO) 7.5-325 MG tablet; Take 1 tablet by mouth every 8 (eight) hours as needed for moderate pain.  Dispense: 90 tablet; Refill: 0 - HYDROcodone-acetaminophen (NORCO) 7.5-325 MG tablet; Take 1 tablet by mouth every 8  (eight) hours as needed for moderate pain.  Dispense: 90 tablet; Refill: 0 - HYDROcodone-acetaminophen (NORCO) 7.5-325 MG tablet; Take 1 tablet by mouth every 8 (eight) hours as needed for moderate pain.  Dispense: 90 tablet; Refill: 0 - CBC with Differential/Platelet - CMP14+EGFR - Drug Screen 10  W/Conf, Serum  3. Metastatic squamous neck cancer with occult primary (HCC) - HYDROcodone-acetaminophen (NORCO) 7.5-325 MG tablet; Take 1 tablet by mouth every 8 (eight) hours as needed for moderate pain.  Dispense: 90 tablet; Refill: 0 - HYDROcodone-acetaminophen (NORCO) 7.5-325 MG tablet; Take 1 tablet by mouth every 8 (eight) hours as needed for moderate pain.  Dispense: 90 tablet; Refill: 0 - HYDROcodone-acetaminophen (NORCO) 7.5-325 MG tablet; Take 1 tablet by mouth every 8 (eight) hours as needed for moderate pain.  Dispense: 90 tablet; Refill: 0 - CBC with Differential/Platelet - CMP14+EGFR - Drug Screen 10 W/Conf, Serum  4. Atrial fibrillation with RVR (HCC) - CBC with Differential/Platelet - CMP14+EGFR  5. Chronic midline back pain, unspecified back location - CBC with Differential/Platelet - CMP14+EGFR  6. Other emphysema (HCC) - CBC with Differential/Platelet - CMP14+EGFR  7. Moderate episode of recurrent major depressive disorder (HCC) - CBC with Differential/Platelet - CMP14+EGFR  8. Primary hypertension - CBC with Differential/Platelet - CMP14+EGFR  9. Hyperlipidemia, unspecified hyperlipidemia type - CBC with Differential/Platelet - CMP14+EGFR - Lipid panel  10. Late effect of cerebrovascular accident (CVA) - CBC with Differential/Platelet - CMP14+EGFR  11. Type 2 diabetes mellitus with diabetic polyneuropathy, without long-term current use of insulin (HCC) - Bayer DCA Hb A1c Waived - CBC with Differential/Platelet - CMP14+EGFR  12. Trigeminal neuralgia - CBC with Differential/Platelet - CMP14+EGFR  13. Annual physical exam - TSH - VITAMIN D 25 Hydroxy  (Vit-D Deficiency, Fractures)   Labs pending Patient reviewed in [place] controlled database, no flags noted. Contract and drug screen are up to date.  Continue current medications  Health Maintenance reviewed Diet and exercise encouraged  Follow up plan: 3 months    Jannifer Rodney, FNP

## 2022-09-26 NOTE — Patient Instructions (Signed)
Hypertension, Adult High blood pressure (hypertension) is when the force of blood pumping through the arteries is too strong. The arteries are the blood vessels that carry blood from the heart throughout the body. Hypertension forces the heart to work harder to pump blood and may cause arteries to become narrow or stiff. Untreated or uncontrolled hypertension can lead to a heart attack, heart failure, a stroke, kidney disease, and other problems. A blood pressure reading consists of a higher number over a lower number. Ideally, your blood pressure should be below 120/80. The first ("top") number is called the systolic pressure. It is a measure of the pressure in your arteries as your heart beats. The second ("bottom") number is called the diastolic pressure. It is a measure of the pressure in your arteries as the heart relaxes. What are the causes? The exact cause of this condition is not known. There are some conditions that result in high blood pressure. What increases the risk? Certain factors may make you more likely to develop high blood pressure. Some of these risk factors are under your control, including: Smoking. Not getting enough exercise or physical activity. Being overweight. Having too much fat, sugar, calories, or salt (sodium) in your diet. Drinking too much alcohol. Other risk factors include: Having a personal history of heart disease, diabetes, high cholesterol, or kidney disease. Stress. Having a family history of high blood pressure and high cholesterol. Having obstructive sleep apnea. Age. The risk increases with age. What are the signs or symptoms? High blood pressure may not cause symptoms. Very high blood pressure (hypertensive crisis) may cause: Headache. Fast or irregular heartbeats (palpitations). Shortness of breath. Nosebleed. Nausea and vomiting. Vision changes. Severe chest pain, dizziness, and seizures. How is this diagnosed? This condition is diagnosed by  measuring your blood pressure while you are seated, with your arm resting on a flat surface, your legs uncrossed, and your feet flat on the floor. The cuff of the blood pressure monitor will be placed directly against the skin of your upper arm at the level of your heart. Blood pressure should be measured at least twice using the same arm. Certain conditions can cause a difference in blood pressure between your right and left arms. If you have a high blood pressure reading during one visit or you have normal blood pressure with other risk factors, you may be asked to: Return on a different day to have your blood pressure checked again. Monitor your blood pressure at home for 1 week or longer. If you are diagnosed with hypertension, you may have other blood or imaging tests to help your health care provider understand your overall risk for other conditions. How is this treated? This condition is treated by making healthy lifestyle changes, such as eating healthy foods, exercising more, and reducing your alcohol intake. You may be referred for counseling on a healthy diet and physical activity. Your health care provider may prescribe medicine if lifestyle changes are not enough to get your blood pressure under control and if: Your systolic blood pressure is above 130. Your diastolic blood pressure is above 80. Your personal target blood pressure may vary depending on your medical conditions, your age, and other factors. Follow these instructions at home: Eating and drinking  Eat a diet that is high in fiber and potassium, and low in sodium, added sugar, and fat. An example of this eating plan is called the DASH diet. DASH stands for Dietary Approaches to Stop Hypertension. To eat this way: Eat   plenty of fresh fruits and vegetables. Try to fill one half of your plate at each meal with fruits and vegetables. Eat whole grains, such as whole-wheat pasta, brown rice, or whole-grain bread. Fill about one  fourth of your plate with whole grains. Eat or drink low-fat dairy products, such as skim milk or low-fat yogurt. Avoid fatty cuts of meat, processed or cured meats, and poultry with skin. Fill about one fourth of your plate with lean proteins, such as fish, chicken without skin, beans, eggs, or tofu. Avoid pre-made and processed foods. These tend to be higher in sodium, added sugar, and fat. Reduce your daily sodium intake. Many people with hypertension should eat less than 1,500 mg of sodium a day. Do not drink alcohol if: Your health care provider tells you not to drink. You are pregnant, may be pregnant, or are planning to become pregnant. If you drink alcohol: Limit how much you have to: 0-1 drink a day for women. 0-2 drinks a day for men. Know how much alcohol is in your drink. In the [place] one drink equals one 12 oz bottle of beer (355 mL), one 5 oz glass of wine (148 mL), or one 1 oz glass of hard liquor (44 mL). Lifestyle  Work with your health care provider to maintain a healthy body weight or to lose weight. Ask what an ideal weight is for you. Get at least 30 minutes of exercise that causes your heart to beat faster (aerobic exercise) most days of the week. Activities may include walking, swimming, or biking. Include exercise to strengthen your muscles (resistance exercise), such as Pilates or lifting weights, as part of your weekly exercise routine. Try to do these types of exercises for 30 minutes at least 3 days a week. Do not use any products that contain nicotine or tobacco. These products include cigarettes, chewing tobacco, and vaping devices, such as e-cigarettes. If you need help quitting, ask your health care provider. Monitor your blood pressure at home as told by your health care provider. Keep all follow-up visits. This is important. Medicines Take over-the-counter and prescription medicines only as told by your health care provider. Follow directions carefully. Blood  pressure medicines must be taken as prescribed. Do not skip doses of blood pressure medicine. Doing this puts you at risk for problems and can make the medicine less effective. Ask your health care provider about side effects or reactions to medicines that you should watch for. Contact a health care provider if you: Think you are having a reaction to a medicine you are taking. Have headaches that keep coming back (recurring). Feel dizzy. Have swelling in your ankles. Have trouble with your vision. Get help right away if you: Develop a severe headache or confusion. Have unusual weakness or numbness. Feel faint. Have severe pain in your chest or abdomen. Vomit repeatedly. Have trouble breathing. These symptoms may be an emergency. Get help right away. Call 911. Do not wait to see if the symptoms will go away. Do not drive yourself to the hospital. Summary Hypertension is when the force of blood pumping through your arteries is too strong. If this condition is not controlled, it may put you at risk for serious complications. Your personal target blood pressure may vary depending on your medical conditions, your age, and other factors. For most people, a normal blood pressure is less than 120/80. Hypertension is treated with lifestyle changes, medicines, or a combination of both. Lifestyle changes include losing weight, eating a healthy,   low-sodium diet, exercising more, and limiting alcohol. This information is not intended to replace advice given to you by your health care provider. Make sure you discuss any questions you have with your health care provider. Document Revised: 12/07/2020 Document Reviewed: 12/07/2020 Elsevier Patient Education  2024 [employer]  

## 2022-09-26 NOTE — Telephone Encounter (Signed)
Hyrdrocodone that was sent in for pt today has 2 start dates of 9/10. One of them needs to be changed to start in August.

## 2022-09-26 NOTE — Telephone Encounter (Signed)
Rx resent to [facility].   Jannifer Rodney, FNP

## 2022-09-28 ENCOUNTER — Other Ambulatory Visit: Payer: Self-pay | Admitting: Family

## 2022-09-30 LAB — CMP14+EGFR
ALT: 13 IU/L (ref 0–44)
AST: 13 IU/L (ref 0–40)
Albumin: 4.3 g/dL (ref 3.9–4.9)
Alkaline Phosphatase: 93 IU/L (ref 44–121)
BUN/Creatinine Ratio: 15 (ref 10–24)
BUN: 9 mg/dL (ref 8–27)
Bilirubin Total: 0.3 mg/dL (ref 0.0–1.2)
CO2: 23 mmol/L (ref 20–29)
Calcium: 9.3 mg/dL (ref 8.6–10.2)
Chloride: 103 mmol/L (ref 96–106)
Creatinine, Ser: 0.61 mg/dL — ABNORMAL LOW (ref 0.76–1.27)
Globulin, Total: 2.4 g/dL (ref 1.5–4.5)
Glucose: 91 mg/dL (ref 70–99)
Potassium: 4.1 mmol/L (ref 3.5–5.2)
Sodium: 139 mmol/L (ref 134–144)
Total Protein: 6.7 g/dL (ref 6.0–8.5)
eGFR: 108 mL/min/1.73

## 2022-09-30 LAB — CBC WITH DIFFERENTIAL/PLATELET
Basophils Absolute: 0 x10E3/uL (ref 0.0–0.2)
Basos: 1 %
EOS (ABSOLUTE): 0.1 x10E3/uL (ref 0.0–0.4)
Eos: 2 %
Hematocrit: 42.3 % (ref 37.5–51.0)
Hemoglobin: 13.9 g/dL (ref 13.0–17.7)
Immature Grans (Abs): 0 x10E3/uL (ref 0.0–0.1)
Immature Granulocytes: 0 %
Lymphocytes Absolute: 0.9 x10E3/uL (ref 0.7–3.1)
Lymphs: 14 %
MCH: 29.3 pg (ref 26.6–33.0)
MCHC: 32.9 g/dL (ref 31.5–35.7)
MCV: 89 fL (ref 79–97)
Monocytes Absolute: 0.5 x10E3/uL (ref 0.1–0.9)
Monocytes: 8 %
Neutrophils Absolute: 4.7 x10E3/uL (ref 1.4–7.0)
Neutrophils: 75 %
Platelets: 228 x10E3/uL (ref 150–450)
RBC: 4.74 x10E6/uL (ref 4.14–5.80)
RDW: 13 % (ref 11.6–15.4)
WBC: 6.3 x10E3/uL (ref 3.4–10.8)

## 2022-09-30 LAB — VITAMIN D 25 HYDROXY (VIT D DEFICIENCY, FRACTURES): Vit D, 25-Hydroxy: 20.6 ng/mL — ABNORMAL LOW (ref 30.0–100.0)

## 2022-10-03 ENCOUNTER — Other Ambulatory Visit: Payer: Self-pay | Admitting: Family

## 2022-10-03 DIAGNOSIS — E785 Hyperlipidemia, unspecified: Secondary | ICD-10-CM

## 2022-10-12 ENCOUNTER — Other Ambulatory Visit: Payer: Self-pay | Admitting: Family

## 2022-10-12 DIAGNOSIS — I10 Essential (primary) hypertension: Secondary | ICD-10-CM

## 2022-10-29 ENCOUNTER — Other Ambulatory Visit: Payer: Self-pay | Admitting: Family

## 2022-10-29 DIAGNOSIS — G500 Trigeminal neuralgia: Secondary | ICD-10-CM

## 2022-11-15 ENCOUNTER — Other Ambulatory Visit: Payer: Self-pay | Admitting: Family

## 2022-11-15 DIAGNOSIS — J301 Allergic rhinitis due to pollen: Secondary | ICD-10-CM

## 2022-11-15 DIAGNOSIS — G500 Trigeminal neuralgia: Secondary | ICD-10-CM

## 2022-11-15 DIAGNOSIS — J441 Chronic obstructive pulmonary disease with (acute) exacerbation: Secondary | ICD-10-CM

## 2022-12-08 ENCOUNTER — Other Ambulatory Visit: Payer: Self-pay | Admitting: Family

## 2022-12-08 DIAGNOSIS — J438 Other emphysema: Secondary | ICD-10-CM

## 2022-12-08 DIAGNOSIS — Z720 Tobacco use: Secondary | ICD-10-CM

## 2022-12-24 ENCOUNTER — Other Ambulatory Visit: Payer: Self-pay | Admitting: Family

## 2022-12-24 DIAGNOSIS — G500 Trigeminal neuralgia: Secondary | ICD-10-CM

## 2022-12-28 ENCOUNTER — Ambulatory Visit (INDEPENDENT_AMBULATORY_CARE_PROVIDER_SITE_OTHER): Payer: Medicare HMO | Admitting: Family

## 2022-12-28 ENCOUNTER — Encounter: Payer: Self-pay | Admitting: Family

## 2022-12-28 VITALS — BP 118/78 | HR 59 | Temp 97.7°F | Wt 212.0 lb

## 2022-12-28 DIAGNOSIS — G8929 Other chronic pain: Secondary | ICD-10-CM

## 2022-12-28 DIAGNOSIS — L089 Local infection of the skin and subcutaneous tissue, unspecified: Secondary | ICD-10-CM

## 2022-12-28 DIAGNOSIS — C801 Malignant (primary) neoplasm, unspecified: Secondary | ICD-10-CM | POA: Diagnosis not present

## 2022-12-28 DIAGNOSIS — Z1211 Encounter for screening for malignant neoplasm of colon: Secondary | ICD-10-CM

## 2022-12-28 DIAGNOSIS — I10 Essential (primary) hypertension: Secondary | ICD-10-CM | POA: Diagnosis not present

## 2022-12-28 DIAGNOSIS — I6930 Unspecified sequelae of cerebral infarction: Secondary | ICD-10-CM

## 2022-12-28 DIAGNOSIS — C7989 Secondary malignant neoplasm of other specified sites: Secondary | ICD-10-CM | POA: Diagnosis not present

## 2022-12-28 DIAGNOSIS — Z23 Encounter for immunization: Secondary | ICD-10-CM | POA: Diagnosis not present

## 2022-12-28 DIAGNOSIS — F331 Major depressive disorder, recurrent, moderate: Secondary | ICD-10-CM

## 2022-12-28 DIAGNOSIS — E669 Obesity, unspecified: Secondary | ICD-10-CM

## 2022-12-28 DIAGNOSIS — C760 Malignant neoplasm of head, face and neck: Secondary | ICD-10-CM | POA: Diagnosis not present

## 2022-12-28 DIAGNOSIS — E785 Hyperlipidemia, unspecified: Secondary | ICD-10-CM

## 2022-12-28 DIAGNOSIS — E1142 Type 2 diabetes mellitus with diabetic polyneuropathy: Secondary | ICD-10-CM | POA: Diagnosis not present

## 2022-12-28 DIAGNOSIS — G500 Trigeminal neuralgia: Secondary | ICD-10-CM | POA: Diagnosis not present

## 2022-12-28 DIAGNOSIS — Z720 Tobacco use: Secondary | ICD-10-CM

## 2022-12-28 DIAGNOSIS — Z122 Encounter for screening for malignant neoplasm of respiratory organs: Secondary | ICD-10-CM

## 2022-12-28 DIAGNOSIS — Z79899 Other long term (current) drug therapy: Secondary | ICD-10-CM | POA: Diagnosis not present

## 2022-12-28 DIAGNOSIS — J438 Other emphysema: Secondary | ICD-10-CM

## 2022-12-28 LAB — BAYER DCA HB A1C WAIVED: HB A1C (BAYER DCA - WAIVED): 5.9 % — ABNORMAL HIGH (ref 4.8–5.6)

## 2022-12-28 MED ORDER — CEPHALEXIN 500 MG PO CAPS: 500.0000 mg | ORAL_CAPSULE | Freq: Three times a day (TID) | ORAL | 0 refills | Status: DC

## 2022-12-28 MED ORDER — DULOXETINE HCL 60 MG PO CPEP
60.0000 mg | ORAL_CAPSULE | Freq: Every day | ORAL | 3 refills | Status: DC
Start: 2022-12-28 — End: 2023-04-17

## 2022-12-28 MED ORDER — LISINOPRIL 5 MG PO TABS: 5.0000 mg | ORAL_TABLET | Freq: Every day | ORAL | 1 refills | Status: DC

## 2022-12-28 MED ORDER — HYDROCODONE-ACETAMINOPHEN 7.5-325 MG PO TABS
1.0000 | ORAL_TABLET | Freq: Three times a day (TID) | ORAL | 0 refills | Status: DC | PRN
Start: 2023-02-23 — End: 2023-04-17

## 2022-12-28 MED ORDER — HYDROCODONE-ACETAMINOPHEN 7.5-325 MG PO TABS
1.0000 | ORAL_TABLET | Freq: Three times a day (TID) | ORAL | 0 refills | Status: DC | PRN
Start: 2023-01-24 — End: 2023-04-17

## 2022-12-28 MED ORDER — AMLODIPINE BESYLATE 10 MG PO TABS
10.0000 mg | ORAL_TABLET | Freq: Every day | ORAL | 3 refills | Status: DC
Start: 2022-12-28 — End: 2023-04-17

## 2022-12-28 MED ORDER — HYDROCODONE-ACETAMINOPHEN 7.5-325 MG PO TABS
1.0000 | ORAL_TABLET | Freq: Three times a day (TID) | ORAL | 0 refills | Status: DC | PRN
Start: 2022-12-28 — End: 2023-04-17

## 2022-12-28 NOTE — Patient Instructions (Signed)
Health Maintenance, Male Adopting a healthy lifestyle and getting preventive care are important in promoting health and wellness. Ask your health care provider about: The right schedule for you to have regular tests and exams. Things you can do on your own to prevent diseases and keep yourself healthy. What should I know about diet, weight, and exercise? Eat a healthy diet  Eat a diet that includes plenty of vegetables, fruits, low-fat dairy products, and lean protein. Do not eat a lot of foods that are high in solid fats, added sugars, or sodium. Maintain a healthy weight Body mass index (BMI) is a measurement that can be used to identify possible weight problems. It estimates body fat based on height and weight. Your health care provider can help determine your BMI and help you achieve or maintain a healthy weight. Get regular exercise Get regular exercise. This is one of the most important things you can do for your health. Most adults should: Exercise for at least 150 minutes each week. The exercise should increase your heart rate and make you sweat (moderate-intensity exercise). Do strengthening exercises at least twice a week. This is in addition to the moderate-intensity exercise. Spend less time sitting. Even light physical activity can be beneficial. Watch cholesterol and blood lipids Have your blood tested for lipids and cholesterol at 63 years of age, then have this test every 5 years. You may need to have your cholesterol levels checked more often if: Your lipid or cholesterol levels are high. You are older than 63 years of age. You are at high risk for heart disease. What should I know about cancer screening? Many types of cancers can be detected early and may often be prevented. Depending on your health history and family history, you may need to have cancer screening at various ages. This may include screening for: Colorectal cancer. Prostate cancer. Skin cancer. Lung  cancer. What should I know about heart disease, diabetes, and high blood pressure? Blood pressure and heart disease High blood pressure causes heart disease and increases the risk of stroke. This is more likely to develop in people who have high blood pressure readings or are overweight. Talk with your health care provider about your target blood pressure readings. Have your blood pressure checked: Every 3-5 years if you are 18-39 years of age. Every year if you are 40 years old or older. If you are between the ages of 65 and 75 and are a current or former smoker, ask your health care provider if you should have a one-time screening for abdominal aortic aneurysm (AAA). Diabetes Have regular diabetes screenings. This checks your fasting blood sugar level. Have the screening done: Once every three years after age 45 if you are at a normal weight and have a low risk for diabetes. More often and at a younger age if you are overweight or have a high risk for diabetes. What should I know about preventing infection? Hepatitis B If you have a higher risk for hepatitis B, you should be screened for this virus. Talk with your health care provider to find out if you are at risk for hepatitis B infection. Hepatitis C Blood testing is recommended for: Everyone born from 1945 through 1965. Anyone with known risk factors for hepatitis C. Sexually transmitted infections (STIs) You should be screened each year for STIs, including gonorrhea and chlamydia, if: You are sexually active and are younger than 63 years of age. You are older than 63 years of age and your   health care provider tells you that you are at risk for this type of infection. Your sexual activity has changed since you were last screened, and you are at increased risk for chlamydia or gonorrhea. Ask your health care provider if you are at risk. Ask your health care provider about whether you are at high risk for HIV. Your health care provider  may recommend a prescription medicine to help prevent HIV infection. If you choose to take medicine to prevent HIV, you should first get tested for HIV. You should then be tested every 3 months for as long as you are taking the medicine. Follow these instructions at home: Alcohol use Do not drink alcohol if your health care provider tells you not to drink. If you drink alcohol: Limit how much you have to 0-2 drinks a day. Know how much alcohol is in your drink. In the [place] one drink equals one 12 oz bottle of beer (355 mL), one 5 oz glass of wine (148 mL), or one 1 oz glass of hard liquor (44 mL). Lifestyle Do not use any products that contain nicotine or tobacco. These products include cigarettes, chewing tobacco, and vaping devices, such as e-cigarettes. If you need help quitting, ask your health care provider. Do not use street drugs. Do not share needles. Ask your health care provider for help if you need support or information about quitting drugs. General instructions Schedule regular health, dental, and eye exams. Stay current with your vaccines. Tell your health care provider if: You often feel depressed. You have ever been abused or do not feel safe at home. Summary Adopting a healthy lifestyle and getting preventive care are important in promoting health and wellness. Follow your health care provider's instructions about healthy diet, exercising, and getting tested or screened for diseases. Follow your health care provider's instructions on monitoring your cholesterol and blood pressure. This information is not intended to replace advice given to you by your health care provider. Make sure you discuss any questions you have with your health care provider. Document Revised: 06/21/2020 Document Reviewed: 06/21/2020 Elsevier Patient Education  2024 [employer]  

## 2022-12-28 NOTE — Progress Notes (Signed)
Subjective:    Patient ID: Matthew [name], male    DOB: May 22, 1960, 62 y.o.   MRN: [id number]  Chief Complaint  Patient presents with   Medical Management of Chronic Issues   PT presents to the office today for chronic follow up and pain management. He has hx of facial trauma and mandible fracture on 04/05/21. He is also being treated for metastatic squamous neck cancer. He completed radiation. He is followed by Oncologists.   He had PET scan on 03/10/22 that showed, "1. Interval resolution of previously demonstrated hypermetabolic right cervical adenopathy consistent with response to therapy. 2. No evidence of residual hypermetabolic disease in the neck, chest, abdomen or pelvis. 3. Clustered nodularity in the left lower lobe and lingula without associated hypermetabolic activity, likely postinflammatory."   Report soreness and burning of 5 out 10.   He has COPD and continues to smoke a 1 pack a day. Has intermittent SOB that is worse with pollen. Uses Trelegy.   He is a diabetic, HTN, COPD, and hx of CVA. He takes Eliquis daily.    Has trigeminal neuralgia and was followed by Neurologists, but has not seen them recently.    Has thoracic aortic arteriosclerosis and CAD and takes Lipitor daily.  Hypertension This is a chronic problem. The current episode started more than 1 year ago. The problem has been resolved since onset. The problem is controlled. Pertinent negatives include no peripheral edema or shortness of breath. Risk factors for coronary artery disease include dyslipidemia, diabetes mellitus, obesity, male gender, sedentary lifestyle and smoking/tobacco exposure. The current treatment provides moderate improvement.  Diabetes He presents for his follow-up diabetic visit. He has type 2 diabetes mellitus. Symptoms are stable. Risk factors for coronary artery disease include dyslipidemia, diabetes mellitus, hypertension, sedentary lifestyle, post-menopausal and male sex. He is  following a generally unhealthy diet. (Does not check glucose at home)  Hyperlipidemia This is a chronic problem. The current episode started more than 1 year ago. Exacerbating diseases include obesity. Pertinent negatives include no shortness of breath. Current antihyperlipidemic treatment includes statins. The current treatment provides moderate improvement of lipids. Risk factors for coronary artery disease include dyslipidemia, diabetes mellitus, hypertension, male sex and a sedentary lifestyle.  Depression        This is a chronic problem.  The current episode started more than 1 year ago.   The problem occurs intermittently.  Associated symptoms include sad.  Associated symptoms include no helplessness and no hopelessness.  Past treatments include nothing. Back Pain This is a chronic problem. The current episode started more than 1 year ago. The problem occurs intermittently. The problem has been waxing and waning since onset. The pain is present in the lumbar spine. The quality of the pain is described as aching. The pain is at a severity of 3/10. The pain is moderate. Risk factors include obesity. He has tried analgesics and muscle relaxant for the symptoms. The treatment provided mild relief.      Review of Systems  Respiratory:  Negative for shortness of breath.   All other systems reviewed and are negative.      Objective:   Physical Exam Vitals reviewed.  Constitutional:      General: He is not in acute distress.    Appearance: He is well-developed. He is obese.  HENT:     Head: Normocephalic.     Right Ear: Tympanic membrane normal.     Left Ear: Tympanic membrane normal.  Eyes:  General:        Right eye: No discharge.        Left eye: No discharge.     Pupils: Pupils are equal, round, and reactive to light.  Neck:     Thyroid: No thyromegaly.  Cardiovascular:     Rate and Rhythm: Normal rate and regular rhythm.     Heart sounds: Normal heart sounds. No murmur  heard. Pulmonary:     Effort: Pulmonary effort is normal. No respiratory distress.     Breath sounds: Normal breath sounds. No wheezing.  Abdominal:     General: Bowel sounds are normal. There is no distension.     Palpations: Abdomen is soft.     Tenderness: There is no abdominal tenderness.  Musculoskeletal:        General: No tenderness. Normal range of motion.     Cervical back: Normal range of motion and neck supple.  Skin:    General: Skin is warm and dry.     Findings: Rash present. No erythema.     Comments: Multiple erythemas scabs on bilateral arms   Neurological:     Mental Status: He is alert and oriented to person, place, and time.     Cranial Nerves: No cranial nerve deficit.     Deep Tendon Reflexes: Reflexes are normal and symmetric.  Psychiatric:        Behavior: Behavior normal.        Thought Content: Thought content normal.        Judgment: Judgment normal.       BP 118/78   Pulse (!) 59   Temp 97.7 F (36.5 C) (Temporal)   Wt 212 lb (96.2 kg)   SpO2 94%   BMI 31.31 kg/m      Assessment & Plan:   Matthew [name] comes in today with chief complaint of Medical Management of Chronic Issues   Diagnosis and orders addressed:  1. Trigeminal neuralgia - DULoxetine (CYMBALTA) 60 MG capsule; Take 1 capsule (60 mg total) by mouth daily. (NEEDS TO BE SEEN BEFORE NEXT REFILL)  Dispense: 90 capsule; Refill: 3 - CBC with Differential/Platelet - CMP14+EGFR - ToxASSURE Select 13 (MW), Urine  2. Moderate episode of recurrent major depressive disorder (HCC) - DULoxetine (CYMBALTA) 60 MG capsule; Take 1 capsule (60 mg total) by mouth daily. (NEEDS TO BE SEEN BEFORE NEXT REFILL)  Dispense: 90 capsule; Refill: 3 - CBC with Differential/Platelet - CMP14+EGFR  3. Controlled substance agreement signed - HYDROcodone-acetaminophen (NORCO) 7.5-325 MG tablet; Take 1 tablet by mouth every 8 (eight) hours as needed for moderate pain (pain score 4-6).  Dispense: 90 tablet;  Refill: 0 - HYDROcodone-acetaminophen (NORCO) 7.5-325 MG tablet; Take 1 tablet by mouth every 8 (eight) hours as needed for moderate pain (pain score 4-6).  Dispense: 90 tablet; Refill: 0 - HYDROcodone-acetaminophen (NORCO) 7.5-325 MG tablet; Take 1 tablet by mouth every 8 (eight) hours as needed for moderate pain (pain score 4-6).  Dispense: 90 tablet; Refill: 0 - CBC with Differential/Platelet - CMP14+EGFR - ToxASSURE Select 13 (MW), Urine  4. Head and neck cancer (HCC) - HYDROcodone-acetaminophen (NORCO) 7.5-325 MG tablet; Take 1 tablet by mouth every 8 (eight) hours as needed for moderate pain (pain score 4-6).  Dispense: 90 tablet; Refill: 0 - HYDROcodone-acetaminophen (NORCO) 7.5-325 MG tablet; Take 1 tablet by mouth every 8 (eight) hours as needed for moderate pain (pain score 4-6).  Dispense: 90 tablet; Refill: 0 - HYDROcodone-acetaminophen (NORCO) 7.5-325 MG tablet; Take 1 tablet  by mouth every 8 (eight) hours as needed for moderate pain (pain score 4-6).  Dispense: 90 tablet; Refill: 0 - CBC with Differential/Platelet - CMP14+EGFR - ToxASSURE Select 13 (MW), Urine  5. Metastatic squamous neck cancer with occult primary (HCC) - HYDROcodone-acetaminophen (NORCO) 7.5-325 MG tablet; Take 1 tablet by mouth every 8 (eight) hours as needed for moderate pain (pain score 4-6).  Dispense: 90 tablet; Refill: 0 - HYDROcodone-acetaminophen (NORCO) 7.5-325 MG tablet; Take 1 tablet by mouth every 8 (eight) hours as needed for moderate pain (pain score 4-6).  Dispense: 90 tablet; Refill: 0 - HYDROcodone-acetaminophen (NORCO) 7.5-325 MG tablet; Take 1 tablet by mouth every 8 (eight) hours as needed for moderate pain (pain score 4-6).  Dispense: 90 tablet; Refill: 0 - CBC with Differential/Platelet - CMP14+EGFR - ToxASSURE Select 13 (MW), Urine  6. Essential hypertension - amLODipine (NORVASC) 10 MG tablet; Take 1 tablet (10 mg total) by mouth daily. (NEEDS TO BE SEEN BEFORE NEXT REFILL)  Dispense:  90 tablet; Refill: 3 - lisinopril (ZESTRIL) 5 MG tablet; Take 1 tablet (5 mg total) by mouth daily.  Dispense: 90 tablet; Refill: 1 - CBC with Differential/Platelet - CMP14+EGFR  7. Encounter for immunization - Flu vaccine trivalent PF, 6mos and older(Flulaval,Afluria,Fluarix,Fluzone) - CBC with Differential/Platelet - CMP14+EGFR  8. Primary hypertension - CBC with Differential/Platelet - CMP14+EGFR  9. Other emphysema (HCC) - CBC with Differential/Platelet - CMP14+EGFR  10. Hyperlipidemia, unspecified hyperlipidemia type - CBC with Differential/Platelet - CMP14+EGFR  11. Obesity (BMI 30-39.9) - CBC with Differential/Platelet - CMP14+EGFR  12. Chronic midline back pain, unspecified back location - CBC with Differential/Platelet - CMP14+EGFR  13. Late effect of cerebrovascular accident (CVA)  - CBC with Differential/Platelet - CMP14+EGFR  14. Tobacco use  - CBC with Differential/Platelet - CMP14+EGFR  15. Type 2 diabetes mellitus with diabetic polyneuropathy, without long-term current use of insulin (HCC) - Bayer DCA Hb A1c Waived - CBC with Differential/Platelet - CMP14+EGFR - Microalbumin / creatinine urine ratio  16. Skin infection - cephALEXin (KEFLEX) 500 MG capsule; Take 1 capsule (500 mg total) by mouth 3 (three) times daily.  Dispense: 21 capsule; Refill: 0 - CBC with Differential/Platelet - CMP14+EGFR  17. Encounter for screening for lung cancer - Ambulatory Referral Lung Cancer Screening  Pulmonary  18. Colon cancer screening - Cologuard   Labs pending Patient reviewed in [place] controlled database, no flags noted. Contract and drug screen up dated today.  Encouraged patient to call Oncologists for follow up  Health Maintenance reviewed Diet and exercise encouraged  Follow up plan: 3 months    Jannifer Rodney, FNP

## 2022-12-29 LAB — CBC WITH DIFFERENTIAL/PLATELET
Basophils Absolute: 0 10*3/uL (ref 0.0–0.2)
Basos: 1 %
EOS (ABSOLUTE): 0.3 10*3/uL (ref 0.0–0.4)
Eos: 4 %
Hematocrit: 41.5 % (ref 37.5–51.0)
Hemoglobin: 13.6 g/dL (ref 13.0–17.7)
Immature Grans (Abs): 0 10*3/uL (ref 0.0–0.1)
Immature Granulocytes: 0 %
Lymphocytes Absolute: 1 10*3/uL (ref 0.7–3.1)
Lymphs: 13 %
MCH: 30 pg (ref 26.6–33.0)
MCHC: 32.8 g/dL (ref 31.5–35.7)
MCV: 91 fL (ref 79–97)
Monocytes Absolute: 0.8 10*3/uL (ref 0.1–0.9)
Monocytes: 9 %
Neutrophils Absolute: 6.1 10*3/uL (ref 1.4–7.0)
Neutrophils: 73 %
Platelets: 206 10*3/uL (ref 150–450)
RBC: 4.54 x10E6/uL (ref 4.14–5.80)
RDW: 12.9 % (ref 11.6–15.4)
WBC: 8.3 10*3/uL (ref 3.4–10.8)

## 2022-12-29 LAB — CMP14+EGFR
ALT: 14 [IU]/L (ref 0–44)
AST: 12 [IU]/L (ref 0–40)
Albumin: 4.2 g/dL (ref 3.9–4.9)
Alkaline Phosphatase: 104 [IU]/L (ref 44–121)
BUN/Creatinine Ratio: 18 (ref 10–24)
BUN: 12 mg/dL (ref 8–27)
Bilirubin Total: 0.3 mg/dL (ref 0.0–1.2)
CO2: 21 mmol/L (ref 20–29)
Calcium: 8.8 mg/dL (ref 8.6–10.2)
Chloride: 104 mmol/L (ref 96–106)
Creatinine, Ser: 0.66 mg/dL — ABNORMAL LOW (ref 0.76–1.27)
Globulin, Total: 2.5 g/dL (ref 1.5–4.5)
Glucose: 93 mg/dL (ref 70–99)
Potassium: 4.2 mmol/L (ref 3.5–5.2)
Sodium: 141 mmol/L (ref 134–144)
Total Protein: 6.7 g/dL (ref 6.0–8.5)
eGFR: 105 mL/min/{1.73_m2} (ref >59)

## 2022-12-29 LAB — MICROALBUMIN / CREATININE URINE RATIO
Creatinine, Urine: 63 mg/dL
Microalb/Creat Ratio: 32 mg/g{creat} — ABNORMAL HIGH (ref 0–29)
Microalbumin, Urine: 19.9 ug/mL

## 2023-01-02 LAB — TOXASSURE SELECT 13 (MW), URINE

## 2023-01-05 NOTE — Progress Notes (Unsigned)
Patient no showed for the appointment. It has been rescheduled.

## 2023-01-16 ENCOUNTER — Other Ambulatory Visit: Payer: Self-pay | Admitting: Family

## 2023-01-16 DIAGNOSIS — I10 Essential (primary) hypertension: Secondary | ICD-10-CM

## 2023-01-16 LAB — COLOGUARD

## 2023-01-21 ENCOUNTER — Other Ambulatory Visit: Payer: Self-pay | Admitting: Family

## 2023-01-21 DIAGNOSIS — G500 Trigeminal neuralgia: Secondary | ICD-10-CM

## 2023-02-01 ENCOUNTER — Other Ambulatory Visit: Payer: Self-pay | Admitting: Family

## 2023-02-17 DIAGNOSIS — E119 Type 2 diabetes mellitus without complications: Secondary | ICD-10-CM | POA: Diagnosis not present

## 2023-02-17 DIAGNOSIS — I10 Essential (primary) hypertension: Secondary | ICD-10-CM | POA: Diagnosis not present

## 2023-02-17 DIAGNOSIS — Z720 Tobacco use: Secondary | ICD-10-CM | POA: Diagnosis not present

## 2023-02-17 DIAGNOSIS — E785 Hyperlipidemia, unspecified: Secondary | ICD-10-CM | POA: Diagnosis not present

## 2023-02-17 DIAGNOSIS — H6092 Unspecified otitis externa, left ear: Secondary | ICD-10-CM | POA: Diagnosis not present

## 2023-02-17 DIAGNOSIS — J449 Chronic obstructive pulmonary disease, unspecified: Secondary | ICD-10-CM | POA: Diagnosis not present

## 2023-02-17 DIAGNOSIS — H9192 Unspecified hearing loss, left ear: Secondary | ICD-10-CM | POA: Diagnosis not present

## 2023-02-17 DIAGNOSIS — I4891 Unspecified atrial fibrillation: Secondary | ICD-10-CM | POA: Diagnosis not present

## 2023-02-17 DIAGNOSIS — Z8589 Personal history of malignant neoplasm of other organs and systems: Secondary | ICD-10-CM | POA: Diagnosis not present

## 2023-03-07 ENCOUNTER — Other Ambulatory Visit: Payer: Self-pay | Admitting: Family

## 2023-03-07 DIAGNOSIS — J441 Chronic obstructive pulmonary disease with (acute) exacerbation: Secondary | ICD-10-CM

## 2023-03-10 ENCOUNTER — Other Ambulatory Visit: Payer: Self-pay | Admitting: Family

## 2023-03-10 DIAGNOSIS — G500 Trigeminal neuralgia: Secondary | ICD-10-CM

## 2023-03-24 DIAGNOSIS — J42 Unspecified chronic bronchitis: Secondary | ICD-10-CM | POA: Diagnosis not present

## 2023-03-24 DIAGNOSIS — E785 Hyperlipidemia, unspecified: Secondary | ICD-10-CM | POA: Diagnosis not present

## 2023-03-24 DIAGNOSIS — Z720 Tobacco use: Secondary | ICD-10-CM | POA: Diagnosis not present

## 2023-03-24 DIAGNOSIS — I10 Essential (primary) hypertension: Secondary | ICD-10-CM | POA: Diagnosis not present

## 2023-03-24 DIAGNOSIS — E119 Type 2 diabetes mellitus without complications: Secondary | ICD-10-CM | POA: Diagnosis not present

## 2023-03-24 DIAGNOSIS — R0602 Shortness of breath: Secondary | ICD-10-CM | POA: Diagnosis not present

## 2023-03-24 DIAGNOSIS — Z1152 Encounter for screening for COVID-19: Secondary | ICD-10-CM | POA: Diagnosis not present

## 2023-03-24 DIAGNOSIS — Z8589 Personal history of malignant neoplasm of other organs and systems: Secondary | ICD-10-CM | POA: Diagnosis not present

## 2023-03-30 ENCOUNTER — Other Ambulatory Visit: Payer: Self-pay | Admitting: Family

## 2023-03-30 ENCOUNTER — Ambulatory Visit: Payer: Medicare HMO | Admitting: Family

## 2023-03-30 ENCOUNTER — Encounter: Payer: Self-pay | Admitting: Family

## 2023-03-30 DIAGNOSIS — E785 Hyperlipidemia, unspecified: Secondary | ICD-10-CM

## 2023-03-30 NOTE — Telephone Encounter (Signed)
Letter sent.

## 2023-03-30 NOTE — Telephone Encounter (Signed)
Christy NTBS in May for 6 mos FU RF sent to pharmacy

## 2023-04-01 ENCOUNTER — Other Ambulatory Visit: Payer: Self-pay | Admitting: Family

## 2023-04-01 DIAGNOSIS — J441 Chronic obstructive pulmonary disease with (acute) exacerbation: Secondary | ICD-10-CM

## 2023-04-02 ENCOUNTER — Other Ambulatory Visit: Payer: Self-pay | Admitting: Family

## 2023-04-08 ENCOUNTER — Other Ambulatory Visit: Payer: Self-pay | Admitting: Family

## 2023-04-08 DIAGNOSIS — I10 Essential (primary) hypertension: Secondary | ICD-10-CM

## 2023-04-13 ENCOUNTER — Other Ambulatory Visit: Payer: Self-pay | Admitting: Family

## 2023-04-13 DIAGNOSIS — I10 Essential (primary) hypertension: Secondary | ICD-10-CM

## 2023-04-17 ENCOUNTER — Encounter: Payer: Self-pay | Admitting: Family

## 2023-04-17 ENCOUNTER — Ambulatory Visit: Payer: Medicare HMO | Admitting: Family

## 2023-04-17 VITALS — BP 136/72 | HR 62 | Temp 97.7°F | Ht 69.0 in | Wt 214.0 lb

## 2023-04-17 DIAGNOSIS — I10 Essential (primary) hypertension: Secondary | ICD-10-CM

## 2023-04-17 DIAGNOSIS — E1142 Type 2 diabetes mellitus with diabetic polyneuropathy: Secondary | ICD-10-CM

## 2023-04-17 DIAGNOSIS — F331 Major depressive disorder, recurrent, moderate: Secondary | ICD-10-CM | POA: Diagnosis not present

## 2023-04-17 DIAGNOSIS — N529 Male erectile dysfunction, unspecified: Secondary | ICD-10-CM

## 2023-04-17 DIAGNOSIS — J438 Other emphysema: Secondary | ICD-10-CM | POA: Diagnosis not present

## 2023-04-17 DIAGNOSIS — Z720 Tobacco use: Secondary | ICD-10-CM | POA: Diagnosis not present

## 2023-04-17 DIAGNOSIS — Z0001 Encounter for general adult medical examination with abnormal findings: Secondary | ICD-10-CM

## 2023-04-17 DIAGNOSIS — G500 Trigeminal neuralgia: Secondary | ICD-10-CM

## 2023-04-17 DIAGNOSIS — I4891 Unspecified atrial fibrillation: Secondary | ICD-10-CM

## 2023-04-17 DIAGNOSIS — J441 Chronic obstructive pulmonary disease with (acute) exacerbation: Secondary | ICD-10-CM | POA: Diagnosis not present

## 2023-04-17 DIAGNOSIS — C760 Malignant neoplasm of head, face and neck: Secondary | ICD-10-CM

## 2023-04-17 DIAGNOSIS — C7989 Secondary malignant neoplasm of other specified sites: Secondary | ICD-10-CM

## 2023-04-17 DIAGNOSIS — Z550 Illiteracy and low-level literacy: Secondary | ICD-10-CM

## 2023-04-17 DIAGNOSIS — I480 Paroxysmal atrial fibrillation: Secondary | ICD-10-CM

## 2023-04-17 DIAGNOSIS — E785 Hyperlipidemia, unspecified: Secondary | ICD-10-CM

## 2023-04-17 DIAGNOSIS — G8929 Other chronic pain: Secondary | ICD-10-CM

## 2023-04-17 DIAGNOSIS — Z0000 Encounter for general adult medical examination without abnormal findings: Secondary | ICD-10-CM

## 2023-04-17 DIAGNOSIS — J301 Allergic rhinitis due to pollen: Secondary | ICD-10-CM

## 2023-04-17 DIAGNOSIS — Z79899 Other long term (current) drug therapy: Secondary | ICD-10-CM

## 2023-04-17 LAB — BAYER DCA HB A1C WAIVED: HB A1C (BAYER DCA - WAIVED): 5.5 % (ref 4.8–5.6)

## 2023-04-17 MED ORDER — HYDROCODONE-ACETAMINOPHEN 7.5-325 MG PO TABS: 1.0000 | ORAL_TABLET | Freq: Three times a day (TID) | ORAL | 0 refills | Status: DC | PRN

## 2023-04-17 MED ORDER — LORATADINE 10 MG PO TABS: 10.0000 mg | ORAL_TABLET | Freq: Every day | ORAL | 2 refills | Status: AC

## 2023-04-17 MED ORDER — TRELEGY ELLIPTA 100-62.5-25 MCG/ACT IN AEPB: INHALATION_SPRAY | RESPIRATORY_TRACT | 0 refills | Status: DC

## 2023-04-17 MED ORDER — FLUTICASONE PROPIONATE 50 MCG/ACT NA SUSP: 2.0000 | Freq: Every day | NASAL | 3 refills | Status: AC

## 2023-04-17 MED ORDER — AMLODIPINE BESYLATE 10 MG PO TABS: 10.0000 mg | ORAL_TABLET | Freq: Every day | ORAL | 3 refills | Status: AC

## 2023-04-17 MED ORDER — ALBUTEROL SULFATE HFA 108 (90 BASE) MCG/ACT IN AERS: INHALATION_SPRAY | RESPIRATORY_TRACT | 1 refills | Status: DC

## 2023-04-17 MED ORDER — BLOOD GLUCOSE METER KIT: PACK | 0 refills | Status: AC

## 2023-04-17 MED ORDER — CETIRIZINE HCL 10 MG PO TABS: 10.0000 mg | ORAL_TABLET | Freq: Every day | ORAL | 1 refills | Status: DC

## 2023-04-17 MED ORDER — DULOXETINE HCL 60 MG PO CPEP: 60.0000 mg | ORAL_CAPSULE | Freq: Every day | ORAL | 3 refills | Status: AC

## 2023-04-17 MED ORDER — GABAPENTIN 800 MG PO TABS: ORAL_TABLET | ORAL | 2 refills | Status: DC

## 2023-04-17 MED ORDER — SILDENAFIL CITRATE 100 MG PO TABS: 50.0000 mg | ORAL_TABLET | Freq: Every day | ORAL | 0 refills | Status: DC | PRN

## 2023-04-17 MED ORDER — BACLOFEN 10 MG PO TABS: ORAL_TABLET | ORAL | 0 refills | Status: DC

## 2023-04-17 MED ORDER — ATORVASTATIN CALCIUM 40 MG PO TABS: 40.0000 mg | ORAL_TABLET | Freq: Every day | ORAL | 0 refills | Status: DC

## 2023-04-17 MED ORDER — CARVEDILOL 3.125 MG PO TABS: 3.1250 mg | ORAL_TABLET | Freq: Two times a day (BID) | ORAL | 0 refills | Status: DC

## 2023-04-17 MED ORDER — OXCARBAZEPINE 150 MG PO TABS
150.0000 mg | ORAL_TABLET | Freq: Two times a day (BID) | ORAL | 5 refills | Status: AC
Start: 2023-04-17 — End: ?

## 2023-04-17 MED ORDER — LISINOPRIL 5 MG PO TABS: 5.0000 mg | ORAL_TABLET | Freq: Every day | ORAL | 0 refills | Status: DC

## 2023-04-17 MED ORDER — APIXABAN 5 MG PO TABS: ORAL_TABLET | ORAL | 2 refills | Status: DC

## 2023-04-17 NOTE — Progress Notes (Signed)
 Subjective:    Patient ID: Matthew [name], male    DOB: 1960-12-23, 63 y.o.   MRN: [id number]  Chief Complaint  Patient presents with   Medical Management of Chronic Issues   PT presents to the office today for CPE, chronic follow up and pain management. He has hx of facial trauma and mandible fracture on 04/05/21. He is also being treated for metastatic squamous neck cancer. He completed radiation. He is followed by Oncologists.   He had PET scan on 03/10/22 that showed, "1. Interval resolution of previously demonstrated hypermetabolic right cervical adenopathy consistent with response to therapy. 2. No evidence of residual hypermetabolic disease in the neck, chest, abdomen or pelvis. 3. Clustered nodularity in the left lower lobe and lingula without associated hypermetabolic activity, likely postinflammatory."     He has COPD and continues to smoke a 1 pack a day. Has intermittent SOB that is worse with pollen. Uses Trelegy.   He is a diabetic, HTN, COPD, and hx of CVA. He takes Eliquis daily.    Has trigeminal neuralgia and was followed by Neurologists, but has not seen them recently.    Has thoracic aortic arteriosclerosis and CAD and takes Lipitor daily.  Pt complaining of ED and requesting medications.  Hypertension This is a chronic problem. The current episode started more than 1 year ago. The problem has been resolved since onset. The problem is controlled. Associated symptoms include neck pain. Pertinent negatives include no blurred vision, peripheral edema or shortness of breath. Risk factors for coronary artery disease include dyslipidemia, diabetes mellitus, obesity, male gender, sedentary lifestyle and smoking/tobacco exposure. The current treatment provides moderate improvement.  Diabetes He presents for his follow-up diabetic visit. He has type 2 diabetes mellitus. Pertinent negatives for diabetes include no blurred vision and no foot paresthesias. Symptoms are stable.  Diabetic complications include peripheral neuropathy. Risk factors for coronary artery disease include dyslipidemia, diabetes mellitus, hypertension, sedentary lifestyle, post-menopausal and male sex. He is following a generally unhealthy diet. His overall blood glucose range is 90-110 mg/dl.  Hyperlipidemia This is a chronic problem. The current episode started more than 1 year ago. The problem is controlled. Recent lipid tests were reviewed and are normal. Exacerbating diseases include obesity. Pertinent negatives include no shortness of breath. Current antihyperlipidemic treatment includes statins. The current treatment provides moderate improvement of lipids. Risk factors for coronary artery disease include dyslipidemia, diabetes mellitus, hypertension, male sex and a sedentary lifestyle.  Depression        This is a chronic problem.  The current episode started more than 1 year ago.   The problem occurs intermittently.  Associated symptoms include hopelessness and sad.  Associated symptoms include no helplessness.  Past treatments include SNRIs - Serotonin and norepinephrine reuptake inhibitors. Back Pain This is a chronic problem. The current episode started more than 1 year ago. The problem occurs intermittently. The problem has been waxing and waning since onset. The pain is present in the lumbar spine. The quality of the pain is described as aching. The pain is at a severity of 7/10. The pain is moderate. Risk factors include obesity. He has tried analgesics and muscle relaxant for the symptoms. The treatment provided mild relief.      Review of Systems  Eyes:  Negative for blurred vision.  Respiratory:  Negative for shortness of breath.   Musculoskeletal:  Positive for neck pain.  All other systems reviewed and are negative.  Family History  Problem Relation Age of Onset  Hypertension Mother    Diabetes Mother    Hypertension Father    Diabetes Father    Cancer Father    Emphysema  Father    Early death Brother    Social History   Socioeconomic History   Marital status: Legally Separated    Spouse name: Not on file   Number of children: 3   Years of education: 9   Highest education level: 9th grade  Occupational History   Occupation: Disabled  Tobacco Use   Smoking status: Every Day    Current packs/day: 1.50    Average packs/day: 1.5 packs/day for 47.7 years (71.6 ttl pk-yrs)    Types: Cigarettes    Start date: 07/23/1975   Smokeless tobacco: Never  Vaping Use   Vaping status: Never Used  Substance and Sexual Activity   Alcohol use: Not Currently    Comment: 08/21/2012 "quit drinking ~ 1985"   Drug use: No   Sexual activity: Not Currently  Other Topics Concern   Not on file  Social History Narrative   Highest level of education: 9th grade   Right handed   Drinks caffeine   [place]      Social Drivers of Health   Financial Resource Strain: Low Risk  (03/23/2021)   Overall Financial Resource Strain (CARDIA)    Difficulty of Paying Living Expenses: Not very hard  Food Insecurity: Food Insecurity Present (03/23/2021)   Hunger Vital Sign    Worried About Running Out of Food in the Last Year: Sometimes true    Ran Out of Food in the Last Year: Never true  Transportation Needs: Unmet Transportation Needs (10/04/2021)   PRAPARE - Administrator, Civil Service (Medical): Yes    Lack of Transportation (Non-Medical): No  Physical Activity: Insufficiently Active (03/23/2021)   Exercise Vital Sign    Days of Exercise per Week: 7 days    Minutes of Exercise per Session: 20 min  Stress: No Stress Concern Present (03/23/2021)   [employer] of Occupational Health - Occupational Stress Questionnaire    Feeling of Stress : Only a little  Recent Concern: Stress - Stress Concern Present (03/07/2021)   [employer] of Occupational Health - Occupational Stress Questionnaire    Feeling of Stress : To some extent  Social  Connections: Socially Isolated (03/23/2021)   Social Connection and Isolation Panel [NHANES]    Frequency of Communication with Friends and Family: More than three times a week    Frequency of Social Gatherings with Friends and Family: More than three times a week    Attends Religious Services: Never    Database administrator or Organizations: No    Attends Banker Meetings: Never    Marital Status: Separated       Objective:   Physical Exam Vitals reviewed.  Constitutional:      General: He is not in acute distress.    Appearance: He is well-developed. He is obese.  HENT:     Head: Normocephalic.     Right Ear: Tympanic membrane normal.     Left Ear: Tympanic membrane normal.  Eyes:     General:        Right eye: No discharge.        Left eye: No discharge.     Pupils: Pupils are equal, round, and reactive to light.  Neck:     Thyroid: No thyromegaly.  Cardiovascular:     Rate and  Rhythm: Normal rate and regular rhythm.     Heart sounds: Normal heart sounds. No murmur heard. Pulmonary:     Effort: Pulmonary effort is normal. No respiratory distress.     Breath sounds: Normal breath sounds. No wheezing.  Abdominal:     General: Bowel sounds are normal. There is no distension.     Palpations: Abdomen is soft.     Tenderness: There is no abdominal tenderness.  Musculoskeletal:        General: Tenderness present.     Cervical back: Normal range of motion and neck supple.     Comments: Pain in lumbar with flexion and extension   Skin:    General: Skin is warm and dry.     Findings: Rash present. No erythema.     Comments: Multiple erythemas scabs on bilateral arms   Neurological:     Mental Status: He is alert and oriented to person, place, and time.     Cranial Nerves: No cranial nerve deficit.     Deep Tendon Reflexes: Reflexes are normal and symmetric.  Psychiatric:        Behavior: Behavior normal.        Thought Content: Thought content normal.         Judgment: Judgment normal.       BP 136/72   Pulse 62   Temp 97.7 F (36.5 C) (Temporal)   Ht 5\' 9"  (1.753 m)   Wt 214 lb (97.1 kg)   SpO2 95%   BMI 31.60 kg/m      Assessment & Plan:   Cheyenne Schumm Fasnacht comes in today with chief complaint of Medical Management of Chronic Issues   Diagnosis and orders addressed:  1. Chronic obstructive pulmonary disease with acute exacerbation (HCC) - albuterol (VENTOLIN HFA) 108 (90 Base) MCG/ACT inhaler; TAKE 2 PUFFS BY MOUTH EVERY 6 HOURS AS NEEDED FOR WHEEZE OR SHORTNESS OF BREATH  Dispense: 18 each; Refill: 1 - CMP14+EGFR - CBC with Differential/Platelet  2. COPD exacerbation (HCC) - albuterol (VENTOLIN HFA) 108 (90 Base) MCG/ACT inhaler; TAKE 2 PUFFS BY MOUTH EVERY 6 HOURS AS NEEDED FOR WHEEZE OR SHORTNESS OF BREATH  Dispense: 18 each; Refill: 1 - CMP14+EGFR - CBC with Differential/Platelet  3. Essential hypertension - amLODipine (NORVASC) 10 MG tablet; Take 1 tablet (10 mg total) by mouth daily. (NEEDS TO BE SEEN BEFORE NEXT REFILL)  Dispense: 90 tablet; Refill: 3 - lisinopril (ZESTRIL) 5 MG tablet; Take 1 tablet (5 mg total) by mouth daily.  Dispense: 90 tablet; Refill: 0 - CMP14+EGFR - CBC with Differential/Platelet  4. Hyperlipidemia, unspecified hyperlipidemia type - atorvastatin (LIPITOR) 40 MG tablet; Take 1 tablet (40 mg total) by mouth daily.  Dispense: 90 tablet; Refill: 0 - CMP14+EGFR - CBC with Differential/Platelet - Lipid panel  5. Trigeminal neuralgia - baclofen (LIORESAL) 10 MG tablet; TAKE 1 TABLET BY MOUTH THREE TIMES A DAY AS NEEDED FOR MUSCLE SPASM  Dispense: 270 tablet; Refill: 0 - DULoxetine (CYMBALTA) 60 MG capsule; Take 1 capsule (60 mg total) by mouth daily. (NEEDS TO BE SEEN BEFORE NEXT REFILL)  Dispense: 90 capsule; Refill: 3 - gabapentin (NEURONTIN) 800 MG tablet; TAKE 1 TABLET IN THE MORNING, AT NOON, IN THE EVENING, AND AT BEDTIME.  Dispense: 120 tablet; Refill: 2 - CMP14+EGFR - CBC with  Differential/Platelet  6. Essential (primary) hypertension - carvedilol (COREG) 3.125 MG tablet; Take 1 tablet (3.125 mg total) by mouth 2 (two) times daily with a meal.  Dispense: 180 tablet; Refill:  0 - CMP14+EGFR - CBC with Differential/Platelet  7. Other emphysema (HCC) - cetirizine (ZYRTEC) 10 MG tablet; Take 1 tablet (10 mg total) by mouth daily.  Dispense: 90 tablet; Refill: 1 - CMP14+EGFR - CBC with Differential/Platelet  8. Tobacco use - cetirizine (ZYRTEC) 10 MG tablet; Take 1 tablet (10 mg total) by mouth daily.  Dispense: 90 tablet; Refill: 1 - CMP14+EGFR - CBC with Differential/Platelet  9. Moderate episode of recurrent major depressive disorder (HCC) - DULoxetine (CYMBALTA) 60 MG capsule; Take 1 capsule (60 mg total) by mouth daily. (NEEDS TO BE SEEN BEFORE NEXT REFILL)  Dispense: 90 capsule; Refill: 3 - CMP14+EGFR - CBC with Differential/Platelet  10. Atrial fibrillation with RVR (HCC) - apixaban (ELIQUIS) 5 MG TABS tablet; TAKE 1 TABLET BY MOUTH TWICE A DAY  Dispense: 180 tablet; Refill: 2 - CMP14+EGFR - CBC with Differential/Platelet  11. Controlled substance agreement signed - HYDROcodone-acetaminophen (NORCO) 7.5-325 MG tablet; Take 1 tablet by mouth every 8 (eight) hours as needed for moderate pain (pain score 4-6).  Dispense: 90 tablet; Refill: 0 - HYDROcodone-acetaminophen (NORCO) 7.5-325 MG tablet; Take 1 tablet by mouth every 8 (eight) hours as needed for moderate pain (pain score 4-6).  Dispense: 90 tablet; Refill: 0 - HYDROcodone-acetaminophen (NORCO) 7.5-325 MG tablet; Take 1 tablet by mouth every 8 (eight) hours as needed for moderate pain (pain score 4-6).  Dispense: 90 tablet; Refill: 0 - CMP14+EGFR - CBC with Differential/Platelet  12. Head and neck cancer (HCC)  - HYDROcodone-acetaminophen (NORCO) 7.5-325 MG tablet; Take 1 tablet by mouth every 8 (eight) hours as needed for moderate pain (pain score 4-6).  Dispense: 90 tablet; Refill: 0 -  HYDROcodone-acetaminophen (NORCO) 7.5-325 MG tablet; Take 1 tablet by mouth every 8 (eight) hours as needed for moderate pain (pain score 4-6).  Dispense: 90 tablet; Refill: 0 - HYDROcodone-acetaminophen (NORCO) 7.5-325 MG tablet; Take 1 tablet by mouth every 8 (eight) hours as needed for moderate pain (pain score 4-6).  Dispense: 90 tablet; Refill: 0 - Ambulatory referral to Hematology / Oncology - CMP14+EGFR - CBC with Differential/Platelet  13. Metastatic squamous neck cancer with occult primary (HCC) - HYDROcodone-acetaminophen (NORCO) 7.5-325 MG tablet; Take 1 tablet by mouth every 8 (eight) hours as needed for moderate pain (pain score 4-6).  Dispense: 90 tablet; Refill: 0 - HYDROcodone-acetaminophen (NORCO) 7.5-325 MG tablet; Take 1 tablet by mouth every 8 (eight) hours as needed for moderate pain (pain score 4-6).  Dispense: 90 tablet; Refill: 0 - HYDROcodone-acetaminophen (NORCO) 7.5-325 MG tablet; Take 1 tablet by mouth every 8 (eight) hours as needed for moderate pain (pain score 4-6).  Dispense: 90 tablet; Refill: 0 - Ambulatory referral to Hematology / Oncology - CMP14+EGFR - CBC with Differential/Platelet  14. Seasonal allergic rhinitis due to pollen - loratadine (CLARITIN) 10 MG tablet; Take 1 tablet (10 mg total) by mouth daily.  Dispense: 90 tablet; Refill: 2 - CMP14+EGFR - CBC with Differential/Platelet  15. Annual physical exam (Primary) - Bayer DCA Hb A1c Waived - CMP14+EGFR - CBC with Differential/Platelet - Lipid panel - PSA, total and free - TSH  16. Type 2 diabetes mellitus with diabetic polyneuropathy, without long-term current use of insulin (HCC) - CMP14+EGFR - CBC with Differential/Platelet  17. PAF (paroxysmal atrial fibrillation) (HCC) - CMP14+EGFR - CBC with Differential/Platelet  18. Unable to read or write - CMP14+EGFR - CBC with Differential/Platelet  19. Chronic midline back pain, unspecified back location  - CMP14+EGFR - CBC with  Differential/Platelet  20. Primary hypertension - CMP14+EGFR -  CBC with Differential/Platelet  21. Erectile dysfunction, unspecified erectile dysfunction type Start Viagra  - sildenafil (VIAGRA) 100 MG tablet; Take 0.5-1 tablets (50-100 mg total) by mouth daily as needed for erectile dysfunction.  Dispense: 30 tablet; Refill: 0   Labs pending Patient reviewed in [place] controlled database, no flags noted. Contract and drug screen up dated today.  Encouraged patient to call Oncologists for follow up, referral placed Will give rx of Viagra, good rx coupon given Health Maintenance reviewed Diet and exercise encouraged  Follow up plan: 3 months    Jannifer Rodney, FNP

## 2023-04-17 NOTE — Patient Instructions (Signed)

## 2023-04-18 LAB — CMP14+EGFR
ALT: 14 IU/L (ref 0–44)
AST: 16 IU/L (ref 0–40)
Albumin: 4.2 g/dL (ref 3.9–4.9)
Alkaline Phosphatase: 109 IU/L (ref 44–121)
BUN/Creatinine Ratio: 13 (ref 10–24)
BUN: 9 mg/dL (ref 8–27)
Bilirubin Total: 0.3 mg/dL (ref 0.0–1.2)
CO2: 22 mmol/L (ref 20–29)
Calcium: 9 mg/dL (ref 8.6–10.2)
Chloride: 106 mmol/L (ref 96–106)
Creatinine, Ser: 0.69 mg/dL — ABNORMAL LOW (ref 0.76–1.27)
Globulin, Total: 2.5 g/dL (ref 1.5–4.5)
Glucose: 89 mg/dL (ref 70–99)
Potassium: 4.3 mmol/L (ref 3.5–5.2)
Sodium: 141 mmol/L (ref 134–144)
Total Protein: 6.7 g/dL (ref 6.0–8.5)
eGFR: 104 mL/min/{1.73_m2} (ref >59)

## 2023-04-18 LAB — PSA, TOTAL AND FREE
PSA, Free Pct: 15.4 %
PSA, Free: 0.2 ng/mL
Prostate Specific Ag, Serum: 1.3 ng/mL (ref 0.0–4.0)

## 2023-04-18 LAB — LIPID PANEL
Chol/HDL Ratio: 2.5 ratio (ref 0.0–5.0)
Cholesterol, Total: 132 mg/dL (ref 100–199)
HDL: 52 mg/dL (ref >39)
LDL Chol Calc (NIH): 60 mg/dL (ref 0–99)
Triglycerides: 108 mg/dL (ref 0–149)
VLDL Cholesterol Cal: 20 mg/dL (ref 5–40)

## 2023-04-18 LAB — CBC WITH DIFFERENTIAL/PLATELET
Basophils Absolute: 0.1 10*3/uL (ref 0.0–0.2)
Basos: 1 %
EOS (ABSOLUTE): 0.2 10*3/uL (ref 0.0–0.4)
Eos: 2 %
Hematocrit: 42.4 % (ref 37.5–51.0)
Hemoglobin: 14 g/dL (ref 13.0–17.7)
Immature Grans (Abs): 0.1 10*3/uL (ref 0.0–0.1)
Immature Granulocytes: 1 %
Lymphocytes Absolute: 1.1 10*3/uL (ref 0.7–3.1)
Lymphs: 17 %
MCH: 29.6 pg (ref 26.6–33.0)
MCHC: 33 g/dL (ref 31.5–35.7)
MCV: 90 fL (ref 79–97)
Monocytes Absolute: 0.7 10*3/uL (ref 0.1–0.9)
Monocytes: 10 %
Neutrophils Absolute: 4.6 10*3/uL (ref 1.4–7.0)
Neutrophils: 69 %
Platelets: 107 10*3/uL — ABNORMAL LOW (ref 150–450)
RBC: 4.73 x10E6/uL (ref 4.14–5.80)
RDW: 13.1 % (ref 11.6–15.4)
WBC: 6.7 10*3/uL (ref 3.4–10.8)

## 2023-04-18 LAB — TSH: TSH: 1.41 u[IU]/mL (ref 0.450–4.500)

## 2023-05-04 ENCOUNTER — Other Ambulatory Visit: Payer: Self-pay

## 2023-05-04 DIAGNOSIS — C7989 Secondary malignant neoplasm of other specified sites: Secondary | ICD-10-CM

## 2023-05-04 DIAGNOSIS — C760 Malignant neoplasm of head, face and neck: Secondary | ICD-10-CM

## 2023-05-04 NOTE — Progress Notes (Signed)
 Oncology Nurse Navigator Documentation   Matthew [name] was referred to radiation oncology by his PCP for follow up related to his previously treated head and neck cancer in 2023. Dr. Basilio Cairo would like for him to have a CT neck/chest prior to seeing him. I have called Matthew [name] number but was unable to leave a VM due to it not being set up yet. I did leave a VM with his son, Matthew [name] asking for a return call to get the CT scans and follow up with Dr. Basilio Cairo scheduled.   Hedda Slade RN, BSN, OCN Head & Neck Oncology Nurse Navigator [facility] at [facility] Phone # [phone]  Fax # [phone]

## 2023-05-05 ENCOUNTER — Other Ambulatory Visit: Payer: Self-pay | Admitting: Family

## 2023-05-08 ENCOUNTER — Other Ambulatory Visit: Payer: Self-pay | Admitting: Family

## 2023-05-14 ENCOUNTER — Other Ambulatory Visit: Payer: Self-pay | Admitting: Family

## 2023-05-15 NOTE — Progress Notes (Signed)
 Oncology Nurse Navigator Documentation   I attempted to call Matthew [name] again today and was able to speak to him. I explained that Dr. Basilio Cairo wanted a CT neck/chest completed and then she would see him for follow up and results. He is agreeable to the CT scan and follow up appointment. He requested that I send him the appointments to him via the mail so that he can arrange transportation. I have scheduled the appointments for 4/9 and 4/15 and will mail the letter out to him today.   Hedda Slade RN, BSN, OCN Head & Neck Oncology Nurse Navigator [facility] at [facility] Phone # [phone]  Fax # [phone]

## 2023-05-17 ENCOUNTER — Telehealth: Payer: Self-pay | Admitting: Family

## 2023-05-23 ENCOUNTER — Ambulatory Visit (HOSPITAL_COMMUNITY): Attending: Radiation Oncology

## 2023-05-24 ENCOUNTER — Telehealth: Payer: Self-pay | Admitting: *Deleted

## 2023-05-24 NOTE — Telephone Encounter (Signed)
 Called patient's son Jill Alexanders and lvm regarding rescheduling dad's missed scan, lvm for a return call

## 2023-05-24 NOTE — Progress Notes (Signed)
 Oncology Nurse Navigator Documentation   I attempted to call Mr. Matthew [name] to discuss his no show to his CT scans yesterday. He will need to be rescheduled and his follow up for the results with Dr. Basilio Cairo will need to be rescheduled. I was unable to talk with him or leave a VM due to his VM not being set up. I will attempt to call him again tomorrow. Per previous note in Epic, Darryl Nestle attempted to call his son as well and left a VM.   Hedda Slade RN, BSN, OCN Head & Neck Oncology Nurse Navigator [facility] at [facility] Phone # [phone]  Fax # [phone]

## 2023-05-28 ENCOUNTER — Other Ambulatory Visit: Payer: Self-pay | Admitting: Family

## 2023-05-29 ENCOUNTER — Ambulatory Visit: Payer: Self-pay | Admitting: Radiation Oncology

## 2023-05-31 DIAGNOSIS — L02213 Cutaneous abscess of chest wall: Secondary | ICD-10-CM | POA: Diagnosis not present

## 2023-05-31 DIAGNOSIS — Z720 Tobacco use: Secondary | ICD-10-CM | POA: Diagnosis not present

## 2023-05-31 DIAGNOSIS — L03313 Cellulitis of chest wall: Secondary | ICD-10-CM | POA: Diagnosis not present

## 2023-05-31 DIAGNOSIS — I10 Essential (primary) hypertension: Secondary | ICD-10-CM | POA: Diagnosis not present

## 2023-06-04 NOTE — Progress Notes (Signed)
 Oncology Nurse Navigator Documentation   I attempted to call Mr. Trieu again to reschedule his CT scans and follow up with Dr. Lurena Sally for results. I was unable to leave a VM after the phone ringing several times due to his VM not being set up. I will attempt again to reach him in the near future.   Lynetta Saran RN, BSN, OCN Head & Neck Oncology Nurse Navigator [facility] at [facility] Phone # [phone]  Fax # [phone]

## 2023-06-18 NOTE — Progress Notes (Signed)
 Oncology Nurse Navigator Documentation   I have attempted to call Matthew [name] again today to reschedule a post treatment CT neck/chest and then follow up with Dr. Lurena Sally for results. I called the number listed for him but was unable to leave a VM due to it being full. I then called his son [name] and left a detailed message asking for a return call and the reason it was needed. I will follow for a return call.    Lynetta Saran RN, BSN, OCN Head & Neck Oncology Nurse Navigator [facility] at [facility] Phone # [phone]  Fax # [phone]

## 2023-06-28 NOTE — Progress Notes (Signed)
 Oncology Nurse Navigator Documentation   I have made another attempt to call Mr. Trabucco to reschedule a post treatment CT neck/chest and follow up with Dr. Lurena Sally for results. I was unable to reach Mr. Stockstill due to the phone ringing until a message saying that voice mail was not set, therefore I was unable to leave a message. I also have not received a phone call back from his son after leaving my direct contact information on 06/18/23. This is my final attempt but will be happy to assist if I hear back from Mr. Hymes or his son.   Lynetta Saran RN, BSN, OCN Head & Neck Oncology Nurse Navigator [facility] at [facility] Phone # [phone]  Fax # [phone]

## 2023-07-09 DIAGNOSIS — L02414 Cutaneous abscess of left upper limb: Secondary | ICD-10-CM | POA: Diagnosis not present

## 2023-07-09 DIAGNOSIS — Z720 Tobacco use: Secondary | ICD-10-CM | POA: Diagnosis not present

## 2023-07-09 DIAGNOSIS — L03114 Cellulitis of left upper limb: Secondary | ICD-10-CM | POA: Diagnosis not present

## 2023-07-09 DIAGNOSIS — M7989 Other specified soft tissue disorders: Secondary | ICD-10-CM | POA: Diagnosis not present

## 2023-07-10 ENCOUNTER — Other Ambulatory Visit: Payer: Self-pay | Admitting: Family

## 2023-07-10 DIAGNOSIS — J441 Chronic obstructive pulmonary disease with (acute) exacerbation: Secondary | ICD-10-CM

## 2023-07-12 ENCOUNTER — Other Ambulatory Visit: Payer: Self-pay | Admitting: Family

## 2023-07-12 DIAGNOSIS — C7989 Secondary malignant neoplasm of other specified sites: Secondary | ICD-10-CM

## 2023-07-12 DIAGNOSIS — Z79899 Other long term (current) drug therapy: Secondary | ICD-10-CM

## 2023-07-12 DIAGNOSIS — C760 Malignant neoplasm of head, face and neck: Secondary | ICD-10-CM

## 2023-07-12 NOTE — Telephone Encounter (Unsigned)
 Copied from CRM [id number]. Topic: Clinical - Medication Refill >> Jul 12, 2023 11:31 AM Matthew [name] T wrote: Medication: HYDROcodone -acetaminophen  (NORCO) 7.5-325 MG tablet  Has the patient contacted their pharmacy? Yes  This is the patient's preferred pharmacy:  [phone] [place],  - [name] [place] [address] Phone: [phone] Fax: [phone]  Is this the correct pharmacy for this prescription? Yes  Has the prescription been filled recently? Yes  Is the patient out of the medication? Yes  Has the patient been seen for an appointment in the last year OR does the patient have an upcoming appointment? Yes  Can we respond through MyChart? Yes  Agent: Please be advised that Rx refills may take up to 3 business days. We ask that you follow-up with your pharmacy.

## 2023-07-12 NOTE — Telephone Encounter (Signed)
 NA/NVM requested medication Hydrocodone -Acetaminophen  is a controlled medication that has to be filled during a visit and will be done during visit on 07/17/23

## 2023-07-17 ENCOUNTER — Ambulatory Visit (INDEPENDENT_AMBULATORY_CARE_PROVIDER_SITE_OTHER)

## 2023-07-17 ENCOUNTER — Ambulatory Visit: Admitting: Family

## 2023-07-17 ENCOUNTER — Encounter: Payer: Self-pay | Admitting: Family

## 2023-07-17 VITALS — BP 120/77 | HR 63 | Temp 98.4°F | Ht 69.0 in | Wt 218.0 lb

## 2023-07-17 DIAGNOSIS — B9689 Other specified bacterial agents as the cause of diseases classified elsewhere: Secondary | ICD-10-CM | POA: Diagnosis not present

## 2023-07-17 DIAGNOSIS — I4891 Unspecified atrial fibrillation: Secondary | ICD-10-CM

## 2023-07-17 DIAGNOSIS — I10 Essential (primary) hypertension: Secondary | ICD-10-CM

## 2023-07-17 DIAGNOSIS — N529 Male erectile dysfunction, unspecified: Secondary | ICD-10-CM

## 2023-07-17 DIAGNOSIS — E1142 Type 2 diabetes mellitus with diabetic polyneuropathy: Secondary | ICD-10-CM | POA: Diagnosis not present

## 2023-07-17 DIAGNOSIS — M549 Dorsalgia, unspecified: Secondary | ICD-10-CM

## 2023-07-17 DIAGNOSIS — E785 Hyperlipidemia, unspecified: Secondary | ICD-10-CM | POA: Diagnosis not present

## 2023-07-17 DIAGNOSIS — C760 Malignant neoplasm of head, face and neck: Secondary | ICD-10-CM

## 2023-07-17 DIAGNOSIS — G500 Trigeminal neuralgia: Secondary | ICD-10-CM | POA: Diagnosis not present

## 2023-07-17 DIAGNOSIS — E669 Obesity, unspecified: Secondary | ICD-10-CM

## 2023-07-17 DIAGNOSIS — Z79899 Other long term (current) drug therapy: Secondary | ICD-10-CM

## 2023-07-17 DIAGNOSIS — Z122 Encounter for screening for malignant neoplasm of respiratory organs: Secondary | ICD-10-CM

## 2023-07-17 DIAGNOSIS — Z550 Illiteracy and low-level literacy: Secondary | ICD-10-CM

## 2023-07-17 DIAGNOSIS — Z1211 Encounter for screening for malignant neoplasm of colon: Secondary | ICD-10-CM

## 2023-07-17 DIAGNOSIS — J441 Chronic obstructive pulmonary disease with (acute) exacerbation: Secondary | ICD-10-CM | POA: Diagnosis not present

## 2023-07-17 DIAGNOSIS — G8929 Other chronic pain: Secondary | ICD-10-CM

## 2023-07-17 LAB — CMP14+EGFR
ALT: 20 IU/L (ref 0–44)
AST: 18 IU/L (ref 0–40)
Albumin: 4.5 g/dL (ref 3.9–4.9)
Alkaline Phosphatase: 111 IU/L (ref 44–121)
BUN/Creatinine Ratio: 16 (ref 10–24)
BUN: 12 mg/dL (ref 8–27)
Bilirubin Total: 0.2 mg/dL (ref 0.0–1.2)
CO2: 20 mmol/L (ref 20–29)
Calcium: 9 mg/dL (ref 8.6–10.2)
Chloride: 102 mmol/L (ref 96–106)
Creatinine, Ser: 0.77 mg/dL (ref 0.76–1.27)
Globulin, Total: 2.6 g/dL (ref 1.5–4.5)
Glucose: 119 mg/dL — ABNORMAL HIGH (ref 70–99)
Potassium: 4.4 mmol/L (ref 3.5–5.2)
Sodium: 138 mmol/L (ref 134–144)
Total Protein: 7.1 g/dL (ref 6.0–8.5)
eGFR: 101 mL/min/{1.73_m2} (ref >59)

## 2023-07-17 LAB — CBC WITH DIFFERENTIAL/PLATELET
Basophils Absolute: 0 10*3/uL (ref 0.0–0.2)
Basos: 1 %
EOS (ABSOLUTE): 0.2 10*3/uL (ref 0.0–0.4)
Eos: 3 %
Hematocrit: 45 % (ref 37.5–51.0)
Hemoglobin: 14.8 g/dL (ref 13.0–17.7)
Immature Grans (Abs): 0 10*3/uL (ref 0.0–0.1)
Immature Granulocytes: 0 %
Lymphocytes Absolute: 1 10*3/uL (ref 0.7–3.1)
Lymphs: 14 %
MCH: 30 pg (ref 26.6–33.0)
MCHC: 32.9 g/dL (ref 31.5–35.7)
MCV: 91 fL (ref 79–97)
Monocytes Absolute: 0.7 10*3/uL (ref 0.1–0.9)
Monocytes: 9 %
Neutrophils Absolute: 5.3 10*3/uL (ref 1.4–7.0)
Neutrophils: 73 %
Platelets: 212 10*3/uL (ref 150–450)
RBC: 4.93 x10E6/uL (ref 4.14–5.80)
RDW: 12.7 % (ref 11.6–15.4)
WBC: 7.2 10*3/uL (ref 3.4–10.8)

## 2023-07-17 LAB — BAYER DCA HB A1C WAIVED: HB A1C (BAYER DCA - WAIVED): 5.9 % — ABNORMAL HIGH (ref 4.8–5.6)

## 2023-07-17 LAB — HM DIABETES EYE EXAM

## 2023-07-17 MED ORDER — DOXYCYCLINE HYCLATE 100 MG PO TABS: 100.0000 mg | ORAL_TABLET | Freq: Two times a day (BID) | ORAL | 0 refills | Status: DC

## 2023-07-17 MED ORDER — METHYLPREDNISOLONE ACETATE 80 MG/ML IJ SUSP
80.0000 mg | Freq: Once | INTRAMUSCULAR | Status: AC
  Administered 2023-07-17: 80 mg via INTRAMUSCULAR

## 2023-07-17 MED ORDER — HYDROCODONE-ACETAMINOPHEN 7.5-325 MG PO TABS: 1.0000 | ORAL_TABLET | Freq: Three times a day (TID) | ORAL | 0 refills | Status: DC | PRN

## 2023-07-17 MED ORDER — HYDROCODONE-ACETAMINOPHEN 7.5-325 MG PO TABS
1.0000 | ORAL_TABLET | Freq: Three times a day (TID) | ORAL | 0 refills | Status: DC | PRN
Start: 2023-08-14 — End: 2023-10-17

## 2023-07-17 MED ORDER — CARVEDILOL 3.125 MG PO TABS: 3.1250 mg | ORAL_TABLET | Freq: Two times a day (BID) | ORAL | 0 refills | Status: DC

## 2023-07-17 MED ORDER — SILDENAFIL CITRATE 100 MG PO TABS
50.0000 mg | ORAL_TABLET | Freq: Every day | ORAL | 0 refills | Status: AC | PRN
Start: 2023-07-17 — End: ?

## 2023-07-17 MED ORDER — ALBUTEROL SULFATE HFA 108 (90 BASE) MCG/ACT IN AERS: INHALATION_SPRAY | RESPIRATORY_TRACT | 1 refills | Status: DC

## 2023-07-17 NOTE — Patient Instructions (Signed)

## 2023-07-17 NOTE — Progress Notes (Signed)
 [name] arrived 07/17/2023 and has given verbal consent to obtain images and complete their overdue diabetic retinal screening.  The images have been sent to an ophthalmologist or optometrist for review and interpretation.  Results will be sent back to Matthew Hem, FNP for review.  Patient has been informed they will be contacted when we receive the results via telephone or MyChart

## 2023-07-17 NOTE — Progress Notes (Signed)
 Subjective:    Patient ID: Matthew [name], male    DOB: 1960-12-24, 63 y.o.   MRN: [id number]  Chief Complaint  Patient presents with   Medical Management of Chronic Issues   Sinusitis   PT presents to the office today for chronic follow up and pain management. He has hx of facial trauma and mandible fracture on 04/05/21. He is also being treated for metastatic squamous neck cancer. He completed radiation. He is followed by Oncologists, but states he has not had a ride.   He had PET scan on 03/10/22 that showed, "1. Interval resolution of previously demonstrated hypermetabolic right cervical adenopathy consistent with response to therapy. 2. No evidence of residual hypermetabolic disease in the neck, chest, abdomen or pelvis. 3. Clustered nodularity in the left lower lobe and lingula without associated hypermetabolic activity, likely postinflammatory."    Note from Oncology Navigator on 06/28/23 states, "I have made another attempt to call Mr. Breon to reschedule a post treatment CT neck/chest and follow up with Dr. Lurena Sally for results. I was unable to reach Mr. Stailey due to the phone ringing until a message saying that voice mail was not set, therefore I was unable to leave a message. I also have not received a phone call back from his son after leaving my direct contact information on 06/18/23. This is my final attempt but will be happy to assist if I hear back from Mr. Delpino or his son. " He has COPD and continues to smoke a 1 pack a day. Has intermittent SOB that is worse with pollen. Uses Trelegy.   He is a diabetic, HTN, COPD, and hx of CVA. He takes Eliquis  daily.    Has trigeminal neuralgia and was followed by Neurologists, but has not seen them recently.    Has thoracic aortic arteriosclerosis and CAD and takes Lipitor daily.  Pt complaining of ED and requesting medications.  Hypertension This is a chronic problem. The current episode started more than 1 year ago. The problem has been  resolved since onset. The problem is controlled. Associated symptoms include headaches, malaise/fatigue and shortness of breath. Pertinent negatives include no blurred vision or peripheral edema. Risk factors for coronary artery disease include dyslipidemia, diabetes mellitus, obesity, male gender, sedentary lifestyle and smoking/tobacco exposure. The current treatment provides moderate improvement.  Diabetes He presents for his follow-up diabetic visit. He has type 2 diabetes mellitus. Hypoglycemia symptoms include headaches. Associated symptoms include foot paresthesias. Pertinent negatives for diabetes include no blurred vision. Symptoms are stable. Diabetic complications include peripheral neuropathy. Risk factors for coronary artery disease include dyslipidemia, diabetes mellitus, hypertension, sedentary lifestyle, post-menopausal and male sex. He is following a generally unhealthy diet. His overall blood glucose range is 90-110 mg/dl. Eye exam is not current.  Hyperlipidemia This is a chronic problem. The current episode started more than 1 year ago. The problem is controlled. Recent lipid tests were reviewed and are normal. Exacerbating diseases include obesity. Associated symptoms include shortness of breath. Current antihyperlipidemic treatment includes statins. The current treatment provides moderate improvement of lipids. Risk factors for coronary artery disease include dyslipidemia, diabetes mellitus, hypertension, male sex and a sedentary lifestyle.  Depression        This is a chronic problem.  The current episode started more than 1 year ago.   The problem occurs intermittently.  Associated symptoms include hopelessness, headaches and sad.  Associated symptoms include no helplessness.  Past treatments include SNRIs - Serotonin and norepinephrine reuptake inhibitors. Back Pain  This is a chronic problem. The current episode started more than 1 year ago. The problem occurs intermittently. The  problem has been waxing and waning since onset. The pain is present in the lumbar spine. The quality of the pain is described as aching. The pain is at a severity of 8/10. The pain is moderate. Associated symptoms include headaches. Risk factors include obesity. He has tried analgesics and muscle relaxant for the symptoms. The treatment provided mild relief.  Sinusitis This is a new problem. The current episode started 1 to 4 weeks ago. The problem has been gradually worsening since onset. There has been no fever. His pain is at a severity of 2/10. The pain is mild. Associated symptoms include congestion, coughing, headaches, a hoarse voice, shortness of breath, sinus pressure and a sore throat. Pertinent negatives include no ear pain. Past treatments include oral decongestants. The treatment provided mild relief.      Review of Systems  Constitutional:  Positive for malaise/fatigue.  HENT:  Positive for congestion, hoarse voice, sinus pressure and sore throat. Negative for ear pain.   Eyes:  Negative for blurred vision.  Respiratory:  Positive for cough and shortness of breath.   Musculoskeletal:  Positive for back pain.  Neurological:  Positive for headaches.  All other systems reviewed and are negative.  Family History  Problem Relation Age of Onset   Hypertension Mother    Diabetes Mother    Hypertension Father    Diabetes Father    Cancer Father    Emphysema Father    Early death Brother    Social History   Socioeconomic History   Marital status: Legally Separated    Spouse name: Not on file   Number of children: 3   Years of education: 9   Highest education level: 9th grade  Occupational History   Occupation: Disabled  Tobacco Use   Smoking status: Every Day    Current packs/day: 1.50    Average packs/day: 1.5 packs/day for 48.0 years (72.0 ttl pk-yrs)    Types: Cigarettes    Start date: 07/23/1975   Smokeless tobacco: Never  Vaping Use   Vaping status: Never Used   Substance and Sexual Activity   Alcohol use: Not Currently    Comment: 08/21/2012 "quit drinking ~ 1985"   Drug use: No   Sexual activity: Not Currently  Other Topics Concern   Not on file  Social History Narrative   Highest level of education: 9th grade   Right handed   Drinks caffeine   [place]      Social Drivers of Health   Financial Resource Strain: Low Risk  (03/23/2021)   Overall Financial Resource Strain (CARDIA)    Difficulty of Paying Living Expenses: Not very hard  Food Insecurity: Food Insecurity Present (03/23/2021)   Hunger Vital Sign    Worried About Running Out of Food in the Last Year: Sometimes true    Ran Out of Food in the Last Year: Never true  Transportation Needs: Unmet Transportation Needs (10/04/2021)   PRAPARE - Administrator, Civil Service (Medical): Yes    Lack of Transportation (Non-Medical): No  Physical Activity: Insufficiently Active (03/23/2021)   Exercise Vital Sign    Days of Exercise per Week: 7 days    Minutes of Exercise per Session: 20 min  Stress: No Stress Concern Present (03/23/2021)   [employer] of Occupational Health - Occupational Stress Questionnaire    Feeling of  Stress : Only a little  Recent Concern: Stress - Stress Concern Present (03/07/2021)   [employer] of Occupational Health - Occupational Stress Questionnaire    Feeling of Stress : To some extent  Social Connections: Socially Isolated (03/23/2021)   Social Connection and Isolation Panel [NHANES]    Frequency of Communication with Friends and Family: More than three times a week    Frequency of Social Gatherings with Friends and Family: More than three times a week    Attends Religious Services: Never    Database administrator or Organizations: No    Attends Banker Meetings: Never    Marital Status: Separated       Objective:   Physical Exam Vitals reviewed.  Constitutional:      General: He is not in acute  distress.    Appearance: He is well-developed. He is obese.  HENT:     Head: Normocephalic.     Right Ear: Tympanic membrane normal.     Left Ear: Tympanic membrane normal.  Eyes:     General:        Right eye: No discharge.        Left eye: No discharge.     Pupils: Pupils are equal, round, and reactive to light.  Neck:     Thyroid : No thyromegaly.  Cardiovascular:     Rate and Rhythm: Normal rate and regular rhythm.     Heart sounds: Normal heart sounds. No murmur heard. Pulmonary:     Effort: Pulmonary effort is normal. No respiratory distress.     Breath sounds: Wheezing and rhonchi present.  Abdominal:     General: Bowel sounds are normal. There is no distension.     Palpations: Abdomen is soft.     Tenderness: There is no abdominal tenderness.  Musculoskeletal:        General: Tenderness present.     Cervical back: Normal range of motion and neck supple.     Comments: Pain in lumbar with flexion and extension   Skin:    General: Skin is warm and dry.     Findings: No erythema or rash.     Comments:    Neurological:     Mental Status: He is alert and oriented to person, place, and time.     Cranial Nerves: No cranial nerve deficit.     Deep Tendon Reflexes: Reflexes are normal and symmetric.  Psychiatric:        Behavior: Behavior normal.        Thought Content: Thought content normal.        Judgment: Judgment normal.     Diabetic Foot Exam - Simple   Simple Foot Form Diabetic Foot exam was performed with the following findings: Yes 07/17/2023 10:08 AM  Visual Inspection No deformities, no ulcerations, no other skin breakdown bilaterally: Yes Sensation Testing Intact to touch and monofilament testing bilaterally: Yes Pulse Check Posterior Tibialis and Dorsalis pulse intact bilaterally: Yes Comments Toenails thick and discolored        BP 120/77   Pulse 63   Temp 98.4 F (36.9 C) (Temporal)   Ht 5\' 9"  (1.753 m)   Wt 218 lb (98.9 kg)   SpO2 94%   BMI  32.19 kg/m      Assessment & Plan:   Reice Bienvenue Poffenberger comes in today with chief complaint of Medical Management of Chronic Issues and Sinusitis   Diagnosis and orders addressed:  1. Chronic obstructive pulmonary disease with  acute exacerbation (HCC) - albuterol  (VENTOLIN  HFA) 108 (90 Base) MCG/ACT inhaler; TAKE 2 PUFFS BY MOUTH EVERY 6 HOURS AS NEEDED FOR WHEEZE OR SHORTNESS OF BREATH  Dispense: 18 each; Refill: 1 - CMP14+EGFR - CBC with Differential/Platelet  2. COPD exacerbation (HCC) - albuterol  (VENTOLIN  HFA) 108 (90 Base) MCG/ACT inhaler; TAKE 2 PUFFS BY MOUTH EVERY 6 HOURS AS NEEDED FOR WHEEZE OR SHORTNESS OF BREATH  Dispense: 18 each; Refill: 1 - CMP14+EGFR - CBC with Differential/Platelet  3. Essential (primary) hypertension - carvedilol  (COREG ) 3.125 MG tablet; Take 1 tablet (3.125 mg total) by mouth 2 (two) times daily with a meal.  Dispense: 180 tablet; Refill: 0 - CMP14+EGFR - CBC with Differential/Platelet  4. Controlled substance agreement signed  - HYDROcodone -acetaminophen  (NORCO) 7.5-325 MG tablet; Take 1 tablet by mouth every 8 (eight) hours as needed for moderate pain (pain score 4-6).  Dispense: 90 tablet; Refill: 0 - HYDROcodone -acetaminophen  (NORCO) 7.5-325 MG tablet; Take 1 tablet by mouth every 8 (eight) hours as needed for moderate pain (pain score 4-6).  Dispense: 90 tablet; Refill: 0 - HYDROcodone -acetaminophen  (NORCO) 7.5-325 MG tablet; Take 1 tablet by mouth every 8 (eight) hours as needed for moderate pain (pain score 4-6).  Dispense: 90 tablet; Refill: 0 - CMP14+EGFR - CBC with Differential/Platelet  5. Head and neck cancer (HCC) - HYDROcodone -acetaminophen  (NORCO) 7.5-325 MG tablet; Take 1 tablet by mouth every 8 (eight) hours as needed for moderate pain (pain score 4-6).  Dispense: 90 tablet; Refill: 0 - HYDROcodone -acetaminophen  (NORCO) 7.5-325 MG tablet; Take 1 tablet by mouth every 8 (eight) hours as needed for moderate pain (pain score 4-6).   Dispense: 90 tablet; Refill: 0 - HYDROcodone -acetaminophen  (NORCO) 7.5-325 MG tablet; Take 1 tablet by mouth every 8 (eight) hours as needed for moderate pain (pain score 4-6).  Dispense: 90 tablet; Refill: 0 - CMP14+EGFR - CBC with Differential/Platelet  6. Erectile dysfunction, unspecified erectile dysfunction type - sildenafil  (VIAGRA ) 100 MG tablet; Take 0.5-1 tablets (50-100 mg total) by mouth daily as needed for erectile dysfunction.  Dispense: 30 tablet; Refill: 0 - CMP14+EGFR - CBC with Differential/Platelet  7. Primary hypertension (Primary)  - CMP14+EGFR - CBC with Differential/Platelet  8. Trigeminal neuralgia - CMP14+EGFR - CBC with Differential/Platelet  9. Hyperlipidemia, unspecified hyperlipidemia type  - CMP14+EGFR - CBC with Differential/Platelet  10. Obesity (BMI 30-39.9)  - CMP14+EGFR - CBC with Differential/Platelet  11. Chronic midline back pain, unspecified back location - CMP14+EGFR - CBC with Differential/Platelet  12. Atrial fibrillation with RVR (HCC)  - CMP14+EGFR - CBC with Differential/Platelet  13. Unable to read or write  - CMP14+EGFR - CBC with Differential/Platelet  14. Type 2 diabetes mellitus with diabetic polyneuropathy, without long-term current use of insulin  (HCC) - Bayer DCA Hb A1c Waived - CMP14+EGFR - CBC with Differential/Platelet  15. Acute bacterial bronchitis  - doxycycline  (VIBRA -TABS) 100 MG tablet; Take 1 tablet (100 mg total) by mouth 2 (two) times daily.  Dispense: 20 tablet; Refill: 0 - CMP14+EGFR - CBC with Differential/Platelet - methylPREDNISolone  acetate (DEPO-MEDROL ) injection 80 mg  16. Encounter for screening for lung cancer - Ambulatory Referral Lung Cancer Screening [facility] - CMP14+EGFR - CBC with Differential/Platelet  17. Colon cancer screening - Cologuard - CMP14+EGFR - CBC with Differential/Platelet  Called and left Oncology Navigator a message to try to set pt up with repeat CT  scan. I have given patient [employer] number for transportation. Pt does not read or write and can not drive to [place]. Pt does not have  phone on him today. I was going to delete his voice mails for him. States he does not know how to do this. His son is coming in this weekend and I discussed with patient to try to talk to him to help him schedule these appointment and delete his voice mails.  Labs pending Patient reviewed in [place] controlled database, no flags noted. Contract and drug screen up dated today.  Will give rx of Viagra , good rx coupon given Health Maintenance reviewed Diet and exercise encouraged Apporx 40 mins spent with patient, chart review, and patient education.    Follow up plan: 3 months    Tommas Fragmin, FNP

## 2023-07-23 ENCOUNTER — Ambulatory Visit: Payer: Self-pay | Admitting: Family

## 2023-07-29 ENCOUNTER — Other Ambulatory Visit: Payer: Self-pay | Admitting: Family

## 2023-07-29 DIAGNOSIS — G500 Trigeminal neuralgia: Secondary | ICD-10-CM

## 2023-07-31 ENCOUNTER — Encounter: Payer: Self-pay | Admitting: Emergency Medicine

## 2023-08-01 ENCOUNTER — Telehealth: Payer: Self-pay | Admitting: Family

## 2023-09-05 ENCOUNTER — Other Ambulatory Visit: Payer: Self-pay | Admitting: Family

## 2023-09-05 DIAGNOSIS — J441 Chronic obstructive pulmonary disease with (acute) exacerbation: Secondary | ICD-10-CM

## 2023-09-26 ENCOUNTER — Other Ambulatory Visit: Payer: Self-pay | Admitting: Family

## 2023-09-27 ENCOUNTER — Other Ambulatory Visit: Payer: Self-pay | Admitting: Family

## 2023-09-27 DIAGNOSIS — E785 Hyperlipidemia, unspecified: Secondary | ICD-10-CM

## 2023-09-28 ENCOUNTER — Other Ambulatory Visit: Payer: Self-pay | Admitting: Family

## 2023-09-28 DIAGNOSIS — G500 Trigeminal neuralgia: Secondary | ICD-10-CM

## 2023-10-03 ENCOUNTER — Other Ambulatory Visit: Payer: Self-pay | Admitting: Family

## 2023-10-03 DIAGNOSIS — I10 Essential (primary) hypertension: Secondary | ICD-10-CM

## 2023-10-12 ENCOUNTER — Telehealth: Payer: Self-pay | Admitting: Family

## 2023-10-12 DIAGNOSIS — Z79899 Other long term (current) drug therapy: Secondary | ICD-10-CM

## 2023-10-12 DIAGNOSIS — C760 Malignant neoplasm of head, face and neck: Secondary | ICD-10-CM

## 2023-10-12 NOTE — Telephone Encounter (Signed)
 Copied from CRM [id number]. Topic: Clinical - Medication Refill >> Oct 12, 2023 10:55 AM Rosaria E wrote: Medication:  HYDROcodone -acetaminophen  (NORCO) 7.5-325 MG tablet    Has the patient contacted their pharmacy? Yes (Agent: If no, request that the patient contact the pharmacy for the refill. If patient does not wish to contact the pharmacy document the reason why and proceed with request.) (Agent: If yes, when and what did the pharmacy advise?)  This is the patient's preferred pharmacy:  [id number] [place] [place] - [address] [place] [place] [address] Phone: [phone] Fax: [phone]  Is this the correct pharmacy for this prescription? Yes If no, delete pharmacy and type the correct one.   Has the prescription been filled recently? Yes  Is the patient out of the medication? Yes  Has the patient been seen for an appointment in the last year OR does the patient have an upcoming appointment? Yes  Can we respond through MyChart? Yes  Agent: Please be advised that Rx refills may take up to 3 business days. We ask that you follow-up with your pharmacy.

## 2023-10-16 ENCOUNTER — Telehealth: Payer: Self-pay

## 2023-10-16 NOTE — Telephone Encounter (Signed)
 Ok to double book me later in the week.

## 2023-10-16 NOTE — Telephone Encounter (Signed)
 Copied from CRM [id number]. Topic: General - Call Back - No Documentation >> Oct 16, 2023 11:27 AM Wess RAMAN wrote: Reason for CRM: Patient would like a call back from Lavell Lye, FNP or her nurse to discuss medication.  Callback #: [phone]

## 2023-10-16 NOTE — Telephone Encounter (Signed)
 Appt made.

## 2023-10-16 NOTE — Telephone Encounter (Signed)
 Called and spoke to patient he has  appt on 9/08 your double booked everyday before then he is requesting we send something in for pain.

## 2023-10-18 ENCOUNTER — Ambulatory Visit (INDEPENDENT_AMBULATORY_CARE_PROVIDER_SITE_OTHER): Admitting: Family

## 2023-10-18 ENCOUNTER — Encounter: Payer: Self-pay | Admitting: Family

## 2023-10-18 VITALS — BP 134/79 | HR 60 | Temp 98.1°F | Ht 69.0 in | Wt 206.2 lb

## 2023-10-18 DIAGNOSIS — Z79899 Other long term (current) drug therapy: Secondary | ICD-10-CM

## 2023-10-18 DIAGNOSIS — G500 Trigeminal neuralgia: Secondary | ICD-10-CM | POA: Diagnosis not present

## 2023-10-18 DIAGNOSIS — J438 Other emphysema: Secondary | ICD-10-CM | POA: Diagnosis not present

## 2023-10-18 DIAGNOSIS — E1142 Type 2 diabetes mellitus with diabetic polyneuropathy: Secondary | ICD-10-CM

## 2023-10-18 DIAGNOSIS — C760 Malignant neoplasm of head, face and neck: Secondary | ICD-10-CM

## 2023-10-18 DIAGNOSIS — E785 Hyperlipidemia, unspecified: Secondary | ICD-10-CM

## 2023-10-18 DIAGNOSIS — I6930 Unspecified sequelae of cerebral infarction: Secondary | ICD-10-CM

## 2023-10-18 DIAGNOSIS — Z23 Encounter for immunization: Secondary | ICD-10-CM

## 2023-10-18 DIAGNOSIS — E559 Vitamin D deficiency, unspecified: Secondary | ICD-10-CM

## 2023-10-18 DIAGNOSIS — K047 Periapical abscess without sinus: Secondary | ICD-10-CM

## 2023-10-18 DIAGNOSIS — I10 Essential (primary) hypertension: Secondary | ICD-10-CM | POA: Diagnosis not present

## 2023-10-18 DIAGNOSIS — F331 Major depressive disorder, recurrent, moderate: Secondary | ICD-10-CM | POA: Diagnosis not present

## 2023-10-18 LAB — BAYER DCA HB A1C WAIVED: HB A1C (BAYER DCA - WAIVED): 5.2 % (ref 4.8–5.6)

## 2023-10-18 MED ORDER — HYDROCODONE-ACETAMINOPHEN 7.5-325 MG PO TABS: 1.0000 | ORAL_TABLET | Freq: Three times a day (TID) | ORAL | 0 refills | Status: DC | PRN

## 2023-10-18 MED ORDER — CARVEDILOL 3.125 MG PO TABS: 3.1250 mg | ORAL_TABLET | Freq: Two times a day (BID) | ORAL | 0 refills | Status: DC

## 2023-10-18 MED ORDER — CLINDAMYCIN HCL 300 MG PO CAPS: 300.0000 mg | ORAL_CAPSULE | Freq: Three times a day (TID) | ORAL | 0 refills | Status: AC

## 2023-10-18 MED ORDER — LISINOPRIL 5 MG PO TABS: 5.0000 mg | ORAL_TABLET | Freq: Every day | ORAL | 0 refills | Status: DC

## 2023-10-18 NOTE — Progress Notes (Signed)
 Subjective:    Patient ID: Matthew [name], male    DOB: 01-01-1961, 63 y.o.   MRN: [id number]  Chief Complaint  Patient presents with   Medication Refill   PT presents to the office today for chronic follow up and pain management. He has hx of facial trauma and mandible fracture on 04/05/21. He is also being treated for metastatic squamous neck cancer. He completed radiation. He is followed by Oncologists, but states he has not had a ride.   He had PET scan on 03/10/22 that showed, 1. Interval resolution of previously demonstrated hypermetabolic right cervical adenopathy consistent with response to therapy. 2. No evidence of residual hypermetabolic disease in the neck, chest, abdomen or pelvis. 3. Clustered nodularity in the left lower lobe and lingula without associated hypermetabolic activity, likely postinflammatory.    Note from Oncology Navigator on 06/28/23 states, I have made another attempt to call Matthew [name] to reschedule a post treatment CT neck/chest and follow up with Dr. Izell for results. I was unable to reach Matthew [name] due to the phone ringing until a message saying that voice mail was not set, therefore I was unable to leave a message. I also have not received a phone call back from his son after leaving my direct contact information on 06/18/23. This is my final attempt but will be happy to assist if I hear back from Matthew [name] or his son.   He has COPD and continues to smoke a 1 pack a day. Has intermittent SOB that is worse with pollen. Uses Trelegy.   He is a diabetic, HTN, COPD, and hx of CVA. He takes Eliquis  daily.    Has trigeminal neuralgia and was followed by Neurologists, but has not seen them recently.    Has thoracic aortic arteriosclerosis and CAD and takes Lipitor daily.  Pt complaining of ED and requesting medications.   Complaining of right lower gum pain that started two weeks ago. Reports her pain is a 9 out 10.  Hypertension This is a chronic problem.  The current episode started more than 1 year ago. The problem has been resolved since onset. The problem is controlled. Associated symptoms include malaise/fatigue. Pertinent negatives include no blurred vision or peripheral edema. Risk factors for coronary artery disease include dyslipidemia, diabetes mellitus, obesity, male gender, sedentary lifestyle and smoking/tobacco exposure. The current treatment provides moderate improvement.  Diabetes He presents for his follow-up diabetic visit. He has type 2 diabetes mellitus. Associated symptoms include foot paresthesias. Pertinent negatives for diabetes include no blurred vision. Symptoms are stable. Diabetic complications include peripheral neuropathy. Risk factors for coronary artery disease include dyslipidemia, diabetes mellitus, hypertension, sedentary lifestyle, post-menopausal and male sex. He is following a generally unhealthy diet. His overall blood glucose range is 90-110 mg/dl. Eye exam is not current.  Hyperlipidemia This is a chronic problem. The current episode started more than 1 year ago. The problem is controlled. Recent lipid tests were reviewed and are normal. Exacerbating diseases include obesity. Current antihyperlipidemic treatment includes statins. The current treatment provides moderate improvement of lipids. Risk factors for coronary artery disease include dyslipidemia, diabetes mellitus, hypertension, male sex and a sedentary lifestyle.  Depression        This is a chronic problem.  The current episode started more than 1 year ago.   The problem occurs intermittently.  Associated symptoms include no helplessness, no hopelessness and not sad.  Past treatments include SNRIs - Serotonin and norepinephrine reuptake inhibitors. Back Pain This is  a chronic problem. The current episode started more than 1 year ago. The problem occurs intermittently. The problem has been waxing and waning since onset. The pain is present in the lumbar spine.  The quality of the pain is described as aching. The pain is at a severity of 6/10. The pain is moderate. Risk factors include obesity. He has tried analgesics and muscle relaxant for the symptoms. The treatment provided mild relief.      Review of Systems  Constitutional:  Positive for malaise/fatigue.  Eyes:  Negative for blurred vision.  Musculoskeletal:  Positive for back pain.  All other systems reviewed and are negative.  Family History  Problem Relation Age of Onset   Hypertension Mother    Diabetes Mother    Hypertension Father    Diabetes Father    Cancer Father    Emphysema Father    Early death Brother    Social History   Socioeconomic History   Marital status: Legally Separated    Spouse name: Not on file   Number of children: 3   Years of education: 9   Highest education level: 9th grade  Occupational History   Occupation: Disabled  Tobacco Use   Smoking status: Every Day    Current packs/day: 1.50    Average packs/day: 1.5 packs/day for 48.2 years (72.4 ttl pk-yrs)    Types: Cigarettes    Start date: 07/23/1975   Smokeless tobacco: Never  Vaping Use   Vaping status: Never Used  Substance and Sexual Activity   Alcohol use: Not Currently    Comment: 08/21/2012 quit drinking ~ 1985   Drug use: No   Sexual activity: Not Currently  Other Topics Concern   Not on file  Social History Narrative   Highest level of education: 9th grade   Right handed   Drinks caffeine   [place]      Social Drivers of Health   Financial Resource Strain: Low Risk  (03/23/2021)   Overall Financial Resource Strain (CARDIA)    Difficulty of Paying Living Expenses: Not very hard  Food Insecurity: Food Insecurity Present (03/23/2021)   Hunger Vital Sign    Worried About Running Out of Food in the Last Year: Sometimes true    Ran Out of Food in the Last Year: Never true  Transportation Needs: Unmet Transportation Needs (10/04/2021)   PRAPARE -  Administrator, Civil Service (Medical): Yes    Lack of Transportation (Non-Medical): No  Physical Activity: Insufficiently Active (03/23/2021)   Exercise Vital Sign    Days of Exercise per Week: 7 days    Minutes of Exercise per Session: 20 min  Stress: No Stress Concern Present (03/23/2021)   [employer] of Occupational Health - Occupational Stress Questionnaire    Feeling of Stress : Only a little  Recent Concern: Stress - Stress Concern Present (03/07/2021)   [employer] of Occupational Health - Occupational Stress Questionnaire    Feeling of Stress : To some extent  Social Connections: Socially Isolated (03/23/2021)   Social Connection and Isolation Panel    Frequency of Communication with Friends and Family: More than three times a week    Frequency of Social Gatherings with Friends and Family: More than three times a week    Attends Religious Services: Never    Database administrator or Organizations: No    Attends Banker Meetings: Never    Marital Status: Separated  Objective:   Physical Exam Vitals reviewed.  Constitutional:      General: He is not in acute distress.    Appearance: He is well-developed. He is obese.  HENT:     Head: Normocephalic.     Right Ear: Tympanic membrane normal.     Left Ear: Tympanic membrane normal.     Mouth/Throat:     Comments: No teeth present in lower gum, tenderness in right jaw and swelling  Eyes:     General:        Right eye: No discharge.        Left eye: No discharge.     Pupils: Pupils are equal, round, and reactive to light.  Neck:     Thyroid : No thyromegaly.  Cardiovascular:     Rate and Rhythm: Normal rate and regular rhythm.     Heart sounds: Normal heart sounds. No murmur heard. Pulmonary:     Effort: Pulmonary effort is normal. No respiratory distress.     Breath sounds: Wheezing and rhonchi present.  Abdominal:     General: Bowel sounds are normal. There is no distension.      Palpations: Abdomen is soft.     Tenderness: There is no abdominal tenderness.  Musculoskeletal:        General: Tenderness present.     Cervical back: Normal range of motion and neck supple.     Comments: Pain in lumbar with flexion and extension   Skin:    General: Skin is warm and dry.     Findings: No erythema or rash.     Comments:    Neurological:     Mental Status: He is alert and oriented to person, place, and time.     Cranial Nerves: No cranial nerve deficit.     Deep Tendon Reflexes: Reflexes are normal and symmetric.  Psychiatric:        Behavior: Behavior normal.        Thought Content: Thought content normal.        Judgment: Judgment normal.      BP 134/79   Pulse 60   Temp 98.1 F (36.7 C)   Ht 5' 9 (1.753 m)   Wt 206 lb 3.2 oz (93.5 kg)   SpO2 95%   BMI 30.45 kg/m      Assessment & Plan:   Zyshonne Malecha Bores comes in today with chief complaint of Medication Refill   Diagnosis and orders addressed:  1. Essential (primary) hypertension - carvedilol  (COREG ) 3.125 MG tablet; Take 1 tablet (3.125 mg total) by mouth 2 (two) times daily with a meal.  Dispense: 180 tablet; Refill: 0 - CMP14+EGFR - CBC with Differential/Platelet  2. Controlled substance agreement signed - HYDROcodone -acetaminophen  (NORCO) 7.5-325 MG tablet; Take 1 tablet by mouth every 8 (eight) hours as needed for moderate pain (pain score 4-6).  Dispense: 90 tablet; Refill: 0 - HYDROcodone -acetaminophen  (NORCO) 7.5-325 MG tablet; Take 1 tablet by mouth every 8 (eight) hours as needed for moderate pain (pain score 4-6).  Dispense: 90 tablet; Refill: 0 - HYDROcodone -acetaminophen  (NORCO) 7.5-325 MG tablet; Take 1 tablet by mouth every 8 (eight) hours as needed for moderate pain (pain score 4-6).  Dispense: 90 tablet; Refill: 0 - CMP14+EGFR - CBC with Differential/Platelet - ToxASSURE Select 13 (MW), Urine - Drug Screen 10 W/Conf, Serum  3. Head and neck cancer (HCC) -  HYDROcodone -acetaminophen  (NORCO) 7.5-325 MG tablet; Take 1 tablet by mouth every 8 (eight) hours as needed for moderate pain (  pain score 4-6).  Dispense: 90 tablet; Refill: 0 - HYDROcodone -acetaminophen  (NORCO) 7.5-325 MG tablet; Take 1 tablet by mouth every 8 (eight) hours as needed for moderate pain (pain score 4-6).  Dispense: 90 tablet; Refill: 0 - HYDROcodone -acetaminophen  (NORCO) 7.5-325 MG tablet; Take 1 tablet by mouth every 8 (eight) hours as needed for moderate pain (pain score 4-6).  Dispense: 90 tablet; Refill: 0 - CMP14+EGFR - CBC with Differential/Platelet - ToxASSURE Select 13 (MW), Urine - Ambulatory referral to Hematology / Oncology  4. Essential hypertension (Primary)  - lisinopril  (ZESTRIL ) 5 MG tablet; Take 1 tablet (5 mg total) by mouth daily.  Dispense: 90 tablet; Refill: 0 - CMP14+EGFR - CBC with Differential/Platelet  5. Encounter for immunization - Flu vaccine trivalent PF, 6mos and older(Flulaval,Afluria,Fluarix,Fluzone ) - CMP14+EGFR - CBC with Differential/Platelet  6. Primary hypertension  - CMP14+EGFR - CBC with Differential/Platelet  7. Trigeminal neuralgia  - CMP14+EGFR - CBC with Differential/Platelet  8. Other emphysema (HCC) - CMP14+EGFR - CBC with Differential/Platelet  9. Hyperlipidemia, unspecified hyperlipidemia type  - CMP14+EGFR - CBC with Differential/Platelet  10. Vitamin D  deficiency  - CMP14+EGFR - CBC with Differential/Platelet  11. Moderate episode of recurrent major depressive disorder (HCC)  - CMP14+EGFR - CBC with Differential/Platelet  12. Late effect of cerebrovascular accident (CVA)  - CMP14+EGFR - CBC with Differential/Platelet  13. Type 2 diabetes mellitus with diabetic polyneuropathy, without long-term current use of insulin  (HCC) - CMP14+EGFR - CBC with Differential/Platelet - Bayer DCA Hb A1c Waived  14. Dental infection - clindamycin  (CLEOCIN ) 300 MG capsule; Take 1 capsule (300 mg total) by mouth 3  (three) times daily for 10 days.  Dispense: 30 capsule; Refill: 0  Referral to Oncologists in [place]. Pt states he can drive to this location. Discussed in length the importance of follow up with Oncologists.  Labs pending Patient reviewed in [place] controlled database, no flags noted. Contract and drug screen up dated today.  Health Maintenance reviewed Diet and exercise encouraged Approx 40 mins spent with patient, education, and chart review   Follow up plan: 3 months    Bari Learn, FNP

## 2023-10-18 NOTE — Patient Instructions (Signed)
 Dental Pain: What It Means  Dental pain is often a sign that something is wrong with your teeth or gums. You can also have pain after a dental treatment. If you have dental pain, it's important to contact your dental care provider, especially if you don't know what's causing the pain.  Dental pain may hurt a lot or a little. It can be caused by many things, including: Tooth decay. This is also called cavities or caries. Infection. An abscess. This is when the inner part of the tooth is filled with pus. An injury or a crack in the tooth. Gum disease or gums that move back and expose the root of a tooth. Abnormal grinding or clenching of teeth. Not taking good care of your teeth. Sometimes the cause of pain isn't known. You may have pain all the time, or it may come and go. It may happen only when you're: Chewing. Exposed to hot or cold temperatures. Eating or drinking foods or drinks with a lot of sugar in them, such as soda or candy. Follow these instructions at home: Medicines Take your medicines only as told. If you were given antibiotics, take them as told. Do not stop taking them even if you start to feel better. Eating and drinking Avoid foods or drinks that cause you pain. These may include: Very hot or very cold foods or drinks. Sweet or sugary foods or drinks. Managing pain and swelling  Use ice or an ice pack on the painful area of your face as told. Put ice in a plastic bag. Place a towel between your skin and the ice. Leave the ice on for 20 minutes, 2-3 times a day. Remove the ice if your skin turns bright red. This is very important. If you cannot feel pain, heat, or cold, you have a greater risk of damage to the area. If your skin turns red, take off the ice right away to prevent skin damage. The risk of damage is higher if you can't feel pain, heat, or cold. Brushing and flossing Brush your teeth twice a day using a fluoride toothpaste. Use a toothpaste made for  sensitive teeth as told by your dental care provider. Always use a soft toothbrush. This will help prevent irritation of your gums. Floss your teeth at least once a day. General instructions Do not apply heat to the outside of your face. To cleanse your mouth and help with any irritation and swelling in the painful area, you can try rinsing with salt water. Swish and gargle with salt water and then spit it out. To make salt water, add a half to a whole spoonful of salt to a glass of warm water. Mix well. You can repeat this every 2-3 hours for pain. Contact a dental care provider if: You have dental pain and you don't know why. Your pain isn't controlled with medicines. Your symptoms get worse. You have new symptoms. Get help right away if: You can't open your mouth. You're having trouble breathing or swallowing. You have a fever. Your face, neck, or jaw is swollen. These symptoms may be an emergency. Call 911 right away. Do not wait to see if the symptoms will go away. Do not drive yourself to the hospital. This information is not intended to replace advice given to you by your health care provider. Make sure you discuss any questions you have with your health care provider. Document Revised: 12/12/2022 Document Reviewed: 06/29/2022 Elsevier Patient Education  2024 [employer].

## 2023-10-19 ENCOUNTER — Ambulatory Visit: Payer: Self-pay | Admitting: Family

## 2023-10-19 NOTE — Progress Notes (Signed)
 Oncology Nurse Navigator Documentation   Mr. Arteaga PCP has referred him to the cancer center for follow up after his treatment for head and neck cancer again today. I attempted to call him but he did not answer and his voice mail was not set up so I could not leave a message.   As previously documented I have tried multiple times to reach this patient to schedule follow up CT scans and an appointment with his radiation oncologist, Dr. Izell. I will attempt again to reach him on Monday.   Delon Jefferson RN, BSN, OCN Head & Neck Oncology Nurse Navigator [facility] at [facility] Phone # [phone]  Fax # [phone]

## 2023-10-22 ENCOUNTER — Ambulatory Visit: Admitting: Family

## 2023-10-22 NOTE — Progress Notes (Signed)
 Oncology Nurse Navigator Documentation   I attempted to call Mr. Hitchner again today to set up a CT scan and follow up with Dr. Izell, Radiation Oncologist. His phone rang, he did not answer, and his VM was not set up so I was unable to leave a message. I will try again this week.   Delon Jefferson RN, BSN, OCN Head & Neck Oncology Nurse Navigator [facility] at [facility] Phone # [phone]  Fax # [phone]

## 2023-10-25 ENCOUNTER — Other Ambulatory Visit: Payer: Self-pay | Admitting: Family

## 2023-10-25 DIAGNOSIS — G500 Trigeminal neuralgia: Secondary | ICD-10-CM

## 2023-10-29 LAB — CMP14+EGFR
ALT: 13 IU/L (ref 0–44)
AST: 11 IU/L (ref 0–40)
Albumin: 4.2 g/dL (ref 3.9–4.9)
Alkaline Phosphatase: 97 IU/L (ref 44–121)
BUN/Creatinine Ratio: 17 (ref 10–24)
BUN: 13 mg/dL (ref 8–27)
Bilirubin Total: 0.2 mg/dL (ref 0.0–1.2)
CO2: 21 mmol/L (ref 20–29)
Calcium: 9.2 mg/dL (ref 8.6–10.2)
Chloride: 101 mmol/L (ref 96–106)
Creatinine, Ser: 0.76 mg/dL (ref 0.76–1.27)
Globulin, Total: 2.7 g/dL (ref 1.5–4.5)
Glucose: 92 mg/dL (ref 70–99)
Potassium: 4 mmol/L (ref 3.5–5.2)
Sodium: 138 mmol/L (ref 134–144)
Total Protein: 6.9 g/dL (ref 6.0–8.5)
eGFR: 100 mL/min/1.73 (ref >59)

## 2023-10-29 LAB — DRUG SCREEN 10 W/CONF, SERUM
Amphetamines, IA: NEGATIVE ng/mL
Barbiturates, IA: NEGATIVE ug/mL
Benzodiazepines, IA: NEGATIVE ng/mL
Cocaine & Metabolite, IA: NEGATIVE ng/mL
Methadone, IA: NEGATIVE ng/mL
Opiates, IA: NEGATIVE ng/mL
Oxycodones, IA: NEGATIVE ng/mL
Phencyclidine, IA: NEGATIVE ng/mL
Propoxyphene, IA: NEGATIVE ng/mL
THC(Marijuana) Metabolite, IA: POSITIVE ng/mL — AB

## 2023-10-29 LAB — CBC WITH DIFFERENTIAL/PLATELET
Basophils Absolute: 0 x10E3/uL (ref 0.0–0.2)
Basos: 0 %
EOS (ABSOLUTE): 0.2 x10E3/uL (ref 0.0–0.4)
Eos: 3 %
Hematocrit: 46.5 % (ref 37.5–51.0)
Hemoglobin: 15.1 g/dL (ref 13.0–17.7)
Immature Grans (Abs): 0 x10E3/uL (ref 0.0–0.1)
Immature Granulocytes: 0 %
Lymphocytes Absolute: 1.2 x10E3/uL (ref 0.7–3.1)
Lymphs: 17 %
MCH: 29.8 pg (ref 26.6–33.0)
MCHC: 32.5 g/dL (ref 31.5–35.7)
MCV: 92 fL (ref 79–97)
Monocytes Absolute: 0.6 x10E3/uL (ref 0.1–0.9)
Monocytes: 8 %
Neutrophils Absolute: 5.1 x10E3/uL (ref 1.4–7.0)
Neutrophils: 72 %
Platelets: 215 x10E3/uL (ref 150–450)
RBC: 5.07 x10E6/uL (ref 4.14–5.80)
RDW: 12.9 % (ref 11.6–15.4)
WBC: 7.1 x10E3/uL (ref 3.4–10.8)

## 2023-10-29 LAB — THC,MS,WB/SP RFX
Cannabidiol: NEGATIVE
Cannabinoid Confirmation: POSITIVE
Carboxy-THC: 5.6 ng/mL
Hydroxy-THC: NEGATIVE ng/mL
Tetrahydrocannabinol(THC): NEGATIVE ng/mL

## 2023-10-30 NOTE — Progress Notes (Signed)
 Oncology Nurse Navigator Documentation   I've attempted to call Mr. Ergle again to schedule a follow up appointment with Dr. Izell in radiation oncology but the phone just rings and I'm unable to leave a voice mail because it has not been set up. I also left a VM with his son Eva asking him to return my call with my direct contact number in the message. This is my last attempt to call Mr. Mcquain and his son to schedule a CT scan and follow up with us  in the near future.   Delon Jefferson RN, BSN, OCN Head & Neck Oncology Nurse Navigator [facility] at [facility] Phone # [phone]  Fax # [phone]

## 2023-11-04 ENCOUNTER — Other Ambulatory Visit: Payer: Self-pay | Admitting: Family

## 2023-11-04 DIAGNOSIS — J441 Chronic obstructive pulmonary disease with (acute) exacerbation: Secondary | ICD-10-CM

## 2023-11-29 ENCOUNTER — Other Ambulatory Visit: Payer: Self-pay | Admitting: Family

## 2023-12-13 ENCOUNTER — Other Ambulatory Visit: Payer: Self-pay | Admitting: Family

## 2023-12-13 DIAGNOSIS — Z720 Tobacco use: Secondary | ICD-10-CM

## 2023-12-13 DIAGNOSIS — J438 Other emphysema: Secondary | ICD-10-CM

## 2023-12-22 ENCOUNTER — Other Ambulatory Visit: Payer: Self-pay | Admitting: Family

## 2023-12-22 DIAGNOSIS — E785 Hyperlipidemia, unspecified: Secondary | ICD-10-CM

## 2023-12-31 ENCOUNTER — Other Ambulatory Visit: Payer: Self-pay | Admitting: *Deleted

## 2023-12-31 DIAGNOSIS — J441 Chronic obstructive pulmonary disease with (acute) exacerbation: Secondary | ICD-10-CM

## 2024-01-02 ENCOUNTER — Other Ambulatory Visit: Payer: Self-pay | Admitting: Family

## 2024-01-02 DIAGNOSIS — G500 Trigeminal neuralgia: Secondary | ICD-10-CM

## 2024-01-14 ENCOUNTER — Telehealth: Payer: Self-pay | Admitting: Family

## 2024-01-14 DIAGNOSIS — C760 Malignant neoplasm of head, face and neck: Secondary | ICD-10-CM

## 2024-01-14 DIAGNOSIS — Z79899 Other long term (current) drug therapy: Secondary | ICD-10-CM

## 2024-01-14 NOTE — Telephone Encounter (Unsigned)
 Copied from CRM #[id number]. Topic: Clinical - Medication Refill >> Jan 14, 2024 11:18 AM Diannia H wrote: Medication: HYDROcodone -acetaminophen  (NORCO) 7.5-325 MG tablet  Has the patient contacted their pharmacy? Yes (Agent: If no, request that the patient contact the pharmacy for the refill. If patient does not wish to contact the pharmacy document the reason why and proceed with request.) (Agent: If yes, when and what did the pharmacy advise?)  This is the patient's preferred pharmacy:  [phone] [facility] [place] [place] - [address] [place] [place] [address] Phone: [phone] Fax: [phone]  Is this the correct pharmacy for this prescription? Yes If no, delete pharmacy and type the correct one.   Has the prescription been filled recently? Yes  Is the patient out of the medication? Yes  Has the patient been seen for an appointment in the last year OR does the patient have an upcoming appointment? Yes  Can we respond through MyChart? Yes  Agent: Please be advised that Rx refills may take up to 3 business days. We ask that you follow-up with your pharmacy.

## 2024-01-15 ENCOUNTER — Encounter: Payer: Self-pay | Admitting: Family

## 2024-01-15 ENCOUNTER — Telehealth: Payer: Self-pay | Admitting: Family

## 2024-01-15 ENCOUNTER — Telehealth: Admitting: Family

## 2024-01-15 DIAGNOSIS — G8929 Other chronic pain: Secondary | ICD-10-CM

## 2024-01-15 DIAGNOSIS — C760 Malignant neoplasm of head, face and neck: Secondary | ICD-10-CM

## 2024-01-15 DIAGNOSIS — J438 Other emphysema: Secondary | ICD-10-CM

## 2024-01-15 DIAGNOSIS — Z79899 Other long term (current) drug therapy: Secondary | ICD-10-CM

## 2024-01-15 DIAGNOSIS — M150 Primary generalized (osteo)arthritis: Secondary | ICD-10-CM

## 2024-01-15 MED ORDER — HYDROCODONE-ACETAMINOPHEN 7.5-325 MG PO TABS: 1.0000 | ORAL_TABLET | Freq: Three times a day (TID) | ORAL | 0 refills | Status: DC | PRN

## 2024-01-15 NOTE — Telephone Encounter (Signed)
 Called patient he is aware we can not call in pain meds with out an appointment he states he will have to call back

## 2024-01-15 NOTE — Telephone Encounter (Signed)
 Copied from CRM #[id number]. Topic: General - Other >> Jan 15, 2024  9:39 AM Wess RAMAN wrote: Reason for CRM: Patient would like a call from Lavell Lye or her nurse. He did not want to discuss what it was in regards to  [facility] #: [phone]

## 2024-01-15 NOTE — Progress Notes (Signed)
 Virtual Visit Consent   Matthew [name], you are scheduled for a virtual visit with a [facility] provider today. Just as with appointments in the office, your consent must be obtained to participate. Your consent will be active for this visit and any virtual visit you may have with one of our providers in the next 365 days. If you have a MyChart account, a copy of this consent can be sent to you electronically.  As this is a virtual visit, video technology does not allow for your provider to perform a traditional examination. This may limit your provider's ability to fully assess your condition. If your provider identifies any concerns that need to be evaluated in person or the need to arrange testing (such as labs, EKG, etc.), we will make arrangements to do so. Although advances in technology are sophisticated, we cannot ensure that it will always work on either your end or our end. If the connection with a video visit is poor, the visit may have to be switched to a telephone visit. With either a video or telephone visit, we are not always able to ensure that we have a secure connection.  By engaging in this virtual visit, you consent to the provision of healthcare and authorize for your insurance to be billed (if applicable) for the services provided during this visit. Depending on your insurance coverage, you may receive a charge related to this service.  I need to obtain your verbal consent now. Are you willing to proceed with your visit today? Matthew [name] has provided verbal consent on 01/15/2024 for a virtual visit (video or telephone). Bari Learn, FNP  Date: 01/15/2024 2:54 PM   Virtual Visit via Video Note   I, Bari Learn, connected with  Matthew [name]  ([id number], September 08, 1960) on 01/15/24 at  4:45 PM EST by a video-enabled telemedicine application and verified that I am speaking with the correct person using two identifiers.  Location: Patient: Virtual Visit Location Patient:  Home Provider: Virtual Visit Location Provider: Home Office   I discussed the limitations of evaluation and management by telemedicine and the availability of in person appointments. The patient expressed understanding and agreed to proceed.    History of Present Illness: Matthew [name] is a 63 y.o. who identifies as a male who was assigned male at birth, and is being seen today for pain management. Has chronic back and shoulder pain.   He has COPD and continues to smoke a 1 pack a day. Has intermittent SOB that is worse with pollen. Uses Trelegy.   HPI: Arthritis Presents for follow-up visit. He complains of pain and stiffness. Affected locations include the right shoulder, left shoulder, left knee and right knee. His pain is at a severity of 7/10.  Back Pain This is a chronic problem. The current episode started more than 1 year ago. The problem has been waxing and waning since onset. The pain is present in the lumbar spine. The pain is at a severity of 9/10. The pain is moderate.    Problems:  Patient Active Problem List   Diagnosis Date Noted   Head and neck cancer (HCC) 09/28/2021   Cervical lymphadenopathy 09/12/2021   Type 2 diabetes mellitus with diabetic polyneuropathy, without long-term current use of insulin  (HCC) 05/27/2019   PAF (paroxysmal atrial fibrillation) (HCC) 08/28/2018   Unable to read or write 05/31/2018   Canker sores oral    Bradycardia    Hypomagnesemia    Hypokalemia 03/20/2018  Atrial fibrillation with RVR (HCC) 03/20/2018   Hyponatremia 03/20/2018   DDD (degenerative disc disease), lumbar 11/13/2017   Femoroacetabular impingement of both hips 11/13/2017   Late effect of cerebrovascular accident (CVA) 10/25/2017   H/O: CVA (cerebrovascular accident) 10/22/2017   Tobacco use 10/22/2017   Depression 12/25/2016   Abnormal drug screen 11/02/2016   Pain management contract broken 03/09/2016   Coronary artery calcification 01/18/2016   Thoracic aortic  atherosclerosis 01/18/2016   Chronic back pain 08/03/2015   Obesity (BMI 30-39.9) 03/09/2015   Erectile dysfunction 08/24/2014   Vitamin D  deficiency 04/18/2014   Hyperlipidemia 04/18/2014   COPD (chronic obstructive pulmonary disease) (HCC) 04/17/2014   Chronic pain syndrome 08/19/2012   Adjustment disorder with mixed anxiety and depressed mood 05/11/2012   Trigeminal neuralgia 05/11/2012   HTN (hypertension) 05/11/2012    Allergies:  Allergies  Allergen Reactions   Penicillins Other (See Comments)    Did it involve swelling of the face/tongue/throat, SOB, or low BP? Unknown Did it involve sudden or severe rash/hives, skin peeling, or any reaction on the inside of your mouth or nose? Unknown Did you need to seek medical attention at a hospital or doctor's office? Unknown When did it last happen? childhood  If all above answers are "NO", may proceed with cephalosporin use.    Medications:  Current Outpatient Medications:    albuterol  (VENTOLIN  HFA) 108 (90 Base) MCG/ACT inhaler, TAKE 2 PUFFS BY MOUTH EVERY 6 HOURS AS NEEDED FOR WHEEZE OR SHORTNESS OF BREATH, Disp: 8.5 each, Rfl: 0   amLODipine  (NORVASC ) 10 MG tablet, Take 1 tablet (10 mg total) by mouth daily. (NEEDS TO BE SEEN BEFORE NEXT REFILL), Disp: 90 tablet, Rfl: 3   apixaban  (ELIQUIS ) 5 MG TABS tablet, TAKE 1 TABLET BY MOUTH TWICE A DAY, Disp: 180 tablet, Rfl: 2   atorvastatin  (LIPITOR) 40 MG tablet, TAKE 1 TABLET BY MOUTH EVERY DAY, Disp: 90 tablet, Rfl: 0   baclofen  (LIORESAL ) 10 MG tablet, TAKE 1 TABLET BY MOUTH THREE TIMES A DAY AS NEEDED FOR MUSCLE SPASM, Disp: 270 tablet, Rfl: 0   blood glucose meter kit and supplies, Dispense based on patient and insurance preference. Use up to four times daily as directed. (FOR ICD-10 E10.9, E11.9)., Disp: 1 each, Rfl: 0   carvedilol  (COREG ) 3.125 MG tablet, Take 1 tablet (3.125 mg total) by mouth 2 (two) times daily with a meal., Disp: 180 tablet, Rfl: 0   cetirizine  (ZYRTEC ) 10 MG  tablet, TAKE 1 TABLET BY MOUTH EVERY DAY, Disp: 90 tablet, Rfl: 1   DULoxetine  (CYMBALTA ) 60 MG capsule, Take 1 capsule (60 mg total) by mouth daily. (NEEDS TO BE SEEN BEFORE NEXT REFILL), Disp: 90 capsule, Rfl: 3   fluticasone  (FLONASE ) 50 MCG/ACT nasal spray, Place 2 sprays into both nostrils daily., Disp: 48 mL, Rfl: 3   gabapentin  (NEURONTIN ) 800 MG tablet, TAKE 1 TABLET IN THE MORNING, AT NOON, IN THE EVENING, AND AT BEDTIME., Disp: 120 tablet, Rfl: 2   glucose blood test strip, Test BS daily Dx E11.65, Disp: 100 each, Rfl: 3   HYDROcodone -acetaminophen  (NORCO) 7.5-325 MG tablet, Take 1 tablet by mouth every 8 (eight) hours as needed for moderate pain (pain score 4-6)., Disp: 90 tablet, Rfl: 0   HYDROcodone -acetaminophen  (NORCO) 7.5-325 MG tablet, Take 1 tablet by mouth every 8 (eight) hours as needed for moderate pain (pain score 4-6)., Disp: 90 tablet, Rfl: 0   HYDROcodone -acetaminophen  (NORCO) 7.5-325 MG tablet, Take 1 tablet by mouth every 8 (eight) hours  as needed for moderate pain (pain score 4-6)., Disp: 90 tablet, Rfl: 0   lisinopril  (ZESTRIL ) 5 MG tablet, Take 1 tablet (5 mg total) by mouth daily., Disp: 90 tablet, Rfl: 0   loratadine  (CLARITIN ) 10 MG tablet, Take 1 tablet (10 mg total) by mouth daily., Disp: 90 tablet, Rfl: 2   magic mouthwash (nystatin , lidocaine , diphenhydrAMINE, alum & mag hydroxide) suspension, Swish and spit 10 mLs 3 (three) times daily., Disp: 180 mL, Rfl: 1   OXcarbazepine  (TRILEPTAL ) 150 MG tablet, Take 1 tablet (150 mg total) by mouth 2 (two) times daily., Disp: 180 tablet, Rfl: 5   polyethylene glycol powder (GLYCOLAX /MIRALAX ) 17 GM/SCOOP powder, Stir and dissolve 17g of powder in any 4 to 8 ounces of beverage (cold, hot or room temperature), then drink. Take 1-2 times daily to prevent constipation., Disp: 238 g, Rfl: 2   sildenafil  (VIAGRA ) 100 MG tablet, Take 0.5-1 tablets (50-100 mg total) by mouth daily as needed for erectile dysfunction., Disp: 30 tablet,  Rfl: 0   TRELEGY ELLIPTA  100-62.5-25 MCG/ACT AEPB, INHALE 1 PUFF BY MOUTH EVERY DAY, Disp: 60 each, Rfl: 0  Observations/Objective: Patient is well-developed, well-nourished in no acute distress.  Resting comfortably  at home.  Head is normocephalic, atraumatic.  No labored breathing.  Speech is clear and coherent with logical content.  Patient is alert and oriented at baseline.  Coarse non productive cough  Assessment and Plan: 1. Chronic midline back pain, unspecified back location (Primary) - HYDROcodone -acetaminophen  (NORCO) 7.5-325 MG tablet; Take 1 tablet by mouth every 8 (eight) hours as needed for moderate pain (pain score 4-6).  Dispense: 90 tablet; Refill: 0  2. Other emphysema (HCC)  3. Primary osteoarthritis involving multiple joints - HYDROcodone -acetaminophen  (NORCO) 7.5-325 MG tablet; Take 1 tablet by mouth every 8 (eight) hours as needed for moderate pain (pain score 4-6).  Dispense: 90 tablet; Refill: 0  4. Controlled substance agreement signed - HYDROcodone -acetaminophen  (NORCO) 7.5-325 MG tablet; Take 1 tablet by mouth every 8 (eight) hours as needed for moderate pain (pain score 4-6).  Dispense: 90 tablet; Refill: 0  5. Head and neck cancer (HCC) - HYDROcodone -acetaminophen  (NORCO) 7.5-325 MG tablet; Take 1 tablet by mouth every 8 (eight) hours as needed for moderate pain (pain score 4-6).  Dispense: 90 tablet; Refill: 0  Will give one month refill of Norco. Keep appointment 01/22/24 for lab work Encouraged him to call his Oncologists Smoking cessation discussed    Follow Up Instructions: I discussed the assessment and treatment plan with the patient. The patient was provided an opportunity to ask questions and all were answered. The patient agreed with the plan and demonstrated an understanding of the instructions.  A copy of instructions were sent to the patient via MyChart unless otherwise noted below.     The patient was advised to call back or seek an  in-person evaluation if the symptoms worsen or if the condition fails to improve as anticipated.    Bari Learn, FNP

## 2024-01-15 NOTE — Telephone Encounter (Signed)
Pt made

## 2024-01-15 NOTE — Telephone Encounter (Unsigned)
 Copied from CRM #[id number]. Topic: General - Other >> Jan 15, 2024 12:29 PM Tysheama G wrote: Reason for CRM: Patient stated Dr.Hawks nurse and him was supposed to do a video chat. He stated he just spoke with her . Callback number [phone]

## 2024-01-18 ENCOUNTER — Other Ambulatory Visit: Payer: Self-pay | Admitting: Family

## 2024-01-18 DIAGNOSIS — J441 Chronic obstructive pulmonary disease with (acute) exacerbation: Secondary | ICD-10-CM

## 2024-01-22 ENCOUNTER — Ambulatory Visit: Payer: Self-pay | Admitting: Family

## 2024-01-22 ENCOUNTER — Encounter: Payer: Self-pay | Admitting: Family

## 2024-01-22 VITALS — BP 126/70 | HR 67 | Temp 97.8°F | Ht 69.0 in | Wt 216.6 lb

## 2024-01-22 DIAGNOSIS — Z8673 Personal history of transient ischemic attack (TIA), and cerebral infarction without residual deficits: Secondary | ICD-10-CM | POA: Diagnosis not present

## 2024-01-22 DIAGNOSIS — F4323 Adjustment disorder with mixed anxiety and depressed mood: Secondary | ICD-10-CM | POA: Diagnosis not present

## 2024-01-22 DIAGNOSIS — M150 Primary generalized (osteo)arthritis: Secondary | ICD-10-CM

## 2024-01-22 DIAGNOSIS — M5136 Other intervertebral disc degeneration, lumbar region with discogenic back pain only: Secondary | ICD-10-CM | POA: Diagnosis not present

## 2024-01-22 DIAGNOSIS — I6930 Unspecified sequelae of cerebral infarction: Secondary | ICD-10-CM

## 2024-01-22 DIAGNOSIS — F331 Major depressive disorder, recurrent, moderate: Secondary | ICD-10-CM

## 2024-01-22 DIAGNOSIS — J438 Other emphysema: Secondary | ICD-10-CM

## 2024-01-22 DIAGNOSIS — I4891 Unspecified atrial fibrillation: Secondary | ICD-10-CM | POA: Diagnosis not present

## 2024-01-22 DIAGNOSIS — I10 Essential (primary) hypertension: Secondary | ICD-10-CM

## 2024-01-22 DIAGNOSIS — C760 Malignant neoplasm of head, face and neck: Secondary | ICD-10-CM

## 2024-01-22 DIAGNOSIS — E669 Obesity, unspecified: Secondary | ICD-10-CM

## 2024-01-22 DIAGNOSIS — M549 Dorsalgia, unspecified: Secondary | ICD-10-CM | POA: Diagnosis not present

## 2024-01-22 DIAGNOSIS — E1142 Type 2 diabetes mellitus with diabetic polyneuropathy: Secondary | ICD-10-CM | POA: Diagnosis not present

## 2024-01-22 DIAGNOSIS — G8929 Other chronic pain: Secondary | ICD-10-CM

## 2024-01-22 DIAGNOSIS — E785 Hyperlipidemia, unspecified: Secondary | ICD-10-CM

## 2024-01-22 DIAGNOSIS — Z79899 Other long term (current) drug therapy: Secondary | ICD-10-CM

## 2024-01-22 LAB — CBC WITH DIFFERENTIAL/PLATELET
Basophils Absolute: 0.1 x10E3/uL (ref 0.0–0.2)
Basos: 1 %
EOS (ABSOLUTE): 0.2 x10E3/uL (ref 0.0–0.4)
Eos: 3 %
Hematocrit: 43.7 % (ref 37.5–51.0)
Hemoglobin: 13.9 g/dL (ref 13.0–17.7)
Immature Grans (Abs): 0 x10E3/uL (ref 0.0–0.1)
Immature Granulocytes: 0 %
Lymphocytes Absolute: 1.3 x10E3/uL (ref 0.7–3.1)
Lymphs: 15 %
MCH: 28.5 pg (ref 26.6–33.0)
MCHC: 31.8 g/dL (ref 31.5–35.7)
MCV: 90 fL (ref 79–97)
Monocytes Absolute: 0.6 x10E3/uL (ref 0.1–0.9)
Monocytes: 7 %
Neutrophils Absolute: 6.4 x10E3/uL (ref 1.4–7.0)
Neutrophils: 74 %
Platelets: 225 x10E3/uL (ref 150–450)
RBC: 4.88 x10E6/uL (ref 4.14–5.80)
RDW: 13.6 % (ref 11.6–15.4)
WBC: 8.5 x10E3/uL (ref 3.4–10.8)

## 2024-01-22 LAB — CMP14+EGFR
ALT: 11 IU/L (ref 0–44)
AST: 14 IU/L (ref 0–40)
Albumin: 4.3 g/dL (ref 3.9–4.9)
Alkaline Phosphatase: 88 IU/L (ref 47–123)
BUN/Creatinine Ratio: 14 (ref 10–24)
BUN: 11 mg/dL (ref 8–27)
Bilirubin Total: 0.3 mg/dL (ref 0.0–1.2)
CO2: 24 mmol/L (ref 20–29)
Calcium: 9.3 mg/dL (ref 8.6–10.2)
Chloride: 103 mmol/L (ref 96–106)
Creatinine, Ser: 0.77 mg/dL (ref 0.76–1.27)
Globulin, Total: 2.4 g/dL (ref 1.5–4.5)
Glucose: 85 mg/dL (ref 70–99)
Potassium: 4.3 mmol/L (ref 3.5–5.2)
Sodium: 140 mmol/L (ref 134–144)
Total Protein: 6.7 g/dL (ref 6.0–8.5)
eGFR: 100 mL/min/1.73 (ref >59)

## 2024-01-22 LAB — BAYER DCA HB A1C WAIVED: HB A1C (BAYER DCA - WAIVED): 5.5 % (ref 4.8–5.6)

## 2024-01-22 MED ORDER — HYDROCODONE-ACETAMINOPHEN 7.5-325 MG PO TABS: 1.0000 | ORAL_TABLET | Freq: Three times a day (TID) | ORAL | 0 refills | Status: DC | PRN

## 2024-01-22 NOTE — Progress Notes (Signed)
 Subjective:    Patient ID: Matthew [name], male    DOB: [birth year], 63 y.o.   MRN: [id number]  Chief Complaint  Patient presents with   Medical Management of Chronic Issues   PT presents to the office today for chronic follow up and pain management. He has hx of facial trauma and mandible fracture on 04/05/21. He is also being treated for metastatic squamous neck cancer. He completed radiation. He is followed by Oncologists, but states he has not had a ride.   He had PET scan on 03/10/22 that showed, 1. Interval resolution of previously demonstrated hypermetabolic right cervical adenopathy consistent with response to therapy. 2. No evidence of residual hypermetabolic disease in the neck, chest, abdomen or pelvis. 3. Clustered nodularity in the left lower lobe and lingula without associated hypermetabolic activity, likely postinflammatory.    Note from Oncology Navigator on 06/28/23 states, I have made another attempt to call Mr. Spinola to reschedule a post treatment CT neck/chest and follow up with Dr. Izell for results. I was unable to reach Mr. Brizzi due to the phone ringing until a message saying that voice mail was not set, therefore I was unable to leave a message. I also have not received a phone call back from his son after leaving my direct contact information on 06/18/23. This is my final attempt but will be happy to assist if I hear back from Mr. Cleaves or his son.   He has COPD and continues to smoke a 1 pack a day. Has intermittent SOB that is worse with pollen. Uses Trelegy.   He is a diabetic, HTN, COPD, and hx of CVA. He takes Eliquis  daily.    Has trigeminal neuralgia and was followed by Neurologists, but has not seen them recently.    Has thoracic aortic arteriosclerosis and CAD and takes Lipitor daily.  Pt complaining of ED and requesting medications.  Hypertension This is a chronic problem. The current episode started more than 1 year ago. The problem has been waxing and  waning since onset. The problem is uncontrolled. Associated symptoms include blurred vision, malaise/fatigue and shortness of breath. Pertinent negatives include no peripheral edema. Risk factors for coronary artery disease include dyslipidemia, diabetes mellitus, obesity, male gender, sedentary lifestyle and smoking/tobacco exposure. The current treatment provides moderate improvement.  Diabetes He presents for his follow-up diabetic visit. He has type 2 diabetes mellitus. Associated symptoms include blurred vision and foot paresthesias. Symptoms are stable. Diabetic complications include peripheral neuropathy. Risk factors for coronary artery disease include dyslipidemia, diabetes mellitus, hypertension, sedentary lifestyle, post-menopausal, male sex and obesity. He is following a generally unhealthy diet. His overall blood glucose range is 180-200 mg/dl. Eye exam is not current.  Hyperlipidemia This is a chronic problem. The current episode started more than 1 year ago. The problem is controlled. Recent lipid tests were reviewed and are normal. Exacerbating diseases include obesity. Associated symptoms include shortness of breath. Current antihyperlipidemic treatment includes statins. The current treatment provides moderate improvement of lipids. Risk factors for coronary artery disease include dyslipidemia, diabetes mellitus, hypertension, male sex, a sedentary lifestyle and obesity.  Depression        This is a chronic problem.  The current episode started more than 1 year ago.   The problem occurs intermittently.  Associated symptoms include no helplessness, no hopelessness and not sad.  Past treatments include SNRIs - Serotonin and norepinephrine reuptake inhibitors. Back Pain This is a chronic problem. The current episode started more than 1  year ago. The problem occurs intermittently. The problem has been waxing and waning since onset. The pain is present in the lumbar spine. The quality of the pain  is described as aching. The pain is at a severity of 6/10. The pain is moderate. Risk factors include obesity. He has tried analgesics and muscle relaxant for the symptoms. The treatment provided mild relief.  Nicotine  Dependence Presents for follow-up visit. His urge triggers include company of smokers. The symptoms have been stable. He smokes 1 pack of cigarettes per day.     Current opioids rx- Norco 7.5-325 mg # meds rx- 90 Effectiveness of current meds-stable Adverse reactions from pain meds-none Morphine  equivalent- 22.5  Pill count performed-No Last drug screen - 10/18/23 ( high risk q17m, moderate risk q40m, low risk yearly ) Urine drug screen today- Yes Was the [facility] reviewed- yes  If yes were their any concerning findings? - yes, not in urine  Pain contract signed on: 01/22/24   Review of Systems  Constitutional:  Positive for malaise/fatigue.  Eyes:  Positive for blurred vision.  Respiratory:  Positive for shortness of breath.   Musculoskeletal:  Positive for back pain.  All other systems reviewed and are negative.  Family History  Problem Relation Age of Onset   Hypertension Mother    Diabetes Mother    Hypertension Father    Diabetes Father    Cancer Father    Emphysema Father    Early death Brother    Social History   Socioeconomic History   Marital status: Legally Separated    Spouse name: Not on file   Number of children: 3   Years of education: 9   Highest education level: 9th grade  Occupational History   Occupation: Disabled  Tobacco Use   Smoking status: Every Day    Current packs/day: 1.50    Average packs/day: 1.5 packs/day for 48.5 years (72.8 ttl pk-yrs)    Types: Cigarettes    Start date: 07/23/1975   Smokeless tobacco: Never  Vaping Use   Vaping status: Never Used  Substance and Sexual Activity   Alcohol use: Not Currently    Comment: 08/21/2012 quit drinking ~ 1985   Drug use: No   Sexual activity: Not Currently  Other Topics Concern    Not on file  Social History Narrative   Highest level of education: 9th grade   Right handed   Drinks caffeine   [place] cam[place]      Social Drivers of Health   Financial Resource Strain: Low Risk  (03/23/2021)   Overall Financial Resource Strain (CARDIA)    Difficulty of Paying Living Expenses: Not very hard  Food Insecurity: Food Insecurity Present (03/23/2021)   Hunger Vital Sign    Worried About Running Out of Food in the Last Year: Sometimes true    Ran Out of Food in the Last Year: Never true  Transportation Needs: Unmet Transportation Needs (10/04/2021)   PRAPARE - Administrator, Civil Service (Medical): Yes    Lack of Transportation (Non-Medical): No  Physical Activity: Insufficiently Active (03/23/2021)   Exercise Vital Sign    Days of Exercise per Week: 7 days    Minutes of Exercise per Session: 20 min  Stress: No Stress Concern Present (03/23/2021)   [employer] of Occupational Health - Occupational Stress Questionnaire    Feeling of Stress : Only a little  Recent Concern: Stress - Stress Concern Present (03/07/2021)   [employer]  of Occupational Health - Occupational Stress Questionnaire    Feeling of Stress : To some extent  Social Connections: Socially Isolated (03/23/2021)   Social Connection and Isolation Panel    Frequency of Communication with Friends and Family: More than three times a week    Frequency of Social Gatherings with Friends and Family: More than three times a week    Attends Religious Services: Never    Database Administrator or Organizations: No    Attends Banker Meetings: Never    Marital Status: Separated       Objective:   Physical Exam Vitals reviewed.  Constitutional:      General: He is not in acute distress.    Appearance: He is well-developed. He is obese.  HENT:     Head: Normocephalic.     Right Ear: Tympanic membrane normal.     Left Ear: Tympanic membrane normal.      Mouth/Throat:     Comments: No teeth present in lower gum, tenderness in right jaw and swelling  Eyes:     General:        Right eye: No discharge.        Left eye: No discharge.     Pupils: Pupils are equal, round, and reactive to light.  Neck:     Thyroid : No thyromegaly.  Cardiovascular:     Rate and Rhythm: Normal rate and regular rhythm.     Heart sounds: Normal heart sounds. No murmur heard. Pulmonary:     Effort: Pulmonary effort is normal. No respiratory distress.     Breath sounds: Wheezing and rhonchi present.  Abdominal:     General: Bowel sounds are normal. There is no distension.     Palpations: Abdomen is soft.     Tenderness: There is no abdominal tenderness.  Musculoskeletal:        General: Tenderness present.     Cervical back: Normal range of motion and neck supple.     Comments: Pain in lumbar with flexion and extension   Skin:    General: Skin is warm and dry.     Findings: No erythema or rash.     Comments:    Neurological:     Mental Status: He is alert and oriented to person, place, and time.     Cranial Nerves: No cranial nerve deficit.     Deep Tendon Reflexes: Reflexes are normal and symmetric.  Psychiatric:        Behavior: Behavior normal.        Thought Content: Thought content normal.        Judgment: Judgment normal.      BP (!) 161/88   Pulse 67   Temp 97.8 F (36.6 C) (Temporal)   Ht 5' 9 (1.753 m)   Wt 216 lb 9.6 oz (98.2 kg)   SpO2 95%   BMI 31.99 kg/m      Assessment & Plan:   Filemon Breton Churchman comes in today with chief complaint of Medical Management of Chronic Issues   Diagnosis and orders addressed:  1. Adjustment disorder with mixed anxiety and depressed mood - CMP14+EGFR - CBC with Differential/Platelet  2. Atrial fibrillation with RVR (HCC) - CMP14+EGFR - CBC with Differential/Platelet  3. Chronic midline back pain, unspecified back location - ToxASSURE Select 13 (MW), Urine - CMP14+EGFR - CBC with  Differential/Platelet - HYDROcodone -acetaminophen  (NORCO) 7.5-325 MG tablet; Take 1 tablet by mouth every 8 (eight) hours as needed for moderate pain (  pain score 4-6).  Dispense: 90 tablet; Refill: 0 - HYDROcodone -acetaminophen  (NORCO) 7.5-325 MG tablet; Take 1 tablet by mouth every 8 (eight) hours as needed for moderate pain (pain score 4-6).  Dispense: 90 tablet; Refill: 0 - HYDROcodone -acetaminophen  (NORCO) 7.5-325 MG tablet; Take 1 tablet by mouth every 8 (eight) hours as needed for moderate pain (pain score 4-6).  Dispense: 90 tablet; Refill: 0  4. Other emphysema (HCC) - CMP14+EGFR - CBC with Differential/Platelet  5. Degeneration of intervertebral disc of lumbar region with discogenic back pain - CMP14+EGFR - CBC with Differential/Platelet  6. Moderate episode of recurrent major depressive disorder (HCC)  - CMP14+EGFR - CBC with Differential/Platelet  7. H/O: CVA (cerebrovascular accident) - CMP14+EGFR - CBC with Differential/Platelet  8. Primary hypertension - CMP14+EGFR - CBC with Differential/Platelet  9. Hyperlipidemia, unspecified hyperlipidemia type  - CMP14+EGFR - CBC with Differential/Platelet  10. Late effect of cerebrovascular accident (CVA)  - CMP14+EGFR - CBC with Differential/Platelet  11. Obesity (BMI 30-39.9 - CMP14+EGFR - CBC with Differential/Platelet  12. Type 2 diabetes mellitus with diabetic polyneuropathy, without long-term current use of insulin  (HCC) (Primary) - Bayer DCA Hb A1c Waived - CMP14+EGFR - CBC with Differential/Platelet - Microalbumin / creatinine urine ratio  13. Head and neck cancer (HCC) - HYDROcodone -acetaminophen  (NORCO) 7.5-325 MG tablet; Take 1 tablet by mouth every 8 (eight) hours as needed for moderate pain (pain score 4-6).  Dispense: 90 tablet; Refill: 0 - HYDROcodone -acetaminophen  (NORCO) 7.5-325 MG tablet; Take 1 tablet by mouth every 8 (eight) hours as needed for moderate pain (pain score 4-6).  Dispense: 90  tablet; Refill: 0 - HYDROcodone -acetaminophen  (NORCO) 7.5-325 MG tablet; Take 1 tablet by mouth every 8 (eight) hours as needed for moderate pain (pain score 4-6).  Dispense: 90 tablet; Refill: 0 - Ambulatory referral to Hematology / Oncology  14. Controlled substance agreement signed - HYDROcodone -acetaminophen  (NORCO) 7.5-325 MG tablet; Take 1 tablet by mouth every 8 (eight) hours as needed for moderate pain (pain score 4-6).  Dispense: 90 tablet; Refill: 0 - HYDROcodone -acetaminophen  (NORCO) 7.5-325 MG tablet; Take 1 tablet by mouth every 8 (eight) hours as needed for moderate pain (pain score 4-6).  Dispense: 90 tablet; Refill: 0 - HYDROcodone -acetaminophen  (NORCO) 7.5-325 MG tablet; Take 1 tablet by mouth every 8 (eight) hours as needed for moderate pain (pain score 4-6).  Dispense: 90 tablet; Refill: 0  15. Primary osteoarthritis involving multiple joints - HYDROcodone -acetaminophen  (NORCO) 7.5-325 MG tablet; Take 1 tablet by mouth every 8 (eight) hours as needed for moderate pain (pain score 4-6).  Dispense: 90 tablet; Refill: 0  Referral to Oncologists in [place]. Pt states he can drive to this location. Discussed in length the importance of follow up with Oncologists.  Labs pending Patient reviewed in [place] controlled database, no flags noted. Contract and drug screen up dated today.  Health Maintenance reviewed Diet and exercise encouraged   Follow up plan: 2 weeks    Bari Learn, FNP

## 2024-01-22 NOTE — Patient Instructions (Signed)

## 2024-01-24 ENCOUNTER — Ambulatory Visit: Payer: Self-pay | Admitting: Family

## 2024-01-26 LAB — TOXASSURE SELECT 13 (MW), URINE

## 2024-01-29 ENCOUNTER — Other Ambulatory Visit: Payer: Self-pay | Admitting: Family

## 2024-01-29 DIAGNOSIS — G500 Trigeminal neuralgia: Secondary | ICD-10-CM

## 2024-02-05 ENCOUNTER — Ambulatory Visit: Admitting: Family

## 2024-02-05 ENCOUNTER — Other Ambulatory Visit: Payer: Self-pay | Admitting: Family

## 2024-02-05 ENCOUNTER — Encounter: Payer: Self-pay | Admitting: Family

## 2024-02-05 VITALS — BP 136/71 | HR 78 | Temp 98.0°F | Ht 69.0 in | Wt 211.2 lb

## 2024-02-05 DIAGNOSIS — C760 Malignant neoplasm of head, face and neck: Secondary | ICD-10-CM

## 2024-02-05 DIAGNOSIS — J441 Chronic obstructive pulmonary disease with (acute) exacerbation: Secondary | ICD-10-CM

## 2024-02-05 DIAGNOSIS — Z91148 Patient's other noncompliance with medication regimen for other reason: Secondary | ICD-10-CM

## 2024-02-05 MED ORDER — PREDNISONE 20 MG PO TABS: 40.0000 mg | ORAL_TABLET | Freq: Every day | ORAL | 0 refills | Status: AC

## 2024-02-05 NOTE — Progress Notes (Signed)
 "  Subjective:    Patient ID: Matthew [name], male    DOB: Feb 17, 1960, 63 y.o.   MRN: [id number]  Chief Complaint  Patient presents with   Follow-up   PT presents to the office today to discuss referral. He is unable to read or write and has not followed his Oncologists in over a year. I placed a referral to [facility] so he can drive himself. Unfortunately, this was sent to the wrong visit.    His drug screen was also abnormal with THC and his pain medication was not in his urine. We discussed today that I can no longer prescribe that for him.  Cough This is a recurrent problem. The current episode started more than 1 month ago. The problem has been unchanged. The problem occurs every few minutes. The cough is Non-productive. Pertinent negatives include no ear congestion, ear pain, fever, headaches, nasal congestion or postnasal drip. Risk factors for lung disease include smoking/tobacco exposure. He has tried rest for the symptoms.      Review of Systems  Constitutional:  Negative for fever.  HENT:  Negative for ear pain and postnasal drip.   Respiratory:  Positive for cough.   Neurological:  Negative for headaches.  All other systems reviewed and are negative.   Social History   Socioeconomic History   Marital status: Legally Separated    Spouse name: Not on file   Number of children: 3   Years of education: 9   Highest education level: 9th grade  Occupational History   Occupation: Disabled  Tobacco Use   Smoking status: Every Day    Current packs/day: 1.50    Average packs/day: 1.5 packs/day for 48.5 years (72.8 ttl pk-yrs)    Types: Cigarettes    Start date: 07/23/1975   Smokeless tobacco: Never  Vaping Use   Vaping status: Never Used  Substance and Sexual Activity   Alcohol use: Not Currently    Comment: 08/21/2012 quit drinking ~ 1985   Drug use: No   Sexual activity: Not Currently  Other Topics Concern   Not on file  Social History Narrative   Highest level of  education: 9th grade   Right handed   Drinks caffeine   [place]      Social Drivers of Health   Tobacco Use: High Risk (02/05/2024)   Patient History    Smoking Tobacco Use: Every Day    Smokeless Tobacco Use: Never    Passive Exposure: Not on file  Financial Resource Strain: Low Risk (03/23/2021)   Overall Financial Resource Strain (CARDIA)    Difficulty of Paying Living Expenses: Not very hard  Food Insecurity: Food Insecurity Present (03/23/2021)   Hunger Vital Sign    Worried About Running Out of Food in the Last Year: Sometimes true    Ran Out of Food in the Last Year: Never true  Transportation Needs: Unmet Transportation Needs (10/04/2021)   PRAPARE - Administrator, Civil Service (Medical): Yes    Lack of Transportation (Non-Medical): No  Physical Activity: Insufficiently Active (03/23/2021)   Exercise Vital Sign    Days of Exercise per Week: 7 days    Minutes of Exercise per Session: 20 min  Stress: No Stress Concern Present (03/23/2021)   [employer] of Occupational Health - Occupational Stress Questionnaire    Feeling of Stress : Only a little  Recent Concern: Stress - Stress Concern Present (03/07/2021)   [employer] of Occupational  Health - Occupational Stress Questionnaire    Feeling of Stress : To some extent  Social Connections: Socially Isolated (03/23/2021)   Social Connection and Isolation Panel    Frequency of Communication with Friends and Family: More than three times a week    Frequency of Social Gatherings with Friends and Family: More than three times a week    Attends Religious Services: Never    Database Administrator or Organizations: No    Attends Banker Meetings: Never    Marital Status: Separated  Depression (PHQ2-9): Low Risk (02/05/2024)   Depression (PHQ2-9)    PHQ-2 Score: 0  Alcohol Screen: Low Risk (03/23/2021)   Alcohol Screen    Last Alcohol Screening Score (AUDIT): 0  Housing: Low  Risk (03/23/2021)   Housing    Last Housing Risk Score: 0  Utilities: Not on file  Health Literacy: Not on file   Family History  Problem Relation Age of Onset   Hypertension Mother    Diabetes Mother    Hypertension Father    Diabetes Father    Cancer Father    Emphysema Father    Early death Brother         Objective:   Physical Exam Vitals reviewed.  Constitutional:      General: He is not in acute distress.    Appearance: He is well-developed.  HENT:     Head: Normocephalic.  Eyes:     General:        Right eye: No discharge.        Left eye: No discharge.     Pupils: Pupils are equal, round, and reactive to light.  Neck:     Thyroid : No thyromegaly.  Cardiovascular:     Rate and Rhythm: Normal rate and regular rhythm.     Heart sounds: Normal heart sounds. No murmur heard. Pulmonary:     Effort: Pulmonary effort is normal. No respiratory distress.     Breath sounds: Wheezing and rhonchi present.  Abdominal:     General: Bowel sounds are normal. There is no distension.     Palpations: Abdomen is soft.     Tenderness: There is no abdominal tenderness.  Musculoskeletal:        General: No tenderness. Normal range of motion.     Cervical back: Normal range of motion and neck supple.  Skin:    General: Skin is warm and dry.     Findings: No erythema or rash.  Neurological:     Mental Status: He is alert and oriented to person, place, and time.     Cranial Nerves: No cranial nerve deficit.     Deep Tendon Reflexes: Reflexes are normal and symmetric.  Psychiatric:        Behavior: Behavior normal.        Thought Content: Thought content normal.        Judgment: Judgment normal.       BP 136/71   Pulse 78   Temp 98 F (36.7 C) (Temporal)   Ht 5' 9 (1.753 m)   Wt 211 lb 3.2 oz (95.8 kg)   SpO2 91%   BMI 31.19 kg/m      Assessment & Plan:  Djuan Talton Bertelson comes in today with chief complaint of Follow-up   Diagnosis and orders addressed:  1. Head  and neck cancer (HCC) (Primary) Referral to [facility] Oncologists.  Follow up in 2 weeks for me to help with scheduling  - Ambulatory  referral to Hematology / Oncology  2. Pain management contract broken Discussed in length that I can no longer prescribe his controlled medications.    3. COPD exacerbation (HCC) - predniSONE  (DELTASONE ) 20 MG tablet; Take 2 tablets (40 mg total) by mouth daily with breakfast for 5 days. 2 po daily for 5 days  Dispense: 10 tablet; Refill: 0    Referral pending  Prednisone  Prescription sent to pharmacy  Continue current medications  Keep follow up with specialists  Health Maintenance reviewed Diet and exercise encouraged  Return if symptoms worsen or fail to improve.    Bari Learn, FNP   "

## 2024-02-05 NOTE — Patient Instructions (Signed)
 Chronic Obstructive Pulmonary Disease Exacerbation  Chronic obstructive pulmonary disease (COPD) is a long-term (chronic) lung problem. When you have COPD, it can feel harder to breathe in or out. COPD exacerbation is a flare-up of symptoms when breathing gets worse and more treatment may be needed. Without treatment, flare-ups can be life-threatening. If they happen often, your lungs can become more damaged. What are the causes? Not taking your usual COPD medicines as told by your health care provider. A cold or the flu, which can cause infection in your lungs. Being exposed to things that make your breathing worse, such as: Smoke. Air pollution. Fumes. Dust. Allergies. Weather changes. What are the signs or symptoms? Symptoms do not get better or get worse even if you take your medicines as told by your provider. Symptoms may include: More shortness of breath. You may only be able to speak one or two words at a time. More coughing or mucus from your lungs. More wheezing or chest tightness. Being more tired and having less energy. Confusion. How is this diagnosed? This condition is diagnosed based on: Symptoms that get worse. Your medical history. A physical exam. You may also have tests, including: A chest X-ray. Blood or mucus tests. How is this treated? You may be able to stay home or you may need to go to the hospital. Treatment may include: Taking medicines. These may include: Inhalers. These have medicines in them that you breathe in. These may be more of what you already take or they may be new. Steroids. These reduce inflammation in the airways. These may be inhaled, taken by mouth, or given in an IV. Antibiotics. These treat infection. Using oxygen. Using a device to help you clear mucus. Follow these instructions at home: Medicines Take your medicines only as told by your provider. If you were given antibiotics or steroids, take them as told by your provider. Do  not stop taking them even if you start to feel better. Lifestyle Several times a day, wash your hands with soap and water for at least 20 seconds. If you cannot use soap and water, use hand sanitizer. This may help keep you from getting an infection. Avoid being around crowds or people who are sick. Do not smoke or use any products that contain nicotine or tobacco. If you need help quitting, ask your provider. Return to your normal activities when your provider says that it's safe. Use breathing methods to control your stress and catch your breath. How is this prevented? Follow your COPD action plan. The action plan tells you what to do if you're feeling good and what to do when you start feeling worse. Discuss the plan often with your provider. Make sure you get all the shots, also called vaccines, that your provider recommends. Ask your provider about a flu shot and a pneumonia shot. Use oxygen therapy if told by your provider. If you need home oxygen therapy, ask your provider how often to check your oxygen level with a device called an oximeter. Keep all follow-up visits to review your COPD action plan. Your provider will want to check on your condition often to keep you healthy and out of the hospital. Contact a health care provider if: Your COPD symptoms get worse. You have a fever or chills. You have trouble doing daily activities. You have trouble breathing even when you are resting. Get help right away if: You are short of breath and cannot: Talk in full sentences. Do normal activities. You have chest  pain. You feel confused. These symptoms may be an emergency. Call 911 right away. Do not wait to see if the symptoms will go away. Do not drive yourself to the hospital. This information is not intended to replace advice given to you by your health care provider. Make sure you discuss any questions you have with your health care provider. Document Revised: 11/02/2022 Document  Reviewed: 04/17/2022 Elsevier Patient Education  2024 [employer].

## 2024-02-10 ENCOUNTER — Other Ambulatory Visit: Payer: Self-pay | Admitting: Family

## 2024-02-11 NOTE — Progress Notes (Signed)
 Referral received for patient to [facility]. Attempted to call patient x3 to encourage patient to follow-up with RadOnc at [facility] but was unable to reach the patient directly and patient has no VM box available. Patient did return my call and when I identified that I was calling from [facility], he abruptly ended the phone call. Attempted to call patient back following that call with no success. Referral message sent to referring provider regarding the above and reminding her that [facility] does not offer Radiation Oncology. Referral will be closed at this time.

## 2024-02-12 ENCOUNTER — Other Ambulatory Visit: Payer: Self-pay | Admitting: Family

## 2024-02-12 NOTE — Telephone Encounter (Unsigned)
 Copied from CRM [id number]. Topic: Clinical - Medication Refill >> Feb 12, 2024  9:43 AM [name] wrote: Medication: TRELEGY ELLIPTA  100-62.5-25 MCG/ACT AEPB  Has the patient contacted their pharmacy? Yes (Agent: If no, request that the patient contact the pharmacy for the refill. If patient does not wish to contact the pharmacy document the reason why and proceed with request.) (Agent: If yes, when and what did the pharmacy advise?)  This is the patient's preferred pharmacy:  [id number] [place] [place] - [address] [place] [place] [address] Phone: [phone] Fax: [phone]  Is this the correct pharmacy for this prescription? Yes If no, delete pharmacy and type the correct one.   Has the prescription been filled recently? No  Is the patient out of the medication? Yes  Has the patient been seen for an appointment in the last year OR does the patient have an upcoming appointment? Yes  Can we respond through MyChart? No  Agent: Please be advised that Rx refills may take up to 3 business days. We ask that you follow-up with your pharmacy.

## 2024-02-18 ENCOUNTER — Other Ambulatory Visit: Payer: Self-pay | Admitting: Family

## 2024-02-18 ENCOUNTER — Telehealth: Payer: Self-pay | Admitting: *Deleted

## 2024-02-18 NOTE — Telephone Encounter (Unsigned)
 Copied from CRM [id number]. Topic: Clinical - Medication Question >> Feb 18, 2024  2:52 PM Mercer PEDLAR wrote: Reason for CRM: Patient would like a callback to speak with Ms. Christy or her nurse.

## 2024-02-18 NOTE — Telephone Encounter (Signed)
 Copied from CRM [id number]. Topic: Clinical - Medication Question >> Feb 18, 2024  2:52 PM Mercer PEDLAR wrote: Reason for CRM: Patient would like a callback to speak with Ms. Christy or her nurse.

## 2024-02-18 NOTE — Telephone Encounter (Unsigned)
 Copied from CRM #[id number]. Topic: Clinical - Medication Refill >> Feb 18, 2024  2:50 PM Mercer PEDLAR wrote: Medication: glucose blood test strip  Has the patient contacted their pharmacy? Yes (Agent: If no, request that the patient contact the pharmacy for the refill. If patient does not wish to contact the pharmacy document the reason why and proceed with request.) (Agent: If yes, when and what did the pharmacy advise?)  This is the patient's preferred pharmacy:  [id number] [place] [place] - [address] [place] [address] Phone: [phone] Fax: [phone]  Is this the correct pharmacy for this prescription? Yes If no, delete pharmacy and type the correct one.   Has the prescription been filled recently? No  Is the patient out of the medication? Yes  Has the patient been seen for an appointment in the last year OR does the patient have an upcoming appointment? Yes  Can we respond through MyChart? No  Agent: Please be advised that Rx refills may take up to 3 business days. We ask that you follow-up with your pharmacy.

## 2024-02-19 MED ORDER — GLUCOSE BLOOD VI STRP: ORAL_STRIP | 3 refills | Status: AC

## 2024-02-22 ENCOUNTER — Ambulatory Visit: Admitting: Family

## 2024-02-22 ENCOUNTER — Encounter: Payer: Self-pay | Admitting: Family

## 2024-02-22 VITALS — BP 132/72 | HR 95 | Temp 98.5°F | Ht 69.0 in | Wt 212.8 lb

## 2024-02-22 DIAGNOSIS — E1142 Type 2 diabetes mellitus with diabetic polyneuropathy: Secondary | ICD-10-CM | POA: Diagnosis not present

## 2024-02-22 DIAGNOSIS — B9689 Other specified bacterial agents as the cause of diseases classified elsewhere: Secondary | ICD-10-CM

## 2024-02-22 DIAGNOSIS — C760 Malignant neoplasm of head, face and neck: Secondary | ICD-10-CM | POA: Diagnosis not present

## 2024-02-22 DIAGNOSIS — J438 Other emphysema: Secondary | ICD-10-CM

## 2024-02-22 DIAGNOSIS — Z122 Encounter for screening for malignant neoplasm of respiratory organs: Secondary | ICD-10-CM | POA: Diagnosis not present

## 2024-02-22 DIAGNOSIS — J208 Acute bronchitis due to other specified organisms: Secondary | ICD-10-CM

## 2024-02-22 MED ORDER — AZITHROMYCIN 250 MG PO TABS: ORAL_TABLET | ORAL | 0 refills | Status: AC

## 2024-02-22 NOTE — Patient Instructions (Signed)

## 2024-02-22 NOTE — Progress Notes (Signed)
 "  Subjective:    Patient ID: Matthew [name], male    DOB: [birth year], 64 y.o.   MRN: [id number]  Chief Complaint  Patient presents with   Medical Management of Chronic Issues   PT presents to the office today to discuss referral. He is unable to read or write and has not followed his Oncologists in over a year because he states he can not drive in [place].   I placed a referral to [facility] so he can drive himself. Unfortunately, they have closed the referral because he would not answer the phone. They sent me a message stating he needed to follow up with radiation oncology. This is not offered at [facility] and can only be [facility] or [facility].  Cough This is a chronic problem. The current episode started more than 1 year ago. The problem has been rapidly improving. The problem occurs every few minutes. The cough is Productive of purulent sputum. Associated symptoms include nasal congestion, postnasal drip, a sore throat and shortness of breath (every once in awhile). Pertinent negatives include no ear congestion, ear pain, fever or headaches. Risk factors for lung disease include smoking/tobacco exposure. He has tried rest for the symptoms. The treatment provided mild relief. His past medical history is significant for COPD.  Diabetes He has type 2 diabetes mellitus. Pertinent negatives for hypoglycemia include no headaches. Associated symptoms include foot paresthesias. Pertinent negatives for diabetes include no blurred vision. Diabetic complications include peripheral neuropathy. Risk factors for coronary artery disease include male sex, obesity, hypertension, dyslipidemia, family history and diabetes mellitus. He is following a generally healthy diet. His overall blood glucose range is 90-110 mg/dl.      Review of Systems  Constitutional:  Negative for fever.  HENT:  Positive for postnasal drip and sore throat. Negative for ear pain.   Eyes:  Negative for blurred vision.   Respiratory:  Positive for cough and shortness of breath (every once in awhile).   Neurological:  Negative for headaches.  All other systems reviewed and are negative.   Social History   Socioeconomic History   Marital status: Legally Separated    Spouse name: Not on file   Number of children: 3   Years of education: 9   Highest education level: 9th grade  Occupational History   Occupation: Disabled  Tobacco Use   Smoking status: Every Day    Current packs/day: 1.50    Average packs/day: 1.5 packs/day for 48.6 years (72.9 ttl pk-yrs)    Types: Cigarettes    Start date: 07/23/1975   Smokeless tobacco: Never  Vaping Use   Vaping status: Never Used  Substance and Sexual Activity   Alcohol use: Not Currently    Comment: 08/21/2012 quit drinking ~ 1985   Drug use: No   Sexual activity: Not Currently  Other Topics Concern   Not on file  Social History Narrative   Highest level of education: 9th grade   Right handed   Drinks caffeine   [place]      Social Drivers of Health   Tobacco Use: High Risk (02/22/2024)   Patient History    Smoking Tobacco Use: Every Day    Smokeless Tobacco Use: Never    Passive Exposure: Not on file  Financial Resource Strain: Low Risk (03/23/2021)   Overall Financial Resource Strain (CARDIA)    Difficulty of Paying Living Expenses: Not very hard  Food Insecurity: Food Insecurity Present (03/23/2021)  Hunger Vital Sign    Worried About Running Out of Food in the Last Year: Sometimes true    Ran Out of Food in the Last Year: Never true  Transportation Needs: Unmet Transportation Needs (10/04/2021)   PRAPARE - Administrator, Civil Service (Medical): Yes    Lack of Transportation (Non-Medical): No  Physical Activity: Insufficiently Active (03/23/2021)   Exercise Vital Sign    Days of Exercise per Week: 7 days    Minutes of Exercise per Session: 20 min  Stress: No Stress Concern Present (03/23/2021)   [employer] of Occupational Health - Occupational Stress Questionnaire    Feeling of Stress : Only a little  Recent Concern: Stress - Stress Concern Present (03/07/2021)   [employer] of Occupational Health - Occupational Stress Questionnaire    Feeling of Stress : To some extent  Social Connections: Socially Isolated (03/23/2021)   Social Connection and Isolation Panel    Frequency of Communication with Friends and Family: More than three times a week    Frequency of Social Gatherings with Friends and Family: More than three times a week    Attends Religious Services: Never    Database Administrator or Organizations: No    Attends Banker Meetings: Never    Marital Status: Separated  Depression (PHQ2-9): Low Risk (02/05/2024)   Depression (PHQ2-9)    PHQ-2 Score: 0  Alcohol Screen: Low Risk (03/23/2021)   Alcohol Screen    Last Alcohol Screening Score (AUDIT): 0  Housing: Low Risk (03/23/2021)   Housing    Last Housing Risk Score: 0  Utilities: Not on file  Health Literacy: Not on file   Family History  Problem Relation Age of Onset   Hypertension Mother    Diabetes Mother    Hypertension Father    Diabetes Father    Cancer Father    Emphysema Father    Early death Brother         Objective:   Physical Exam Vitals reviewed.  Constitutional:      General: He is not in acute distress.    Appearance: He is well-developed.  HENT:     Head: Normocephalic.     Mouth/Throat:     Pharynx: Posterior oropharyngeal erythema present.  Eyes:     General:        Right eye: No discharge.        Left eye: No discharge.     Pupils: Pupils are equal, round, and reactive to light.  Neck:     Thyroid : No thyromegaly.  Cardiovascular:     Rate and Rhythm: Normal rate and regular rhythm.     Heart sounds: Normal heart sounds. No murmur heard. Pulmonary:     Effort: Pulmonary effort is normal. No respiratory distress.     Breath sounds: Rhonchi present. No wheezing.      Comments: Coarse nonproductive cough Abdominal:     General: Bowel sounds are normal. There is no distension.     Palpations: Abdomen is soft.     Tenderness: There is no abdominal tenderness.  Musculoskeletal:        General: No tenderness. Normal range of motion.     Cervical back: Normal range of motion and neck supple.  Skin:    General: Skin is warm and dry.     Findings: No erythema or rash.  Neurological:     Mental Status: He is alert and oriented to person, place, and time.  Cranial Nerves: No cranial nerve deficit.     Deep Tendon Reflexes: Reflexes are normal and symmetric.  Psychiatric:        Behavior: Behavior normal.        Thought Content: Thought content normal.        Judgment: Judgment normal.       BP 132/72   Pulse 95   Temp 98.5 F (36.9 C) (Oral)   Ht 5' 9 (1.753 m)   Wt 212 lb 12.8 oz (96.5 kg)   SpO2 95%   BMI 31.43 kg/m      Assessment & Plan:  Stefanos Haynesworth Nealy comes in today with chief complaint of Medical Management of Chronic Issues   Diagnosis and orders addressed:  1. Head and neck cancer (HCC) (Primary) CT neck pending Referral to Oncologists  - Ambulatory referral to Radiation Oncology - CT Soft Tissue Neck W Contrast; Future - CMP14+EGFR  2. Other emphysema (HCC) - CMP14+EGFR - Ambulatory Referral Lung Cancer Screening [facility]  3. Type 2 diabetes mellitus with diabetic polyneuropathy, without long-term current use of insulin  (HCC) - CMP14+EGFR - Microalbumin / creatinine urine ratio  4. Acute bacterial bronchitis - Take meds as prescribed - Use a cool mist humidifier  -Use saline nose sprays frequently -Force fluids -For any cough or congestion  Use plain Mucinex - regular strength or max strength is fine -For fever or aces or pains- take tylenol  or ibuprofen. -Throat lozenges if help -New toothbrush in 3 days - CMP14+EGFR - azithromycin  (ZITHROMAX ) 250 MG tablet; Take 500 mg once, then 250 mg for four  days  Dispense: 6 tablet; Refill: 0  5. Encounter for screening for lung cancer - Ambulatory Referral Lung Cancer Screening [facility]   Labs pending Continue current medications  CT pending  Referral to Oncologists pending Health Maintenance reviewed Diet and exercise encouraged  Return in about 1 month (around 03/24/2024), or if symptoms worsen or fail to improve.    Bari Learn, FNP   "

## 2024-02-23 ENCOUNTER — Other Ambulatory Visit: Payer: Self-pay | Admitting: Family

## 2024-02-23 DIAGNOSIS — I4891 Unspecified atrial fibrillation: Secondary | ICD-10-CM

## 2024-02-23 LAB — CMP14+EGFR
ALT: 16 IU/L (ref 0–44)
AST: 15 IU/L (ref 0–40)
Albumin: 4.4 g/dL (ref 3.9–4.9)
Alkaline Phosphatase: 99 IU/L (ref 47–123)
BUN/Creatinine Ratio: 17 (ref 10–24)
BUN: 12 mg/dL (ref 8–27)
Bilirubin Total: 0.3 mg/dL (ref 0.0–1.2)
CO2: 22 mmol/L (ref 20–29)
Calcium: 9.2 mg/dL (ref 8.6–10.2)
Chloride: 103 mmol/L (ref 96–106)
Creatinine, Ser: 0.7 mg/dL — ABNORMAL LOW (ref 0.76–1.27)
Globulin, Total: 2.7 g/dL (ref 1.5–4.5)
Glucose: 94 mg/dL (ref 70–99)
Potassium: 4.3 mmol/L (ref 3.5–5.2)
Sodium: 142 mmol/L (ref 134–144)
Total Protein: 7.1 g/dL (ref 6.0–8.5)
eGFR: 103 mL/min/1.73

## 2024-02-23 LAB — MICROALBUMIN / CREATININE URINE RATIO
Creatinine, Urine: 16 mg/dL
Microalb/Creat Ratio: 34 mg/g{creat} — ABNORMAL HIGH (ref 0–29)
Microalbumin, Urine: 5.4 ug/mL

## 2024-02-25 ENCOUNTER — Ambulatory Visit: Payer: Self-pay | Admitting: Family

## 2024-02-25 NOTE — Telephone Encounter (Signed)
 Attempted to contact pt to f/u but NA. Unable to LM as VM has not been set up. Attempt #1    Message from [facility] sent at 02/25/2024  1:53 PM EST  Summary: still experiencing congestion and stuffiness.   Reason for Triage: patient Matthew [name] calling stated he is still experiencing congestion and stuffiness. He stated he would like another round of antibiotics. (Does not know name of meds)  Please adviswe          Azithromycin  250mg , Take 500 mg once, then 250 mg for four days

## 2024-02-25 NOTE — Telephone Encounter (Signed)
 One dose of azithromycin  left, states cough is improving. Has not taken mucinex , states not able to pay for it. Is there an alternate that can be prescribed in it's place as pt states his insurance covers prescriptions with 0 copay.   FYI Only or Action Required?: Action required by provider: clinical question for provider.  Patient was last seen in primary care on 02/22/2024 by Lavell Bari LABOR, FNP.  Called Nurse Triage reporting Cough.  Symptoms began several days ago.  Interventions attempted: Prescription medications: azithromycin .  Symptoms are: gradually improving.  Triage Disposition: Discuss With PCP and Callback by Nurse Today (overriding See PCP When Office is Open (Within 3 Days))  Patient/caregiver understands and will follow disposition?: Yes Reason for Disposition  [1] Finished taking antibiotics AND [2] symptoms are BETTER but [3] not completely gone  Answer Assessment - Initial Assessment Questions Reviewed expected course of cough post antibiotic tx for bronchitis. Advised to call back if cough begins to worsen or if he develops SOB, wheezing, fever.  1. INFECTION: What infection is the antibiotic being given for?     Bronchitis 2. ANTIBIOTIC: What antibiotic are you taking How many times per day?     Azithromycin  3. DURATION: When was the antibiotic started?     02/22/24 4. MAIN CONCERN OR SYMPTOM:  What is your main concern right now?     Pt believes he needs a longer course of antibiotics 5. BETTER-SAME-WORSE: Are you getting better, staying the same, or getting worse compared to when you first started the antibiotics? If getting worse, ask: In what way?      Improving 6. FEVER: Do you have a fever? If Yes, ask: What is your temperature, how was it measured, and when did it start?     Denies 7. SYMPTOMS: Are there any other symptoms you're concerned about? If Yes, ask: When did it start?     Congestion  Protocols used: Infection on  Antibiotic Follow-up Call-A-AH

## 2024-02-26 MED ORDER — GUAIFENESIN ER 600 MG PO TB12: 1200.0000 mg | ORAL_TABLET | Freq: Two times a day (BID) | ORAL | 2 refills | Status: DC

## 2024-02-26 NOTE — Telephone Encounter (Signed)
 Patient aware and verbalizes understanding.

## 2024-02-26 NOTE — Telephone Encounter (Signed)
 Mucinex  Prescription sent to pharmacy to see if they will pay for rx. Rest, force fluids.

## 2024-02-27 ENCOUNTER — Telehealth: Payer: Self-pay | Admitting: Family

## 2024-02-27 MED ORDER — GUAIFENESIN ER 600 MG PO TB12: 1200.0000 mg | ORAL_TABLET | Freq: Two times a day (BID) | ORAL | 2 refills | Status: AC

## 2024-02-27 NOTE — Telephone Encounter (Signed)
 Copied from CRM [id number]. Topic: Clinical - Prescription Issue >> Feb 27, 2024  4:33 PM DeAngela L wrote: Reason for CRM: the patient son calling after speaking with [facility] and they had a computer failure yesterday and they are asking can the office send the prescription for  guaiFENesin  (MUCINEX ) 600 MG 12 hr tablet over again   [id number] - [place], [place] - [address] [place] [address] Phone: [phone] Fax: [phone]

## 2024-02-28 ENCOUNTER — Telehealth: Payer: Self-pay | Admitting: Family

## 2024-02-28 DIAGNOSIS — C760 Malignant neoplasm of head, face and neck: Secondary | ICD-10-CM

## 2024-02-28 NOTE — Telephone Encounter (Signed)
 Referral placed to [facility]

## 2024-02-28 NOTE — Telephone Encounter (Signed)
 Copied from CRM #[id number]. Topic: Referral - Status >> Feb 28, 2024 10:52 AM Diannia H wrote: Reason for CRM: Midge is calling about referral [id number] and she is going to close it out due to the referral stating the patient can not drive to [place] and that's where the referral was sent and it was suppose to go to [facility].   Luda callback is [phone] option 2 and ask to speak to Luda.

## 2024-03-04 ENCOUNTER — Other Ambulatory Visit: Payer: Self-pay | Admitting: Family

## 2024-03-04 DIAGNOSIS — J441 Chronic obstructive pulmonary disease with (acute) exacerbation: Secondary | ICD-10-CM

## 2024-03-06 ENCOUNTER — Other Ambulatory Visit: Payer: Self-pay | Admitting: Family

## 2024-03-06 DIAGNOSIS — B9689 Other specified bacterial agents as the cause of diseases classified elsewhere: Secondary | ICD-10-CM

## 2024-03-06 DIAGNOSIS — K047 Periapical abscess without sinus: Secondary | ICD-10-CM

## 2024-03-12 ENCOUNTER — Ambulatory Visit (HOSPITAL_COMMUNITY)
Admission: RE | Admit: 2024-03-12 | Discharge: 2024-03-12 | Disposition: A | Discharge status: Home / Self Care | Source: Ambulatory Visit | Attending: Family | Admitting: Family

## 2024-03-12 DIAGNOSIS — C760 Malignant neoplasm of head, face and neck: Secondary | ICD-10-CM | POA: Diagnosis present

## 2024-03-12 DIAGNOSIS — R599 Enlarged lymph nodes, unspecified: Secondary | ICD-10-CM | POA: Diagnosis not present

## 2024-03-12 DIAGNOSIS — I725 Aneurysm of other precerebral arteries: Secondary | ICD-10-CM | POA: Diagnosis not present

## 2024-03-12 MED ORDER — IOHEXOL 300 MG/ML  SOLN
100.0000 mL | Freq: Once | INTRAMUSCULAR | Status: AC | PRN
  Administered 2024-03-12: 100 mL via INTRAVENOUS

## 2024-03-13 ENCOUNTER — Telehealth: Payer: Self-pay

## 2024-03-13 ENCOUNTER — Telehealth: Payer: Self-pay | Admitting: Family

## 2024-03-13 DIAGNOSIS — I729 Aneurysm of unspecified site: Secondary | ICD-10-CM

## 2024-03-13 NOTE — Telephone Encounter (Signed)
 Copied from CRM #[id number]. Topic: Clinical - Request for Lab/Test Order >> Mar 13, 2024 11:57 AM Chiquita SQUIBB wrote: Reason for CRM: Cherly with [facility] is calling in stating that Dr. Lester the radiologist needs to speak with Bari Learn regarding the patients Soft Tissue CT scan. Please contact [phone] and ask to speak with Dr. Lester.

## 2024-03-13 NOTE — Telephone Encounter (Unsigned)
 Copied from CRM [id number]. Topic: General - Other >> Mar 13, 2024  2:50 PM Santiya F wrote: Reason for CRM: Slater with [facility] is calling in because Dr. Lester needs to speak with Bari Learn regarding critical CT scan results. She says Dr. Lester has left the hospital and is requesting Bari call him before the end of the day. He can be reached at [phone]. She says it's urgent and he needs to speak with her today.

## 2024-03-14 NOTE — Telephone Encounter (Signed)
 Bari spoke with radiologist and put in the referrals. I called patient with no answer could not leave VM. Found sons number in chart he is on DPR left message for son to call us  back that it was high priority

## 2024-03-14 NOTE — Telephone Encounter (Signed)
 Son is aware and will reach out to his father

## 2024-03-14 NOTE — Telephone Encounter (Signed)
 Son Matthew [name] called back and was connected to the office. Edwardo took call)

## 2024-03-16 ENCOUNTER — Other Ambulatory Visit: Payer: Self-pay | Admitting: Family

## 2024-03-16 DIAGNOSIS — J441 Chronic obstructive pulmonary disease with (acute) exacerbation: Secondary | ICD-10-CM

## 2024-03-19 ENCOUNTER — Other Ambulatory Visit: Payer: Self-pay | Admitting: Family

## 2024-03-19 ENCOUNTER — Telehealth: Payer: Self-pay | Admitting: Radiation Oncology

## 2024-03-19 DIAGNOSIS — I10 Essential (primary) hypertension: Secondary | ICD-10-CM

## 2024-03-19 NOTE — Telephone Encounter (Signed)
 Gave MRR to Dosimetry team for MRR 03/19/24

## 2024-03-21 ENCOUNTER — Other Ambulatory Visit: Payer: Self-pay | Admitting: Family

## 2024-03-21 DIAGNOSIS — E785 Hyperlipidemia, unspecified: Secondary | ICD-10-CM

## 2024-03-25 ENCOUNTER — Ambulatory Visit: Admitting: Family
